# Patient Record
Sex: Female | Born: 1960
Health system: Southern US, Community
[De-identification: ages and names within clinical notes are randomized; demographics above are authoritative.]

## PROBLEM LIST (undated history)

## (undated) DIAGNOSIS — T7840XA Allergy, unspecified, initial encounter: Secondary | ICD-10-CM

## (undated) DIAGNOSIS — Z8739 Personal history of other diseases of the musculoskeletal system and connective tissue: Secondary | ICD-10-CM

## (undated) DIAGNOSIS — Z91018 Allergy to other foods: Secondary | ICD-10-CM

## (undated) DIAGNOSIS — D259 Leiomyoma of uterus, unspecified: Secondary | ICD-10-CM

## (undated) DIAGNOSIS — D649 Anemia, unspecified: Secondary | ICD-10-CM

## (undated) DIAGNOSIS — R1900 Intra-abdominal and pelvic swelling, mass and lump, unspecified site: Secondary | ICD-10-CM

## (undated) DIAGNOSIS — C541 Malignant neoplasm of endometrium: Secondary | ICD-10-CM

## (undated) DIAGNOSIS — R0602 Shortness of breath: Secondary | ICD-10-CM

## (undated) DIAGNOSIS — N838 Other noninflammatory disorders of ovary, fallopian tube and broad ligament: Secondary | ICD-10-CM

## (undated) HISTORY — DX: Leiomyoma of uterus, unspecified: D25.9

## (undated) HISTORY — DX: Allergy, unspecified, initial encounter: T78.40XA

## (undated) HISTORY — DX: Anemia, unspecified: D64.9

## (undated) HISTORY — DX: Allergy to other foods: Z91.018

## (undated) HISTORY — DX: Other noninflammatory disorders of ovary, fallopian tube and broad ligament: N83.8

## (undated) HISTORY — DX: Personal history of other diseases of the musculoskeletal system and connective tissue: Z87.39

## (undated) HISTORY — DX: Shortness of breath: R06.02

## (undated) HISTORY — DX: Intra-abdominal and pelvic swelling, mass and lump, unspecified site: R19.00

---

## 1998-12-26 HISTORY — PX: EYE SURGERY: SHX253

## 2006-06-15 ENCOUNTER — Ambulatory Visit: Payer: Self-pay | Admitting: Urology

## 2007-08-20 ENCOUNTER — Inpatient Hospital Stay: Payer: Self-pay | Admitting: Internal Medicine

## 2009-12-02 DIAGNOSIS — Z8041 Family history of malignant neoplasm of ovary: Secondary | ICD-10-CM | POA: Insufficient documentation

## 2009-12-08 ENCOUNTER — Ambulatory Visit: Payer: Self-pay | Admitting: Family Medicine

## 2009-12-09 DIAGNOSIS — N83209 Unspecified ovarian cyst, unspecified side: Secondary | ICD-10-CM | POA: Insufficient documentation

## 2009-12-09 DIAGNOSIS — D219 Benign neoplasm of connective and other soft tissue, unspecified: Secondary | ICD-10-CM | POA: Insufficient documentation

## 2009-12-16 ENCOUNTER — Ambulatory Visit: Payer: Self-pay | Admitting: Family Medicine

## 2009-12-30 DIAGNOSIS — I369 Nonrheumatic tricuspid valve disorder, unspecified: Secondary | ICD-10-CM | POA: Insufficient documentation

## 2010-01-06 DIAGNOSIS — I8310 Varicose veins of unspecified lower extremity with inflammation: Secondary | ICD-10-CM | POA: Insufficient documentation

## 2010-01-06 DIAGNOSIS — I872 Venous insufficiency (chronic) (peripheral): Secondary | ICD-10-CM | POA: Insufficient documentation

## 2010-03-02 DIAGNOSIS — J309 Allergic rhinitis, unspecified: Secondary | ICD-10-CM | POA: Insufficient documentation

## 2010-05-11 ENCOUNTER — Ambulatory Visit: Payer: Self-pay | Admitting: Family Medicine

## 2015-08-06 ENCOUNTER — Telehealth: Payer: Self-pay | Admitting: Family Medicine

## 2015-08-06 NOTE — Telephone Encounter (Signed)
Fine to schedule. Thanks

## 2015-08-06 NOTE — Telephone Encounter (Signed)
Pt called to get reestablished with our practice. Pt was with Dr. Reginia Forts and was last seen in office 03/02/10. Pt stated she hasn't been seen anywhere else then and has [facility] insurance. Thanks TNP

## 2015-11-24 ENCOUNTER — Encounter: Payer: Self-pay | Admitting: Family Medicine

## 2015-11-24 ENCOUNTER — Ambulatory Visit (INDEPENDENT_AMBULATORY_CARE_PROVIDER_SITE_OTHER): Payer: 59 | Admitting: Family Medicine

## 2015-11-24 VITALS — BP 120/80 | HR 84 | Temp 98.1°F | Resp 16 | Ht 69.0 in | Wt 263.0 lb

## 2015-11-24 DIAGNOSIS — J3089 Other allergic rhinitis: Secondary | ICD-10-CM | POA: Diagnosis not present

## 2015-11-24 DIAGNOSIS — Z889 Allergy status to unspecified drugs, medicaments and biological substances status: Secondary | ICD-10-CM

## 2015-11-24 DIAGNOSIS — IMO0002 Reserved for concepts with insufficient information to code with codable children: Secondary | ICD-10-CM | POA: Insufficient documentation

## 2015-11-24 DIAGNOSIS — I272 Other secondary pulmonary hypertension: Secondary | ICD-10-CM | POA: Diagnosis not present

## 2015-11-24 DIAGNOSIS — M1990 Unspecified osteoarthritis, unspecified site: Secondary | ICD-10-CM

## 2015-11-24 DIAGNOSIS — N920 Excessive and frequent menstruation with regular cycle: Secondary | ICD-10-CM | POA: Insufficient documentation

## 2015-11-24 DIAGNOSIS — I369 Nonrheumatic tricuspid valve disorder, unspecified: Secondary | ICD-10-CM | POA: Diagnosis not present

## 2015-11-24 DIAGNOSIS — Z9109 Other allergy status, other than to drugs and biological substances: Secondary | ICD-10-CM

## 2015-11-24 DIAGNOSIS — T783XXD Angioneurotic edema, subsequent encounter: Secondary | ICD-10-CM

## 2015-11-24 NOTE — Progress Notes (Signed)
Patient ID: Karen Mann, female   DOB: 1961/05/19, 54 y.o.   MRN: [id number]        [name]    DOB: [birth year]   54 y.o.   MRN: [id number] Visit Date: 11/24/2015  Today's Provider: Margarita Rana, MD   Chief Complaint  Patient presents with  . New Patient (Initial Visit)    restablish  . Shortness of Breath   Subjective:    HPI Dyspnea: Patient complains of shortness of breath after one flight stairs.  Symptoms include difficulty breathing and dyspnea on exertion. Symptoms began 2 years ago, unchanged since that time.  Patient denies chest pain. Associated symptoms include dyspnea. Patient has not had recent travel.   Symptoms are exacerbated by walking. Symptoms are alleviated by rest.   Patient reports she has been diagnosed with angioedema by derm Dr. Evorn Gong. Patient does han an appt on 11/26/15 at [facility] for allergy testing. Was positive for Alpha-gal. Can not eat red meat.    Does keep a tight chest.  Does also have allergy testing.   Has not been seen recently.      Allergies  Allergen Reactions  . Penicillins    Previous Medications   CETIRIZINE (ZYRTEC) 10 MG TABLET    Take 10 mg by mouth daily.   EPIPEN 2-PAK 0.3 MG/0.3ML SOAJ INJECTION    INJECT INTO THIGH MUSCLE AS NEEDED FOR ANAPHYLAXIS.    Review of Systems  Constitutional: Positive for fatigue.  HENT: Positive for congestion, facial swelling, hearing loss, sinus pressure and sore throat.   Respiratory: Positive for chest tightness and shortness of breath.   Cardiovascular: Positive for leg swelling.  Genitourinary: Positive for enuresis.  Musculoskeletal: Positive for back pain, joint swelling and arthralgias.  Skin: Positive for rash.  Allergic/Immunologic: Positive for food allergies.    Social History  Substance Use Topics  . Smoking status: Never Smoker   . Smokeless tobacco: Never Used  . Alcohol Use: No   Objective:   BP 120/80 mmHg  Pulse 84  Temp(Src) 98.1 F (36.7  C) (Oral)  Resp 16  Ht 5\' 9"  (1.753 m)  Wt 263 lb (119.296 kg)  BMI 38.82 kg/m2  SpO2 98%  LMP 11/23/2015  Physical Exam  Constitutional: She is oriented to person, place, and time. She appears well-developed and well-nourished.  HENT:  Head: Normocephalic and atraumatic.  Right Ear: External ear normal.  Left Ear: External ear normal.  Mouth/Throat: Oropharynx is clear and moist.  Eyes: Conjunctivae and EOM are normal. Pupils are equal, round, and reactive to light.  Neck: Normal range of motion. Neck supple.  Cardiovascular: Normal rate and regular rhythm.   Pulmonary/Chest: Effort normal and breath sounds normal.  Neurological: She is alert and oriented to person, place, and time.  Psychiatric: She has a normal mood and affect. Her behavior is normal. Judgment and thought content normal.        Assessment & Plan:     1. Secondary pulmonary hypertension ([place]) Has not been checked recently. Will refer to cardiology secondary to SOB, etc.  Will also check labs.   - Comprehensive metabolic panel - Lipid Panel With LDL/HDL Ratio - TSH - Ambulatory referral to Cardiology  2. Other allergic rhinitis Is following up with allergist to have testing and see if can help SOB. Off medication currently.  - CBC with Differential/Platelet  3. Nonrheumatic tricuspid valve disorder Refer as noted.  - Ambulatory referral to Cardiology  4. Angioedema, subsequent encounter  Is continuing work up.   5. Multiple allergies Follow up with allergist as scheduled.   6. Osteoarthritis, unspecified osteoarthritis type, unspecified site Will address after above.         Karen Rana, MD  [facility]

## 2015-11-28 LAB — CBC WITH DIFFERENTIAL/PLATELET
BASOS ABS: 0.1 10*3/uL (ref 0.0–0.2)
Basos: 1 %
EOS (ABSOLUTE): 0.5 10*3/uL — ABNORMAL HIGH (ref 0.0–0.4)
EOS: 6 %
HEMATOCRIT: 34.1 % (ref 34.0–46.6)
HEMOGLOBIN: 11.6 g/dL (ref 11.1–15.9)
IMMATURE GRANS (ABS): 0 10*3/uL (ref 0.0–0.1)
IMMATURE GRANULOCYTES: 0 %
LYMPHS ABS: 2.3 10*3/uL (ref 0.7–3.1)
LYMPHS: 31 %
MCH: 26.6 pg (ref 26.6–33.0)
MCHC: 34 g/dL (ref 31.5–35.7)
MCV: 78 fL — ABNORMAL LOW (ref 79–97)
MONOCYTES: 8 %
Monocytes Absolute: 0.6 10*3/uL (ref 0.1–0.9)
Neutrophils Absolute: 4 10*3/uL (ref 1.4–7.0)
Neutrophils: 54 %
Platelets: 379 10*3/uL (ref 150–379)
RBC: 4.36 x10E6/uL (ref 3.77–5.28)
RDW: 13.5 % (ref 12.3–15.4)
WBC: 7.4 10*3/uL (ref 3.4–10.8)

## 2015-11-28 LAB — LIPID PANEL WITH LDL/HDL RATIO
CHOLESTEROL TOTAL: 207 mg/dL — AB (ref 100–199)
HDL: 66 mg/dL (ref >39)
LDL CALC: 123 mg/dL — AB (ref 0–99)
LDL/HDL RATIO: 1.9 ratio (ref 0.0–3.2)
Triglycerides: 92 mg/dL (ref 0–149)
VLDL CHOLESTEROL CAL: 18 mg/dL (ref 5–40)

## 2015-11-28 LAB — COMPREHENSIVE METABOLIC PANEL
ALBUMIN: 4.2 g/dL (ref 3.5–5.5)
ALK PHOS: 64 IU/L (ref 39–117)
ALT: 20 IU/L (ref 0–32)
AST: 19 IU/L (ref 0–40)
Albumin/Globulin Ratio: 1.6 (ref 1.1–2.5)
BUN / CREAT RATIO: 13 (ref 9–23)
BUN: 12 mg/dL (ref 6–24)
Bilirubin Total: 0.4 mg/dL (ref 0.0–1.2)
CALCIUM: 9.2 mg/dL (ref 8.7–10.2)
CO2: 24 mmol/L (ref 18–29)
CREATININE: 0.91 mg/dL (ref 0.57–1.00)
Chloride: 101 mmol/L (ref 97–106)
GFR calc Af Amer: 83 mL/min/{1.73_m2} (ref >59)
GFR, EST NON AFRICAN AMERICAN: 72 mL/min/{1.73_m2} (ref >59)
GLOBULIN, TOTAL: 2.6 g/dL (ref 1.5–4.5)
GLUCOSE: 81 mg/dL (ref 65–99)
Potassium: 4.6 mmol/L (ref 3.5–5.2)
SODIUM: 141 mmol/L (ref 136–144)
TOTAL PROTEIN: 6.8 g/dL (ref 6.0–8.5)

## 2015-11-28 LAB — TSH: TSH: 1.53 u[IU]/mL (ref 0.450–4.500)

## 2015-11-30 ENCOUNTER — Telehealth: Payer: Self-pay

## 2015-11-30 DIAGNOSIS — M1990 Unspecified osteoarthritis, unspecified site: Secondary | ICD-10-CM

## 2015-11-30 NOTE — Telephone Encounter (Signed)
Pt advised as directed below.  She reported that she "Googled" her symptoms and is worried about Lupus or Lyme's disease.  She is wanted to know if you would agree to ordering labs to check for that.  Her contact Number is [phone].  Thanks,   -Mickel Baas

## 2015-11-30 NOTE — Telephone Encounter (Signed)
LMTCB Emily Drozdowski, CMA  

## 2015-11-30 NOTE — Telephone Encounter (Signed)
-----   Message from Margarita Rana, MD sent at 11/28/2015  7:58 AM EST ----- Labs stable.  Cholesterol is mildly elevated but has high good cholesterol. Blood sugar ok. Follow up with cardiology as discussed. Thanks.

## 2015-11-30 NOTE — Telephone Encounter (Signed)
Ok to order ANA, lyme titers and sed rate. Thanks.

## 2015-11-30 NOTE — Telephone Encounter (Signed)
Pt returned call and request a call back at work. Work # is in Psychologist, counselling. Thanks TNP

## 2015-12-01 DIAGNOSIS — M1990 Unspecified osteoarthritis, unspecified site: Secondary | ICD-10-CM | POA: Insufficient documentation

## 2015-12-01 NOTE — Telephone Encounter (Signed)
Left message to call back  

## 2015-12-01 NOTE — Telephone Encounter (Signed)
Dr. Jerilynn Mages for the Lyme titer, should it say Western Blot? Please advise. Thanks!

## 2015-12-01 NOTE — Telephone Encounter (Signed)
Printed.  Please notify patient. Thanks.  

## 2015-12-03 DIAGNOSIS — R0681 Apnea, not elsewhere classified: Secondary | ICD-10-CM | POA: Insufficient documentation

## 2015-12-03 DIAGNOSIS — R079 Chest pain, unspecified: Secondary | ICD-10-CM | POA: Insufficient documentation

## 2015-12-14 ENCOUNTER — Telehealth: Payer: Self-pay | Admitting: Family Medicine

## 2015-12-14 NOTE — Telephone Encounter (Signed)
Pt is asking if the [facility] insurance form has been faxed back to [facility] showing lab results, weight and blood pressure.  CB#[phone]/MW

## 2015-12-15 NOTE — Telephone Encounter (Signed)
Form was faxed 12/15/2015  Thanks,   -Mickel Baas

## 2015-12-31 ENCOUNTER — Encounter: Payer: Self-pay | Admitting: Family Medicine

## 2015-12-31 ENCOUNTER — Ambulatory Visit (INDEPENDENT_AMBULATORY_CARE_PROVIDER_SITE_OTHER): Payer: 59 | Admitting: Family Medicine

## 2015-12-31 VITALS — BP 120/90 | HR 72 | Temp 98.3°F | Resp 16 | Ht 69.0 in | Wt 262.0 lb

## 2015-12-31 DIAGNOSIS — Z1211 Encounter for screening for malignant neoplasm of colon: Secondary | ICD-10-CM

## 2015-12-31 DIAGNOSIS — R0609 Other forms of dyspnea: Secondary | ICD-10-CM | POA: Diagnosis not present

## 2015-12-31 DIAGNOSIS — Z124 Encounter for screening for malignant neoplasm of cervix: Secondary | ICD-10-CM | POA: Diagnosis not present

## 2015-12-31 DIAGNOSIS — Z0000 Encounter for general adult medical examination without abnormal findings: Secondary | ICD-10-CM

## 2015-12-31 DIAGNOSIS — J3089 Other allergic rhinitis: Secondary | ICD-10-CM | POA: Diagnosis not present

## 2015-12-31 DIAGNOSIS — R0681 Apnea, not elsewhere classified: Secondary | ICD-10-CM

## 2015-12-31 DIAGNOSIS — Z1239 Encounter for other screening for malignant neoplasm of breast: Secondary | ICD-10-CM

## 2015-12-31 MED ORDER — MONTELUKAST SODIUM 10 MG PO TABS
10.0000 mg | ORAL_TABLET | Freq: Every day | ORAL | Status: DC
Start: 2015-12-31 — End: 2018-07-17

## 2015-12-31 NOTE — Patient Instructions (Signed)
Please call the [facility] at [facility] to schedule this at [phone]   

## 2015-12-31 NOTE — Progress Notes (Signed)
Patient ID: ULLA DYSINGER, female   DOB: 08/21/1961, 55 y.o.   MRN: [id number]        Patient: VALI ALDERFER, Female    DOB: 1961/12/01, 55 y.o.   MRN: [id number] Visit Date: 12/31/2015  Today's Provider: Margarita Rana, MD   Chief Complaint  Patient presents with  . Annual Exam   Subjective:    Annual physical exam Takia Montel Sande is a 55 y.o. female who presents today for health maintenance and complete physical. She feels well. She reports exercising 3 days a week. She reports she is sleeping well.  11/30/09 Pap-neg; HPV-neg 12/16/09 Mammogram-BI-RADS 2  Lab Results  Component Value Date   WBC 7.4 11/27/2015   HCT 34.1 11/27/2015   GLUCOSE 81 11/27/2015   CHOL 207* 11/27/2015   TRIG 92 11/27/2015   HDL 66 11/27/2015   LDLCALC 123* 11/27/2015   ALT 20 11/27/2015   AST 19 11/27/2015   NA 141 11/27/2015   K 4.6 11/27/2015   CL 101 11/27/2015   CREATININE 0.91 11/27/2015   BUN 12 11/27/2015   CO2 24 11/27/2015   TSH 1.530 11/27/2015   Still with SOB but improved some.  Did go to see cardiology, and pulmonary hypertension is stable.   Is not the cause of SOB. Does take Zyrtec twice a day.   Has had some recurrence of angioedema.  Etiology unclear still. Has been avoiding red meat. Has increased her cardio at the gym and thinks this has helped.   Does still get tightness in her chest at times.  Still with some runny nose also.   -----------------------------------------------------------------   Review of Systems  Constitutional: Negative.   HENT: Positive for facial swelling and rhinorrhea.   Eyes: Negative.   Respiratory: Positive for chest tightness and shortness of breath.   Cardiovascular: Negative.   Gastrointestinal: Negative.   Endocrine: Negative.   Genitourinary: Positive for urgency and enuresis.  Musculoskeletal: Positive for back pain, joint swelling, arthralgias and gait problem.  Skin: Positive for rash.  Allergic/Immunologic: Positive for food  allergies.  Neurological: Negative.   Hematological: Negative.   Psychiatric/Behavioral: Negative.     Social History      She  reports that she has never smoked. She has never used smokeless tobacco. She reports that she does not drink alcohol or use illicit drugs.       Social History   Social History  . Marital Status: Married    Spouse Name: N/A  . Number of Children: N/A  . Years of Education: N/A   Social History Main Topics  . Smoking status: Never Smoker   . Smokeless tobacco: Never Used  . Alcohol Use: No  . Drug Use: No  . Sexual Activity: Not Asked   Other Topics Concern  . None   Social History Narrative    Past Medical History  Diagnosis Date  . Allergy      Patient Active Problem List   Diagnosis Date Noted  . Chest pain 12/03/2015  . Breathlessness on exertion 12/03/2015  . Arthritis 12/01/2015  . Excessive or frequent menstruation 11/24/2015  . Secondary pulmonary hypertension ([place]) 11/24/2015  . Allergic rhinitis 03/02/2010  . Varicose veins of lower extremities with inflammation 01/06/2010  . Chronic venous insufficiency 01/06/2010  . Nonrheumatic tricuspid valve disorder 12/30/2009  . Cyst of ovary 12/09/2009  . Fibroid 12/09/2009  . Family history of malignant neoplasm of ovary 12/02/2009    Past Surgical History  Procedure Laterality Date  .  Eye surgery  2000    Lasik    Family History        Family Status  Relation Status Death Age  . Father Alive   . Mother Deceased 30    ovarian cancer  . Brother Alive         Her family history includes Healthy in her brother; Hypertension in her father.    Allergies  Allergen Reactions  . Penicillins     Previous Medications   CETIRIZINE (ZYRTEC) 10 MG TABLET    Take 10 mg by mouth 2 (two) times daily.    EPIPEN 2-PAK 0.3 MG/0.3ML SOAJ INJECTION    INJECT INTO THIGH MUSCLE AS NEEDED FOR ANAPHYLAXIS.    Patient Care Team: Margarita Rana, MD as PCP - General (Family Medicine)      Objective:   Vitals: BP 120/90 mmHg  Pulse 72  Temp(Src) 98.3 F (36.8 C) (Oral)  Resp 16  Ht 5\' 9"  (1.753 m)  Wt 262 lb (118.842 kg)  BMI 38.67 kg/m2  LMP 12/15/2015 (Approximate)   Physical Exam  Constitutional: She is oriented to person, place, and time. She appears well-developed and well-nourished.  HENT:  Head: Normocephalic and atraumatic.  Right Ear: Tympanic membrane, external ear and ear canal normal.  Left Ear: Tympanic membrane, external ear and ear canal normal.  Nose: Mucosal edema and rhinorrhea present.  Mouth/Throat: Uvula is midline, oropharynx is clear and moist and mucous membranes are normal.  Eyes: Conjunctivae, EOM and lids are normal. Pupils are equal, round, and reactive to light.  Neck: Trachea normal and normal range of motion. Neck supple. Carotid bruit is not present. No thyroid mass and no thyromegaly present.  Cardiovascular: Normal rate, regular rhythm and normal heart sounds.   Pulmonary/Chest: Effort normal and breath sounds normal.  Abdominal: Soft. Normal appearance and bowel sounds are normal. There is no hepatosplenomegaly. There is no tenderness.  Genitourinary: Uterus normal. No breast swelling, tenderness or discharge. Vaginal discharge found.  Musculoskeletal: Normal range of motion.  Lymphadenopathy:    She has no cervical adenopathy.    She has no axillary adenopathy.  Neurological: She is alert and oriented to person, place, and time. She has normal strength. No cranial nerve deficit.  Skin: Skin is warm, dry and intact.  Psychiatric: She has a normal mood and affect. Her speech is normal and behavior is normal. Judgment and thought content normal. Cognition and memory are normal.     Depression Screen PHQ 2/9 Scores 12/31/2015  PHQ - 2 Score 0      Assessment & Plan:     Routine Health Maintenance and Physical Exam  Exercise Activities and Dietary recommendations Goals    . Exercise 150 minutes per week (moderate  activity)        There is no immunization history on file for this patient.   1. Annual physical exam As above. Encouraged eating healthy and continue to increase exercise.    2. Breast cancer screening Patient will call.  - MM DIGITAL SCREENING BILATERAL; Future  3. Colon cancer screening Will schedule.  - Ambulatory referral to Gastroenterology  4. Cervical cancer screening Done today. Some slight bleeding after PAP. - Pap IG and HPV (high risk) DNA detection  5. Other allergic rhinitis Improved, but not quite at goal. Will try addition of Montelukast.   Further plan pending these changes. Patient will call with progress.   - montelukast (SINGULAIR) 10 MG tablet; Take 1 tablet (10 mg total) by mouth  at bedtime.  Dispense: 30 tablet; Refill: 3  6. Breathlessness on exertion Unclear if asthma, allergies,  Deconditioning or other process.   Will Treat as above and further plan as needed.   Patient was seen and examined by Jerrell Belfast, MD, and note scribed by Lynford Humphrey, [place].   I have reviewed the document for accuracy and completeness and I agree with above. Jerrell Belfast, MD   Margarita Rana, MD   --------------------------------------------------------------------

## 2016-01-06 ENCOUNTER — Telehealth: Payer: Self-pay

## 2016-01-06 LAB — PAP IG AND HPV HIGH-RISK
HPV, high-risk: NEGATIVE
PAP Smear Comment: 0

## 2016-01-06 NOTE — Telephone Encounter (Signed)
LMTCB Emily Drozdowski, CMA  

## 2016-01-06 NOTE — Telephone Encounter (Signed)
-----   Message from Margarita Rana, MD sent at 01/06/2016  1:44 PM EST ----- Pap is normal. Please notify patient.

## 2016-01-07 NOTE — Telephone Encounter (Signed)
Patient advised as below. sd 

## 2017-12-26 DIAGNOSIS — Z9221 Personal history of antineoplastic chemotherapy: Secondary | ICD-10-CM

## 2017-12-26 HISTORY — DX: Personal history of antineoplastic chemotherapy: Z92.21

## 2018-07-17 ENCOUNTER — Ambulatory Visit (INDEPENDENT_AMBULATORY_CARE_PROVIDER_SITE_OTHER): Payer: 59 | Admitting: Family Medicine

## 2018-07-17 ENCOUNTER — Encounter: Payer: Self-pay | Admitting: Family Medicine

## 2018-07-17 VITALS — BP 120/86 | HR 94 | Temp 97.9°F | Resp 16 | Wt 234.0 lb

## 2018-07-17 DIAGNOSIS — R1084 Generalized abdominal pain: Secondary | ICD-10-CM

## 2018-07-17 DIAGNOSIS — D219 Benign neoplasm of connective and other soft tissue, unspecified: Secondary | ICD-10-CM

## 2018-07-17 DIAGNOSIS — Z1211 Encounter for screening for malignant neoplasm of colon: Secondary | ICD-10-CM

## 2018-07-17 DIAGNOSIS — N939 Abnormal uterine and vaginal bleeding, unspecified: Secondary | ICD-10-CM

## 2018-07-17 DIAGNOSIS — D649 Anemia, unspecified: Secondary | ICD-10-CM | POA: Diagnosis not present

## 2018-07-17 MED ORDER — NAPROXEN 500 MG PO TABS: 500.0000 mg | ORAL_TABLET | Freq: Two times a day (BID) | ORAL | 2 refills | Status: DC

## 2018-07-17 NOTE — Patient Instructions (Signed)
Abnormal Uterine Bleeding Abnormal uterine bleeding can affect women at various stages in life, including teenagers, women in their reproductive years, pregnant women, and women who have reached menopause. Several kinds of uterine bleeding are considered abnormal, including:  Bleeding or spotting between periods.  Bleeding after sexual intercourse.  Bleeding that is heavier or more than normal.  Periods that last longer than usual.  Bleeding after menopause. Many cases of abnormal uterine bleeding are minor and simple to treat, while others are more serious. Any type of abnormal bleeding should be evaluated by your health care provider. Treatment will depend on the cause of the bleeding. Follow these instructions at home: Monitor your condition for any changes. The following actions may help to alleviate any discomfort you are experiencing:  Avoid the use of tampons and douches as directed by your health care provider.  Change your pads frequently. You should get regular pelvic exams and Pap tests. Keep all follow-up appointments for diagnostic tests as directed by your health care provider. Contact a health care provider if:  Your bleeding lasts more than 1 week.  You feel dizzy at times. Get help right away if:  You pass out.  You are changing pads every 15 to 30 minutes.  You have abdominal pain.  You have a fever.  You become sweaty or weak.  You are passing large blood clots from the vagina.  You start to feel nauseous and vomit. This information is not intended to replace advice given to you by your health care provider. Make sure you discuss any questions you have with your health care provider. Document Released: 12/12/2005 Document Revised: 05/25/2016 Document Reviewed: 07/11/2013 Elsevier Interactive Patient Education  2017 Elsevier Inc.  

## 2018-07-17 NOTE — Progress Notes (Signed)
[name]    DOB: [birth year]   57 y.o.   MRN: [id number] Visit Date: 07/17/2018  Today's Provider: Lavon Paganini, MD   I, Martha Clan, CMA, am acting as scribe for Lavon Paganini, MD.  Chief Complaint  Patient presents with  . Abdominal Pain   Subjective:    Abdominal Pain  This is a new problem. Episode onset: x 1 week. The pain is located in the generalized abdominal region. The quality of the pain is aching. The abdominal pain radiates to the back. Associated symptoms include anorexia. Pertinent negatives include no arthralgias, belching, constipation, diarrhea, dysuria, fever, flatus, frequency, headaches, hematochezia, hematuria, melena, myalgias, nausea or vomiting. Associated symptoms comments: Vaginal bleeding has been present daily for several months, ranging from light to passing clots.. The pain is aggravated by movement. The pain is relieved by nothing. Treatments tried: Milk of Magnesia. The treatment provided no relief.   Bothered by tightness of abdomen and tenderness diffusely. Really crampy yesterday. Had to leave training session due to cramping.  It is less today.    When she tried milk of magnesia earlier in the week, she did have several bowel movements which gave her little relief, but the pain came right back.  She has had vaginal bleeding for about 6 months.  She rarely has a day where she has no bleeding.  She did not see anyone for this problem as she believes it was her going through menopause.  She does not believe that she has gone a full year without periods prior to this.  She states that the bleeding varies and some days she only used 3 tampons, but other days she will need to change it every hour and have blood clots present.  Previously, she had regular periods every 28 to 30 days a lasted 4 to 5 days at a time.  Earlier this year, she did go few months without any vaginal bleeding.     Allergies  Allergen Reactions  .  Penicillins     No current outpatient medications on file.  Review of Systems  Constitutional: Positive for fatigue. Negative for fever.  HENT: Negative.   Respiratory: Negative.   Cardiovascular: Negative.   Gastrointestinal: Positive for abdominal pain and anorexia. Negative for constipation, diarrhea, flatus, hematochezia, melena, nausea and vomiting.  Genitourinary: Positive for vaginal bleeding. Negative for dysuria, frequency and hematuria.  Musculoskeletal: Negative for arthralgias and myalgias.  Skin: Negative.   Neurological: Negative.  Negative for headaches.  Hematological: Negative.   Psychiatric/Behavioral: Negative.     Social History   Tobacco Use  . Smoking status: Never Smoker  . Smokeless tobacco: Never Used  Substance Use Topics  . Alcohol use: No   Objective:   BP 120/86 (BP Location: Left Arm, Patient Position: Sitting, Cuff Size: Large)   Pulse 94   Temp 97.9 F (36.6 C) (Oral)   Resp 16   Wt 234 lb (106.1 kg)   SpO2 99%   BMI 34.56 kg/m  Vitals:   07/17/18 1041  BP: 120/86  Pulse: 94  Resp: 16  Temp: 97.9 F (36.6 C)  TempSrc: Oral  SpO2: 99%  Weight: 234 lb (106.1 kg)     Physical Exam  Constitutional: She is oriented to person, place, and time. She appears well-developed and well-nourished. She does not appear ill. No distress.  HENT:  Head: Normocephalic and atraumatic.  Mouth/Throat: Oropharynx is clear and moist.  Eyes: Pupils are equal, round,  and reactive to light. EOM are normal. No scleral icterus.  Cardiovascular: Normal rate, regular rhythm, normal heart sounds and intact distal pulses.  No murmur heard. Pulmonary/Chest: Effort normal and breath sounds normal. No respiratory distress. She has no wheezes. She has no rales.  Abdominal: Soft. Normal appearance and bowel sounds are normal. She exhibits no ascites. There is tenderness in the right lower quadrant, suprapubic area and left lower quadrant. There is no rigidity, no  guarding and no CVA tenderness.  Uterus palpable with fundus approx at umbilicus, firm  Genitourinary: Uterus is enlarged.  Genitourinary Comments: Patient declines GYN exam today but understands that she will have to have exam at GYN  Neurological: She is alert and oriented to person, place, and time.  Skin: Skin is warm and dry. Capillary refill takes less than 2 seconds. No rash noted.  Psychiatric: She has a normal mood and affect. Her behavior is normal.  Vitals reviewed.      Assessment & Plan:   Problem List Items Addressed This Visit      Genitourinary   Abnormal uterine bleeding - Primary    Patient with significant vaginal bleeding x6 months She is of an age that she should likely be postmenopausal We will check TSH, CBC, FSH, LH Suspect this is most likely related to fibroids given that her uterus is enlarged and firm at about 20 weeks size as well as known history of fibroids Check ultrasound to characterize further Referral to gynecology for further management      Relevant Orders   CBC (Completed)   Comprehensive metabolic panel (Completed)   FSH/LH (Completed)   TSH (Completed)   US Transvaginal Non-OB   US Pelvis Complete   Ambulatory referral to Gynecology     Other   Fibroid    Review of records reveals fibroids noted in 2010 Ultrasound in 2011 showed 2 uterine fibroids with the largest measuring about 4 cm Given the size of her uterus on exam today, suspect these have enlarged and are causing her abnormal uterine bleeding as well as abdominal pain and cramping Check ultrasound and referral to gynecology as above      Generalized abdominal pain    Suspect this is related to her enlarging uterus Could have been somewhat related to constipation as it did relieve somewhat with milk of magnesia As above, we will evaluate further with ultrasound and refer to gynecology for further management of her fibroids Discussed return precautions      Relevant  Orders   CBC (Completed)   Comprehensive metabolic panel (Completed)   FSH/LH (Completed)   TSH (Completed)   US Transvaginal Non-OB   US Pelvis Complete   Ambulatory referral to Gynecology    Other Visit Diagnoses    Colon cancer screening       Relevant Orders   Cologuard       Return if symptoms worsen or fail to improve.   The entirety of the information documented in the History of Present Illness, Review of Systems and Physical Exam were personally obtained by me. Portions of this information were initially documented by Raquel Sarna Ratchford, CMA and reviewed by me for thoroughness and accuracy.    Virginia Crews, MD, MPH [facility] 07/18/2018 10:33 AM

## 2018-07-18 ENCOUNTER — Telehealth: Payer: Self-pay | Admitting: Obstetrics and Gynecology

## 2018-07-18 DIAGNOSIS — N939 Abnormal uterine and vaginal bleeding, unspecified: Secondary | ICD-10-CM | POA: Insufficient documentation

## 2018-07-18 DIAGNOSIS — R1084 Generalized abdominal pain: Secondary | ICD-10-CM | POA: Insufficient documentation

## 2018-07-18 LAB — COMPREHENSIVE METABOLIC PANEL
A/G RATIO: 1.4 (ref 1.2–2.2)
ALK PHOS: 59 IU/L (ref 39–117)
ALT: 7 IU/L (ref 0–32)
AST: 21 IU/L (ref 0–40)
Albumin: 3.8 g/dL (ref 3.5–5.5)
BILIRUBIN TOTAL: 0.4 mg/dL (ref 0.0–1.2)
BUN/Creatinine Ratio: 9 (ref 9–23)
BUN: 7 mg/dL (ref 6–24)
CALCIUM: 8.8 mg/dL (ref 8.7–10.2)
CHLORIDE: 99 mmol/L (ref 96–106)
CO2: 20 mmol/L (ref 20–29)
Creatinine, Ser: 0.78 mg/dL (ref 0.57–1.00)
GFR calc Af Amer: 98 mL/min/{1.73_m2} (ref >59)
GFR calc non Af Amer: 85 mL/min/{1.73_m2} (ref >59)
Globulin, Total: 2.7 g/dL (ref 1.5–4.5)
Glucose: 75 mg/dL (ref 65–99)
Potassium: 4.4 mmol/L (ref 3.5–5.2)
SODIUM: 137 mmol/L (ref 134–144)
Total Protein: 6.5 g/dL (ref 6.0–8.5)

## 2018-07-18 LAB — FSH/LH
FSH: 6.4 m[IU]/mL
LH: 5.8 m[IU]/mL

## 2018-07-18 LAB — CBC
Hematocrit: 29.2 % — ABNORMAL LOW (ref 34.0–46.6)
Hemoglobin: 8.7 g/dL — ABNORMAL LOW (ref 11.1–15.9)
MCH: 22.1 pg — ABNORMAL LOW (ref 26.6–33.0)
MCHC: 29.8 g/dL — AB (ref 31.5–35.7)
MCV: 74 fL — AB (ref 79–97)
PLATELETS: 575 10*3/uL — AB (ref 150–450)
RBC: 3.93 x10E6/uL (ref 3.77–5.28)
RDW: 16.2 % — AB (ref 12.3–15.4)
WBC: 12 10*3/uL — ABNORMAL HIGH (ref 3.4–10.8)

## 2018-07-18 LAB — TSH: TSH: 1.89 u[IU]/mL (ref 0.450–4.500)

## 2018-07-18 NOTE — Telephone Encounter (Signed)
Patient is schedule 08/02/18 with Dr. Schuman °

## 2018-07-18 NOTE — Telephone Encounter (Signed)
[facility] referring for Abnormal uterine bleeding ,generalized abdominal pain. Prefers female MD. Hulen Skains and left voicemail for patient to call back to be schedule.

## 2018-07-18 NOTE — Assessment & Plan Note (Signed)
Review of records reveals fibroids noted in 2010 Ultrasound in 2011 showed 2 uterine fibroids with the largest measuring about 4 cm Given the size of her uterus on exam today, suspect these have enlarged and are causing her abnormal uterine bleeding as well as abdominal pain and cramping Check ultrasound and referral to gynecology as above

## 2018-07-18 NOTE — Assessment & Plan Note (Signed)
Suspect this is related to her enlarging uterus Could have been somewhat related to constipation as it did relieve somewhat with milk of magnesia As above, we will evaluate further with ultrasound and refer to gynecology for further management of her fibroids Discussed return precautions

## 2018-07-18 NOTE — Assessment & Plan Note (Signed)
Patient with significant vaginal bleeding x6 months She is of an age that she should likely be postmenopausal We will check TSH, CBC, FSH, LH Suspect this is most likely related to fibroids given that her uterus is enlarged and firm at about 20 weeks size as well as known history of fibroids Check ultrasound to characterize further Referral to gynecology for further management

## 2018-07-19 ENCOUNTER — Telehealth: Payer: Self-pay

## 2018-07-19 NOTE — Telephone Encounter (Signed)
-----   Message from Virginia Crews, MD sent at 07/18/2018 11:45 AM EDT ----- Patient's hemoglobin has decreased significantly since last checked.  This is consistent with likely iron deficiency anemia related to her chronic blood loss.  (Please add on an iron panel).  Normal blood sugar, kidney function, liver function, electrolytes, thyroid function.  Hormone levels are not consistent with being postmenopausal.  Brita Romp Dionne Bucy, MD, MPH [facility] 07/18/2018 11:45 AM

## 2018-07-19 NOTE — Telephone Encounter (Signed)
Left message advising pt. OK per DPR. [place] to add on iron panel.

## 2018-07-20 ENCOUNTER — Telehealth: Payer: Self-pay | Admitting: Family Medicine

## 2018-07-20 ENCOUNTER — Telehealth: Payer: Self-pay

## 2018-07-20 DIAGNOSIS — D649 Anemia, unspecified: Secondary | ICD-10-CM

## 2018-07-20 HISTORY — DX: Anemia, unspecified: D64.9

## 2018-07-20 NOTE — Telephone Encounter (Signed)
Left message advising pt. OK per DPR. 

## 2018-07-20 NOTE — Telephone Encounter (Signed)
-----   Message from Virginia Crews, MD sent at 07/20/2018 11:29 AM EDT ----- Iron panel confirms that anemia was due to iron deficiency.  This is related to chronic blood loss.  Patient should start taking ferrous sulfate 325 mg daily.  Iron supplement can cause constipation.  This is best taken with a stool softener such as Colace.  Iron supplement can also cause dark discoloration of the stool.  Do not be alarmed by this.  Virginia Crews, MD, MPH [facility] 07/20/2018 11:29 AM

## 2018-07-20 NOTE — Telephone Encounter (Signed)
Pt stated she was returning Emily's call about her results and request call back. Please advise. Thanks TNP

## 2018-07-21 LAB — IRON AND TIBC
Iron Saturation: 4 % — CL (ref 15–55)
Iron: 11 ug/dL — ABNORMAL LOW (ref 27–159)
Total Iron Binding Capacity: 312 ug/dL (ref 250–450)
UIBC: 301 ug/dL (ref 131–425)

## 2018-07-21 LAB — SPECIMEN STATUS REPORT

## 2018-07-21 LAB — FERRITIN: Ferritin: 60 ng/mL (ref 15–150)

## 2018-07-23 ENCOUNTER — Ambulatory Visit
Admission: RE | Admit: 2018-07-23 | Discharge: 2018-07-23 | Disposition: A | Discharge status: Home / Self Care | Payer: 59 | Source: Ambulatory Visit | Attending: Family Medicine | Admitting: Family Medicine

## 2018-07-23 DIAGNOSIS — R1900 Intra-abdominal and pelvic swelling, mass and lump, unspecified site: Secondary | ICD-10-CM | POA: Insufficient documentation

## 2018-07-23 DIAGNOSIS — D251 Intramural leiomyoma of uterus: Secondary | ICD-10-CM | POA: Diagnosis not present

## 2018-07-23 DIAGNOSIS — N939 Abnormal uterine and vaginal bleeding, unspecified: Secondary | ICD-10-CM | POA: Diagnosis not present

## 2018-07-23 DIAGNOSIS — R1084 Generalized abdominal pain: Secondary | ICD-10-CM | POA: Diagnosis not present

## 2018-07-24 ENCOUNTER — Other Ambulatory Visit: Payer: Self-pay | Admitting: Family Medicine

## 2018-07-24 DIAGNOSIS — R1900 Intra-abdominal and pelvic swelling, mass and lump, unspecified site: Secondary | ICD-10-CM

## 2018-07-24 DIAGNOSIS — N838 Other noninflammatory disorders of ovary, fallopian tube and broad ligament: Secondary | ICD-10-CM

## 2018-07-24 DIAGNOSIS — D259 Leiomyoma of uterus, unspecified: Secondary | ICD-10-CM

## 2018-07-24 DIAGNOSIS — N949 Unspecified condition associated with female genital organs and menstrual cycle: Secondary | ICD-10-CM

## 2018-07-24 DIAGNOSIS — N939 Abnormal uterine and vaginal bleeding, unspecified: Secondary | ICD-10-CM

## 2018-07-24 HISTORY — DX: Leiomyoma of uterus, unspecified: D25.9

## 2018-07-24 HISTORY — DX: Other noninflammatory disorders of ovary, fallopian tube and broad ligament: N83.8

## 2018-07-24 NOTE — Telephone Encounter (Signed)
Dr. B spoke with patient.

## 2018-07-26 ENCOUNTER — Telehealth: Payer: Self-pay | Admitting: Family Medicine

## 2018-07-26 DIAGNOSIS — C541 Malignant neoplasm of endometrium: Secondary | ICD-10-CM

## 2018-07-26 HISTORY — DX: Malignant neoplasm of endometrium: C54.1

## 2018-07-26 MED ORDER — ROPINIROLE HCL 0.25 MG PO TABS: 0.2500 mg | ORAL_TABLET | Freq: Every day | ORAL | 3 refills | Status: DC

## 2018-07-26 NOTE — Telephone Encounter (Signed)
Pt would like Dr. B to call her back/.  She has thought of some questions since she talked to you about her results earlier.  Pt's call back is [phone]  Thanks teri

## 2018-07-26 NOTE — Telephone Encounter (Signed)
Please review

## 2018-07-26 NOTE — Telephone Encounter (Signed)
Patient calls with a few questions about her recent finding on imaging.  She states she was caught off-guard and just thought of some questions  1. She has appts with GYN and GYN onc in the next few weeks 2. She has not heard about scheduling of the MRI - I will check with referral coordinator about this 3. She wonders how long it will take for her to feel better while taking iron - suspect 4-6 weeks 4. She is getting very constipated while taking iron supplement despite taking colace 4 times daily - start Miralax daily and titrate to 1 soft BM daily 5. She is struggling with RLS syndrome for 1-2 hours each night while in bed - start requip 0.25mg  qhs 1-3 hours before bedtime.  Suspect iron deficiency is causing these symptoms and may be able to stop requip once iron is repleted  Virginia Crews, MD, MPH [facility] 07/26/2018 4:07 PM

## 2018-08-02 ENCOUNTER — Encounter: Payer: Self-pay | Admitting: Obstetrics and Gynecology

## 2018-08-02 ENCOUNTER — Ambulatory Visit (INDEPENDENT_AMBULATORY_CARE_PROVIDER_SITE_OTHER): Payer: 59 | Admitting: Obstetrics and Gynecology

## 2018-08-02 ENCOUNTER — Other Ambulatory Visit (HOSPITAL_COMMUNITY)
Admission: RE | Admit: 2018-08-02 | Discharge: 2018-08-02 | Disposition: A | Discharge status: Home / Self Care | Payer: 59 | Source: Ambulatory Visit | Attending: Obstetrics and Gynecology | Admitting: Obstetrics and Gynecology

## 2018-08-02 VITALS — BP 130/76 | HR 90 | Ht 69.0 in | Wt 238.5 lb

## 2018-08-02 DIAGNOSIS — N839 Noninflammatory disorder of ovary, fallopian tube and broad ligament, unspecified: Secondary | ICD-10-CM

## 2018-08-02 DIAGNOSIS — C541 Malignant neoplasm of endometrium: Secondary | ICD-10-CM | POA: Diagnosis not present

## 2018-08-02 DIAGNOSIS — N838 Other noninflammatory disorders of ovary, fallopian tube and broad ligament: Secondary | ICD-10-CM

## 2018-08-02 DIAGNOSIS — R1900 Intra-abdominal and pelvic swelling, mass and lump, unspecified site: Secondary | ICD-10-CM | POA: Diagnosis not present

## 2018-08-02 NOTE — Progress Notes (Signed)
Patient ID: Karen Mann, female   DOB: March 15, 1961, 57 y.o.   MRN: [id number]  Reason for Consult: Refferal (x6 months bleeding, stopped for about a week then returned today )   Referred by Virginia Crews, MD  Subjective:     HPI:  Karen Mann is a 57 y.o. female.  [name] is following up today to establish care.  She was referred by her primary care physician because of a large abdominal mass that was seen on pelvic ultrasound.  The mass is suspicious for ovarian cancer.  [name] reports that her symptoms of this mass began in July.  She reports that on July 15 she had some bloating.  On July 20 she attended a painting class after which she was extremely fatigued.  She noticed some constipation and took milk of magnesia which did not help.  She saw her primary care physician on the 23rd and had her ultrasound performed on the 29th.  On the 30th her primary care physician called her about the ultrasound results.  The ultrasound showed a enlarged pelvic mass measuring 20 x 13 x 22 cm with cystic and solid components.  The ultrasound also showed a large 4 to 5 cm fibroid and thickened endometrium.  Past Medical History:  Diagnosis Date  . Allergy   . Anemia 07/20/2018  . Fibroid uterus 07/24/2018   5cm/2 inch   . Ovarian mass 07/24/2018   22cm/10 inch   Family History  Problem Relation Age of Onset  . Hypertension Father   . Healthy Brother    Past Surgical History:  Procedure Laterality Date  . EYE SURGERY  2000   Lasik    Short Social History:  Social History   Tobacco Use  . Smoking status: Never Smoker  . Smokeless tobacco: Never Used  Substance Use Topics  . Alcohol use: No    Allergies  Allergen Reactions  . Penicillins     Current Outpatient Medications  Medication Sig Dispense Refill  . ferrous sulfate 325 (65 FE) MG tablet Take 325 mg by mouth daily with breakfast.    . naproxen (NAPROSYN) 500 MG tablet Take 1 tablet (500 mg total) by mouth 2 (two)  times daily with a meal. 60 tablet 2  . Polyethylene Glycol 3350 (MIRALAX PO) Take by mouth.    Marland Kitchen rOPINIRole (REQUIP) 0.25 MG tablet Take 1 tablet (0.25 mg total) by mouth at bedtime. Take 1-3 hours before bedtime 30 tablet 3   No current facility-administered medications for this visit.     Review of Systems  Constitutional: Positive for fatigue. Negative for chills, fever and unexpected weight change.  HENT: Negative for trouble swallowing.  Eyes: Negative for loss of vision.  Respiratory: Negative for cough, shortness of breath and wheezing.  Cardiovascular: Negative for chest pain, leg swelling, palpitations and syncope.  GI: Positive for abdominal pain. Negative for blood in stool, diarrhea, nausea and vomiting.       Constipation GU: Negative for difficulty urinating, dysuria, frequency and hematuria.  Musculoskeletal: Negative for back pain, leg pain and joint pain.  Skin: Negative for rash.  Neurological: Negative for dizziness, headaches, light-headedness, numbness and seizures.  Psychiatric: Negative for behavioral problem, confusion, depressed mood and sleep disturbance.        Objective:  Objective   Vitals:   08/02/18 1535  BP: 130/76  Pulse: 90  Weight: 238 lb 8 oz (108.2 kg)  Height: 5\' 9"  (1.753 m)   Body mass index is  35.22 kg/m.  Physical Exam  Constitutional: She is oriented to person, place, and time. She appears well-developed and well-nourished.  Pale  HENT:  Head: Normocephalic and atraumatic.  Eyes: Pupils are equal, round, and reactive to light. EOM are normal.  Cardiovascular: Normal rate and regular rhythm.  Pulmonary/Chest: Effort normal. No respiratory distress.  Abdominal: She exhibits distension and mass. There is no tenderness. There is no rebound and no guarding. No hernia.  Significant distension and palpation of a mass at the level of the sternum, 30+cm from the pubic bone.   Genitourinary:  Genitourinary Comments: Cervix unable to be  seen because of the anterior angle. Large pelvic/abdominal mass.  Musculoskeletal: She exhibits edema. She exhibits no tenderness or deformity.  Bilateral pitting edema  Neurological: She is alert and oriented to person, place, and time.  Skin: Skin is warm and dry. There is pallor.  Psychiatric: She has a normal mood and affect. Her behavior is normal. Judgment and thought content normal.  Nursing note and vitals reviewed.   Endometrial Biopsy After discussion with the patient regarding her abnormal uterine bleeding I recommended that she proceed with an endometrial biopsy for further diagnosis. The risks, benefits, alternatives, and indications for an endometrial biopsy were discussed with the patient in detail. She understood the risks including infection, bleeding, cervical laceration and uterine perforation.  Verbal consent was obtained.   PROCEDURE NOTE: The cervix was significantly angled anterior.  This is likely related to the large pelvic mass.  For this reason the endometrial biopsy was performed by palpation of the cervix with introduction of the endometrial Pipelle through the cervix and into the uterus.  Adequate specimen was obtained with minimal blood loss.  The patient tolerated the procedure well.  Disposition will be pending pathology.     Assessment/Plan:     57 yo with an enlarged pelvic mass concerning for endometrial cancer with signs of bilateral lower extremity swelling with mass extending close to her sternum. Endometrial biopsy performed today.  She was provided with information on ovarian cancer from [facility] and [facility]. She will return tomorrow for a lab draw of  CA125. She already has follow up with GYN ONC scheduled for next week.   More than 45 minutes were spent face to face with the patient in the room with more than 50% of the time spent providing counseling and discussing the plan of management. We discussed plan of treatment.    Adrian Prows MD [facility] 08/06/18 6:35 PM

## 2018-08-03 ENCOUNTER — Telehealth: Payer: Self-pay | Admitting: *Deleted

## 2018-08-03 ENCOUNTER — Other Ambulatory Visit: Payer: 59

## 2018-08-03 NOTE — Telephone Encounter (Signed)
Patient called- returning Colette's phone call regarding her apt. She confirmed that she would be willing to change her apt to 11:15 next Wednesday.

## 2018-08-05 ENCOUNTER — Ambulatory Visit
Admission: RE | Admit: 2018-08-05 | Discharge: 2018-08-05 | Disposition: A | Discharge status: Home / Self Care | Payer: 59 | Source: Ambulatory Visit | Attending: Family Medicine | Admitting: Family Medicine

## 2018-08-05 DIAGNOSIS — N1330 Unspecified hydronephrosis: Secondary | ICD-10-CM | POA: Diagnosis not present

## 2018-08-05 DIAGNOSIS — N1339 Other hydronephrosis: Secondary | ICD-10-CM | POA: Diagnosis not present

## 2018-08-05 DIAGNOSIS — N949 Unspecified condition associated with female genital organs and menstrual cycle: Secondary | ICD-10-CM

## 2018-08-05 DIAGNOSIS — R188 Other ascites: Secondary | ICD-10-CM | POA: Insufficient documentation

## 2018-08-05 DIAGNOSIS — N939 Abnormal uterine and vaginal bleeding, unspecified: Secondary | ICD-10-CM | POA: Diagnosis not present

## 2018-08-05 MED ORDER — GADOBENATE DIMEGLUMINE 529 MG/ML IV SOLN
20.0000 mL | Freq: Once | INTRAVENOUS | Status: AC | PRN
  Administered 2018-08-05: 20 mL via INTRAVENOUS

## 2018-08-06 ENCOUNTER — Other Ambulatory Visit: Payer: 59

## 2018-08-06 DIAGNOSIS — N838 Other noninflammatory disorders of ovary, fallopian tube and broad ligament: Secondary | ICD-10-CM

## 2018-08-06 NOTE — Progress Notes (Signed)
Called and discussed result with the patient. She is seeing gynecologic oncology on 08/08/18

## 2018-08-07 LAB — CA 125: Cancer Antigen (CA) 125: 622 U/mL — ABNORMAL HIGH (ref 0.0–38.1)

## 2018-08-08 ENCOUNTER — Other Ambulatory Visit: Payer: Self-pay

## 2018-08-08 ENCOUNTER — Inpatient Hospital Stay: Payer: 59 | Attending: Obstetrics and Gynecology | Admitting: Obstetrics and Gynecology

## 2018-08-08 ENCOUNTER — Telehealth: Payer: Self-pay | Admitting: *Deleted

## 2018-08-08 ENCOUNTER — Inpatient Hospital Stay: Payer: 59

## 2018-08-08 ENCOUNTER — Encounter: Payer: Self-pay | Admitting: *Deleted

## 2018-08-08 VITALS — BP 116/79 | HR 97 | Temp 97.6°F | Resp 18 | Ht 69.0 in | Wt 236.0 lb

## 2018-08-08 DIAGNOSIS — Z79899 Other long term (current) drug therapy: Secondary | ICD-10-CM | POA: Insufficient documentation

## 2018-08-08 DIAGNOSIS — Z8041 Family history of malignant neoplasm of ovary: Secondary | ICD-10-CM | POA: Insufficient documentation

## 2018-08-08 DIAGNOSIS — R188 Other ascites: Secondary | ICD-10-CM

## 2018-08-08 DIAGNOSIS — C541 Malignant neoplasm of endometrium: Secondary | ICD-10-CM | POA: Diagnosis not present

## 2018-08-08 DIAGNOSIS — G4700 Insomnia, unspecified: Secondary | ICD-10-CM | POA: Diagnosis not present

## 2018-08-08 DIAGNOSIS — M7989 Other specified soft tissue disorders: Secondary | ICD-10-CM | POA: Insufficient documentation

## 2018-08-08 DIAGNOSIS — D500 Iron deficiency anemia secondary to blood loss (chronic): Secondary | ICD-10-CM | POA: Diagnosis not present

## 2018-08-08 DIAGNOSIS — J90 Pleural effusion, not elsewhere classified: Secondary | ICD-10-CM | POA: Insufficient documentation

## 2018-08-08 DIAGNOSIS — F419 Anxiety disorder, unspecified: Secondary | ICD-10-CM | POA: Diagnosis not present

## 2018-08-08 DIAGNOSIS — Z5111 Encounter for antineoplastic chemotherapy: Secondary | ICD-10-CM | POA: Insufficient documentation

## 2018-08-08 DIAGNOSIS — R5383 Other fatigue: Secondary | ICD-10-CM | POA: Diagnosis not present

## 2018-08-08 DIAGNOSIS — N1330 Unspecified hydronephrosis: Secondary | ICD-10-CM | POA: Diagnosis not present

## 2018-08-08 DIAGNOSIS — D259 Leiomyoma of uterus, unspecified: Secondary | ICD-10-CM | POA: Diagnosis not present

## 2018-08-08 DIAGNOSIS — Z006 Encounter for examination for normal comparison and control in clinical research program: Secondary | ICD-10-CM | POA: Insufficient documentation

## 2018-08-08 DIAGNOSIS — R1900 Intra-abdominal and pelvic swelling, mass and lump, unspecified site: Secondary | ICD-10-CM

## 2018-08-08 DIAGNOSIS — N939 Abnormal uterine and vaginal bleeding, unspecified: Secondary | ICD-10-CM

## 2018-08-08 LAB — COMPREHENSIVE METABOLIC PANEL
ALBUMIN: 3.3 g/dL — AB (ref 3.5–5.0)
ALT: 7 U/L (ref 0–44)
AST: 16 U/L (ref 15–41)
Alkaline Phosphatase: 60 U/L (ref 38–126)
Anion gap: 15 (ref 5–15)
BUN: 11 mg/dL (ref 6–20)
CO2: 20 mmol/L — AB (ref 22–32)
Calcium: 8.6 mg/dL — ABNORMAL LOW (ref 8.9–10.3)
Chloride: 102 mmol/L (ref 98–111)
Creatinine, Ser: 0.82 mg/dL (ref 0.44–1.00)
GFR calc Af Amer: >60 mL/min (ref >60)
GFR calc non Af Amer: >60 mL/min (ref >60)
GLUCOSE: 88 mg/dL (ref 70–99)
POTASSIUM: 4 mmol/L (ref 3.5–5.1)
SODIUM: 137 mmol/L (ref 135–145)
Total Bilirubin: 0.9 mg/dL (ref 0.3–1.2)
Total Protein: 7.1 g/dL (ref 6.5–8.1)

## 2018-08-08 LAB — CBC WITH DIFFERENTIAL/PLATELET
Basophils Absolute: 0.2 10*3/uL — ABNORMAL HIGH (ref 0–0.1)
Basophils Relative: 1 %
EOS ABS: 0.7 10*3/uL (ref 0–0.7)
Eosinophils Relative: 6 %
HCT: 27.7 % — ABNORMAL LOW (ref 35.0–47.0)
Hemoglobin: 8.5 g/dL — ABNORMAL LOW (ref 12.0–16.0)
Lymphocytes Relative: 16 %
Lymphs Abs: 1.8 10*3/uL (ref 1.0–3.6)
MCH: 22 pg — ABNORMAL LOW (ref 26.0–34.0)
MCHC: 30.8 g/dL — ABNORMAL LOW (ref 32.0–36.0)
MCV: 71.4 fL — ABNORMAL LOW (ref 80.0–100.0)
MONO ABS: 0.6 10*3/uL (ref 0.2–0.9)
MONOS PCT: 6 %
Neutro Abs: 8 10*3/uL — ABNORMAL HIGH (ref 1.4–6.5)
Neutrophils Relative %: 71 %
Platelets: 706 10*3/uL — ABNORMAL HIGH (ref 150–440)
RBC: 3.88 MIL/uL (ref 3.80–5.20)
RDW: 17.2 % — AB (ref 11.5–14.5)
WBC: 11.3 10*3/uL — ABNORMAL HIGH (ref 3.6–11.0)

## 2018-08-08 NOTE — Progress Notes (Signed)
Pt bleeding every day-some days are light and maybe 2 tampons day and other days she is every hour using tampon.  She had one week last week with no bleeding and the bleeding has been going on for 5-6 months. She gets sob on exertion and sometimes has pain in abdomen, right groin and back but today she states it is pressure feeling. She is tired all the time

## 2018-08-08 NOTE — Telephone Encounter (Signed)
Called pt just to let her know in case she uses my chart that her ct scan is still scheduled for tom. The authorization was just approving the chest only and later today it got approved for abdomen. The appt was changed to next week but once both authorization was achieved then it was moved back. I was not sure if pt had been contacted and wanted the  Communication to be clear that it is still tomorrow at 10:30 and she should not eat or drink for 4 hours prior to scan. She picked up the prep kit today and she knows where to go

## 2018-08-08 NOTE — Progress Notes (Addendum)
Gynecologic Oncology Consult Visit   Referring Provider:  Dr. Gilman Schmidt  Chief Complaint: Endometrial cancer & Pelvic Mass  Subjective:  Karen Mann is a 57 y.o. female who is seen in consultation from Dr. Gilman Schmidt for abnormal uterine bleeding with biopsy showing cancer and pelvic masses.   Patient initially referred to Dr. Gilman Schmidt by PCP, Dr. Brita Romp, d/t large abdominal mass seen on pelvic ultrasound concerning for ovarian cancer. Patient first noticed bloating, fatigue, and constipation in July 2019. She presented to PCP. FSH 6.4, LH 5.8.   07/23/18- Ultrasound was performed which showed: pelvic mass measuring 20 x 13 x 22 cm with cystic and solid components. Ultrasound also showed: 4-5cm fibroid and thickened endometrium of 25m  08/05/18- MR PELVIS W WO CONTRAST - 20.6 cm (17.1 x 20.6 x 20.0 cm) complex cystic/solid mass in the pelvis w/ numerous thickened/enhancing septations, compatible with malignant ovarian neoplasm. Neither ovary is discretely visualized. - Small volume abdominopelvic ascites. Gross peritoneal disease is not visualized but cannot be excluded. - Left hydroureteronephrosis secondary to extrinsic compression. - 5.3 intramural anterior uterine body fibroid  On exam, Dr. SGilman Schmidtnoted bilateral lower extremity swelling with mass extending close to sternum, ~ 30 cm. Endometrial biopsy was performed. Pathology: Endometrioid Adenocarcinoma consistent with FIGO grade 3  CA 125  08/06/2018 622.0  Today, she reports continued vaginal bleeding for past 5-6 months fluctuating from light to heavy (saturating tampon every hour). Found to be significantly anemic and started on iron pills. Also is having significant dyspnea.    She reports her mother died of ovarian cancer at age 19886(diagnosed at age 19841.   Problem List: Patient Active Problem List   Diagnosis Date Noted  . Pelvic mass 08/08/2018  . Abnormal uterine bleeding (AUB) 07/18/2018  . Generalized abdominal pain  07/18/2018  . Chest pain 12/03/2015  . Breathlessness on exertion 12/03/2015  . Arthritis 12/01/2015  . Secondary pulmonary hypertension 11/24/2015  . Allergic rhinitis 03/02/2010  . Varicose veins of lower extremities with inflammation 01/06/2010  . Chronic venous insufficiency 01/06/2010  . Nonrheumatic tricuspid valve disorder 12/30/2009  . Cyst of ovary 12/09/2009  . Fibroid 12/09/2009  . Family history of malignant neoplasm of ovary 12/02/2009    Past Medical History: Past Medical History:  Diagnosis Date  . Allergy   . Anemia 07/20/2018  . Fibroid uterus 07/24/2018   5cm/2 inch   . Ovarian mass 07/24/2018   22cm/10 inch  . Pelvic mass     Past Surgical History: Past Surgical History:  Procedure Laterality Date  . EYE SURGERY  2000   Lasik    Past Gynecologic History:  G1P1 Age of Menarche: 89Regular menstrual periods Denies abnormal pap smears Denies history of STDs  OB History:  OB History  Gravida Para Term Preterm AB Living  1 1 1     1   SAB TAB Ectopic Multiple Live Births          1    # Outcome Date GA Lbr Len/2nd Weight Sex Delivery Anes PTL Lv  1 Term 09/03/85 [id number] lb 2.5 oz (4.153 kg) M Vag-Spont   LIV    Family History: Family History  Problem Relation Age of Onset  . Hypertension Father   . Healthy Brother     Social History: Social History   Socioeconomic History  . Marital status: Married    Spouse name: Not on file  . Number of children: Not on file  . Years of education: Not  on file  . Highest education level: Not on file  Occupational History  . Not on file  Social Needs  . Financial resource strain: Not on file  . Food insecurity:    Worry: Not on file    Inability: Not on file  . Transportation needs:    Medical: Not on file    Non-medical: Not on file  Tobacco Use  . Smoking status: Never Smoker  . Smokeless tobacco: Never Used  Substance and Sexual Activity  . Alcohol use: No  . Drug use: No  . Sexual  activity: Not Currently    Birth control/protection: Post-menopausal  Lifestyle  . Physical activity:    Days per week: Not on file    Minutes per session: Not on file  . Stress: Not on file  Relationships  . Social connections:    Talks on phone: Not on file    Gets together: Not on file    Attends religious service: Not on file    Active member of club or organization: Not on file    Attends meetings of clubs or organizations: Not on file    Relationship status: Not on file  . Intimate partner violence:    Fear of current or ex partner: Not on file    Emotionally abused: Not on file    Physically abused: Not on file    Forced sexual activity: Not on file  Other Topics Concern  . Not on file  Social History Narrative  . Not on file    Allergies: Allergies  Allergen Reactions  . Penicillins     Current Medications: Current Outpatient Medications  Medication Sig Dispense Refill  . ferrous sulfate 325 (65 FE) MG tablet Take 325 mg by mouth daily with breakfast.    . naproxen (NAPROSYN) 500 MG tablet Take 1 tablet (500 mg total) by mouth 2 (two) times daily with a meal. 60 tablet 2  . Polyethylene Glycol 3350 (MIRALAX PO) Take by mouth.    Marland Kitchen rOPINIRole (REQUIP) 0.25 MG tablet Take 1 tablet (0.25 mg total) by mouth at bedtime. Take 1-3 hours before bedtime 30 tablet 3   No current facility-administered medications for this visit.     Review of Systems General: positive for night sweats and weakness. Positive for fatigue Skin: negative for changes in color, texture, moles or lesions Eyes: negative for changes in vision, pain, diplopia HEENT: negative for change in hearing, pain, discharge, tinnitus, vertigo, voice changes, sore throat, neck masses Breasts: negative for breast lumps Pulmonary: positive for shortness of breath Cardiac: negative for palpitations, syncope, pain, discomfort, pressure Gastrointestinal: negative for dysphagia, nausea, vomiting, jaundice, pain,  diarrhea, hematemesis, hematochezia. Positive for constipation.  Genitourinary/Sexual: negative for dysuria, discharge, hesitancy, nocturia, retention, stones, infections, STD's, incontinence Ob/Gyn: Positive for vaginal bleeding Musculoskeletal: negative for pain, stiffness, swelling, range of motion limitation Hematology: negative for easy bruising, bleeding (abnormal vaginal bleeding) Neurologic/Psych: negative for headaches, seizures, paralysis, tremor, change in gait, change in sensation, mood swings, depression, anxiety, change in memory.   Objective:  Physical Examination:  BP 116/79   Pulse 97   Temp 97.6 F (36.4 C) (Tympanic)   Resp 18   Ht 5' 9"  (1.753 m)   Wt 236 lb (107 kg)   BMI 34.85 kg/m     ECOG Performance Status: 1 - Symptomatic but completely ambulatory  GENERAL: Patient is a well appearing female in no acute distress HEENT:  Sclerae anicteric.  Oropharynx clear and moist. No ulcerations or  evidence of oropharyngeal candidiasis. Neck is supple.  NODES:  No cervical, supraclavicular, or axillary lymphadenopathy palpated.  LUNGS:  Clear to auscultation on left.  Decreased breath sounds at right base.  No wheezes or rhonchi. HEART:  Regular rate and rhythm. No murmur appreciated. ABDOMEN:  Large abdominal mass palpable.  Positive, normoactive bowel sounds. No organomegaly palpated. MSK:  No focal spinal tenderness to palpation. Full range of motion bilaterally in the upper extremities. EXTREMITIES:  1-2+ bilateral peripheral edema.   SKIN:  Clear with no obvious rashes or skin changes. No nail dyscrasia. NEURO:  Nonfocal. Well oriented.  Appropriate affect. BREAST: Breasts appear normal, no suspicious masses, no skin or nipple changes or axillary nodes  Pelvic: Exam Chaperoned by RN EGBUS: no lesions Cervix: no lesions, nontender, mobile Vagina: no lesions, no discharge. Small amount of blood in vault. Bimanual: large mass filling pelvis and lower  abdomen Rectovaginal: confirmatory    Lab Review CBC    Component Value Date/Time   WBC 11.3 (H) 08/08/2018 1240   RBC 3.88 08/08/2018 1240   HGB 8.5 (L) 08/08/2018 1240   HGB 8.7 (L) 07/17/2018 1124   HCT 27.7 (L) 08/08/2018 1240   HCT 29.2 (L) 07/17/2018 1124   PLT 706 (H) 08/08/2018 1240   PLT 575 (H) 07/17/2018 1124   MCV 71.4 (L) 08/08/2018 1240   MCV 74 (L) 07/17/2018 1124   MCH 22.0 (L) 08/08/2018 1240   MCHC 30.8 (L) 08/08/2018 1240   RDW 17.2 (H) 08/08/2018 1240   RDW 16.2 (H) 07/17/2018 1124   LYMPHSABS 1.8 08/08/2018 1240   LYMPHSABS 2.3 11/27/2015 1008   MONOABS 0.6 08/08/2018 1240   EOSABS 0.7 08/08/2018 1240   EOSABS 0.5 (H) 11/27/2015 1008   BASOSABS 0.2 (H) 08/08/2018 1240   BASOSABS 0.1 11/27/2015 1008   CMP Latest Ref Rng & Units 08/08/2018 07/17/2018 11/27/2015  Glucose 70 - 99 mg/dL 88 75 81  BUN 6 - 20 mg/dL 11 7 12   Creatinine 0.44 - 1.00 mg/dL 0.82 0.78 0.91  Sodium 135 - 145 mmol/L 137 137 141  Potassium 3.5 - 5.1 mmol/L 4.0 4.4 4.6  Chloride 98 - 111 mmol/L 102 99 101  CO2 22 - 32 mmol/L 20(L) 20 24  Calcium 8.9 - 10.3 mg/dL 8.6(L) 8.8 9.2  Total Protein 6.5 - 8.1 g/dL 7.1 6.5 6.8  Total Bilirubin 0.3 - 1.2 mg/dL 0.9 0.4 0.4  Alkaline Phos 38 - 126 U/L 60 59 64  AST 15 - 41 U/L 16 21 19   ALT 0 - 44 U/L 7 7 20        Assessment:  ANNSLEY AKKERMAN is a 58 y.o. female diagnosed with grade 3 endometrioid endometrial adenocarcinoma and large pelvic masses that likely represent ovarian metastases.  Pelvic MRI did not show any metastatic disease, but I suspect that she has a large right pleural effusion.  She is also anemic and is taking iron supplementation.   Mother died of ovarian cancer at age 40.  Plan:   Problem List Items Addressed This Visit      Genitourinary   Abnormal uterine bleeding (AUB)     Other   Pelvic mass    Other Visit Diagnoses    Endometrial adenocarcinoma ([place])    -  Primary   Relevant Orders   CT Abdomen Pelvis W  Contrast   CT Chest W Contrast   CBC with Differential/Platelet   Comprehensive metabolic panel   CA 754     We discussed  options for management including CT scan chest, abdomen and pelvis to better assess for metastatic disease.  If she has a large pleural effusion this will be drained for symptomatic relief and cytology sent.  If she has stage IV disease it may be best to start treatment with systemic therapy.  This usually involves carboplatin and taxol.  We also have a trial for advanced endometrial cancer with Pembrolizumab and Levantinib that is not dependent on MSI status.  If CT does not show disease beyond the uterus and ovaries can consider surgery for primary resection and will want to check MMR status on this tissue (or the endometrial biopsy if surgery not done) to see if she would qualify for immunotherapy.   The patient's diagnosis, an outline of the further diagnostic and laboratory studies which will be required, the recommendation for surgery, and alternatives were discussed with her and her accompanying family members.  All questions were answered to their satisfaction.  Will also need to get genetic testing at some point soon in view of family history.   A total of 60 minutes were spent with the patient/family today; 40% was spent in education, counseling and coordination of care for uterine cancer.    Karen Au, NP  I personally interviewed and examined the patient. Agreed with the above/below plan of care. Patient/family questions were answered.  Mellody Drown, MD   CC:  Brita Romp Dionne Bucy, [address] [place] [place], [place] [address] [phone]

## 2018-08-09 ENCOUNTER — Telehealth: Payer: Self-pay | Admitting: Nurse Practitioner

## 2018-08-09 ENCOUNTER — Ambulatory Visit
Admission: RE | Admit: 2018-08-09 | Discharge: 2018-08-09 | Disposition: A | Discharge status: Home / Self Care | Payer: 59 | Source: Ambulatory Visit | Attending: Nurse Practitioner | Admitting: Nurse Practitioner

## 2018-08-09 ENCOUNTER — Other Ambulatory Visit: Payer: Self-pay | Admitting: Nurse Practitioner

## 2018-08-09 ENCOUNTER — Ambulatory Visit: Admission: RE | Admit: 2018-08-09 | Payer: 59 | Source: Ambulatory Visit

## 2018-08-09 ENCOUNTER — Other Ambulatory Visit: Payer: Self-pay | Admitting: *Deleted

## 2018-08-09 DIAGNOSIS — J90 Pleural effusion, not elsewhere classified: Secondary | ICD-10-CM

## 2018-08-09 DIAGNOSIS — K8020 Calculus of gallbladder without cholecystitis without obstruction: Secondary | ICD-10-CM | POA: Insufficient documentation

## 2018-08-09 DIAGNOSIS — C541 Malignant neoplasm of endometrium: Secondary | ICD-10-CM

## 2018-08-09 DIAGNOSIS — M12852 Other specific arthropathies, not elsewhere classified, left hip: Secondary | ICD-10-CM | POA: Diagnosis not present

## 2018-08-09 DIAGNOSIS — M12851 Other specific arthropathies, not elsewhere classified, right hip: Secondary | ICD-10-CM | POA: Insufficient documentation

## 2018-08-09 DIAGNOSIS — M47816 Spondylosis without myelopathy or radiculopathy, lumbar region: Secondary | ICD-10-CM | POA: Insufficient documentation

## 2018-08-09 DIAGNOSIS — R188 Other ascites: Secondary | ICD-10-CM | POA: Insufficient documentation

## 2018-08-09 DIAGNOSIS — N1330 Unspecified hydronephrosis: Secondary | ICD-10-CM | POA: Insufficient documentation

## 2018-08-09 HISTORY — DX: Malignant neoplasm of endometrium: C54.1

## 2018-08-09 LAB — CA 125: CANCER ANTIGEN (CA) 125: 692 U/mL — AB (ref 0.0–38.1)

## 2018-08-09 MED ORDER — IOPAMIDOL (ISOVUE-300) INJECTION 61%
100.0000 mL | Freq: Once | INTRAVENOUS | Status: AC | PRN
  Administered 2018-08-09: 100 mL via INTRAVENOUS

## 2018-08-09 NOTE — Telephone Encounter (Signed)
Called patient with results of CT scans. I've discussed and forwarded these results and images to Dr. Fransisca Connors as well. Will proceed with thoracentesis tomorrow with cytology to assess if malignant effusion. If malignant, will refer to medical-oncology for treatment planning.    IMPRESSION: 1. A large cystic and solid mass likely arising from the ovaries measures 3690 cubic cm, suspicious for ovarian malignancy. Moderate ascites noted, raising concern for malignant peritoneal spread, but without well-defined omental caking or solid tumor deposits along the ascites. 2. Large right pleural effusion, over half of right hemithoracic volume. Likewise this may raise concern for malignant pleural effusion, but there is no enhancement along the pleural surface or nodularity observed. The right pleural effusion is nonspecific for transudative or exudative etiology. 3. Indistinct hypodensity in the liver just above the gallbladder fossa measuring 2.8 by 2.8 cm. This could reflect some mild focal fatty infiltration but is technically nonspecific. If clinically warranted, hepatic protocol MRI with and without contrast could be utilized for further characterization. Alternatively, PET-CT could provide complementary information. 4. There is moderate left and mild right hydronephrosis attributed to ureteral compression due to the mass. 5. Other imaging findings of potential clinical significance: Thoracolumbar spondylosis with impingement at L4-5. Cholelithiasis. Scattered scarring in the left kidney with mild scarring in the right kidney. Severe right and moderate left degenerative hip arthropathy.

## 2018-08-10 ENCOUNTER — Other Ambulatory Visit: Payer: Self-pay

## 2018-08-10 ENCOUNTER — Other Ambulatory Visit: Payer: Self-pay | Admitting: Nurse Practitioner

## 2018-08-10 ENCOUNTER — Encounter (INDEPENDENT_AMBULATORY_CARE_PROVIDER_SITE_OTHER): Payer: Self-pay

## 2018-08-10 ENCOUNTER — Ambulatory Visit
Admission: RE | Admit: 2018-08-10 | Discharge: 2018-08-10 | Disposition: A | Discharge status: Home / Self Care | Payer: 59 | Source: Ambulatory Visit | Attending: Oncology | Admitting: Oncology

## 2018-08-10 ENCOUNTER — Ambulatory Visit
Admission: RE | Admit: 2018-08-10 | Discharge: 2018-08-10 | Disposition: A | Discharge status: Home / Self Care | Payer: 59 | Source: Ambulatory Visit | Attending: Interventional Radiology | Admitting: Interventional Radiology

## 2018-08-10 ENCOUNTER — Encounter: Payer: Self-pay | Admitting: Oncology

## 2018-08-10 ENCOUNTER — Inpatient Hospital Stay (HOSPITAL_BASED_OUTPATIENT_CLINIC_OR_DEPARTMENT_OTHER): Payer: 59 | Admitting: Oncology

## 2018-08-10 VITALS — BP 130/76 | HR 94 | Temp 96.6°F | Resp 18 | Ht 69.0 in | Wt 235.7 lb

## 2018-08-10 DIAGNOSIS — D75839 Thrombocytosis, unspecified: Secondary | ICD-10-CM

## 2018-08-10 DIAGNOSIS — C541 Malignant neoplasm of endometrium: Secondary | ICD-10-CM | POA: Insufficient documentation

## 2018-08-10 DIAGNOSIS — D500 Iron deficiency anemia secondary to blood loss (chronic): Secondary | ICD-10-CM

## 2018-08-10 DIAGNOSIS — Z9889 Other specified postprocedural states: Secondary | ICD-10-CM

## 2018-08-10 DIAGNOSIS — J90 Pleural effusion, not elsewhere classified: Secondary | ICD-10-CM | POA: Diagnosis not present

## 2018-08-10 DIAGNOSIS — N1330 Unspecified hydronephrosis: Secondary | ICD-10-CM

## 2018-08-10 DIAGNOSIS — Z7189 Other specified counseling: Secondary | ICD-10-CM

## 2018-08-10 DIAGNOSIS — F409 Phobic anxiety disorder, unspecified: Secondary | ICD-10-CM

## 2018-08-10 DIAGNOSIS — D473 Essential (hemorrhagic) thrombocythemia: Secondary | ICD-10-CM

## 2018-08-10 DIAGNOSIS — F5105 Insomnia due to other mental disorder: Secondary | ICD-10-CM

## 2018-08-10 DIAGNOSIS — R188 Other ascites: Secondary | ICD-10-CM | POA: Diagnosis not present

## 2018-08-10 MED ORDER — PROCHLORPERAZINE MALEATE 10 MG PO TABS: 10.0000 mg | ORAL_TABLET | Freq: Four times a day (QID) | ORAL | 1 refills | Status: DC | PRN

## 2018-08-10 MED ORDER — DEXAMETHASONE 4 MG PO TABS: 8.0000 mg | ORAL_TABLET | Freq: Every day | ORAL | 1 refills | Status: DC

## 2018-08-10 MED ORDER — ALPRAZOLAM 0.25 MG PO TABS: 0.2500 mg | ORAL_TABLET | Freq: Every evening | ORAL | 0 refills | Status: DC | PRN

## 2018-08-10 MED ORDER — ONDANSETRON HCL 8 MG PO TABS: 8.0000 mg | ORAL_TABLET | Freq: Two times a day (BID) | ORAL | 1 refills | Status: DC | PRN

## 2018-08-10 MED ORDER — LIDOCAINE-PRILOCAINE 2.5-2.5 % EX CREA: TOPICAL_CREAM | CUTANEOUS | 3 refills | Status: DC

## 2018-08-10 NOTE — Progress Notes (Signed)
START ON PATHWAY REGIMEN - Uterine     A cycle is every 21 days:     Paclitaxel      Carboplatin   **Always confirm dose/schedule in your pharmacy ordering system**  Patient Characteristics: Endometrioid Histology, Newly Diagnosed, Medically Inoperable, Clinical Stage IV Histology: Endometrioid Histology Therapeutic Status: Newly Diagnosed AJCC T Category: TX AJCC N Category: NX AJCC M Category: M1 AJCC 8 Stage Grouping: IVB Surgical Status: Medically Inoperable Intent of Therapy: Non-Curative / Palliative Intent, Discussed with Patient 

## 2018-08-10 NOTE — Progress Notes (Signed)
Patient here for initial visit. °

## 2018-08-10 NOTE — Procedures (Signed)
  Procedure: R thora [place] 1L EBL:   minimal Complications:  none immediate  See full dictation in [facility].  Dillard Cannon MD Main # [phone] Pager  [phone]

## 2018-08-10 NOTE — Progress Notes (Signed)
Hematology/Oncology Consult note [facility] [phone] [phone]   Patient Care Team: Virginia Crews, MD as PCP - General (Family Medicine) Clent Jacks, RN as Registered Nurse  REFERRING PROVIDER: Dr.Berchuck CHIEF COMPLAINTS/REASON FOR VISIT:  Evaluation of endometrial cancer.   HISTORY OF PRESENTING ILLNESS:  Karen Mann is a  57 y.o.  female with PMH listed below who was referred to me for evaluation of newly diagnosed endometrial cancer. Patient was recently referred to GYN Dr. Gilman Schmidt for abnormal uterine bleeding. 07/23/2018 ultrasound was performed showed large pelvic mass 20 x 13 x 22 cm with cystic and solid components.  Ultrasound also showed 4.9 cm fibroid and thickened endometrium 15 mm thickness 08/05/2018 MRI pelvis with and without contrast 20.6 cm complex cystic solid mass in the pelvis with numerous thickened enhancing septations, compatible with malignant ovarian neoplasm.  Neither ovary was discretely visualized.  Small volume of ascites.  Cross paratonia disease is not visualized but not excluded.  Left hydro-ureteronephrosis secondary to extrinsic compression. 5.3 intramural anterior uterine body fibroid.  #Endometrial biopsy was performed.  Pathology showed endometrioid adenocarcinoma consistent with FIGO grade 3.  8/12/19CA125 was elevated at 622 Patient was referred to see Dr. Fransisca Connors.  She was evaluated by Dr. Meredith Mody on 08/08/2018.  Staging CT was obtained. #Images independently reviewed by me.   Staging CT showed large cystic and a solid mass likely arising from the ovaries, measures 369 0 cc, suspicious for ovarian malignancy.  Moderate ascites noted.  Raising concern for malignant peritoneal spread.  No well-defined omental caking or solid tumor deposits along a situs., -Large right pleural effusion over half of right hemithorax volume. -Liver hypodensity 2.8 x 2.8 cm -Moderate left and mild right  hydronephrosis attributed to the ureteral compression due to mass. -Other chronic image findings.  MRI pelvis with and without contrast again showed 20.6 cm complex cystic/solid mass in the pelvis.  Compatible with malignant ovarian neoplasm.  Small volume of a situs.  Cross peritoneal disease is not visualized but cannot be excluded.  Left hydroureteronephrosis secondary to compression.  #Patient was scheduled to have a diagnostic and therapeutic thoracentesis this morning.  Cytology pending. She feels breathing is much better after the thoracentesis. Mild shortness of breath, worse with exertion.  Heart palpitation as well denies any chest pain, abdominal pain. Ongoing vaginal bleeding, she passed a big clot today. Feel extremely fatigued. She feels overwhelming and very nervous about the cancer diagnosis.  She has insomnia and she has tried melatonin which did not help with the sleep.   Review of Systems  Constitutional: Positive for malaise/fatigue. Negative for chills, fever and weight loss.  HENT: Negative for nosebleeds and sore throat.   Eyes: Negative for double vision, photophobia and redness.  Respiratory: Positive for shortness of breath. Negative for cough and wheezing.   Cardiovascular: Positive for palpitations. Negative for chest pain and orthopnea.  Gastrointestinal: Negative for abdominal pain, blood in stool, nausea and vomiting.       Abdominal distention  Genitourinary: Negative for dysuria.  Musculoskeletal: Positive for back pain. Negative for myalgias and neck pain.       Chronic back pain  Skin: Negative for itching and rash.  Neurological: Negative for dizziness, tingling and tremors.  Endo/Heme/Allergies: Negative for environmental allergies. Does not bruise/bleed easily.  Psychiatric/Behavioral: Negative for depression. The patient is nervous/anxious.     MEDICAL HISTORY:  Past Medical History:  Diagnosis Date  . Allergy   . Anemia 07/20/2018  .  Endometrial adenocarcinoma ([place]) 07/2018  . Fibroid uterus 07/24/2018   5cm/2 inch   . Ovarian mass 07/24/2018   22cm/10 inch  . Pelvic mass     SURGICAL HISTORY: Past Surgical History:  Procedure Laterality Date  . EYE SURGERY  2000   Lasik    SOCIAL HISTORY: Social History   Socioeconomic History  . Marital status: Married    Spouse name: Not on file  . Number of children: Not on file  . Years of education: Not on file  . Highest education level: Not on file  Occupational History  . Not on file  Social Needs  . Financial resource strain: Not on file  . Food insecurity:    Worry: Not on file    Inability: Not on file  . Transportation needs:    Medical: Not on file    Non-medical: Not on file  Tobacco Use  . Smoking status: Never Smoker  . Smokeless tobacco: Never Used  Substance and Sexual Activity  . Alcohol use: No  . Drug use: No  . Sexual activity: Not Currently    Birth control/protection: Post-menopausal  Lifestyle  . Physical activity:    Days per week: Not on file    Minutes per session: Not on file  . Stress: Not on file  Relationships  . Social connections:    Talks on phone: Not on file    Gets together: Not on file    Attends religious service: Not on file    Active member of club or organization: Not on file    Attends meetings of clubs or organizations: Not on file    Relationship status: Not on file  . Intimate partner violence:    Fear of current or ex partner: Not on file    Emotionally abused: Not on file    Physically abused: Not on file    Forced sexual activity: Not on file  Other Topics Concern  . Not on file  Social History Narrative  . Not on file    FAMILY HISTORY: Family History  Problem Relation Age of Onset  . Hypertension Father   . Healthy Brother     ALLERGIES:  is allergic to penicillins.  MEDICATIONS:  Current Outpatient Medications  Medication Sig Dispense Refill  . ferrous sulfate 325 (65 FE) MG tablet  Take 325 mg by mouth daily with breakfast.    . naproxen (NAPROSYN) 500 MG tablet Take 1 tablet (500 mg total) by mouth 2 (two) times daily with a meal. 60 tablet 2  . Polyethylene Glycol 3350 (MIRALAX PO) Take by mouth daily.     Marland Kitchen rOPINIRole (REQUIP) 0.25 MG tablet Take 1 tablet (0.25 mg total) by mouth at bedtime. Take 1-3 hours before bedtime 30 tablet 3  . ALPRAZolam (XANAX) 0.25 MG tablet Take 1 tablet (0.25 mg total) by mouth at bedtime as needed for anxiety or sleep. 20 tablet 0   No current facility-administered medications for this visit.      PHYSICAL EXAMINATION: ECOG PERFORMANCE STATUS: 1 - Symptomatic but completely ambulatory Vitals:   08/10/18 1411  BP: 130/76  Pulse: 94  Resp: 18  Temp: (!) 96.6 F (35.9 C)   Filed Weights   08/10/18 1411  Weight: 235 lb 11.2 oz (106.9 kg)    Physical Exam  Constitutional: She is oriented to person, place, and time. She appears well-developed and well-nourished. No distress.  HENT:  Head: Normocephalic and atraumatic.  Mouth/Throat: Oropharynx is clear and moist.  Eyes: Pupils are equal, round, and reactive to light. EOM are normal. No scleral icterus.  Pale conjunctivae  Neck: Normal range of motion. Neck supple.  Cardiovascular: Normal rate and normal heart sounds.  Pulmonary/Chest: Effort normal. No respiratory distress. She has no wheezes.  Decreased breath sounds bilateral.  Abdominal: Soft. Bowel sounds are normal. She exhibits no distension. There is no tenderness.  Palpable lower abdominal/pelvis mass  Musculoskeletal: Normal range of motion. She exhibits edema. She exhibits no deformity.  Bilateral 2+ pitting edema  Neurological: She is alert and oriented to person, place, and time. No cranial nerve deficit. Coordination normal.  Skin: Skin is warm and dry. No rash noted. No erythema.  Psychiatric: She has a normal mood and affect.     LABORATORY DATA:  I have reviewed the data as listed Lab Results    Component Value Date   WBC 11.3 (H) 08/08/2018   HGB 8.5 (L) 08/08/2018   HCT 27.7 (L) 08/08/2018   MCV 71.4 (L) 08/08/2018   PLT 706 (H) 08/08/2018   Recent Labs    07/17/18 1124 08/08/18 1240  NA 137 137  K 4.4 4.0  CL 99 102  CO2 20 20*  GLUCOSE 75 88  BUN 7 11  CREATININE 0.78 0.82  CALCIUM 8.8 8.6*  GFRNONAA 85 >60  GFRAA 98 >60  PROT 6.5 7.1  ALBUMIN 3.8 3.3*  AST 21 16  ALT 7 7  ALKPHOS 59 60  BILITOT 0.4 0.9   Iron/TIBC/Ferritin/ %Sat    Component Value Date/Time   IRON 11 (L) 07/17/2018 1124   TIBC 312 07/17/2018 1124   FERRITIN 60 07/17/2018 1124   IRONPCTSAT 4 (LL) 07/17/2018 1124        ASSESSMENT & PLAN:  1. Endometrial adenocarcinoma ([place])   2. Iron deficiency anemia due to chronic blood loss   3. Thrombocytosis ([place])   4. Pleural effusion   5. Other ascites   6. Hydronephrosis, unspecified hydronephrosis type   7. Insomnia due to anxiety and fear    #Imaging scans were independently reviewed with patient and her husband. Pathology report was also discussed. Patient has FIGO grade 3 endometrial adenocarcinoma, clinically with extrauterine disease including possible ovarian metastasis, Ascites, pleural effusion, and/or concomitant ovarian malignancies. I discussed with Dr. Fransisca Connors and agree with upfront systemic therapy with carboplatin and paclitaxel 3 cycles, reassessment for surgical evaluation.   I explained to the patient the risks and benefits of chemotherapy Carboplatin and Taxol, including all but not limited to hair loss, mouth sore, nausea, vomiting, low blood counts, bleeding, and risk of life threatening infection and even death, secondary malignancy etc.  Risk of neuropathy is associated with Taxol. . Patient voices understanding and willing to proceed chemotherapy.   # Chemotherapy education; port placement. Hopefully the planned start chemotherapy next week. Antiemetics-Zofran and Compazine; EMLA cream sent to pharmacy. Growth  factor-Udenyca would be given as prophylaxis for chemotherapy-induced neutropenia to prevent febrile neutropenias. Discussed potential side effect- myalgias/arthralgias- recommend Claritin for 4 days.   #Severe iron deficiency, I advised patient to continue using oral ferrous sulfate supplements today in the weekend I will arrange patient to get IV Venofer twice a week for 4 doses. Plan IV iron with Venofer 200mg  weekly x 4 doses. Allergy reactions/infusion reaction including anaphylactic reaction discussed with patient. Other side effects include but not limited to high blood pressure, skin rash, weight gain, leg swelling, etc. Patient voices understanding and willing to proceed.  # Thrombocytosis: due to severe iron  deficiency.  # Hydronephrosis, Cr normal. Continue close monitor.  # Anxiety, insomnia, melatonin not helpful.  Prescribed low-dose Xanax 0.25 mg nightly as needed to help with her anxiety and sleeping.  #Refer to genetic counselor for genetic testing.  Family history of ovarian cancer and personal history of endometrial cancer.  #She agreed to participate exact science clinical trial.  She had a discussion with Jenny Reichmann and was consented to the study.  # goal of care discussed. Palliative intent. Clinically stage IV disease, awaiting thoracentesis fluid cytology.   Orders Placed This Encounter  Procedures  . Ambulatory referral to Vascular Surgery    Referral Priority:   Routine    Referral Type:   Surgical    Referral Reason:   Specialty Services Required    Referred to Provider:   Algernon Huxley, MD    Requested Specialty:   Vascular Surgery    Number of Visits Requested:   1    All questions were answered. The patient knows to call the clinic with any problems questions or concerns.  Return of visit: day 1 of first cycle of chemotherapy.  Thank you for this kind referral and the opportunity to participate in the care of this patient. A copy of today's note is routed to  referring provider    Total face to face encounter time for this patient visit was 80 min. >50% of the time was  spent in counseling and coordination of care.  Earlie Server, MD, PhD Hematology Oncology [facility] at [facility] Pager- [phone] 08/10/2018

## 2018-08-10 NOTE — Progress Notes (Signed)
Dr. Fransisca Connors reviewed imaging and felt patient needs chemo upfront as opposed to surgery. He recommends urgent referral to medical oncology and is hoping she can be seen at [facility] after thoracentesis on 8/16. Will discuss with patient and referral coordinator to see if possible.

## 2018-08-10 NOTE — Progress Notes (Signed)
Met with Karen Mann and her spouse after consult with Dr. Yu. Introduced nurse navigator services and provided contact information for any future needs. She has received port placement instructions and  chemo class has been arranged. She will start carbo/taxol 8/21. We will see her back in Gyn Onc after 3 cycles and re-imaging to evaluate for debulking surgery. Oncology Nurse Navigator Documentation  Navigator Location: [facility]Med Onc (08/10/18 1500)   )Navigator Encounter Type: Initial MedOnc (08/10/18 1500)                 Multidisiplinary Clinic Type: GYN (08/10/18 1500)   Patient Visit Type: MedOnc;Initial (08/10/18 1500)                              Time Spent with Patient: 45 (08/10/18 1500)    

## 2018-08-13 ENCOUNTER — Encounter
Admission: RE | Disposition: A | Discharge status: Home / Self Care | Payer: Self-pay | Source: Ambulatory Visit | Attending: Vascular Surgery

## 2018-08-13 ENCOUNTER — Inpatient Hospital Stay: Payer: 59

## 2018-08-13 ENCOUNTER — Other Ambulatory Visit (INDEPENDENT_AMBULATORY_CARE_PROVIDER_SITE_OTHER): Payer: Self-pay | Admitting: Nurse Practitioner

## 2018-08-13 ENCOUNTER — Other Ambulatory Visit: Payer: Self-pay | Admitting: Oncology

## 2018-08-13 ENCOUNTER — Ambulatory Visit
Admission: RE | Admit: 2018-08-13 | Discharge: 2018-08-13 | Disposition: A | Discharge status: Home / Self Care | Payer: 59 | Source: Ambulatory Visit | Attending: Vascular Surgery | Admitting: Vascular Surgery

## 2018-08-13 DIAGNOSIS — R1900 Intra-abdominal and pelvic swelling, mass and lump, unspecified site: Secondary | ICD-10-CM

## 2018-08-13 DIAGNOSIS — C541 Malignant neoplasm of endometrium: Secondary | ICD-10-CM | POA: Diagnosis present

## 2018-08-13 DIAGNOSIS — F409 Phobic anxiety disorder, unspecified: Secondary | ICD-10-CM | POA: Diagnosis not present

## 2018-08-13 DIAGNOSIS — Z8249 Family history of ischemic heart disease and other diseases of the circulatory system: Secondary | ICD-10-CM | POA: Diagnosis not present

## 2018-08-13 DIAGNOSIS — D473 Essential (hemorrhagic) thrombocythemia: Secondary | ICD-10-CM | POA: Diagnosis not present

## 2018-08-13 DIAGNOSIS — D500 Iron deficiency anemia secondary to blood loss (chronic): Secondary | ICD-10-CM | POA: Insufficient documentation

## 2018-08-13 DIAGNOSIS — F5105 Insomnia due to other mental disorder: Secondary | ICD-10-CM | POA: Insufficient documentation

## 2018-08-13 DIAGNOSIS — Z9889 Other specified postprocedural states: Secondary | ICD-10-CM | POA: Diagnosis not present

## 2018-08-13 DIAGNOSIS — R188 Other ascites: Secondary | ICD-10-CM | POA: Diagnosis not present

## 2018-08-13 DIAGNOSIS — J90 Pleural effusion, not elsewhere classified: Secondary | ICD-10-CM | POA: Insufficient documentation

## 2018-08-13 DIAGNOSIS — Z880 Allergy status to penicillin: Secondary | ICD-10-CM | POA: Insufficient documentation

## 2018-08-13 DIAGNOSIS — N1330 Unspecified hydronephrosis: Secondary | ICD-10-CM | POA: Diagnosis not present

## 2018-08-13 HISTORY — PX: PORTA CATH INSERTION: CATH118285

## 2018-08-13 SURGERY — PORTA CATH INSERTION
Anesthesia: Moderate Sedation

## 2018-08-13 MED ORDER — MIDAZOLAM HCL 2 MG/2ML IJ SOLN
INTRAMUSCULAR | Status: DC | PRN
  Administered 2018-08-13: 1 mg via INTRAVENOUS
  Administered 2018-08-13: 2 mg via INTRAVENOUS

## 2018-08-13 MED ORDER — CLINDAMYCIN PHOSPHATE 300 MG/50ML IV SOLN
INTRAVENOUS | Status: AC
  Filled 2018-08-13: qty 50

## 2018-08-13 MED ORDER — HYDROMORPHONE HCL 1 MG/ML IJ SOLN: 1.0000 mg | Freq: Once | INTRAMUSCULAR | Status: DC

## 2018-08-13 MED ORDER — LIDOCAINE HCL (PF) 1 % IJ SOLN
INTRAMUSCULAR | Status: DC | PRN
  Administered 2018-08-13: 20 mL

## 2018-08-13 MED ORDER — SODIUM CHLORIDE 0.9 % IV SOLN
Freq: Once | INTRAVENOUS | Status: DC
  Filled 2018-08-13: qty 2

## 2018-08-13 MED ORDER — CLINDAMYCIN PHOSPHATE 300 MG/50ML IV SOLN
300.0000 mg | Freq: Once | INTRAVENOUS | Status: AC
  Administered 2018-08-13: 300 mg via INTRAVENOUS

## 2018-08-13 MED ORDER — LIDOCAINE-EPINEPHRINE (PF) 1 %-1:200000 IJ SOLN
INTRAMUSCULAR | Status: AC
  Filled 2018-08-13: qty 30

## 2018-08-13 MED ORDER — MIDAZOLAM HCL 5 MG/5ML IJ SOLN
INTRAMUSCULAR | Status: AC
  Filled 2018-08-13: qty 5

## 2018-08-13 MED ORDER — ONDANSETRON HCL 4 MG/2ML IJ SOLN: 4.0000 mg | Freq: Four times a day (QID) | INTRAMUSCULAR | Status: DC | PRN

## 2018-08-13 MED ORDER — HEPARIN (PORCINE) IN NACL 1000-0.9 UT/500ML-% IV SOLN
INTRAVENOUS | Status: AC
  Filled 2018-08-13: qty 500

## 2018-08-13 MED ORDER — SODIUM CHLORIDE 0.9 % IV SOLN
INTRAVENOUS | Status: DC
  Administered 2018-08-13: 1000 mL via INTRAVENOUS

## 2018-08-13 MED ORDER — FENTANYL CITRATE (PF) 100 MCG/2ML IJ SOLN
INTRAMUSCULAR | Status: AC
  Filled 2018-08-13: qty 2

## 2018-08-13 MED ORDER — FENTANYL CITRATE (PF) 100 MCG/2ML IJ SOLN
INTRAMUSCULAR | Status: DC | PRN
  Administered 2018-08-13 (×2): 50 ug via INTRAVENOUS

## 2018-08-13 SURGICAL SUPPLY — 12 items
COVER PROBE U/S 5X48 (MISCELLANEOUS) ×2 IMPLANT
DERMABOND ADVANCED (GAUZE/BANDAGES/DRESSINGS) ×1
DERMABOND ADVANCED .7 DNX12 (GAUZE/BANDAGES/DRESSINGS) ×1 IMPLANT
KIT PORT POWER 8FR ISP CVUE (Port) ×2 IMPLANT
PACK ANGIOGRAPHY (CUSTOM PROCEDURE TRAY) ×2 IMPLANT
PAD GROUND ADULT SPLIT (MISCELLANEOUS) ×2 IMPLANT
PENCIL ELECTRO HAND CTR (MISCELLANEOUS) ×2 IMPLANT
SUT MNCRL AB 4-0 PS2 18 (SUTURE) ×2 IMPLANT
SUT PROLENE 0 CT 1 30 (SUTURE) ×2 IMPLANT
SUT VIC AB 3-0 SH 27 (SUTURE) ×1
SUT VIC AB 3-0 SH 27X BRD (SUTURE) ×1 IMPLANT
TOWEL OR 17X26 4PK STRL BLUE (TOWEL DISPOSABLE) ×2 IMPLANT

## 2018-08-13 NOTE — H&P (Signed)
 [facility] History & Physical Update  The patient was interviewed and re-examined.  The patient's previous History and Physical has been reviewed and is unchanged.  There is no change in the plan of care. We plan to proceed with the scheduled procedure.  Leotis Pain, MD  08/13/2018, 1:08 PM

## 2018-08-13 NOTE — Op Note (Signed)
      [place] [facility]       Operative Note  Date: 08/13/2018  Preoperative diagnosis:  1. Endometrial cancer  Postoperative diagnosis:  Same as above  Procedures: #1. Ultrasound guidance for vascular access to the right internal jugular vein. #2. Fluoroscopic guidance for placement of catheter. #3. Placement of CT compatible Port-A-Cath, right internal jugular vein.  Surgeon: Leotis Pain, MD.   Anesthesia: Local with moderate conscious sedation for approximately 25  minutes using 3 mg of Versed and 100 mcg of Fentanyl  Fluoroscopy time: less than 1 minute  Contrast used: 0  Estimated blood loss: 5 cc  Indication for the procedure:  The patient is a 57 y.o.female with endometrial cancer.  The patient needs a Port-A-Cath for durable venous access, chemotherapy, lab draws, and CT scans. We are asked to place this. Risks and benefits were discussed and informed consent was obtained.  Description of procedure: The patient was brought to the vascular and interventional radiology suite.  Moderate conscious sedation was administered throughout the procedure during a face to face encounter with the patient with my supervision of the RN administering medicines and monitoring the patient's vital signs, pulse oximetry, telemetry and mental status throughout from the start of the procedure until the patient was taken to the recovery room. The right neck chest and shoulder were sterilely prepped and draped, and a sterile surgical field was created. Ultrasound was used to help visualize a patent right internal jugular vein. This was then accessed under direct ultrasound guidance without difficulty with the Seldinger needle and a permanent image was recorded. A J-wire was placed. After skin nick and dilatation, the peel-away sheath was then placed over the wire. I then anesthetized an area under the clavicle approximately 1-2 fingerbreadths. A transverse incision was created and an inferior  pocket was created with electrocautery and blunt dissection. The port was then brought onto the field, placed into the pocket and secured to the chest wall with 2 Prolene sutures. The catheter was connected to the port and tunneled from the subclavicular incision to the access site. Fluoroscopic guidance was then used to cut the catheter to an appropriate length. The catheter was then placed through the peel-away sheath and the peel-away sheath was removed. The catheter tip was parked in excellent location under fluorocoscopic guidance in the cavoatrial junction. The pocket was then irrigated with antibiotic impregnated saline and the wound was closed with a running 3-0 Vicryl and a 4-0 Monocryl. The access incision was closed with a single 4-0 Monocryl. The Huber needle was used to withdraw blood and flush the port with heparinized saline. Dermabond was then placed as a dressing. The patient tolerated the procedure well and was taken to the recovery room in stable condition.   Leotis Pain 08/13/2018 2:27 PM   This note was created with Dragon Medical transcription system. Any errors in dictation are purely unintentional.

## 2018-08-13 NOTE — Patient Instructions (Signed)

## 2018-08-14 LAB — CYTOLOGY - NON PAP

## 2018-08-15 ENCOUNTER — Inpatient Hospital Stay: Payer: 59

## 2018-08-15 ENCOUNTER — Other Ambulatory Visit: Payer: 59

## 2018-08-15 ENCOUNTER — Encounter: Payer: Self-pay | Admitting: Oncology

## 2018-08-15 ENCOUNTER — Other Ambulatory Visit: Payer: Self-pay

## 2018-08-15 ENCOUNTER — Inpatient Hospital Stay (HOSPITAL_BASED_OUTPATIENT_CLINIC_OR_DEPARTMENT_OTHER): Payer: 59 | Admitting: Oncology

## 2018-08-15 ENCOUNTER — Telehealth: Payer: Self-pay | Admitting: Oncology

## 2018-08-15 ENCOUNTER — Ambulatory Visit: Payer: 59

## 2018-08-15 VITALS — BP 117/81 | HR 99 | Temp 97.8°F | Resp 20

## 2018-08-15 VITALS — BP 130/83 | HR 110 | Temp 96.6°F | Resp 20 | Wt 236.5 lb

## 2018-08-15 DIAGNOSIS — F419 Anxiety disorder, unspecified: Secondary | ICD-10-CM

## 2018-08-15 DIAGNOSIS — G4700 Insomnia, unspecified: Secondary | ICD-10-CM

## 2018-08-15 DIAGNOSIS — N1330 Unspecified hydronephrosis: Secondary | ICD-10-CM

## 2018-08-15 DIAGNOSIS — R188 Other ascites: Secondary | ICD-10-CM

## 2018-08-15 DIAGNOSIS — C541 Malignant neoplasm of endometrium: Secondary | ICD-10-CM

## 2018-08-15 DIAGNOSIS — Z79899 Other long term (current) drug therapy: Secondary | ICD-10-CM

## 2018-08-15 DIAGNOSIS — D259 Leiomyoma of uterus, unspecified: Secondary | ICD-10-CM | POA: Diagnosis not present

## 2018-08-15 DIAGNOSIS — D500 Iron deficiency anemia secondary to blood loss (chronic): Secondary | ICD-10-CM

## 2018-08-15 DIAGNOSIS — R5383 Other fatigue: Secondary | ICD-10-CM

## 2018-08-15 DIAGNOSIS — D473 Essential (hemorrhagic) thrombocythemia: Secondary | ICD-10-CM

## 2018-08-15 DIAGNOSIS — J90 Pleural effusion, not elsewhere classified: Secondary | ICD-10-CM | POA: Diagnosis not present

## 2018-08-15 DIAGNOSIS — Z5111 Encounter for antineoplastic chemotherapy: Secondary | ICD-10-CM

## 2018-08-15 DIAGNOSIS — M7989 Other specified soft tissue disorders: Secondary | ICD-10-CM

## 2018-08-15 DIAGNOSIS — Z8041 Family history of malignant neoplasm of ovary: Secondary | ICD-10-CM

## 2018-08-15 DIAGNOSIS — D75839 Thrombocytosis, unspecified: Secondary | ICD-10-CM

## 2018-08-15 LAB — COMPREHENSIVE METABOLIC PANEL
ALT: 8 U/L (ref 0–44)
ANION GAP: 8 (ref 5–15)
AST: 17 U/L (ref 15–41)
Albumin: 3.1 g/dL — ABNORMAL LOW (ref 3.5–5.0)
Alkaline Phosphatase: 63 U/L (ref 38–126)
BILIRUBIN TOTAL: 0.6 mg/dL (ref 0.3–1.2)
BUN: 12 mg/dL (ref 6–20)
CHLORIDE: 105 mmol/L (ref 98–111)
CO2: 21 mmol/L — ABNORMAL LOW (ref 22–32)
Calcium: 8.5 mg/dL — ABNORMAL LOW (ref 8.9–10.3)
Creatinine, Ser: 0.72 mg/dL (ref 0.44–1.00)
Glucose, Bld: 95 mg/dL (ref 70–99)
POTASSIUM: 3.9 mmol/L (ref 3.5–5.1)
Sodium: 134 mmol/L — ABNORMAL LOW (ref 135–145)
TOTAL PROTEIN: 6.9 g/dL (ref 6.5–8.1)

## 2018-08-15 LAB — CBC WITH DIFFERENTIAL/PLATELET
BASOS ABS: 0.1 10*3/uL (ref 0–0.1)
Basophils Relative: 1 %
Eosinophils Absolute: 1.4 10*3/uL — ABNORMAL HIGH (ref 0–0.7)
Eosinophils Relative: 12 %
HCT: 26.7 % — ABNORMAL LOW (ref 35.0–47.0)
HEMOGLOBIN: 8.3 g/dL — AB (ref 12.0–16.0)
LYMPHS ABS: 1 10*3/uL (ref 1.0–3.6)
LYMPHS PCT: 9 %
MCH: 22.4 pg — AB (ref 26.0–34.0)
MCHC: 31 g/dL — ABNORMAL LOW (ref 32.0–36.0)
MCV: 72.3 fL — AB (ref 80.0–100.0)
Monocytes Absolute: 0.7 10*3/uL (ref 0.2–0.9)
Monocytes Relative: 6 %
NEUTROS PCT: 72 %
Neutro Abs: 7.9 10*3/uL — ABNORMAL HIGH (ref 1.4–6.5)
PLATELETS: 625 10*3/uL — AB (ref 150–440)
RBC: 3.7 MIL/uL — AB (ref 3.80–5.20)
RDW: 19.4 % — ABNORMAL HIGH (ref 11.5–14.5)
WBC: 11.1 10*3/uL — AB (ref 3.6–11.0)

## 2018-08-15 MED ORDER — SODIUM CHLORIDE 0.9 % IV SOLN: 200.0000 mg | Freq: Once | INTRAVENOUS | Status: DC

## 2018-08-15 MED ORDER — SODIUM CHLORIDE 0.9 % IV SOLN
Freq: Once | INTRAVENOUS | Status: DC
  Filled 2018-08-15: qty 250

## 2018-08-15 MED ORDER — SODIUM CHLORIDE 0.9 % IV SOLN
900.0000 mg | Freq: Once | INTRAVENOUS | Status: AC
  Administered 2018-08-15: 900 mg via INTRAVENOUS
  Filled 2018-08-15: qty 90

## 2018-08-15 MED ORDER — SODIUM CHLORIDE 0.9% FLUSH
10.0000 mL | Freq: Once | INTRAVENOUS | Status: DC
  Filled 2018-08-15: qty 10

## 2018-08-15 MED ORDER — FAMOTIDINE IN NACL 20-0.9 MG/50ML-% IV SOLN
20.0000 mg | Freq: Once | INTRAVENOUS | Status: AC
  Administered 2018-08-15: 20 mg via INTRAVENOUS
  Filled 2018-08-15: qty 50

## 2018-08-15 MED ORDER — SODIUM CHLORIDE 0.9 % IV SOLN
175.0000 mg/m2 | Freq: Once | INTRAVENOUS | Status: AC
  Administered 2018-08-15: 402 mg via INTRAVENOUS
  Filled 2018-08-15: qty 67

## 2018-08-15 MED ORDER — DIPHENHYDRAMINE HCL 50 MG/ML IJ SOLN
50.0000 mg | Freq: Once | INTRAMUSCULAR | Status: AC
  Administered 2018-08-15: 50 mg via INTRAVENOUS
  Filled 2018-08-15: qty 1

## 2018-08-15 MED ORDER — SODIUM CHLORIDE 0.9 % IV SOLN
Freq: Once | INTRAVENOUS | Status: AC
  Administered 2018-08-15: 11:00:00 via INTRAVENOUS
  Filled 2018-08-15: qty 250

## 2018-08-15 MED ORDER — HEPARIN SOD (PORK) LOCK FLUSH 100 UNIT/ML IV SOLN
500.0000 [IU] | Freq: Once | INTRAVENOUS | Status: AC
  Administered 2018-08-15: 500 [IU] via INTRAVENOUS
  Filled 2018-08-15: qty 5

## 2018-08-15 MED ORDER — PALONOSETRON HCL INJECTION 0.25 MG/5ML
0.2500 mg | Freq: Once | INTRAVENOUS | Status: AC
  Administered 2018-08-15: 0.25 mg via INTRAVENOUS
  Filled 2018-08-15: qty 5

## 2018-08-15 MED ORDER — IRON SUCROSE 20 MG/ML IV SOLN
200.0000 mg | Freq: Once | INTRAVENOUS | Status: AC
  Administered 2018-08-15: 200 mg via INTRAVENOUS
  Filled 2018-08-15: qty 10

## 2018-08-15 MED ORDER — SODIUM CHLORIDE 0.9 % IV SOLN
Freq: Once | INTRAVENOUS | Status: AC
  Administered 2018-08-15: 11:00:00 via INTRAVENOUS
  Filled 2018-08-15: qty 5

## 2018-08-15 NOTE — Telephone Encounter (Signed)
Per Julie/Verbal, hold off on 08/15/18 los until she speaks with Dr. Tasia Catchings. Per Julie/ 08/21 Skype Msg, Schd patient for Labs and Venofer in [place] on Friday. Patient schd and given copy of updated appt schdf for [place] as requested.  Message sent to Almyra Free to update 08/15/18 los to reflect the order change.

## 2018-08-15 NOTE — Progress Notes (Signed)
Patient here today for follow up, chemo treatment and iron.  Patient c/o SOB and has started passing very large blood clots from her vagina since last office visit.

## 2018-08-15 NOTE — Progress Notes (Signed)
Hematology/Oncology follow up  note [facility] [phone] [phone]   Patient Care Team: Virginia Crews, MD as PCP - General (Family Medicine) Clent Jacks, RN as Registered Nurse  REFERRING PROVIDER: Dr.Berchuck CHIEF COMPLAINTS/REASON FOR VISIT:  Evaluation of endometrial cancer.   HISTORY OF PRESENTING ILLNESS:  Karen Mann is a  57 y.o.  female with PMH listed below who was referred to me for evaluation of newly diagnosed endometrial cancer. Patient was recently referred to GYN Dr. Gilman Schmidt for abnormal uterine bleeding. 07/23/2018 ultrasound was performed showed large pelvic mass 20 x 13 x 22 cm with cystic and solid components.  Ultrasound also showed 4.9 cm fibroid and thickened endometrium 15 mm thickness 08/05/2018 MRI pelvis with and without contrast 20.6 cm complex cystic solid mass in the pelvis with numerous thickened enhancing septations, compatible with malignant ovarian neoplasm.  Neither ovary was discretely visualized.  Small volume of ascites.  Cross paratonia disease is not visualized but not excluded.  Left hydro-ureteronephrosis secondary to extrinsic compression. 5.3 intramural anterior uterine body fibroid.  #Endometrial biopsy was performed.  Pathology showed endometrioid adenocarcinoma consistent with FIGO grade 3.  8/12/19CA125 was elevated at 622 Patient was referred to see Dr. Fransisca Connors.  She was evaluated by Dr. Meredith Mody on 08/08/2018.  Staging CT was obtained. #Images independently reviewed by me.   Staging CT showed large cystic and a solid mass likely arising from the ovaries, measures 369 0 cc, suspicious for ovarian malignancy.  Moderate ascites noted.  Raising concern for malignant peritoneal spread.  No well-defined omental caking or solid tumor deposits along a situs., -Large right pleural effusion over half of right hemithorax volume. -Liver hypodensity 2.8 x 2.8 cm -Moderate left and mild right  hydronephrosis attributed to the ureteral compression due to mass. -Other chronic image findings.  MRI pelvis with and without contrast again showed 20.6 cm complex cystic/solid mass in the pelvis.  Compatible with malignant ovarian neoplasm.  Small volume of a situs.  Cross peritoneal disease is not visualized but cannot be excluded.  Left hydroureteronephrosis secondary to compression.  #Patient was scheduled to have a diagnostic and therapeutic thoracentesis this morning.  Cytology pending. She feels breathing is much better after the thoracentesis. Mild shortness of breath, worse with exertion.  Heart palpitation as well denies any chest pain, abdominal pain. Ongoing vaginal bleeding, she passed a big clot today. Feel extremely fatigued. She feels overwhelming and very nervous about the cancer diagnosis.  She has insomnia and she has tried melatonin which did not help with the sleep.  # Dr. Fransisca Connors recommends systemic therapy with carboplatin and paclitaxel 3 cycles, reassessment for surgical evaluation.per Dr.Berchuck, not candidate for Pembrolizumab and Levetinib trial.    INTERVAL HISTORY Karen Mann is a 57 y.o. female who has above history reviewed by me today presents for follow-up visit for management of endometrial cancer, ovarian metastasis, Ascites, pleural effusion, and/or concomitant ovarian malignancies. She is status post thoracentesis 08/10/2018 and had 1.2 L fluid removed.  Cytology was negative for malignancy.  # fatigue: Patient continues to feel extremely fatigued and tired. #Vaginal bleeding, ongoing, passes big clots. #Shortness of breath, reports breathing relieved for about 1 to 2 days after thoracentesis get worse again.  Worsened with exertion.  Denies any chest pain.  Review of Systems  Constitutional: Positive for malaise/fatigue. Negative for chills, fever and weight loss.  HENT: Negative for nosebleeds and sore throat.   Eyes: Negative for double vision,  photophobia and  redness.  Respiratory: Positive for shortness of breath. Negative for cough and wheezing.   Cardiovascular: Positive for palpitations. Negative for chest pain and orthopnea.  Gastrointestinal: Negative for abdominal pain, blood in stool, nausea and vomiting.       Abdominal distention  Genitourinary: Negative for dysuria.  Musculoskeletal: Positive for back pain. Negative for myalgias and neck pain.       Chronic back pain  Skin: Negative for itching and rash.  Neurological: Negative for dizziness, tingling and tremors.  Endo/Heme/Allergies: Negative for environmental allergies. Does not bruise/bleed easily.  Psychiatric/Behavioral: Negative for depression. The patient is nervous/anxious.     MEDICAL HISTORY:  Past Medical History:  Diagnosis Date  . Allergy   . Anemia 07/20/2018  . Endometrial adenocarcinoma ([place]) 07/2018  . Fibroid uterus 07/24/2018   5cm/2 inch   . Ovarian mass 07/24/2018   22cm/10 inch  . Pelvic mass     SURGICAL HISTORY: Past Surgical History:  Procedure Laterality Date  . EYE SURGERY  2000   Lasik  . PORTA CATH INSERTION N/A 08/13/2018   Procedure: PORTA CATH INSERTION;  Surgeon: Algernon Huxley, MD;  Location: [place];  Service: Cardiovascular;  Laterality: N/A;    SOCIAL HISTORY: Social History   Socioeconomic History  . Marital status: Married    Spouse name: Not on file  . Number of children: Not on file  . Years of education: Not on file  . Highest education level: Not on file  Occupational History  . Not on file  Social Needs  . Financial resource strain: Not on file  . Food insecurity:    Worry: Not on file    Inability: Not on file  . Transportation needs:    Medical: Not on file    Non-medical: Not on file  Tobacco Use  . Smoking status: Never Smoker  . Smokeless tobacco: Never Used  Substance and Sexual Activity  . Alcohol use: No  . Drug use: No  . Sexual activity: Not Currently    Birth  control/protection: Post-menopausal  Lifestyle  . Physical activity:    Days per week: Not on file    Minutes per session: Not on file  . Stress: Not on file  Relationships  . Social connections:    Talks on phone: Not on file    Gets together: Not on file    Attends religious service: Not on file    Active member of club or organization: Not on file    Attends meetings of clubs or organizations: Not on file    Relationship status: Not on file  . Intimate partner violence:    Fear of current or ex partner: Not on file    Emotionally abused: Not on file    Physically abused: Not on file    Forced sexual activity: Not on file  Other Topics Concern  . Not on file  Social History Narrative  . Not on file    FAMILY HISTORY: Family History  Problem Relation Age of Onset  . Hypertension Father   . Ovarian cancer Mother   . Healthy Brother   . Ovarian cancer Maternal Aunt     ALLERGIES:  is allergic to penicillins.  MEDICATIONS:  Current Outpatient Medications  Medication Sig Dispense Refill  . ALPRAZolam (XANAX) 0.25 MG tablet Take 1 tablet (0.25 mg total) by mouth at bedtime as needed for anxiety or sleep. 20 tablet 0  . dexamethasone (DECADRON) 4 MG tablet Take 2 tablets (  8 mg total) by mouth daily. Start the day after chemotherapy for 2 days. 30 tablet 1  . ferrous sulfate 325 (65 FE) MG tablet Take 325 mg by mouth daily with breakfast.    . lidocaine-prilocaine (EMLA) cream Apply to affected area once 30 g 3  . naproxen (NAPROSYN) 500 MG tablet Take 1 tablet (500 mg total) by mouth 2 (two) times daily with a meal. 60 tablet 2  . ondansetron (ZOFRAN) 8 MG tablet Take 1 tablet (8 mg total) by mouth 2 (two) times daily as needed for refractory nausea / vomiting. Start on day 3 after chemo. 30 tablet 1  . Polyethylene Glycol 3350 (MIRALAX PO) Take by mouth daily.     . prochlorperazine (COMPAZINE) 10 MG tablet Take 1 tablet (10 mg total) by mouth every 6 (six) hours as needed  (Nausea or vomiting). 30 tablet 1  . rOPINIRole (REQUIP) 0.25 MG tablet Take 1 tablet (0.25 mg total) by mouth at bedtime. Take 1-3 hours before bedtime 30 tablet 3   No current facility-administered medications for this visit.    Facility-Administered Medications Ordered in Other Visits  Medication Dose Route Frequency Provider Last Rate Last Dose  . heparin lock flush 100 unit/mL  500 Units Intravenous Once Earlie Server, MD      . sodium chloride flush (NS) 0.9 % injection 10 mL  10 mL Intravenous Once Earlie Server, MD         PHYSICAL EXAMINATION: ECOG PERFORMANCE STATUS: 1 - Symptomatic but completely ambulatory Vitals:   08/15/18 0946  BP: 130/83  Pulse: (!) 110  Resp: 20  Temp: (!) 96.6 F (35.9 C)   Filed Weights   08/15/18 0946  Weight: 236 lb 8 oz (107.3 kg)    Physical Exam  Constitutional: She is oriented to person, place, and time. She appears well-developed and well-nourished. No distress.  HENT:  Head: Normocephalic and atraumatic.  Right Ear: External ear normal.  Left Ear: External ear normal.  Mouth/Throat: Oropharynx is clear and moist.  Eyes: Pupils are equal, round, and reactive to light. EOM are normal. No scleral icterus.  Pale conjunctivae  Neck: Normal range of motion. Neck supple.  Cardiovascular: Normal rate, regular rhythm and normal heart sounds.  Pulmonary/Chest: Effort normal. No respiratory distress. She has no wheezes.  Decreased breath sounds on right.  Abdominal: Soft. Bowel sounds are normal. She exhibits no distension and no mass. There is no tenderness.  Palpable lower abdominal/pelvis mass  Musculoskeletal: Normal range of motion. She exhibits edema. She exhibits no deformity.  Bilateral 2+ pitting edema  Neurological: She is alert and oriented to person, place, and time. No cranial nerve deficit. Coordination normal.  Skin: Skin is warm and dry. No rash noted. No erythema.  Psychiatric: She has a normal mood and affect. Her behavior is  normal. Thought content normal.     LABORATORY DATA:  I have reviewed the data as listed Lab Results  Component Value Date   WBC 11.1 (H) 08/15/2018   HGB 8.3 (L) 08/15/2018   HCT 26.7 (L) 08/15/2018   MCV 72.3 (L) 08/15/2018   PLT 625 (H) 08/15/2018   Recent Labs    07/17/18 1124 08/08/18 1240  NA 137 137  K 4.4 4.0  CL 99 102  CO2 20 20*  GLUCOSE 75 88  BUN 7 11  CREATININE 0.78 0.82  CALCIUM 8.8 8.6*  GFRNONAA 85 >60  GFRAA 98 >60  PROT 6.5 7.1  ALBUMIN 3.8 3.3*  AST  21 16  ALT 7 7  ALKPHOS 59 60  BILITOT 0.4 0.9   Iron/TIBC/Ferritin/ %Sat    Component Value Date/Time   IRON 11 (L) 07/17/2018 1124   TIBC 312 07/17/2018 1124   FERRITIN 60 07/17/2018 1124   IRONPCTSAT 4 (LL) 07/17/2018 1124        ASSESSMENT & PLAN:  1. Endometrial adenocarcinoma ([place])   2. Iron deficiency anemia due to chronic blood loss   3. Pleural effusion   4. Thrombocytosis ([facility])   5. Leg swelling   6. Encounter for antineoplastic chemotherapy    #  FIGO grade 3 endometrial adenocarcinoma, clinically with extrauterine disease including possible ovarian metastasis, Ascites, pleural effusion, and/or concomitant ovarian malignancies.  #Fatigue, secondary to profound iron deficiency anemia due to ongoing vaginal bleeding Patient is scheduled to have IV Venofer today. We will schedule patient to repeat another dose of Venofer 200 mg this week.    #Patient has been to chemo class education and during interval had Mediport placed. Labs are reviewed and discussed with patient.  Acceptable to proceed with first cycle of adjuvant carboplatin AUC of 6 with Taxol 75 mg/m. Growth factor-Neulasta would be given as prophylaxis for chemotherapy-induced neutropenia to prevent febrile neutropenias. Discussed potential side effect- myalgias/arthralgias- recommend Claritin for 4 days.   #Shortness of breath, secondary to right side pleural effusion. Physical examination indicate reaccumulation of  pleural effusion.  We will schedule patient to have ultrasound-guided thoracentesis tomorrow  # Thrombocytosis: Due to severe iron deficiency. # Hydronephrosis, creatinine normal.  Continue close monitor. # Anxiety, insomnia, melatonin not helpful.  Prescribed low-dose Xanax 0.25 mg nightly as needed to help with her anxiety and sleeping.  #Refer to genetic counselor for genetic testing.  Family history of ovarian cancer and personal history of endometrial cancer. Genetic testing was sent and pending result. #She agreed to participate exact science clinical trial.  Central lab sent.  # goal of care discussed. Palliative intent. Clinically stage IV disease, however cytology negative for malignancy  # Pleural effusion, We discussed with patient that although pleural effusion cytology was negative, cannot completely exclude cancer involvement. Other etiology needs to be rule out.  Will check thoracentesis fluid LDH, protein. obtain 2D echo to evaluate her baseline cardiac function.   Orders Placed This Encounter  Procedures  . US THORACENTESIS ASP PLEURAL SPACE W/IMG GUIDE    Standing Status:   Future    Standing Expiration Date:   10/16/2019    Order Specific Question:   Are labs required for specimen collection?    Answer:   No    Order Specific Question:   Reason for Exam (SYMPTOM  OR DIAGNOSIS REQUIRED)    Answer:   SOB    Order Specific Question:   Preferred imaging location?    Answer:   [facility]  . ECHOCARDIOGRAM COMPLETE    Standing Status:   Future    Standing Expiration Date:   11/16/2019    Order Specific Question:   Where should this test be performed    Answer:   [facility]    Order Specific Question:   Perflutren DEFINITY (image enhancing agent) should be administered unless hypersensitivity or allergy exist    Answer:   Administer Perflutren    Order Specific Question:   Expected Date:    Answer:   1 week    All questions were answered. The patient  knows to call the clinic with any problems questions or concerns.  Return of visit:  1 WEEK for evaluation of toxicity. Total face to face encounter time for this patient visit was 40 min. >50% of the time was  spent in counseling and coordination of care.   Earlie Server, MD, PhD Hematology Oncology [facility] Pager- [phone] 08/15/2018

## 2018-08-16 ENCOUNTER — Ambulatory Visit: Admission: RE | Admit: 2018-08-16 | Payer: 59 | Source: Ambulatory Visit

## 2018-08-16 ENCOUNTER — Ambulatory Visit
Admission: RE | Admit: 2018-08-16 | Discharge: 2018-08-16 | Disposition: A | Discharge status: Home / Self Care | Payer: 59 | Source: Ambulatory Visit | Attending: Diagnostic Radiology | Admitting: Diagnostic Radiology

## 2018-08-16 ENCOUNTER — Inpatient Hospital Stay: Payer: 59

## 2018-08-16 ENCOUNTER — Ambulatory Visit
Admission: RE | Admit: 2018-08-16 | Discharge: 2018-08-16 | Disposition: A | Discharge status: Home / Self Care | Payer: 59 | Source: Ambulatory Visit | Attending: Oncology | Admitting: Oncology

## 2018-08-16 ENCOUNTER — Other Ambulatory Visit: Payer: Self-pay | Admitting: Oncology

## 2018-08-16 DIAGNOSIS — D473 Essential (hemorrhagic) thrombocythemia: Secondary | ICD-10-CM | POA: Diagnosis not present

## 2018-08-16 DIAGNOSIS — D500 Iron deficiency anemia secondary to blood loss (chronic): Secondary | ICD-10-CM | POA: Diagnosis not present

## 2018-08-16 DIAGNOSIS — J90 Pleural effusion, not elsewhere classified: Secondary | ICD-10-CM | POA: Diagnosis not present

## 2018-08-16 DIAGNOSIS — J181 Lobar pneumonia, unspecified organism: Secondary | ICD-10-CM | POA: Diagnosis not present

## 2018-08-16 DIAGNOSIS — C541 Malignant neoplasm of endometrium: Secondary | ICD-10-CM | POA: Insufficient documentation

## 2018-08-16 DIAGNOSIS — D75839 Thrombocytosis, unspecified: Secondary | ICD-10-CM

## 2018-08-16 DIAGNOSIS — Z9889 Other specified postprocedural states: Secondary | ICD-10-CM | POA: Insufficient documentation

## 2018-08-16 LAB — BODY FLUID CELL COUNT WITH DIFFERENTIAL
Lymphs, Fluid: 21 %
Monocyte-Macrophage-Serous Fluid: 45 %
Neutrophil Count, Fluid: 34 %
WBC FLUID: 1391 uL

## 2018-08-16 LAB — ALBUMIN, PLEURAL OR PERITONEAL FLUID: ALBUMIN FL: 2.5 g/dL

## 2018-08-16 LAB — PROTEIN, PLEURAL OR PERITONEAL FLUID: Total protein, fluid: 4.2 g/dL

## 2018-08-16 LAB — LACTATE DEHYDROGENASE, PLEURAL OR PERITONEAL FLUID: LD, Fluid: 246 U/L — ABNORMAL HIGH (ref 3–23)

## 2018-08-16 NOTE — Procedures (Signed)
US thoracentesis without difficulty   Complications:  None  Blood Loss: none  See dictation in canopy pacs  

## 2018-08-17 ENCOUNTER — Other Ambulatory Visit: Payer: Self-pay | Admitting: Oncology

## 2018-08-17 ENCOUNTER — Inpatient Hospital Stay: Payer: 59

## 2018-08-17 ENCOUNTER — Other Ambulatory Visit: Payer: Self-pay | Admitting: *Deleted

## 2018-08-17 VITALS — BP 122/82 | HR 92 | Temp 93.6°F | Resp 20

## 2018-08-17 DIAGNOSIS — J90 Pleural effusion, not elsewhere classified: Secondary | ICD-10-CM

## 2018-08-17 DIAGNOSIS — D500 Iron deficiency anemia secondary to blood loss (chronic): Secondary | ICD-10-CM

## 2018-08-17 DIAGNOSIS — C541 Malignant neoplasm of endometrium: Secondary | ICD-10-CM | POA: Diagnosis not present

## 2018-08-17 DIAGNOSIS — D649 Anemia, unspecified: Secondary | ICD-10-CM

## 2018-08-17 LAB — CBC WITH DIFFERENTIAL/PLATELET
BASOS ABS: 0.2 10*3/uL — AB (ref 0–0.1)
Basophils Relative: 1 %
EOS PCT: 0 %
Eosinophils Absolute: 0 10*3/uL (ref 0–0.7)
HEMATOCRIT: 24.5 % — AB (ref 35.0–47.0)
Hemoglobin: 7.5 g/dL — ABNORMAL LOW (ref 12.0–16.0)
LYMPHS ABS: 0.4 10*3/uL — AB (ref 1.0–3.6)
LYMPHS PCT: 2 %
MCH: 21.9 pg — AB (ref 26.0–34.0)
MCHC: 30.7 g/dL — ABNORMAL LOW (ref 32.0–36.0)
MCV: 71.4 fL — AB (ref 80.0–100.0)
MONO ABS: 0.2 10*3/uL (ref 0.2–0.9)
Monocytes Relative: 2 %
NEUTROS ABS: 14.8 10*3/uL — AB (ref 1.4–6.5)
Neutrophils Relative %: 95 %
Platelets: 506 10*3/uL — ABNORMAL HIGH (ref 150–440)
RBC: 3.43 MIL/uL — AB (ref 3.80–5.20)
RDW: 19.3 % — AB (ref 11.5–14.5)
WBC: 15.6 10*3/uL — ABNORMAL HIGH (ref 3.6–11.0)

## 2018-08-17 LAB — COMPREHENSIVE METABOLIC PANEL
ALT: 13 U/L (ref 0–44)
ANION GAP: 12 (ref 5–15)
AST: 28 U/L (ref 15–41)
Albumin: 2.9 g/dL — ABNORMAL LOW (ref 3.5–5.0)
Alkaline Phosphatase: 58 U/L (ref 38–126)
BUN: 20 mg/dL (ref 6–20)
CHLORIDE: 103 mmol/L (ref 98–111)
CO2: 22 mmol/L (ref 22–32)
Calcium: 8.5 mg/dL — ABNORMAL LOW (ref 8.9–10.3)
Creatinine, Ser: 0.68 mg/dL (ref 0.44–1.00)
GFR calc Af Amer: >60 mL/min (ref >60)
GFR calc non Af Amer: >60 mL/min (ref >60)
GLUCOSE: 108 mg/dL — AB (ref 70–99)
POTASSIUM: 4.4 mmol/L (ref 3.5–5.1)
SODIUM: 137 mmol/L (ref 135–145)
Total Bilirubin: 0.7 mg/dL (ref 0.3–1.2)
Total Protein: 6.4 g/dL — ABNORMAL LOW (ref 6.5–8.1)

## 2018-08-17 LAB — LACTATE DEHYDROGENASE: LDH: 270 U/L — ABNORMAL HIGH (ref 98–192)

## 2018-08-17 LAB — PH, BODY FLUID: PH, BODY FLUID: 7.4

## 2018-08-17 MED ORDER — SODIUM CHLORIDE 0.9 % IV SOLN
Freq: Once | INTRAVENOUS | Status: AC
  Administered 2018-08-17: 09:00:00 via INTRAVENOUS
  Filled 2018-08-17: qty 250

## 2018-08-17 MED ORDER — IRON SUCROSE 20 MG/ML IV SOLN
200.0000 mg | Freq: Once | INTRAVENOUS | Status: AC
  Administered 2018-08-17: 200 mg via INTRAVENOUS
  Filled 2018-08-17: qty 10

## 2018-08-17 MED ORDER — PEGFILGRASTIM INJECTION 6 MG/0.6ML ~~LOC~~
6.0000 mg | PREFILLED_SYRINGE | Freq: Once | SUBCUTANEOUS | Status: AC
  Administered 2018-08-17: 6 mg via SUBCUTANEOUS
  Filled 2018-08-17: qty 0.6

## 2018-08-17 MED ORDER — SODIUM CHLORIDE 0.9 % IV SOLN: 200.0000 mg | Freq: Once | INTRAVENOUS | Status: DC

## 2018-08-17 MED ORDER — HEPARIN SOD (PORK) LOCK FLUSH 100 UNIT/ML IV SOLN
500.0000 [IU] | Freq: Once | INTRAVENOUS | Status: DC | PRN
  Filled 2018-08-17: qty 5

## 2018-08-17 MED ORDER — SODIUM CHLORIDE 0.9% FLUSH
10.0000 mL | Freq: Once | INTRAVENOUS | Status: AC | PRN
  Administered 2018-08-17: 10 mL
  Filled 2018-08-17: qty 10

## 2018-08-17 NOTE — Patient Instructions (Addendum)
Iron Sucrose injection What is this medicine? IRON SUCROSE (AHY ern SOO krohs) is an iron complex. Iron is used to make healthy red blood cells, which carry oxygen and nutrients throughout the body. This medicine is used to treat iron deficiency anemia in people with chronic kidney disease. This medicine may be used for other purposes; ask your health care provider or pharmacist if you have questions. COMMON BRAND NAME(S): Venofer What should I tell my health care provider before I take this medicine? They need to know if you have any of these conditions: -anemia not caused by low iron levels -heart disease -high levels of iron in the blood -kidney disease -liver disease -an unusual or allergic reaction to iron, other medicines, foods, dyes, or preservatives -pregnant or trying to get pregnant -breast-feeding How should I use this medicine? This medicine is for infusion into a vein. It is given by a health care professional in a hospital or clinic setting. Talk to your pediatrician regarding the use of this medicine in children. While this drug may be prescribed for children as young as 2 years for selected conditions, precautions do apply. Overdosage: If you think you have taken too much of this medicine contact a poison control center or emergency room at once. NOTE: This medicine is only for you. Do not share this medicine with others. What if I miss a dose? It is important not to miss your dose. Call your doctor or health care professional if you are unable to keep an appointment. What may interact with this medicine? Do not take this medicine with any of the following medications: -deferoxamine -dimercaprol -other iron products This medicine may also interact with the following medications: -chloramphenicol -deferasirox This list may not describe all possible interactions. Give your health care provider a list of all the medicines, herbs, non-prescription drugs, or dietary  supplements you use. Also tell them if you smoke, drink alcohol, or use illegal drugs. Some items may interact with your medicine. What should I watch for while using this medicine? Visit your doctor or healthcare professional regularly. Tell your doctor or healthcare professional if your symptoms do not start to get better or if they get worse. You may need blood work done while you are taking this medicine. You may need to follow a special diet. Talk to your doctor. Foods that contain iron include: whole grains/cereals, dried fruits, beans, or peas, leafy green vegetables, and organ meats (liver, kidney). What side effects may I notice from receiving this medicine? Side effects that you should report to your doctor or health care professional as soon as possible: -allergic reactions like skin rash, itching or hives, swelling of the face, lips, or tongue -breathing problems -changes in blood pressure -cough -fast, irregular heartbeat -feeling faint or lightheaded, falls -fever or chills -flushing, sweating, or hot feelings -joint or muscle aches/pains -seizures -swelling of the ankles or feet -unusually weak or tired Side effects that usually do not require medical attention (report to your doctor or health care professional if they continue or are bothersome): -diarrhea -feeling achy -headache -irritation at site where injected -nausea, vomiting -stomach upset -tiredness This list may not describe all possible side effects. Call your doctor for medical advice about side effects. You may report side effects to FDA at 1-800-FDA-1088. Where should I keep my medicine? This drug is given in a hospital or clinic and will not be stored at home. NOTE: This sheet is a summary. It may not cover all possible information. If   you have questions about this medicine, talk to your doctor, pharmacist, or health care provider.  2018 Elsevier/Gold Standard (2011-09-22 17:14:35) Pegfilgrastim  injection What is this medicine? PEGFILGRASTIM (PEG fil gra stim) is a long-acting granulocyte colony-stimulating factor that stimulates the growth of neutrophils, a type of white blood cell important in the body's fight against infection. It is used to reduce the incidence of fever and infection in patients with certain types of cancer who are receiving chemotherapy that affects the bone marrow, and to increase survival after being exposed to high doses of radiation. This medicine may be used for other purposes; ask your health care provider or pharmacist if you have questions. COMMON BRAND NAME(S): Neulasta What should I tell my health care provider before I take this medicine? They need to know if you have any of these conditions: -kidney disease -latex allergy -ongoing radiation therapy -sickle cell disease -skin reactions to acrylic adhesives (On-Body Injector only) -an unusual or allergic reaction to pegfilgrastim, filgrastim, other medicines, foods, dyes, or preservatives -pregnant or trying to get pregnant -breast-feeding How should I use this medicine? This medicine is for injection under the skin. If you get this medicine at home, you will be taught how to prepare and give the pre-filled syringe or how to use the On-body Injector. Refer to the patient Instructions for Use for detailed instructions. Use exactly as directed. Tell your healthcare provider immediately if you suspect that the On-body Injector may not have performed as intended or if you suspect the use of the On-body Injector resulted in a missed or partial dose. It is important that you put your used needles and syringes in a special sharps container. Do not put them in a trash can. If you do not have a sharps container, call your pharmacist or healthcare provider to get one. Talk to your pediatrician regarding the use of this medicine in children. While this drug may be prescribed for selected conditions, precautions do  apply. Overdosage: If you think you have taken too much of this medicine contact a poison control center or emergency room at once. NOTE: This medicine is only for you. Do not share this medicine with others. What if I miss a dose? It is important not to miss your dose. Call your doctor or health care professional if you miss your dose. If you miss a dose due to an On-body Injector failure or leakage, a new dose should be administered as soon as possible using a single prefilled syringe for manual use. What may interact with this medicine? Interactions have not been studied. Give your health care provider a list of all the medicines, herbs, non-prescription drugs, or dietary supplements you use. Also tell them if you smoke, drink alcohol, or use illegal drugs. Some items may interact with your medicine. This list may not describe all possible interactions. Give your health care provider a list of all the medicines, herbs, non-prescription drugs, or dietary supplements you use. Also tell them if you smoke, drink alcohol, or use illegal drugs. Some items may interact with your medicine. What should I watch for while using this medicine? You may need blood work done while you are taking this medicine. If you are going to need a MRI, CT scan, or other procedure, tell your doctor that you are using this medicine (On-Body Injector only). What side effects may I notice from receiving this medicine? Side effects that you should report to your doctor or health care professional as soon as possible: -allergic  reactions like skin rash, itching or hives, swelling of the face, lips, or tongue -dizziness -fever -pain, redness, or irritation at site where injected -pinpoint red spots on the skin -red or dark-brown urine -shortness of breath or breathing problems -stomach or side pain, or pain at the shoulder -swelling -tiredness -trouble passing urine or change in the amount of urine Side effects that  usually do not require medical attention (report to your doctor or health care professional if they continue or are bothersome): -bone pain -muscle pain This list may not describe all possible side effects. Call your doctor for medical advice about side effects. You may report side effects to FDA at 1-800-FDA-1088. Where should I keep my medicine? Keep out of the reach of children. Store pre-filled syringes in a refrigerator between 2 and 8 degrees C (36 and 46 degrees F). Do not freeze. Keep in carton to protect from light. Throw away this medicine if it is left out of the refrigerator for more than 48 hours. Throw away any unused medicine after the expiration date. NOTE: This sheet is a summary. It may not cover all possible information. If you have questions about this medicine, talk to your doctor, pharmacist, or health care provider.  2018 Elsevier/Gold Standard (2016-12-08 12:58:03)

## 2018-08-19 LAB — PREPARE RBC (CROSSMATCH)

## 2018-08-20 ENCOUNTER — Inpatient Hospital Stay: Payer: 59

## 2018-08-20 DIAGNOSIS — C541 Malignant neoplasm of endometrium: Secondary | ICD-10-CM | POA: Diagnosis not present

## 2018-08-20 DIAGNOSIS — D649 Anemia, unspecified: Secondary | ICD-10-CM

## 2018-08-20 LAB — ABO/RH: ABO/RH(D): A POS

## 2018-08-20 MED ORDER — SODIUM CHLORIDE 0.9% IV SOLUTION
250.0000 mL | Freq: Once | INTRAVENOUS | Status: AC
  Administered 2018-08-20: 250 mL via INTRAVENOUS
  Filled 2018-08-20: qty 250

## 2018-08-20 MED ORDER — HEPARIN SOD (PORK) LOCK FLUSH 100 UNIT/ML IV SOLN
250.0000 [IU] | INTRAVENOUS | Status: DC | PRN
  Filled 2018-08-20: qty 5

## 2018-08-20 MED ORDER — HEPARIN SOD (PORK) LOCK FLUSH 100 UNIT/ML IV SOLN
500.0000 [IU] | Freq: Every day | INTRAVENOUS | Status: AC | PRN
  Administered 2018-08-20: 500 [IU]

## 2018-08-20 MED ORDER — ACETAMINOPHEN 325 MG PO TABS
650.0000 mg | ORAL_TABLET | Freq: Once | ORAL | Status: DC
  Filled 2018-08-20: qty 2

## 2018-08-20 MED ORDER — DIPHENHYDRAMINE HCL 25 MG PO CAPS
25.0000 mg | ORAL_CAPSULE | Freq: Once | ORAL | Status: AC
  Administered 2018-08-20: 25 mg via ORAL
  Filled 2018-08-20: qty 1

## 2018-08-21 ENCOUNTER — Other Ambulatory Visit: Payer: Self-pay | Admitting: Oncology

## 2018-08-21 ENCOUNTER — Other Ambulatory Visit: Payer: Self-pay

## 2018-08-21 DIAGNOSIS — D649 Anemia, unspecified: Secondary | ICD-10-CM

## 2018-08-21 LAB — TYPE AND SCREEN
ABO/RH(D): A POS
ANTIBODY SCREEN: NEGATIVE
UNIT DIVISION: 0
UNIT DIVISION: 0
Unit division: 0

## 2018-08-21 LAB — BPAM RBC
BLOOD PRODUCT EXPIRATION DATE: 201908292359
BLOOD PRODUCT EXPIRATION DATE: 201909022359
Blood Product Expiration Date: 201909192359
ISSUE DATE / TIME: 201908261024
ISSUE DATE / TIME: 201908261106
UNIT TYPE AND RH: 6200
Unit Type and Rh: 600
Unit Type and Rh: 9500

## 2018-08-22 ENCOUNTER — Inpatient Hospital Stay: Payer: 59

## 2018-08-22 ENCOUNTER — Other Ambulatory Visit: Payer: Self-pay

## 2018-08-22 ENCOUNTER — Ambulatory Visit
Admission: RE | Admit: 2018-08-22 | Discharge: 2018-08-22 | Disposition: A | Discharge status: Home / Self Care | Payer: 59 | Source: Ambulatory Visit | Attending: Interventional Radiology | Admitting: Interventional Radiology

## 2018-08-22 ENCOUNTER — Ambulatory Visit
Admission: RE | Admit: 2018-08-22 | Discharge: 2018-08-22 | Disposition: A | Discharge status: Home / Self Care | Payer: 59 | Source: Ambulatory Visit | Attending: Oncology | Admitting: Oncology

## 2018-08-22 ENCOUNTER — Inpatient Hospital Stay (HOSPITAL_BASED_OUTPATIENT_CLINIC_OR_DEPARTMENT_OTHER): Payer: 59 | Admitting: Oncology

## 2018-08-22 ENCOUNTER — Encounter: Payer: Self-pay | Admitting: Oncology

## 2018-08-22 VITALS — BP 116/82 | HR 110 | Temp 96.8°F | Wt 232.4 lb

## 2018-08-22 DIAGNOSIS — D500 Iron deficiency anemia secondary to blood loss (chronic): Secondary | ICD-10-CM | POA: Diagnosis not present

## 2018-08-22 DIAGNOSIS — F418 Other specified anxiety disorders: Secondary | ICD-10-CM

## 2018-08-22 DIAGNOSIS — I081 Rheumatic disorders of both mitral and tricuspid valves: Secondary | ICD-10-CM | POA: Diagnosis not present

## 2018-08-22 DIAGNOSIS — D649 Anemia, unspecified: Secondary | ICD-10-CM

## 2018-08-22 DIAGNOSIS — R7989 Other specified abnormal findings of blood chemistry: Secondary | ICD-10-CM

## 2018-08-22 DIAGNOSIS — Z9889 Other specified postprocedural states: Secondary | ICD-10-CM

## 2018-08-22 DIAGNOSIS — Z8041 Family history of malignant neoplasm of ovary: Secondary | ICD-10-CM

## 2018-08-22 DIAGNOSIS — J918 Pleural effusion in other conditions classified elsewhere: Secondary | ICD-10-CM | POA: Diagnosis not present

## 2018-08-22 DIAGNOSIS — N1330 Unspecified hydronephrosis: Secondary | ICD-10-CM

## 2018-08-22 DIAGNOSIS — Z48813 Encounter for surgical aftercare following surgery on the respiratory system: Secondary | ICD-10-CM | POA: Insufficient documentation

## 2018-08-22 DIAGNOSIS — G4700 Insomnia, unspecified: Secondary | ICD-10-CM

## 2018-08-22 DIAGNOSIS — J90 Pleural effusion, not elsewhere classified: Secondary | ICD-10-CM

## 2018-08-22 DIAGNOSIS — M7989 Other specified soft tissue disorders: Secondary | ICD-10-CM

## 2018-08-22 DIAGNOSIS — C541 Malignant neoplasm of endometrium: Secondary | ICD-10-CM | POA: Diagnosis not present

## 2018-08-22 LAB — COMPREHENSIVE METABOLIC PANEL
ALT: 53 U/L — ABNORMAL HIGH (ref 0–44)
ANION GAP: 14 (ref 5–15)
AST: 68 U/L — ABNORMAL HIGH (ref 15–41)
Albumin: 2.9 g/dL — ABNORMAL LOW (ref 3.5–5.0)
Alkaline Phosphatase: 103 U/L (ref 38–126)
BUN: 12 mg/dL (ref 6–20)
CHLORIDE: 104 mmol/L (ref 98–111)
CO2: 20 mmol/L — ABNORMAL LOW (ref 22–32)
Calcium: 8.5 mg/dL — ABNORMAL LOW (ref 8.9–10.3)
Creatinine, Ser: 0.84 mg/dL (ref 0.44–1.00)
GFR calc Af Amer: >60 mL/min (ref >60)
Glucose, Bld: 115 mg/dL — ABNORMAL HIGH (ref 70–99)
POTASSIUM: 3.7 mmol/L (ref 3.5–5.1)
Sodium: 138 mmol/L (ref 135–145)
Total Bilirubin: 0.7 mg/dL (ref 0.3–1.2)
Total Protein: 6.3 g/dL — ABNORMAL LOW (ref 6.5–8.1)

## 2018-08-22 LAB — CBC WITH DIFFERENTIAL/PLATELET
BASOS ABS: 0.1 10*3/uL (ref 0–0.1)
BASOS PCT: 1 %
EOS PCT: 8 %
Eosinophils Absolute: 1.2 10*3/uL — ABNORMAL HIGH (ref 0–0.7)
HCT: 28.2 % — ABNORMAL LOW (ref 35.0–47.0)
Hemoglobin: 8.9 g/dL — ABNORMAL LOW (ref 12.0–16.0)
Lymphocytes Relative: 15 %
Lymphs Abs: 2.3 10*3/uL (ref 1.0–3.6)
MCH: 23.3 pg — ABNORMAL LOW (ref 26.0–34.0)
MCHC: 31.5 g/dL — AB (ref 32.0–36.0)
MCV: 74 fL — AB (ref 80.0–100.0)
MONO ABS: 0.8 10*3/uL (ref 0.2–0.9)
MONOS PCT: 5 %
Neutro Abs: 11.6 10*3/uL — ABNORMAL HIGH (ref 1.4–6.5)
Neutrophils Relative %: 71 %
PLATELETS: 248 10*3/uL (ref 150–440)
RBC: 3.81 MIL/uL (ref 3.80–5.20)
RDW: 20.4 % — AB (ref 11.5–14.5)
WBC: 16 10*3/uL — ABNORMAL HIGH (ref 3.6–11.0)

## 2018-08-22 LAB — PATHOLOGIST SMEAR REVIEW

## 2018-08-22 LAB — BODY FLUID CELL COUNT WITH DIFFERENTIAL
Eos, Fluid: 0 %
LYMPHS FL: 22 %
Monocyte-Macrophage-Serous Fluid: 53 %
NEUTROPHIL FLUID: 25 %
Other Cells, Fluid: 0 %
WBC FLUID: 580 uL

## 2018-08-22 LAB — LACTATE DEHYDROGENASE, PLEURAL OR PERITONEAL FLUID: LD, Fluid: 379 U/L — ABNORMAL HIGH (ref 3–23)

## 2018-08-22 LAB — SAMPLE TO BLOOD BANK

## 2018-08-22 LAB — PROTEIN, PLEURAL OR PERITONEAL FLUID: TOTAL PROTEIN, FLUID: 3.6 g/dL

## 2018-08-22 LAB — AMYLASE, PLEURAL OR PERITONEAL FLUID: Amylase, Fluid: 33 U/L

## 2018-08-22 MED ORDER — SODIUM CHLORIDE 0.9% FLUSH
10.0000 mL | INTRAVENOUS | Status: DC | PRN
  Administered 2018-08-22: 10 mL via INTRAVENOUS
  Filled 2018-08-22: qty 10

## 2018-08-22 MED ORDER — POTASSIUM CHLORIDE CRYS ER 20 MEQ PO TBCR: 20.0000 meq | EXTENDED_RELEASE_TABLET | Freq: Every day | ORAL | 0 refills | Status: DC | PRN

## 2018-08-22 MED ORDER — FUROSEMIDE 20 MG PO TABS: 20.0000 mg | ORAL_TABLET | Freq: Every day | ORAL | 0 refills | Status: DC | PRN

## 2018-08-22 MED ORDER — SODIUM CHLORIDE 0.9 % IV SOLN
Freq: Once | INTRAVENOUS | Status: AC
  Administered 2018-08-22: 10:00:00 via INTRAVENOUS
  Filled 2018-08-22: qty 250

## 2018-08-22 MED ORDER — SODIUM CHLORIDE 0.9 % IV SOLN: 200.0000 mg | Freq: Once | INTRAVENOUS | Status: DC

## 2018-08-22 MED ORDER — IRON SUCROSE 20 MG/ML IV SOLN
200.0000 mg | Freq: Once | INTRAVENOUS | Status: AC
  Administered 2018-08-22: 200 mg via INTRAVENOUS
  Filled 2018-08-22: qty 10

## 2018-08-22 MED ORDER — HEPARIN SOD (PORK) LOCK FLUSH 100 UNIT/ML IV SOLN
500.0000 [IU] | Freq: Once | INTRAVENOUS | Status: AC
  Administered 2018-08-22: 500 [IU] via INTRAVENOUS
  Filled 2018-08-22: qty 5

## 2018-08-22 NOTE — Progress Notes (Signed)
Patient here today for follow up.  Patient c/o SOB

## 2018-08-22 NOTE — Procedures (Signed)
Interventional Radiology Procedure Note  Procedure: US guided right thoracentesis  Complications: None  Estimated Blood Loss: None  Findings: 3 L of amber colored fluid removed from right pleural space.  Venetia Night. Kathlene Cote, M.D Pager:  [phone]

## 2018-08-22 NOTE — Progress Notes (Signed)
Hematology/Oncology follow up  note [facility] Telephone:([phone]) [phone] Fax:(336) [phone]   Patient Care Team: Virginia Crews, MD as PCP - General (Family Medicine) Clent Jacks, RN as Registered Nurse  REFERRING PROVIDER: Dr.Berchuck CHIEF COMPLAINTS/REASON FOR VISIT:  Evaluation of endometrial cancer.   HISTORY OF PRESENTING ILLNESS:  Karen Mann is a  57 y.o.  female with PMH listed below who was referred to me for evaluation of newly diagnosed endometrial cancer. Patient was recently referred to GYN Dr. Gilman Schmidt for abnormal uterine bleeding. 07/23/2018 ultrasound was performed showed large pelvic mass 20 x 13 x 22 cm with cystic and solid components.  Ultrasound also showed 4.9 cm fibroid and thickened endometrium 15 mm thickness 08/05/2018 MRI pelvis with and without contrast 20.6 cm complex cystic solid mass in the pelvis with numerous thickened enhancing septations, compatible with malignant ovarian neoplasm.  Neither ovary was discretely visualized.  Small volume of ascites.  Cross paratonia disease is not visualized but not excluded.  Left hydro-ureteronephrosis secondary to extrinsic compression. 5.3 intramural anterior uterine body fibroid.  #Endometrial biopsy was performed.  Pathology showed endometrioid adenocarcinoma consistent with FIGO grade 3.  8/12/19CA125 was elevated at 622 Patient was referred to see Dr. Fransisca Connors.  She was evaluated by Dr. Meredith Mody on 08/08/2018.  Staging CT was obtained. #Images independently reviewed by me.   Staging CT showed large cystic and a solid mass likely arising from the ovaries, measures 369 0 cc, suspicious for ovarian malignancy.  Moderate ascites noted.  Raising concern for malignant peritoneal spread.  No well-defined omental caking or solid tumor deposits along a situs., -Large right pleural effusion over half of right hemithorax volume. -Liver hypodensity 2.8 x 2.8 cm -Moderate left and mild right  hydronephrosis attributed to the ureteral compression due to mass. -Other chronic image findings.  MRI pelvis with and without contrast again showed 20.6 cm complex cystic/solid mass in the pelvis.  Compatible with malignant ovarian neoplasm.  Small volume of a situs.  Cross peritoneal disease is not visualized but cannot be excluded.  Left hydroureteronephrosis secondary to compression.  #Patient was scheduled to have a diagnostic and therapeutic thoracentesis this morning.  Cytology pending. She feels breathing is much better after the thoracentesis. Mild shortness of breath, worse with exertion.  Heart palpitation as well denies any chest pain, abdominal pain. Ongoing vaginal bleeding, she passed a big clot today. Feel extremely fatigued. She feels overwhelming and very nervous about the cancer diagnosis.  She has insomnia and she has tried melatonin which did not help with the sleep.  # Dr. Fransisca Connors recommends systemic therapy with carboplatin and paclitaxel 3 cycles, reassessment for surgical evaluation.per Dr.Berchuck, not candidate for Pembrolizumab and Levetinib trial.    INTERVAL HISTORY Karen Mann is a 57 y.o. female who has above history reviewed by me today for follow-up visit for management of endometrial cancer, ovarian metastasis, ascites, pleural effusion and/or concomitant ovarian malignancies.  Status post thoracentesis on 08/10/2018 with 1.2 L fluid removed.  Cytology negative for malignancy. 08/16/2018 status post thoracentesis again and removed 2.2 L of mildly bloody fluid.  I discussed with Dr. Dicie Beam and she reviewed the slides there was no malignancy cells. Pleural fluid LDH 246, albumin 2.5, protein 4.2 Serum LDH 270.  Protein level 6.4  # fatigue: Continues to feel extremely fatigued and tired.    #Vaginal bleeding, reports that she stopped vaginal bleeding for about 2 days however restarted to have bleeding and passing clots.   #Shortness  of breath, worsened,  cannot lay flat last night. Continue to have lower extremity swelling.  #Abdominal pain, resolved after taking laxatives in the past bowel movements.  Review of Systems  Constitutional: Positive for malaise/fatigue. Negative for chills, fever and weight loss.  HENT: Negative for nosebleeds and sore throat.   Eyes: Negative for double vision, photophobia and redness.  Respiratory: Positive for shortness of breath. Negative for cough and wheezing.   Cardiovascular: Positive for palpitations. Negative for chest pain and orthopnea.  Gastrointestinal: Negative for abdominal pain, blood in stool, nausea and vomiting.       Abdominal distention  Genitourinary: Negative for dysuria.  Musculoskeletal: Positive for back pain. Negative for myalgias and neck pain.       Chronic back pain  Skin: Negative for itching and rash.  Neurological: Negative for dizziness, tingling and tremors.  Endo/Heme/Allergies: Negative for environmental allergies. Does not bruise/bleed easily.  Psychiatric/Behavioral: Negative for depression. The patient is not nervous/anxious.     MEDICAL HISTORY:  Past Medical History:  Diagnosis Date  . Allergy   . Anemia 07/20/2018  . Endometrial adenocarcinoma ([place]) 07/2018  . Fibroid uterus 07/24/2018   5cm/2 inch   . Ovarian mass 07/24/2018   22cm/10 inch  . Pelvic mass     SURGICAL HISTORY: Past Surgical History:  Procedure Laterality Date  . EYE SURGERY  2000   Lasik  . PORTA CATH INSERTION N/A 08/13/2018   Procedure: PORTA CATH INSERTION;  Surgeon: Algernon Huxley, MD;  Location: [facility];  Service: Cardiovascular;  Laterality: N/A;    SOCIAL HISTORY: Social History   Socioeconomic History  . Marital status: Married    Spouse name: Not on file  . Number of children: Not on file  . Years of education: Not on file  . Highest education level: Not on file  Occupational History  . Not on file  Social Needs  . Financial resource strain: Not on file    . Food insecurity:    Worry: Not on file    Inability: Not on file  . Transportation needs:    Medical: Not on file    Non-medical: Not on file  Tobacco Use  . Smoking status: Never Smoker  . Smokeless tobacco: Never Used  Substance and Sexual Activity  . Alcohol use: No  . Drug use: No  . Sexual activity: Not Currently    Birth control/protection: Post-menopausal  Lifestyle  . Physical activity:    Days per week: Not on file    Minutes per session: Not on file  . Stress: Not on file  Relationships  . Social connections:    Talks on phone: Not on file    Gets together: Not on file    Attends religious service: Not on file    Active member of club or organization: Not on file    Attends meetings of clubs or organizations: Not on file    Relationship status: Not on file  . Intimate partner violence:    Fear of current or ex partner: Not on file    Emotionally abused: Not on file    Physically abused: Not on file    Forced sexual activity: Not on file  Other Topics Concern  . Not on file  Social History Narrative  . Not on file    FAMILY HISTORY: Family History  Problem Relation Age of Onset  . Hypertension Father   . Ovarian cancer Mother   . Healthy Brother   .  Ovarian cancer Maternal Aunt     ALLERGIES:  is allergic to penicillins.  MEDICATIONS:  Current Outpatient Medications  Medication Sig Dispense Refill  . ALPRAZolam (XANAX) 0.25 MG tablet Take 1 tablet (0.25 mg total) by mouth at bedtime as needed for anxiety or sleep. 20 tablet 0  . dexamethasone (DECADRON) 4 MG tablet Take 2 tablets (8 mg total) by mouth daily. Start the day after chemotherapy for 2 days. 30 tablet 1  . ferrous sulfate 325 (65 FE) MG tablet Take 325 mg by mouth daily with breakfast.    . lidocaine-prilocaine (EMLA) cream Apply to affected area once 30 g 3  . naproxen (NAPROSYN) 500 MG tablet Take 1 tablet (500 mg total) by mouth 2 (two) times daily with a meal. 60 tablet 2  .  ondansetron (ZOFRAN) 8 MG tablet Take 1 tablet (8 mg total) by mouth 2 (two) times daily as needed for refractory nausea / vomiting. Start on day 3 after chemo. 30 tablet 1  . Polyethylene Glycol 3350 (MIRALAX PO) Take by mouth daily.     . prochlorperazine (COMPAZINE) 10 MG tablet Take 1 tablet (10 mg total) by mouth every 6 (six) hours as needed (Nausea or vomiting). 30 tablet 1  . rOPINIRole (REQUIP) 0.25 MG tablet Take 1 tablet (0.25 mg total) by mouth at bedtime. Take 1-3 hours before bedtime 30 tablet 3   No current facility-administered medications for this visit.    Facility-Administered Medications Ordered in Other Visits  Medication Dose Route Frequency Provider Last Rate Last Dose  . heparin lock flush 100 unit/mL  500 Units Intravenous Once Earlie Server, MD      . iron sucrose (VENOFER) injection 200 mg  200 mg Intravenous Once Earlie Server, MD   200 mg at 08/22/18 0957  . sodium chloride flush (NS) 0.9 % injection 10 mL  10 mL Intravenous PRN Earlie Server, MD   10 mL at 08/22/18 0830     PHYSICAL EXAMINATION: ECOG PERFORMANCE STATUS: 1 - Symptomatic but completely ambulatory Vitals:   08/22/18 0955  BP: 116/82  Pulse: (!) 110  Temp: (!) 96.8 F (36 C)  SpO2: 91%   Filed Weights   08/22/18 0955  Weight: 232 lb 6 oz (105.4 kg)    Physical Exam  Constitutional: She is oriented to person, place, and time. She appears well-developed and well-nourished. No distress.  HENT:  Head: Normocephalic and atraumatic.  Right Ear: External ear normal.  Left Ear: External ear normal.  Mouth/Throat: Oropharynx is clear and moist.  Eyes: Pupils are equal, round, and reactive to light. EOM are normal. No scleral icterus.  Pale conjunctivae  Neck: Normal range of motion. Neck supple.  Cardiovascular: Normal rate, regular rhythm and normal heart sounds.  Pulmonary/Chest: Effort normal. No respiratory distress. She has no wheezes.  Decreased breath sounds on right.  Abdominal: Soft. Bowel sounds  are normal. She exhibits mass. She exhibits no distension. There is no tenderness.  Palpable lower abdominal/pelvis mass  Musculoskeletal: Normal range of motion. She exhibits edema. She exhibits no deformity.  Bilateral 2+ pitting edema  Neurological: She is alert and oriented to person, place, and time. No cranial nerve deficit. Coordination normal.  Skin: Skin is warm and dry. No rash noted. No erythema.  Psychiatric: She has a normal mood and affect. Her behavior is normal. Thought content normal.     LABORATORY DATA:  I have reviewed the data as listed Lab Results  Component Value Date   WBC 16.0 (  H) 08/22/2018   HGB 8.9 (L) 08/22/2018   HCT 28.2 (L) 08/22/2018   MCV 74.0 (L) 08/22/2018   PLT 248 08/22/2018   Recent Labs    08/15/18 0903 08/17/18 0900 08/22/18 0834  NA 134* 137 138  K 3.9 4.4 3.7  CL 105 103 104  CO2 21* 22 20*  GLUCOSE 95 108* 115*  BUN 12 20 12   CREATININE 0.72 0.68 0.84  CALCIUM 8.5* 8.5* 8.5*  GFRNONAA >60 >60 >60  GFRAA >60 >60 >60  PROT 6.9 6.4* 6.3*  ALBUMIN 3.1* 2.9* 2.9*  AST 17 28 68*  ALT 8 13 53*  ALKPHOS 63 58 103  BILITOT 0.6 0.7 0.7   Iron/TIBC/Ferritin/ %Sat    Component Value Date/Time   IRON 11 (L) 07/17/2018 1124   TIBC 312 07/17/2018 1124   FERRITIN 60 07/17/2018 1124   IRONPCTSAT 4 (LL) 07/17/2018 1124        ASSESSMENT & PLAN:  1. Symptomatic anemia   2. Pleural effusion   3. Iron deficiency anemia due to chronic blood loss   4. Endometrial adenocarcinoma ([place])    #  FIGO grade 3 endometrial adenocarcinoma, clinically with extrauterine disease including possible ovarian metastasis, Ascites, pleural effusion, and/or concomitant ovarian malignancies.  Status post 1 dose of carboplatin and Taxol treatment. Overall tolerates chemotherapy well.  #Severe iron deficiency due to chronic blood loss.  Status post 2 treatments of IV Venofer 200 mg.  Proceed with third treatment of Venofer today. #Symptomatic anemia,  status post 1 PRBC transfusion this week.  Hemoglobin improved to 8.9.  #Recurrent pleural effusion, cytology negative.  Will arrange ultrasound-guided thoracentesis of right side again today.  We will repeat cytology and other pleural fluid labs.  # Thrombocytosis: Due to severe iron deficiency. # Hydronephrosis, creatinine normal.  Continue close monitor. # Anxiety, insomnia, melatonin not helpful.  Reports Xanax 0.25 mg not helping with her sleep.  She takes Benadryl at night which is helpful. Marland Kitchen#Refer to genetic counselor for genetic testing.  Family history of ovarian cancer and personal history of endometrial cancer. Genetic testing was sent and pending result.  #Lower extremity swelling, obtain 2D echo to evaluate her baseline cardiac function.  Lasix 20 mg as needed if swelling unbearable.  Also advised patient to take potassium 20 mEq on the days that he takes Lasix.   Orders Placed This Encounter  Procedures  . US THORACENTESIS ASP PLEURAL SPACE W/IMG GUIDE    Standing Status:   Future    Standing Expiration Date:   10/23/2019    Order Specific Question:   Are labs required for specimen collection?    Answer:   Yes    Order Specific Question:   Lab orders requested (DO NOT place separate lab orders, these will be automatically ordered during procedure specimen collection):    Answer:   Cytology - Non Pap    Order Specific Question:   Lab orders requested (DO NOT place separate lab orders, these will be automatically ordered during procedure specimen collection):    Answer:   Comp Panel: Leukemia/Lymphoma    Order Specific Question:   Lab orders requested (DO NOT place separate lab orders, these will be automatically ordered during procedure specimen collection):    Answer:   Amylase, Body Fluid    Order Specific Question:   Lab orders requested (DO NOT place separate lab orders, these will be automatically ordered during procedure specimen collection):    Answer:   Protein,  Pleural Or Peritoneal  Fluid    Order Specific Question:   Lab orders requested (DO NOT place separate lab orders, these will be automatically ordered during procedure specimen collection):    Answer:   Lactate Dehydrogenase, Body Fluid    Order Specific Question:   Lab orders requested (DO NOT place separate lab orders, these will be automatically ordered during procedure specimen collection):    Answer:   Protein, Body Fluid    Order Specific Question:   Lab orders requested (DO NOT place separate lab orders, these will be automatically ordered during procedure specimen collection):    Answer:   Body Fluid Cell Count With Differential    Order Specific Question:   Reason for Exam (SYMPTOM  OR DIAGNOSIS REQUIRED)    Answer:   SOB    Order Specific Question:   Preferred imaging location?    Answer:   [facility]  . Sample to Blood Bank    Standing Status:   Future    Standing Expiration Date:   08/23/2019  . Sample to Blood Bank    Standing Status:   Future    Standing Expiration Date:   08/23/2019    All questions were answered. The patient knows to call the clinic with any problems questions or concerns.  Return of visit: 1 WEEK for follow-up and 2 weeks for assessment prior to start cycle 2 chemotherapy treatment. Total face to face encounter time for this patient visit was 25in. >50% of the time was  spent in counseling and coordination of care.  Earlie Server, MD, PhD Hematology Oncology [facility] Pager- [phone] 08/22/2018

## 2018-08-22 NOTE — Discharge Instructions (Signed)
Thoracentesis, Care After °Refer to this sheet in the next few weeks. These instructions provide you with information about caring for yourself after your procedure. Your health care provider may also give you more specific instructions. Your treatment has been planned according to current medical practices, but problems sometimes occur. Call your health care provider if you have any problems or questions after your procedure. °What can I expect after the procedure? °After your procedure, it is common to have pain at the puncture site. °Follow these instructions at home: °· Take medicines only as directed by your health care provider. °· You may return to your normal diet and normal activities as directed by your health care provider. °· Drink enough fluid to keep your urine clear or pale yellow. °· Do not take baths, swim, or use a hot tub until your health care provider approves. °· Follow your health care provider's instructions about: °? Puncture site care. °? Bandage (dressing) changes and removal. °· Check your puncture site every day for signs of infection. Watch for: °? Redness, swelling, or pain. °? Fluid, blood, or pus. °· Keep all follow-up visits as directed by your health care provider. This is important. °Contact a health care provider if: °· You have redness, swelling, or pain at your puncture site. °· You have fluid, blood, or pus coming from your puncture site. °· You have a fever. °· You have chills. °· You have nausea or vomiting. °· You have trouble breathing. °· You develop a worsening cough. °Get help right away if: °· You have extreme shortness of breath. °· You develop chest pain. °· You faint or feel light-headed. °This information is not intended to replace advice given to you by your health care provider. Make sure you discuss any questions you have with your health care provider. °Document Released: 01/02/2015 Document Revised: 08/13/2016 Document Reviewed: 09/23/2014 °Elsevier  Interactive Patient Education © 2018 [employer] ° °

## 2018-08-23 ENCOUNTER — Ambulatory Visit
Admission: RE | Admit: 2018-08-23 | Discharge: 2018-08-23 | Disposition: A | Discharge status: Home / Self Care | Payer: 59 | Source: Ambulatory Visit | Attending: Oncology | Admitting: Oncology

## 2018-08-23 DIAGNOSIS — I081 Rheumatic disorders of both mitral and tricuspid valves: Secondary | ICD-10-CM | POA: Diagnosis not present

## 2018-08-23 DIAGNOSIS — R079 Chest pain, unspecified: Secondary | ICD-10-CM | POA: Diagnosis not present

## 2018-08-23 DIAGNOSIS — Z48813 Encounter for surgical aftercare following surgery on the respiratory system: Secondary | ICD-10-CM | POA: Diagnosis not present

## 2018-08-23 DIAGNOSIS — J90 Pleural effusion, not elsewhere classified: Secondary | ICD-10-CM | POA: Diagnosis not present

## 2018-08-23 DIAGNOSIS — R0602 Shortness of breath: Secondary | ICD-10-CM | POA: Diagnosis not present

## 2018-08-23 LAB — CYTOLOGY - NON PAP

## 2018-08-23 NOTE — Progress Notes (Signed)
*  PRELIMINARY RESULTS* Echocardiogram 2D Echocardiogram has been performed.  Karen Mann 08/23/2018, 12:00 PM

## 2018-08-28 LAB — COMP PANEL: LEUKEMIA/LYMPHOMA

## 2018-08-29 ENCOUNTER — Inpatient Hospital Stay (HOSPITAL_BASED_OUTPATIENT_CLINIC_OR_DEPARTMENT_OTHER): Payer: 59 | Admitting: Oncology

## 2018-08-29 ENCOUNTER — Encounter: Payer: Self-pay | Admitting: Oncology

## 2018-08-29 ENCOUNTER — Inpatient Hospital Stay: Payer: 59 | Attending: Oncology

## 2018-08-29 ENCOUNTER — Inpatient Hospital Stay: Payer: 59

## 2018-08-29 ENCOUNTER — Other Ambulatory Visit: Payer: Self-pay

## 2018-08-29 VITALS — BP 123/83 | HR 101 | Temp 96.7°F | Resp 18

## 2018-08-29 VITALS — BP 102/79 | HR 100 | Resp 20

## 2018-08-29 DIAGNOSIS — G4700 Insomnia, unspecified: Secondary | ICD-10-CM | POA: Diagnosis not present

## 2018-08-29 DIAGNOSIS — J90 Pleural effusion, not elsewhere classified: Secondary | ICD-10-CM | POA: Diagnosis not present

## 2018-08-29 DIAGNOSIS — D259 Leiomyoma of uterus, unspecified: Secondary | ICD-10-CM | POA: Diagnosis not present

## 2018-08-29 DIAGNOSIS — Z79899 Other long term (current) drug therapy: Secondary | ICD-10-CM

## 2018-08-29 DIAGNOSIS — R188 Other ascites: Secondary | ICD-10-CM | POA: Insufficient documentation

## 2018-08-29 DIAGNOSIS — C541 Malignant neoplasm of endometrium: Secondary | ICD-10-CM | POA: Insufficient documentation

## 2018-08-29 DIAGNOSIS — D649 Anemia, unspecified: Secondary | ICD-10-CM

## 2018-08-29 DIAGNOSIS — F409 Phobic anxiety disorder, unspecified: Secondary | ICD-10-CM

## 2018-08-29 DIAGNOSIS — M7989 Other specified soft tissue disorders: Secondary | ICD-10-CM

## 2018-08-29 DIAGNOSIS — N939 Abnormal uterine and vaginal bleeding, unspecified: Secondary | ICD-10-CM

## 2018-08-29 DIAGNOSIS — N1330 Unspecified hydronephrosis: Secondary | ICD-10-CM

## 2018-08-29 DIAGNOSIS — Z7689 Persons encountering health services in other specified circumstances: Secondary | ICD-10-CM | POA: Insufficient documentation

## 2018-08-29 DIAGNOSIS — F419 Anxiety disorder, unspecified: Secondary | ICD-10-CM | POA: Diagnosis not present

## 2018-08-29 DIAGNOSIS — F5105 Insomnia due to other mental disorder: Secondary | ICD-10-CM

## 2018-08-29 DIAGNOSIS — D500 Iron deficiency anemia secondary to blood loss (chronic): Secondary | ICD-10-CM | POA: Insufficient documentation

## 2018-08-29 DIAGNOSIS — Z8041 Family history of malignant neoplasm of ovary: Secondary | ICD-10-CM | POA: Insufficient documentation

## 2018-08-29 DIAGNOSIS — Z5111 Encounter for antineoplastic chemotherapy: Secondary | ICD-10-CM | POA: Diagnosis not present

## 2018-08-29 DIAGNOSIS — R5383 Other fatigue: Secondary | ICD-10-CM | POA: Insufficient documentation

## 2018-08-29 LAB — CBC WITH DIFFERENTIAL/PLATELET
BASOS PCT: 1 %
Basophils Absolute: 0.2 10*3/uL — ABNORMAL HIGH (ref 0–0.1)
EOS ABS: 0.1 10*3/uL (ref 0–0.7)
Eosinophils Relative: 1 %
HCT: 31.3 % — ABNORMAL LOW (ref 35.0–47.0)
HEMOGLOBIN: 9.7 g/dL — AB (ref 12.0–16.0)
Lymphocytes Relative: 13 %
Lymphs Abs: 1.9 10*3/uL (ref 1.0–3.6)
MCH: 24 pg — ABNORMAL LOW (ref 26.0–34.0)
MCHC: 30.9 g/dL — AB (ref 32.0–36.0)
MCV: 77.7 fL — ABNORMAL LOW (ref 80.0–100.0)
MONOS PCT: 5 %
Monocytes Absolute: 0.6 10*3/uL (ref 0.2–0.9)
Neutro Abs: 11.6 10*3/uL — ABNORMAL HIGH (ref 1.4–6.5)
Neutrophils Relative %: 80 %
PLATELETS: 229 10*3/uL (ref 150–440)
RBC: 4.02 MIL/uL (ref 3.80–5.20)
RDW: 24.6 % — AB (ref 11.5–14.5)
WBC: 14.4 10*3/uL — ABNORMAL HIGH (ref 3.6–11.0)

## 2018-08-29 LAB — COMPREHENSIVE METABOLIC PANEL
ALK PHOS: 97 U/L (ref 38–126)
ALT: 12 U/L (ref 0–44)
AST: 17 U/L (ref 15–41)
Albumin: 3.2 g/dL — ABNORMAL LOW (ref 3.5–5.0)
Anion gap: 10 (ref 5–15)
BUN: 11 mg/dL (ref 6–20)
CALCIUM: 8.7 mg/dL — AB (ref 8.9–10.3)
CO2: 22 mmol/L (ref 22–32)
CREATININE: 0.91 mg/dL (ref 0.44–1.00)
Chloride: 104 mmol/L (ref 98–111)
GFR calc Af Amer: >60 mL/min (ref >60)
Glucose, Bld: 103 mg/dL — ABNORMAL HIGH (ref 70–99)
Potassium: 4 mmol/L (ref 3.5–5.1)
SODIUM: 136 mmol/L (ref 135–145)
Total Bilirubin: 0.8 mg/dL (ref 0.3–1.2)
Total Protein: 7 g/dL (ref 6.5–8.1)

## 2018-08-29 LAB — SAMPLE TO BLOOD BANK

## 2018-08-29 MED ORDER — SODIUM CHLORIDE 0.9 % IV SOLN: 200.0000 mg | Freq: Once | INTRAVENOUS | Status: DC

## 2018-08-29 MED ORDER — IRON SUCROSE 20 MG/ML IV SOLN
200.0000 mg | Freq: Once | INTRAVENOUS | Status: AC
  Administered 2018-08-29: 200 mg via INTRAVENOUS
  Filled 2018-08-29: qty 10

## 2018-08-29 MED ORDER — SODIUM CHLORIDE 0.9 % IV SOLN
Freq: Once | INTRAVENOUS | Status: AC
  Administered 2018-08-29: 15:00:00 via INTRAVENOUS
  Filled 2018-08-29: qty 250

## 2018-08-29 MED ORDER — HEPARIN SOD (PORK) LOCK FLUSH 100 UNIT/ML IV SOLN
500.0000 [IU] | Freq: Once | INTRAVENOUS | Status: AC
  Administered 2018-08-29: 500 [IU] via INTRAVENOUS

## 2018-08-29 MED ORDER — OXYCODONE HCL 5 MG PO TABS: 5.0000 mg | ORAL_TABLET | Freq: Four times a day (QID) | ORAL | 0 refills | Status: DC | PRN

## 2018-08-29 NOTE — Progress Notes (Signed)
Patient here for follow up

## 2018-08-30 ENCOUNTER — Other Ambulatory Visit: Payer: Self-pay | Admitting: Oncology

## 2018-08-30 ENCOUNTER — Ambulatory Visit
Admission: RE | Admit: 2018-08-30 | Discharge: 2018-08-30 | Disposition: A | Discharge status: Home / Self Care | Payer: 59 | Source: Ambulatory Visit | Attending: Oncology | Admitting: Oncology

## 2018-08-30 ENCOUNTER — Ambulatory Visit
Admission: RE | Admit: 2018-08-30 | Discharge: 2018-08-30 | Disposition: A | Discharge status: Home / Self Care | Payer: 59 | Source: Ambulatory Visit | Attending: Interventional Radiology | Admitting: Interventional Radiology

## 2018-08-30 ENCOUNTER — Telehealth: Payer: Self-pay

## 2018-08-30 ENCOUNTER — Other Ambulatory Visit (HOSPITAL_COMMUNITY): Payer: Self-pay | Admitting: Interventional Radiology

## 2018-08-30 DIAGNOSIS — Z9889 Other specified postprocedural states: Secondary | ICD-10-CM

## 2018-08-30 DIAGNOSIS — J90 Pleural effusion, not elsewhere classified: Secondary | ICD-10-CM

## 2018-08-30 NOTE — Progress Notes (Signed)
Hematology/Oncology follow up  note [facility] [phone] [phone]   Patient Care Team: Virginia Crews, MD as PCP - General (Family Medicine) Clent Jacks, RN as Registered Nurse  REFERRING PROVIDER: Dr.Berchuck CHIEF COMPLAINTS/REASON FOR VISIT:  Evaluation of endometrial cancer.   HISTORY OF PRESENTING ILLNESS:  Karen Mann is a  57 y.o.  female with PMH listed below who was referred to me for evaluation of newly diagnosed endometrial cancer. Patient was recently referred to GYN Dr. Gilman Schmidt for abnormal uterine bleeding. 07/23/2018 ultrasound was performed showed large pelvic mass 20 x 13 x 22 cm with cystic and solid components.  Ultrasound also showed 4.9 cm fibroid and thickened endometrium 15 mm thickness 08/05/2018 MRI pelvis with and without contrast 20.6 cm complex cystic solid mass in the pelvis with numerous thickened enhancing septations, compatible with malignant ovarian neoplasm.  Neither ovary was discretely visualized.  Small volume of ascites.  Cross paratonia disease is not visualized but not excluded.  Left hydro-ureteronephrosis secondary to extrinsic compression. 5.3 intramural anterior uterine body fibroid.  #Endometrial biopsy was performed.  Pathology showed endometrioid adenocarcinoma consistent with FIGO grade 3.  8/12/19CA125 was elevated at 622 Patient was referred to see Dr. Fransisca Connors.  She was evaluated by Dr. Meredith Mody on 08/08/2018.  Staging CT was obtained. #Images independently reviewed by me.   Staging CT showed large cystic and a solid mass likely arising from the ovaries, measures 369 0 cc, suspicious for ovarian malignancy.  Moderate ascites noted.  Raising concern for malignant peritoneal spread.  No well-defined omental caking or solid tumor deposits along a situs., -Large right pleural effusion over half of right hemithorax volume. -Liver hypodensity 2.8 x 2.8 cm -Moderate left and mild right  hydronephrosis attributed to the ureteral compression due to mass. -Other chronic image findings.  MRI pelvis with and without contrast again showed 20.6 cm complex cystic/solid mass in the pelvis.  Compatible with malignant ovarian neoplasm.  Small volume of a situs.  Cross peritoneal disease is not visualized but cannot be excluded.  Left hydroureteronephrosis secondary to compression.  #Patient was scheduled to have a diagnostic and therapeutic thoracentesis this morning.  Cytology pending. She feels breathing is much better after the thoracentesis. Mild shortness of breath, worse with exertion.  Heart palpitation as well denies any chest pain, abdominal pain. Ongoing vaginal bleeding, she passed a big clot today. Feel extremely fatigued. She feels overwhelming and very nervous about the cancer diagnosis.  She has insomnia and she has tried melatonin which did not help with the sleep.  # Dr. Fransisca Connors recommends systemic therapy with carboplatin and paclitaxel 3 cycles, reassessment for surgical evaluation.per Dr.Berchuck, not candidate for Pembrolizumab and Levetinib trial.    INTERVAL HISTORY [name] is a 57 y.o. female who has above history reviewed by me today for follow-up visit for assessment after chemotherapy treatment for endometrial cancer, ovarian metastasis, ascites, pleural effusion and/or concomitant ovarian malignancies.   08/10/2018 thoracentesis  with 1.2 L fluid removed.  Cytology negative for malignancy. 08/16/2018 status post thoracentesis again and removed 2.2 L of mildly bloody fluid.  I discussed with Dr. Dicie Beam and she reviewed the slides there was no malignancy cells. Pleural fluid LDH 246, albumin 2.5, protein 4.2 Serum LDH 270.  Protein level 6.4 08/22/2018, another thoracentesis both liters of fluid.  Cytology negative.  #Chemotherapy, reports tolerating chemotherapy well.  Denies any nausea vomiting. She did have bone pain after receiving Neulasta.  She  takes Claritin  however still ha had severe bone pain.  Bone pain lasted 2 days.  Resolved now.  # fatigue: Continue to feel fatigued and tired.  #Vaginal bleeding, has stopped for 4 days.  Clinically improved. #Lower extremity swelling, stable at baseline.  Not improved with taking Lasix 20 mg as needed. #Shortness of breath, she feels breathing usually improves dramatically after thoracentesis however started to feel shortness of breath again.  She feels out of breath with exertion. #Palpitation has improved.  Review of Systems  Constitutional: Positive for malaise/fatigue. Negative for chills, fever and weight loss.  HENT: Negative for nosebleeds and sore throat.   Eyes: Negative for double vision, photophobia and redness.  Respiratory: Positive for shortness of breath. Negative for cough and wheezing.   Cardiovascular: Negative for chest pain, palpitations and orthopnea.  Gastrointestinal: Negative for abdominal pain, blood in stool, nausea and vomiting.       Abdominal distention  Genitourinary: Negative for dysuria.  Musculoskeletal: Negative for back pain, myalgias and neck pain.       Chronic back pain  Skin: Negative for itching and rash.  Neurological: Negative for dizziness, tingling and tremors.  Endo/Heme/Allergies: Negative for environmental allergies. Does not bruise/bleed easily.  Psychiatric/Behavioral: Negative for depression. The patient is not nervous/anxious.     MEDICAL HISTORY:  Past Medical History:  Diagnosis Date  . Allergy   . Anemia 07/20/2018  . Endometrial adenocarcinoma ([place]) 07/2018  . Fibroid uterus 07/24/2018   5cm/2 inch   . Ovarian mass 07/24/2018   22cm/10 inch  . Pelvic mass     SURGICAL HISTORY: Past Surgical History:  Procedure Laterality Date  . EYE SURGERY  2000   Lasik  . PORTA CATH INSERTION N/A 08/13/2018   Procedure: PORTA CATH INSERTION;  Surgeon: Algernon Huxley, MD;  Location: [place] [facility];  Service: Cardiovascular;   Laterality: N/A;    SOCIAL HISTORY: Social History   Socioeconomic History  . Marital status: Married    Spouse name: Not on file  . Number of children: Not on file  . Years of education: Not on file  . Highest education level: Not on file  Occupational History  . Not on file  Social Needs  . Financial resource strain: Not on file  . Food insecurity:    Worry: Not on file    Inability: Not on file  . Transportation needs:    Medical: Not on file    Non-medical: Not on file  Tobacco Use  . Smoking status: Never Smoker  . Smokeless tobacco: Never Used  Substance and Sexual Activity  . Alcohol use: No  . Drug use: No  . Sexual activity: Not Currently    Birth control/protection: Post-menopausal  Lifestyle  . Physical activity:    Days per week: Not on file    Minutes per session: Not on file  . Stress: Not on file  Relationships  . Social connections:    Talks on phone: Not on file    Gets together: Not on file    Attends religious service: Not on file    Active member of club or organization: Not on file    Attends meetings of clubs or organizations: Not on file    Relationship status: Not on file  . Intimate partner violence:    Fear of current or ex partner: Not on file    Emotionally abused: Not on file    Physically abused: Not on file    Forced sexual activity: Not  on file  Other Topics Concern  . Not on file  Social History Narrative  . Not on file    FAMILY HISTORY: Family History  Problem Relation Age of Onset  . Hypertension Father   . Ovarian cancer Mother   . Healthy Brother   . Ovarian cancer Maternal Aunt     ALLERGIES:  is allergic to penicillins.  MEDICATIONS:  Current Outpatient Medications  Medication Sig Dispense Refill  . ALPRAZolam (XANAX) 0.25 MG tablet Take 1 tablet (0.25 mg total) by mouth at bedtime as needed for anxiety or sleep. 20 tablet 0  . dexamethasone (DECADRON) 4 MG tablet Take 2 tablets (8 mg total) by mouth daily.  Start the day after chemotherapy for 2 days. 30 tablet 1  . furosemide (LASIX) 20 MG tablet Take 1 tablet (20 mg total) by mouth daily as needed for fluid. 30 tablet 0  . lidocaine-prilocaine (EMLA) cream Apply to affected area once 30 g 3  . Polyethylene Glycol 3350 (MIRALAX PO) Take by mouth daily.     . potassium chloride SA (K-DUR,KLOR-CON) 20 MEQ tablet Take 1 tablet (20 mEq total) by mouth daily as needed (take on the days you take Lasix.). 60 tablet 0  . prochlorperazine (COMPAZINE) 10 MG tablet Take 1 tablet (10 mg total) by mouth every 6 (six) hours as needed (Nausea or vomiting). 30 tablet 1  . rOPINIRole (REQUIP) 0.25 MG tablet Take 1 tablet (0.25 mg total) by mouth at bedtime. Take 1-3 hours before bedtime 30 tablet 3  . ondansetron (ZOFRAN) 8 MG tablet Take 1 tablet (8 mg total) by mouth 2 (two) times daily as needed for refractory nausea / vomiting. Start on day 3 after chemo. (Patient not taking: Reported on 08/29/2018) 30 tablet 1  . oxyCODONE (ROXICODONE) 5 MG immediate release tablet Take 1 tablet (5 mg total) by mouth every 6 (six) hours as needed for moderate pain, severe pain or breakthrough pain. 10 tablet 0   No current facility-administered medications for this visit.      PHYSICAL EXAMINATION: ECOG PERFORMANCE STATUS: 1 - Symptomatic but completely ambulatory Vitals:   08/29/18 1339  BP: 123/83  Pulse: (!) 101  Resp: 18  Temp: (!) 96.7 F (35.9 C)  SpO2: 94%   There were no vitals filed for this visit.  Physical Exam  Constitutional: She is oriented to person, place, and time. She appears well-developed and well-nourished. No distress.  HENT:  Head: Normocephalic and atraumatic.  Mouth/Throat: Oropharynx is clear and moist.  Eyes: Pupils are equal, round, and reactive to light. EOM are normal. No scleral icterus.  Pale conjunctivae  Neck: Normal range of motion. Neck supple.  Cardiovascular: Normal rate, regular rhythm and normal heart sounds.    Pulmonary/Chest: Effort normal. No respiratory distress. She has no wheezes.  Absent breathing sound on the  lower third portion of right lung  Abdominal: Soft. Bowel sounds are normal. She exhibits mass. She exhibits no distension. There is no tenderness.  Palpable lower abdominal/pelvis mass  Musculoskeletal: Normal range of motion. She exhibits edema. She exhibits no deformity.  Bilateral 2+ pitting edema  Neurological: She is alert and oriented to person, place, and time. No cranial nerve deficit. Coordination normal.  Skin: Skin is warm and dry. No rash noted. No erythema.  Psychiatric: She has a normal mood and affect. Her behavior is normal. Thought content normal.     LABORATORY DATA:  I have reviewed the data as listed Lab Results  Component Value  Date   WBC 14.4 (H) 08/29/2018   HGB 9.7 (L) 08/29/2018   HCT 31.3 (L) 08/29/2018   MCV 77.7 (L) 08/29/2018   PLT 229 08/29/2018   Recent Labs    08/17/18 0900 08/22/18 0834 08/29/18 1329  NA 137 138 136  K 4.4 3.7 4.0  CL 103 104 104  CO2 22 20* 22  GLUCOSE 108* 115* 103*  BUN 20 12 11   CREATININE 0.68 0.84 0.91  CALCIUM 8.5* 8.5* 8.7*  GFRNONAA >60 >60 >60  GFRAA >60 >60 >60  PROT 6.4* 6.3* 7.0  ALBUMIN 2.9* 2.9* 3.2*  AST 28 68* 17  ALT 13 53* 12  ALKPHOS 58 103 97  BILITOT 0.7 0.7 0.8   Iron/TIBC/Ferritin/ %Sat    Component Value Date/Time   IRON 11 (L) 07/17/2018 1124   TIBC 312 07/17/2018 1124   FERRITIN 60 07/17/2018 1124   IRONPCTSAT 4 (LL) 07/17/2018 1124     RADIOGRAPHIC STUDIES: I have personally reviewed the radiological images as listed and agreed with the findings in the report. 08/24/1999 nineteen 2D echo showed LVEF 55 to 60%.  ASSESSMENT & PLAN:  1. Symptomatic anemia   2. Pleural effusion   3. Endometrial adenocarcinoma ([place])   4. Abnormal uterine bleeding (AUB)   5. Iron deficiency anemia due to chronic blood loss   6. Recurrent pleural effusion on right   7. Leg swelling   8.  Hydronephrosis, unspecified hydronephrosis type   9. Insomnia due to anxiety and fear    #  FIGO grade 3 endometrial adenocarcinoma, clinically with extrauterine disease including intra-abdominal metastasis, Ascites, pleural effusion, and/or concomitant ovarian malignancies.  Status post 1 dose of carboplatin and Taxol treatment. Overall tolerates chemotherapy well.  #Severe iron deficiency due to chronic blood loss.  Status post 3 treatment IV iron Venofer 200 mg, status post 1 PRBC transfusion Lab was reviewed and discussed with patient.  Hemoglobin improved.  Proceed with fourth treatment of IV Venofer today.    #Symptomatic anemia, status post 1 PRBC transfusion this week.  Hemoglobin improved to 9.7.  #Recurrent pleural effusion, cytology negative x 3.  Fluid study meets lights criteria for exudate, LDH more than to start upper limit of mild serum LDH.  Flow cytometry negative.  2D echo revealed normal systolic function. Clinically consistent with metastasis from GYN adenocarcinoma. Status post thoracentesis x3, removing large amount of fluid.  On physical examination, right lung diminished breath sounds up to lower one third, indicating reaccumulation of pleural effusion on the right side.  We will proceed with another ultrasound guided thoracentesis.   Hopefully chemotherapy can slow down fluid reaccumulation, however if she continue has quick reaccumulation which needs weekly thoracentesis, will consider pleural catheter placement.   # Thrombocytosis: Resolved. # Hydronephrosis, renal function remains stable.  Continue close monitor. # Anxiety, insomnia, continues Xanax 0.25 mg not helping with her sleep.  She takes Benadryl at night which is helpful.  Stable. #Patient's case was discussed on GYN tumor conference.  Dr. Theora Gianotti recommends NexGen sequence molecular testing.  And also check for PDL 1.  #Chronic lower extremity swelling, on Lasix as needed.  Not helpful with her symptoms.   I suspect her chronic lower extremity swelling likely secondary to anasarca or decreased venous return due to pelvic mass compression on IVC.  Continue to monitor.  #Severe bone pain after Neulasta injection.  Not improved with Claritin.  I prescribe oxycodone 5 mg every 6 hours as needed for intractable pain secondary to Neulasta.  Discussed the potential side effects and treatment/ prevention  of constipation,  respiratory depression,  and mental status changes.  Orders Placed This Encounter  Procedures  . US THORACENTESIS ASP PLEURAL SPACE W/IMG GUIDE    Standing Status:   Standing    Number of Occurrences:   5    Standing Expiration Date:   02/27/2019    Order Specific Question:   Are labs required for specimen collection?    Answer:   No    Order Specific Question:   Reason for Exam (SYMPTOM  OR DIAGNOSIS REQUIRED)    Answer:   SOB recurrent pleural effusion    Order Specific Question:   Preferred imaging location?    Answer:   [facility]  . Iron and TIBC    Standing Status:   Future    Standing Expiration Date:   08/30/2019  . Ferritin    Standing Status:   Future    Standing Expiration Date:   08/30/2019  . CA 125    Standing Status:   Standing    Number of Occurrences:   20    Standing Expiration Date:   08/31/2019   We spent sufficient time to discuss many aspect of care, questions were answered to patient's satisfaction. The patient knows to call the clinic with any problems questions or concerns.  Return of visit: 1 week for assessment prior to cycle 2 chemotherapy treatments.   Earlie Server, MD, PhD Hematology Oncology [facility] Pager- [phone] 08/30/2018

## 2018-08-30 NOTE — Telephone Encounter (Signed)
Test request sent to [facility] for processing. Oncology Nurse Navigator Documentation  Navigator Location: [facility] (08/30/18 1500)   )Navigator Encounter Type: Letter/Fax/Email (08/30/18 1500)                                                    Time Spent with Patient: 15 (08/30/18 1500)

## 2018-08-30 NOTE — Procedures (Signed)
Pre procedural Dx: Symptomatic Pleural effusion Post procedural Dx: Same  Successful US guided right sided thoracentesis yielding 1.5  L of serous pleural fluid.   Samples sent to lab for analysis.  EBL: None  Complications: None immediate.  Jay Valori Hollenkamp, MD Pager #: [phone]   

## 2018-08-31 ENCOUNTER — Ambulatory Visit: Payer: 59 | Admitting: Obstetrics and Gynecology

## 2018-09-03 DIAGNOSIS — C541 Malignant neoplasm of endometrium: Secondary | ICD-10-CM | POA: Diagnosis not present

## 2018-09-05 ENCOUNTER — Telehealth: Payer: Self-pay | Admitting: Genetic Counselor

## 2018-09-05 ENCOUNTER — Encounter: Payer: Self-pay | Admitting: Oncology

## 2018-09-05 ENCOUNTER — Inpatient Hospital Stay: Payer: 59

## 2018-09-05 ENCOUNTER — Inpatient Hospital Stay (HOSPITAL_BASED_OUTPATIENT_CLINIC_OR_DEPARTMENT_OTHER): Payer: 59 | Admitting: Oncology

## 2018-09-05 ENCOUNTER — Other Ambulatory Visit: Payer: Self-pay

## 2018-09-05 VITALS — BP 111/78 | HR 96 | Resp 20

## 2018-09-05 VITALS — BP 103/71 | HR 97 | Temp 95.7°F | Resp 18 | Wt 220.2 lb

## 2018-09-05 DIAGNOSIS — C541 Malignant neoplasm of endometrium: Secondary | ICD-10-CM

## 2018-09-05 DIAGNOSIS — Z5111 Encounter for antineoplastic chemotherapy: Secondary | ICD-10-CM

## 2018-09-05 DIAGNOSIS — M7989 Other specified soft tissue disorders: Secondary | ICD-10-CM

## 2018-09-05 DIAGNOSIS — D500 Iron deficiency anemia secondary to blood loss (chronic): Secondary | ICD-10-CM

## 2018-09-05 DIAGNOSIS — Z79899 Other long term (current) drug therapy: Secondary | ICD-10-CM

## 2018-09-05 DIAGNOSIS — D649 Anemia, unspecified: Secondary | ICD-10-CM

## 2018-09-05 DIAGNOSIS — J90 Pleural effusion, not elsewhere classified: Secondary | ICD-10-CM

## 2018-09-05 DIAGNOSIS — F419 Anxiety disorder, unspecified: Secondary | ICD-10-CM

## 2018-09-05 DIAGNOSIS — D259 Leiomyoma of uterus, unspecified: Secondary | ICD-10-CM

## 2018-09-05 DIAGNOSIS — N1330 Unspecified hydronephrosis: Secondary | ICD-10-CM

## 2018-09-05 DIAGNOSIS — Z8041 Family history of malignant neoplasm of ovary: Secondary | ICD-10-CM

## 2018-09-05 DIAGNOSIS — R5383 Other fatigue: Secondary | ICD-10-CM

## 2018-09-05 DIAGNOSIS — T8090XA Unspecified complication following infusion and therapeutic injection, initial encounter: Secondary | ICD-10-CM

## 2018-09-05 DIAGNOSIS — G4700 Insomnia, unspecified: Secondary | ICD-10-CM

## 2018-09-05 DIAGNOSIS — R188 Other ascites: Secondary | ICD-10-CM

## 2018-09-05 DIAGNOSIS — N939 Abnormal uterine and vaginal bleeding, unspecified: Secondary | ICD-10-CM

## 2018-09-05 LAB — CBC WITH DIFFERENTIAL/PLATELET
BASOS ABS: 0.2 10*3/uL — AB (ref 0–0.1)
Basophils Relative: 2 %
Eosinophils Absolute: 0.1 10*3/uL (ref 0–0.7)
Eosinophils Relative: 1 %
HEMATOCRIT: 30.4 % — AB (ref 35.0–47.0)
Hemoglobin: 9.7 g/dL — ABNORMAL LOW (ref 12.0–16.0)
LYMPHS PCT: 13 %
Lymphs Abs: 1.1 10*3/uL (ref 1.0–3.6)
MCH: 25 pg — ABNORMAL LOW (ref 26.0–34.0)
MCHC: 31.9 g/dL — ABNORMAL LOW (ref 32.0–36.0)
MCV: 78.5 fL — ABNORMAL LOW (ref 80.0–100.0)
MONO ABS: 0.7 10*3/uL (ref 0.2–0.9)
MONOS PCT: 8 %
NEUTROS ABS: 6.4 10*3/uL (ref 1.4–6.5)
Neutrophils Relative %: 76 %
Platelets: 236 10*3/uL (ref 150–440)
RBC: 3.87 MIL/uL (ref 3.80–5.20)
RDW: 26.3 % — ABNORMAL HIGH (ref 11.5–14.5)
WBC: 8.4 10*3/uL (ref 3.6–11.0)

## 2018-09-05 LAB — IRON AND TIBC
IRON: 25 ug/dL — AB (ref 28–170)
Saturation Ratios: 10 % — ABNORMAL LOW (ref 10.4–31.8)
TIBC: 245 ug/dL — AB (ref 250–450)
UIBC: 220 ug/dL

## 2018-09-05 LAB — COMPREHENSIVE METABOLIC PANEL
ALBUMIN: 3.1 g/dL — AB (ref 3.5–5.0)
ALT: 7 U/L (ref 0–44)
ANION GAP: 9 (ref 5–15)
AST: 14 U/L — ABNORMAL LOW (ref 15–41)
Alkaline Phosphatase: 75 U/L (ref 38–126)
BUN: 11 mg/dL (ref 6–20)
CO2: 23 mmol/L (ref 22–32)
Calcium: 8.7 mg/dL — ABNORMAL LOW (ref 8.9–10.3)
Chloride: 105 mmol/L (ref 98–111)
Creatinine, Ser: 0.76 mg/dL (ref 0.44–1.00)
GFR calc Af Amer: >60 mL/min (ref >60)
GFR calc non Af Amer: >60 mL/min (ref >60)
GLUCOSE: 98 mg/dL (ref 70–99)
POTASSIUM: 3.9 mmol/L (ref 3.5–5.1)
SODIUM: 137 mmol/L (ref 135–145)
Total Bilirubin: 0.7 mg/dL (ref 0.3–1.2)
Total Protein: 6.7 g/dL (ref 6.5–8.1)

## 2018-09-05 LAB — SAMPLE TO BLOOD BANK

## 2018-09-05 LAB — FERRITIN: Ferritin: 323 ng/mL — ABNORMAL HIGH (ref 11–307)

## 2018-09-05 MED ORDER — SODIUM CHLORIDE 0.9 % IV SOLN
Freq: Once | INTRAVENOUS | Status: AC
  Administered 2018-09-05: 10:00:00 via INTRAVENOUS
  Filled 2018-09-05: qty 5

## 2018-09-05 MED ORDER — HEPARIN SOD (PORK) LOCK FLUSH 100 UNIT/ML IV SOLN
500.0000 [IU] | Freq: Once | INTRAVENOUS | Status: AC
  Administered 2018-09-05: 500 [IU] via INTRAVENOUS
  Filled 2018-09-05: qty 5

## 2018-09-05 MED ORDER — SODIUM CHLORIDE 0.9 % IV SOLN: 200.0000 mg | INTRAVENOUS | Status: DC

## 2018-09-05 MED ORDER — SODIUM CHLORIDE 0.9 % IV SOLN
Freq: Once | INTRAVENOUS | Status: AC
  Administered 2018-09-05: 10:00:00 via INTRAVENOUS
  Filled 2018-09-05: qty 250

## 2018-09-05 MED ORDER — PALONOSETRON HCL INJECTION 0.25 MG/5ML
0.2500 mg | Freq: Once | INTRAVENOUS | Status: AC
  Administered 2018-09-05: 0.25 mg via INTRAVENOUS
  Filled 2018-09-05: qty 5

## 2018-09-05 MED ORDER — FAMOTIDINE IN NACL 20-0.9 MG/50ML-% IV SOLN
20.0000 mg | Freq: Once | INTRAVENOUS | Status: AC
  Administered 2018-09-05: 20 mg via INTRAVENOUS
  Filled 2018-09-05: qty 50

## 2018-09-05 MED ORDER — DEXAMETHASONE 4 MG PO TABS: 8.0000 mg | ORAL_TABLET | ORAL | 1 refills | Status: DC

## 2018-09-05 MED ORDER — SODIUM CHLORIDE 0.9 % IV SOLN
Freq: Once | INTRAVENOUS | Status: AC | PRN
  Administered 2018-09-05: 11:00:00 via INTRAVENOUS
  Filled 2018-09-05: qty 250

## 2018-09-05 MED ORDER — SODIUM CHLORIDE 0.9 % IV SOLN
175.0000 mg/m2 | Freq: Once | INTRAVENOUS | Status: AC
  Administered 2018-09-05: 402 mg via INTRAVENOUS
  Filled 2018-09-05: qty 67

## 2018-09-05 MED ORDER — METHYLPREDNISOLONE SODIUM SUCC 125 MG IJ SOLR
125.0000 mg | Freq: Once | INTRAMUSCULAR | Status: AC | PRN
  Administered 2018-09-05: 125 mg via INTRAVENOUS

## 2018-09-05 MED ORDER — DIPHENHYDRAMINE HCL 50 MG/ML IJ SOLN
50.0000 mg | Freq: Once | INTRAMUSCULAR | Status: AC
  Administered 2018-09-05: 50 mg via INTRAVENOUS
  Filled 2018-09-05: qty 1

## 2018-09-05 MED ORDER — IRON SUCROSE 20 MG/ML IV SOLN
200.0000 mg | Freq: Once | INTRAVENOUS | Status: AC
  Administered 2018-09-05: 200 mg via INTRAVENOUS
  Filled 2018-09-05: qty 10

## 2018-09-05 MED ORDER — DIPHENHYDRAMINE HCL 50 MG/ML IJ SOLN
25.0000 mg | Freq: Once | INTRAMUSCULAR | Status: AC | PRN
  Administered 2018-09-05: 25 mg via INTRAVENOUS

## 2018-09-05 MED ORDER — SODIUM CHLORIDE 0.9 % IV SOLN
900.0000 mg | Freq: Once | INTRAVENOUS | Status: AC
  Administered 2018-09-05: 900 mg via INTRAVENOUS
  Filled 2018-09-05: qty 90

## 2018-09-05 MED ORDER — SODIUM CHLORIDE 0.9% FLUSH
10.0000 mL | INTRAVENOUS | Status: DC | PRN
  Administered 2018-09-05: 10 mL via INTRAVENOUS
  Filled 2018-09-05: qty 10

## 2018-09-05 NOTE — Progress Notes (Signed)
Taxol started at 1106. 1116 patient started feeling tightness in the chest and throat. Taxol stopped, Solu-medrol and Benadryl given. NS started at 955ml/hr. Beckey Rutter NP at chairside. VS stable pulse ox 89 on room air, 2L O2 via [place] applied sats improved to 96. Dr Tasia Catchings chairside at 1130. 1139- patient states symptoms resolved. NS bolus still infusing.  1154-Per Dr Tasia Catchings restart patient at slower rate and monitor. If symptoms recur stop Taxol infusion. VS stable at time of restart. Will continue to monitor. Patient tolerated remainder of taxol without difficulty.

## 2018-09-05 NOTE — Progress Notes (Signed)
Hematology/Oncology follow up  note [facility] [phone] [phone]) [phone]   Patient Care Team: Virginia Crews, MD as PCP - General (Family Medicine) Clent Jacks, RN as Registered Nurse  REFERRING PROVIDER: Dr.Berchuck CHIEF COMPLAINTS/REASON FOR VISIT:  Evaluation of endometrial cancer.   HISTORY OF PRESENTING ILLNESS:  Karen Mann is a  57 y.o.  female with PMH listed below who was referred to me for evaluation of newly diagnosed endometrial cancer. Patient was recently referred to GYN Dr. Gilman Schmidt for abnormal uterine bleeding. 07/23/2018 ultrasound was performed showed large pelvic mass 20 x 13 x 22 cm with cystic and solid components.  Ultrasound also showed 4.9 cm fibroid and thickened endometrium 15 mm thickness 08/05/2018 MRI pelvis with and without contrast 20.6 cm complex cystic solid mass in the pelvis with numerous thickened enhancing septations, compatible with malignant ovarian neoplasm.  Neither ovary was discretely visualized.  Small volume of ascites.  Cross paratonia disease is not visualized but not excluded.  Left hydro-ureteronephrosis secondary to extrinsic compression. 5.3 intramural anterior uterine body fibroid.  #Endometrial biopsy was performed.  Pathology showed endometrioid adenocarcinoma consistent with FIGO grade 3.  8/12/19CA125 was elevated at 622 Patient was referred to see Dr. Fransisca Connors.  She was evaluated by Dr. Meredith Mody on 08/08/2018.  Staging CT was obtained. #Images independently reviewed by me.   Staging CT showed large cystic and a solid mass likely arising from the ovaries, measures 369 0 cc, suspicious for ovarian malignancy.  Moderate ascites noted.  Raising concern for malignant peritoneal spread.  No well-defined omental caking or solid tumor deposits along a situs., -Large right pleural effusion over half of right hemithorax volume. -Liver hypodensity 2.8 x 2.8 cm -Moderate left and mild right  hydronephrosis attributed to the ureteral compression due to mass. -Other chronic image findings.  MRI pelvis with and without contrast again showed 20.6 cm complex cystic/solid mass in the pelvis.  Compatible with malignant ovarian neoplasm.  Small volume of a situs.  Cross peritoneal disease is not visualized but cannot be excluded.  Left hydroureteronephrosis secondary to compression.  #Patient was scheduled to have a diagnostic and therapeutic thoracentesis this morning.  Cytology pending. She feels breathing is much better after the thoracentesis. Mild shortness of breath, worse with exertion.  Heart palpitation as well denies any chest pain, abdominal pain. Ongoing vaginal bleeding, she passed a big clot today. Feel extremely fatigued. She feels overwhelming and very nervous about the cancer diagnosis.  She has insomnia and she has tried melatonin which did not help with the sleep.  # Dr. Fransisca Connors recommends systemic therapy with carboplatin and paclitaxel 3 cycles, reassessment for surgical evaluation.per Dr.Berchuck, not candidate for Pembrolizumab and Levetinib trial.   # 08/10/2018 thoracentesis  with 1.2 L fluid removed.  Cytology negative for malignancy. 08/16/2018 status post thoracentesis again and removed 2.2 L of mildly bloody fluid.  I discussed with Dr. Dicie Beam and she reviewed the slides there was no malignancy cells. Pleural fluid LDH 246, albumin 2.5, protein 4.2 Serum LDH 270.  Protein level 6.4 08/22/2018, another thoracentesis both liters of fluid.  Cytology negative.  Patient's case was discussed on GYN tumor conference.  Dr. Theora Gianotti recommends NexGen sequence molecular testing.  And also check for PDL 1.    INTERVAL HISTORY Karen Mann is a 57 y.o. female who has above history reviewed by me today follow-up for assessment prior to neoadjuvant chemotherapy for endometrial cancer, peritoneal metastasis, ascites, pleural effusion and/or concomitant ovarian  malignancies.  #Chemotherapy, reports tolerating cycle 1 carboplatin and Taxol chemotherapy well.  Denies any nausea vomiting.   She had bone pain after receiving Neulasta.  Takes Claritin however still had moderate to severe bone pain for 2 days.  Resolved now.  She has prescription of narcotics to be used if she experience bone pain this cycle.  #Shortness of breath, she reports breathing has improved.  Last week, she is status post another thoracentesis on 08/30/2018 And had 1.5 L removed.  Today she feels that breathing is at baseline.  Does not feel shortness of breath.  # fatigue: Feeling better.  Energy level has improved. #Vaginal bleeding, has stopped for the past week.  Clinically improved.  #Lower extremity swelling, stable at baseline.  She is not taking Lasix as instructed as Lasix has not been helpful.  Review of Systems  Constitutional: Positive for malaise/fatigue. Negative for chills, fever and weight loss.  HENT: Negative for nosebleeds and sore throat.   Eyes: Negative for double vision, photophobia and redness.  Respiratory: Positive for shortness of breath. Negative for cough and wheezing.   Cardiovascular: Negative for chest pain, palpitations and orthopnea.  Gastrointestinal: Negative for abdominal pain, blood in stool, nausea and vomiting.       Abdominal distention  Genitourinary: Negative for dysuria.  Musculoskeletal: Negative for back pain, myalgias and neck pain.       Chronic back pain  Skin: Negative for itching and rash.  Neurological: Negative for dizziness, tingling and tremors.  Endo/Heme/Allergies: Negative for environmental allergies. Does not bruise/bleed easily.  Psychiatric/Behavioral: Negative for depression. The patient is not nervous/anxious.     MEDICAL HISTORY:  Past Medical History:  Diagnosis Date  . Allergy   . Anemia 07/20/2018  . Endometrial adenocarcinoma ([place]) 07/2018  . Fibroid uterus 07/24/2018   5cm/2 inch   . Ovarian mass  07/24/2018   22cm/10 inch  . Pelvic mass     SURGICAL HISTORY: Past Surgical History:  Procedure Laterality Date  . EYE SURGERY  2000   Lasik  . PORTA CATH INSERTION N/A 08/13/2018   Procedure: PORTA CATH INSERTION;  Surgeon: Algernon Huxley, MD;  Location: [facility];  Service: Cardiovascular;  Laterality: N/A;    SOCIAL HISTORY: Social History   Socioeconomic History  . Marital status: Married    Spouse name: Not on file  . Number of children: Not on file  . Years of education: Not on file  . Highest education level: Not on file  Occupational History  . Not on file  Social Needs  . Financial resource strain: Not on file  . Food insecurity:    Worry: Not on file    Inability: Not on file  . Transportation needs:    Medical: Not on file    Non-medical: Not on file  Tobacco Use  . Smoking status: Never Smoker  . Smokeless tobacco: Never Used  Substance and Sexual Activity  . Alcohol use: No  . Drug use: No  . Sexual activity: Not Currently    Birth control/protection: Post-menopausal  Lifestyle  . Physical activity:    Days per week: Not on file    Minutes per session: Not on file  . Stress: Not on file  Relationships  . Social connections:    Talks on phone: Not on file    Gets together: Not on file    Attends religious service: Not on file    Active member of club or organization: Not on file  Attends meetings of clubs or organizations: Not on file    Relationship status: Not on file  . Intimate partner violence:    Fear of current or ex partner: Not on file    Emotionally abused: Not on file    Physically abused: Not on file    Forced sexual activity: Not on file  Other Topics Concern  . Not on file  Social History Narrative  . Not on file    FAMILY HISTORY: Family History  Problem Relation Age of Onset  . Hypertension Father   . Ovarian cancer Mother   . Healthy Brother   . Ovarian cancer Maternal Aunt     ALLERGIES:  is allergic to  penicillins.  MEDICATIONS:  Current Outpatient Medications  Medication Sig Dispense Refill  . ALPRAZolam (XANAX) 0.25 MG tablet Take 1 tablet (0.25 mg total) by mouth at bedtime as needed for anxiety or sleep. 20 tablet 0  . dexamethasone (DECADRON) 4 MG tablet Take 2 tablets (8 mg total) by mouth daily. Start the day after chemotherapy for 2 days. 30 tablet 1  . lidocaine-prilocaine (EMLA) cream Apply to affected area once 30 g 3  . ondansetron (ZOFRAN) 8 MG tablet Take 1 tablet (8 mg total) by mouth 2 (two) times daily as needed for refractory nausea / vomiting. Start on day 3 after chemo. 30 tablet 1  . oxyCODONE (ROXICODONE) 5 MG immediate release tablet Take 1 tablet (5 mg total) by mouth every 6 (six) hours as needed for moderate pain, severe pain or breakthrough pain. 10 tablet 0  . Polyethylene Glycol 3350 (MIRALAX PO) Take by mouth daily.     . prochlorperazine (COMPAZINE) 10 MG tablet Take 1 tablet (10 mg total) by mouth every 6 (six) hours as needed (Nausea or vomiting). 30 tablet 1  . rOPINIRole (REQUIP) 0.25 MG tablet Take 1 tablet (0.25 mg total) by mouth at bedtime. Take 1-3 hours before bedtime 30 tablet 3  . furosemide (LASIX) 20 MG tablet Take 1 tablet (20 mg total) by mouth daily as needed for fluid. (Patient not taking: Reported on 09/05/2018) 30 tablet 0  . potassium chloride SA (K-DUR,KLOR-CON) 20 MEQ tablet Take 1 tablet (20 mEq total) by mouth daily as needed (take on the days you take Lasix.). (Patient not taking: Reported on 09/05/2018) 60 tablet 0   No current facility-administered medications for this visit.    Facility-Administered Medications Ordered in Other Visits  Medication Dose Route Frequency Provider Last Rate Last Dose  . CARBOplatin (PARAPLATIN) 900 mg in sodium chloride 0.9 % 500 mL chemo infusion  900 mg Intravenous Once Earlie Server, MD      . heparin lock flush 100 unit/mL  500 Units Intravenous Once Earlie Server, MD      . sodium chloride flush (NS) 0.9 %  injection 10 mL  10 mL Intravenous PRN Earlie Server, MD   10 mL at 09/05/18 7564     PHYSICAL EXAMINATION: ECOG PERFORMANCE STATUS: 1 - Symptomatic but completely ambulatory Vitals:   09/05/18 0852  BP: 103/71  Pulse: 97  Resp: 18  Temp: (!) 95.7 F (35.4 C)   Filed Weights   09/05/18 0852  Weight: 220 lb 3 oz (99.9 kg)    Physical Exam  Constitutional: She is oriented to person, place, and time. She appears well-developed and well-nourished. No distress.  HENT:  Head: Normocephalic and atraumatic.  Right Ear: External ear normal.  Left Ear: External ear normal.  Mouth/Throat: Oropharynx is clear and  moist.  Eyes: Pupils are equal, round, and reactive to light. EOM are normal. No scleral icterus.  Pale conjunctivae  Neck: Normal range of motion. Neck supple.  Cardiovascular: Normal rate, regular rhythm and normal heart sounds.  Pulmonary/Chest: Effort normal. No respiratory distress. She has no wheezes.  Absent breathing sound on the  lower third portion of right lung  Abdominal: Soft. Bowel sounds are normal. She exhibits mass. She exhibits no distension. There is no tenderness.  Palpable lower abdominal/pelvis mass  Musculoskeletal: Normal range of motion. She exhibits edema. She exhibits no deformity.  Bilateral 2+ pitting edema  Neurological: She is alert and oriented to person, place, and time. No cranial nerve deficit. Coordination normal.  Skin: Skin is warm and dry. No rash noted. No erythema.  Psychiatric: She has a normal mood and affect. Her behavior is normal. Thought content normal.     LABORATORY DATA:  I have reviewed the data as listed Lab Results  Component Value Date   WBC 8.4 09/05/2018   HGB 9.7 (L) 09/05/2018   HCT 30.4 (L) 09/05/2018   MCV 78.5 (L) 09/05/2018   PLT 236 09/05/2018   Recent Labs    08/22/18 0834 08/29/18 1329 09/05/18 0819  NA 138 136 137  K 3.7 4.0 3.9  CL 104 104 105  CO2 20* 22 23  GLUCOSE 115* 103* 98  BUN 12 11 11    CREATININE 0.84 0.91 0.76  CALCIUM 8.5* 8.7* 8.7*  GFRNONAA >60 >60 >60  GFRAA >60 >60 >60  PROT 6.3* 7.0 6.7  ALBUMIN 2.9* 3.2* 3.1*  AST 68* 17 14*  ALT 53* 12 7  ALKPHOS 103 97 75  BILITOT 0.7 0.8 0.7   Iron/TIBC/Ferritin/ %Sat    Component Value Date/Time   IRON 25 (L) 09/05/2018 0819   IRON 11 (L) 07/17/2018 1124   TIBC 245 (L) 09/05/2018 0819   TIBC 312 07/17/2018 1124   FERRITIN 323 (H) 09/05/2018 0819   FERRITIN 60 07/17/2018 1124   IRONPCTSAT 10 (L) 09/05/2018 0819   IRONPCTSAT 4 (LL) 07/17/2018 1124     RADIOGRAPHIC STUDIES: I have personally reviewed the radiological images as listed and agreed with the findings in the report. 08/24/1999 nineteen 2D echo showed LVEF 55 to 60%. 08/30/2018 CXR Interval reduction/resolution of right-sided pleural effusion post thoracentesis. No pneumothorax. ASSESSMENT & PLAN:  1. Iron deficiency anemia due to chronic blood loss   2. Endometrial adenocarcinoma ([place])   3. Abnormal uterine bleeding (AUB)   4. Hydronephrosis, unspecified hydronephrosis type   5. Recurrent pleural effusion on right   6. Encounter for antineoplastic chemotherapy   7. Other ascites   8. Infusion reaction, initial encounter    #  FIGO grade 3 endometrial adenocarcinoma, clinically with extrauterine disease including intra-abdominal metastasis, Ascites, pleural effusion, and/or concomitant ovarian malignancies.   Status post 1 cycle of carboplatin and Taxol treatment.  Overall tolerates chemotherapy well. CA125 pending.  Labs reviewed and discussed with patient.  Counts acceptable to proceed with today's carboplatin and Taxol treatment.  # Infusion reaction: I was called to see Ms.Alderete at infusion center.  10 minutes into receiving Taxol, patient reports feeling chest tightness, shortness of breath and oxygen saturation dropped to 89%.  Patient was given Solu-Medrol 125 mg x 1, Benadryl 50 mg. Symptoms resolved. She probably had a Taxol infusion  reaction.  After treatments, symptoms are improved.  I have talked to RN to restart Taxol at a lower dose for re-challenge.  Advise patient to take  dexamethasone 8mg  x 2 days prior to coming for her next cycle of treatments.  #Severe iron deficiency due to chronic blood loss, iron panel reviewed, improved however still low saturation ratio at 10%. We will proceed 1 more dose of Venofer 200 mg x 1 today.    #Anemia due to iron deficiency and chemotherapy.  Hemoglobin stable at 9.7.  Hold additional PRBC transfusion today.  #Recurrent pleural effusion, cytology negative x 3.  Fluid study meets lights criteria for exudate, LDH more than to start upper limit of mild serum LDH.  Flow cytometry negative.  2D echo revealed normal systolic function. Clinically consistent with metastasis from GYN adenocarcinoma. Status post thoracentesis x4.  Physical examination showed some diminished breast sounds at the right base. She is asymptomatic.  Advised patient to call if she started to feel more shortness of breath.  Hold thoracentesis for now.  # Hydronephrosis, kidney function remained stable.  Continue monitor. # Anxiety, insomnia, continues Xanax 0.25 mg not helping with her sleep.  She takes Benadryl at night which is helpful.  Stable. ##Chronic lower extremity swelling, on Lasix as needed.  Not helpful with her symptoms.  I suspect her chronic lower extremity swelling likely secondary to anasarca or decreased venous return due to pelvic mass compression on IVC.  Continue to monitor.  #Severe bone pain after Neulasta injection.  Not improved with Claritin.  She may try oxycodone 5 mg every 6 hours as needed for intractable pain secondary to Neulasta.  Discussed the potential side effects and treatment/ prevention  of constipation,  respiratory depression,  and mental status changes.  # Family history ovarian cancer and personal history of endometrial CA.  Refer to Dietitian.  We spent  sufficient time to discuss many aspect of care, questions were answered to patient's satisfaction. The patient knows to call the clinic with any problems questions or concerns.  Return of visit: 3 weeks for assessment prior to Cycle 3 treatment.  Total face to face encounter time for this patient visit was 40 min. >50% of the time was  spent in counseling and coordination of care.  Earlie Server, MD, PhD Hematology Oncology [facility] at [facility] Pager- [phone] 09/05/2018

## 2018-09-05 NOTE — Progress Notes (Signed)
Patient here today for a follow up and chemotherapy.

## 2018-09-05 NOTE — Telephone Encounter (Signed)
Dr. Tasia Catchings is referring Ms. Stockman for genetic counseling due to a personal and family history of cancer. I left her a message to call and schedule this telegenetics visit to be done by phone at her convenience.   Steele Berg, [place], [place] Genetic Counselor Phone: [phone]

## 2018-09-06 ENCOUNTER — Telehealth: Payer: Self-pay | Admitting: *Deleted

## 2018-09-06 LAB — CA 125: Cancer Antigen (CA) 125: 875 U/mL — ABNORMAL HIGH (ref 0.0–38.1)

## 2018-09-06 NOTE — Telephone Encounter (Signed)
Called patient and discussed dexamethasone directions

## 2018-09-06 NOTE — Telephone Encounter (Signed)
Patient would like you to call her regarding how she should be taking her Decadron. Please call her.

## 2018-09-07 ENCOUNTER — Encounter: Payer: Self-pay | Admitting: Nurse Practitioner

## 2018-09-07 ENCOUNTER — Telehealth: Payer: Self-pay | Admitting: Oncology

## 2018-09-07 ENCOUNTER — Other Ambulatory Visit: Payer: Self-pay | Admitting: Oncology

## 2018-09-07 ENCOUNTER — Other Ambulatory Visit: Payer: Self-pay

## 2018-09-07 ENCOUNTER — Inpatient Hospital Stay: Payer: 59

## 2018-09-07 ENCOUNTER — Inpatient Hospital Stay (HOSPITAL_BASED_OUTPATIENT_CLINIC_OR_DEPARTMENT_OTHER): Payer: 59 | Admitting: Nurse Practitioner

## 2018-09-07 VITALS — BP 125/88 | HR 80 | Temp 97.6°F | Resp 20 | Ht 69.0 in | Wt 224.0 lb

## 2018-09-07 DIAGNOSIS — D259 Leiomyoma of uterus, unspecified: Secondary | ICD-10-CM

## 2018-09-07 DIAGNOSIS — M7989 Other specified soft tissue disorders: Secondary | ICD-10-CM

## 2018-09-07 DIAGNOSIS — Z79899 Other long term (current) drug therapy: Secondary | ICD-10-CM

## 2018-09-07 DIAGNOSIS — D500 Iron deficiency anemia secondary to blood loss (chronic): Secondary | ICD-10-CM

## 2018-09-07 DIAGNOSIS — R188 Other ascites: Secondary | ICD-10-CM

## 2018-09-07 DIAGNOSIS — C541 Malignant neoplasm of endometrium: Secondary | ICD-10-CM

## 2018-09-07 DIAGNOSIS — G620 Drug-induced polyneuropathy: Secondary | ICD-10-CM | POA: Insufficient documentation

## 2018-09-07 DIAGNOSIS — J90 Pleural effusion, not elsewhere classified: Secondary | ICD-10-CM

## 2018-09-07 DIAGNOSIS — N1330 Unspecified hydronephrosis: Secondary | ICD-10-CM

## 2018-09-07 DIAGNOSIS — T451X5A Adverse effect of antineoplastic and immunosuppressive drugs, initial encounter: Secondary | ICD-10-CM

## 2018-09-07 DIAGNOSIS — F418 Other specified anxiety disorders: Secondary | ICD-10-CM

## 2018-09-07 DIAGNOSIS — R600 Localized edema: Secondary | ICD-10-CM

## 2018-09-07 DIAGNOSIS — Z8041 Family history of malignant neoplasm of ovary: Secondary | ICD-10-CM

## 2018-09-07 DIAGNOSIS — F419 Anxiety disorder, unspecified: Secondary | ICD-10-CM

## 2018-09-07 DIAGNOSIS — G4700 Insomnia, unspecified: Secondary | ICD-10-CM

## 2018-09-07 DIAGNOSIS — C801 Malignant (primary) neoplasm, unspecified: Secondary | ICD-10-CM

## 2018-09-07 DIAGNOSIS — R5383 Other fatigue: Secondary | ICD-10-CM

## 2018-09-07 MED ORDER — GABAPENTIN 300 MG PO CAPS: 300.0000 mg | ORAL_CAPSULE | Freq: Every day | ORAL | 0 refills | Status: DC

## 2018-09-07 MED ORDER — PEGFILGRASTIM INJECTION 6 MG/0.6ML ~~LOC~~
6.0000 mg | PREFILLED_SYRINGE | Freq: Once | SUBCUTANEOUS | Status: AC
  Administered 2018-09-07: 6 mg via SUBCUTANEOUS

## 2018-09-07 NOTE — Progress Notes (Signed)
[facility]pt[facility]  [phone] [phone]  Patient Care Team: Virginia Crews, MD as PCP - General (Family Medicine) Clent Jacks, RN as Registered Nurse   Name of the patient: Karen Mann  [id number]  [birth year]   Date of visit: 09/07/18  Diagnosis-endometrial cancer  Chief complaint/ Reason for visit- Shortness of Breath  Heme/Onc history:  Karen Mann, 57 year old female initially seen in consultation from Dr. Gilman Schmidt for abnormal uterine bleeding.  PCP performed ultrasound which showed pelvic mass.  Patient had noticed bloating, fatigue, and constipation starting in July 2019.  FSH 6.4, LH 5.8 at that time.   07/23/2018 ultrasound showed pelvic mass measuring 20 x 13 x 22 cm with cystic and solid components.  Ultrasound also showed 4-5 centimeter fibroid and thickened endometrium at 15 mm.  MRI on 08/05/2018 revealed 20.6 cm complex cystic/solid mass in the pelvis with numerous thickened/enhancing septations compatible with malignant ovarian neoplasm.  Neither ovary was discretely visualized.  Left hydroureteronephrosis secondary to extrinsic compression.  Gross peritoneal disease not visualized.  Small volume ascites.  5.3 intramural anterior uterine body fibroid.   Endometrial biopsy was performed by Dr. Gilman Schmidt. Pathology: Endometrioid adenocarcinoma consistent with FIGO grade 3. CA 125- 622.0 (08/06/18).  She was referred to gynecologic oncology and saw Dr. Fransisca Connors.  Staging CT showed large cystic and solid mass arising from the ovary suspicious for ovarian malignancy.  Moderate ascites noted raising concern for malignant peritoneal spread.  No well-defined omental caking or solid tumor deposits noted.  Large right-sided pleural effusion over half of right hemithorax volume noted.  Liver hypodensity lesion 2.8 x 2.8 cm.  Moderate left and mild right hydronephrosis attributed to ureteral compression due to mass.    Neoadjuvant systemic therapy was recommended with carboplatin and paclitaxel for 3 cycles followed by imaging and re-evaluation with Dr. Fransisca Connors for possible debulking.  She initiated neoadjuvant carbo-Taxol with Neulasta support on 08/15/2018.  Patient was discussed at tumor conference.  Dr. Theora Gianotti recommended NexGen sequencing and to check for PDL 1.  She was found to be anemic due to heavy vaginal bleeding and complained of dyspnea. Given IV iron. Bleeding resolved & counts improved.   Family history significant for mother deceased of ovarian cancer at age 61.    Endometrial adenocarcinoma ([place])   08/10/2018 Initial Diagnosis    Endometrial adenocarcinoma ([place])    08/10/2018 -  Chemotherapy    The patient had palonosetron (ALOXI) injection 0.25 mg, 0.25 mg, Intravenous,  Once, 2 of 3 cycles Administration: 0.25 mg (08/15/2018), 0.25 mg (09/05/2018) pegfilgrastim (NEULASTA) injection 6 mg, 6 mg, Subcutaneous, Once, 2 of 3 cycles Administration: 6 mg (08/17/2018), 6 mg (09/07/2018) CARBOplatin (PARAPLATIN) 900 mg in sodium chloride 0.9 % 500 mL chemo infusion, 900 mg (100 % of original dose 900 mg), Intravenous,  Once, 2 of 3 cycles Dose modification:   (original dose 900 mg, Cycle 1) Administration: 900 mg (08/15/2018), 900 mg (09/05/2018) PACLitaxel (TAXOL) 402 mg in sodium chloride 0.9 % 500 mL chemo infusion (> 80mg /m2), 175 mg/m2 = 402 mg, Intravenous,  Once, 2 of 3 cycles Administration: 402 mg (08/15/2018), 402 mg (09/05/2018) fosaprepitant (EMEND) 150 mg, dexamethasone (DECADRON) 12 mg in sodium chloride 0.9 % 145 mL IVPB, , Intravenous,  Once, 2 of 3 cycles Administration:  (08/15/2018),  (09/05/2018)  for chemotherapy treatment.      Interval history-  Karen Mann, 57 year old female diagnosed with FIGO grade 3 endometrial adenocarcinoma, clinically with extrauterine disease including  intra-abdominal metastasis, ascites, pleural effusion who presents to symptom management clinic  with shortness of breath with exertion (approximately 1 flight of stairs). Symptoms include dyspnea on exertion and edema lower extremity edema (chronic; no worse). Symptoms are chronic but gradually worsening over past few days and she feels increasingly short of breath. She says that she feels like she needs 'fluid taken off'.  She monitors her weight at home daily and has noticed slight weight increase.  She has undergone therapeutic ultrasound-guided thoracentesis for recurrent right-sided pleural effusion since diagnosis with below history:  08/10/2018- 1.2L; cytology negative for malignancy 08/16/2018- 2.2L; cytology negative for malignancy.  Pleural fluid LDH 246, albumin 2.5, protein 4.2, serum LDH 270 protein level 6.4 08/22/2018- 3L; cytology negative for malignancy 08/30/2018-1.5 L  Previous work-up for pleural effusion included: 2D echo on 08/23/2018 showed LVEF 55 to 60%.  08/30/2018 chest x-ray showed interval reduction/resolution of right-sided pleural effusion status post thoracentesis without evidence of pneumothorax.  Prior evaluation by Dr. Tasia Catchings on 09/05/2018 revealed decreased/absent breath sounds in lower third of right lung, however, patient was asymptomatic and elected to hold off on thoracentesis at that time.  Additionally, she complains of lip numbness and tingling of tips of her fingers, which started after most recent cycle of chemotherapy.   She had suffered a mild chest tightness with desaturation to 89% during administration of taxol and was treated with steroids and benadryl. Taxol was re-started and she was able to tolerate Taxol at slower rate.  Dr. Tasia Catchings independently evaluated patient and advised Decadron 8 mg 2 days prior to next cycle.  ECOG FS:1 - Symptomatic but completely ambulatory  Review of systems- Review of Systems  Constitutional: Negative for chills, fever, malaise/fatigue and weight loss.  HENT: Negative for congestion, ear discharge, ear pain, sinus pain, sore  throat and tinnitus.   Eyes: Negative.   Respiratory: Positive for shortness of breath. Negative for cough, sputum production and wheezing.   Cardiovascular: Positive for leg swelling. Negative for chest pain, palpitations, orthopnea and claudication.  Gastrointestinal: Positive for nausea (controlled with nausea). Negative for abdominal pain, blood in stool, constipation, diarrhea, heartburn and vomiting.  Genitourinary: Negative.   Musculoskeletal: Negative.   Skin: Negative.   Neurological: Negative for dizziness, tingling, weakness and headaches.  Endo/Heme/Allergies: Negative.   Psychiatric/Behavioral: Positive for depression. The patient is nervous/anxious and has insomnia.    Current treatment- s/p cycle 2 neoaduvant carbo-taxol (09/05/18) w/ neulasta support (09/07/18)  Allergies  Allergen Reactions  . Penicillins    Past Medical History:  Diagnosis Date  . Allergy   . Anemia 07/20/2018  . Endometrial adenocarcinoma ([place]) 07/2018  . Fibroid uterus 07/24/2018   5cm/2 inch   . Ovarian mass 07/24/2018   22cm/10 inch  . Pelvic mass    Past Surgical History:  Procedure Laterality Date  . EYE SURGERY  2000   Lasik  . PORTA CATH INSERTION N/A 08/13/2018   Procedure: PORTA CATH INSERTION;  Surgeon: Algernon Huxley, MD;  Location: [facility];  Service: Cardiovascular;  Laterality: N/A;   Social History   Socioeconomic History  . Marital status: Married    Spouse name: Not on file  . Number of children: Not on file  . Years of education: Not on file  . Highest education level: Not on file  Occupational History  . Not on file  Social Needs  . Financial resource strain: Not on file  . Food insecurity:    Worry: Not on file  Inability: Not on file  . Transportation needs:    Medical: Not on file    Non-medical: Not on file  Tobacco Use  . Smoking status: Never Smoker  . Smokeless tobacco: Never Used  Substance and Sexual Activity  . Alcohol use: No  . Drug  use: No  . Sexual activity: Not Currently    Birth control/protection: Post-menopausal  Lifestyle  . Physical activity:    Days per week: Not on file    Minutes per session: Not on file  . Stress: Not on file  Relationships  . Social connections:    Talks on phone: Not on file    Gets together: Not on file    Attends religious service: Not on file    Active member of club or organization: Not on file    Attends meetings of clubs or organizations: Not on file    Relationship status: Not on file  . Intimate partner violence:    Fear of current or ex partner: Not on file    Emotionally abused: Not on file    Physically abused: Not on file    Forced sexual activity: Not on file  Other Topics Concern  . Not on file  Social History Narrative  . Not on file   Family History  Problem Relation Age of Onset  . Hypertension Father   . Ovarian cancer Mother   . Healthy Brother   . Ovarian cancer Maternal Aunt     Current Outpatient Medications:  .  ALPRAZolam (XANAX) 0.25 MG tablet, Take 1 tablet (0.25 mg total) by mouth at bedtime as needed for anxiety or sleep., Disp: 20 tablet, Rfl: 0 .  dexamethasone (DECADRON) 4 MG tablet, Take 2 tablets (8 mg total) by mouth See admin instructions. 2 days before and 2 days after chemotherapy, Disp: 30 tablet, Rfl: 1 .  lidocaine-prilocaine (EMLA) cream, Apply to affected area once, Disp: 30 g, Rfl: 3 .  ondansetron (ZOFRAN) 8 MG tablet, Take 1 tablet (8 mg total) by mouth 2 (two) times daily as needed for refractory nausea / vomiting. Start on day 3 after chemo., Disp: 30 tablet, Rfl: 1 .  oxyCODONE (ROXICODONE) 5 MG immediate release tablet, Take 1 tablet (5 mg total) by mouth every 6 (six) hours as needed for moderate pain, severe pain or breakthrough pain., Disp: 10 tablet, Rfl: 0 .  prochlorperazine (COMPAZINE) 10 MG tablet, Take 1 tablet (10 mg total) by mouth every 6 (six) hours as needed (Nausea or vomiting)., Disp: 30 tablet, Rfl: 1 .   rOPINIRole (REQUIP) 0.25 MG tablet, Take 1 tablet (0.25 mg total) by mouth at bedtime. Take 1-3 hours before bedtime, Disp: 30 tablet, Rfl: 3 .  furosemide (LASIX) 20 MG tablet, Take 1 tablet (20 mg total) by mouth daily as needed for fluid. (Patient not taking: Reported on 09/05/2018), Disp: 30 tablet, Rfl: 0 .  gabapentin (NEURONTIN) 300 MG capsule, Take 1 capsule (300 mg total) by mouth at bedtime., Disp: 30 capsule, Rfl: 0 .  Polyethylene Glycol 3350 (MIRALAX PO), Take by mouth daily. , Disp: , Rfl:  .  potassium chloride SA (K-DUR,KLOR-CON) 20 MEQ tablet, Take 1 tablet (20 mEq total) by mouth daily as needed (take on the days you take Lasix.). (Patient not taking: Reported on 09/05/2018), Disp: 60 tablet, Rfl: 0  Physical exam:  Vitals:   09/07/18 1023 09/07/18 1026  BP: 125/88   Pulse: 80   Resp: 20   Temp: 97.6 F (36.4 C)  TempSrc: Tympanic   SpO2: 97%   Weight:  224 lb (101.6 kg)  Height:  5\' 9"  (1.753 m)   Physical Exam  Constitutional: She is oriented to person, place, and time. She appears well-developed and well-nourished.  HENT:  Head: Normocephalic and atraumatic.  Mouth/Throat: Oropharynx is clear and moist. No oropharyngeal exudate.  Neck: Normal range of motion. Neck supple.  Cardiovascular: Normal rate, regular rhythm and normal heart sounds.  Pulmonary/Chest: Effort normal. No accessory muscle usage. No tachypnea. No respiratory distress. She has decreased breath sounds in the right middle field and the right lower field. She has no wheezes. She has no rhonchi. She has no rales.  Abdominal: Soft. Bowel sounds are normal. There is no tenderness. There is no rebound.  Musculoskeletal: Normal range of motion.       Right lower leg: She exhibits edema (1-2+). She exhibits no tenderness.       Left lower leg: She exhibits edema (2+). She exhibits no tenderness.  Neurological: She is alert and oriented to person, place, and time.  Skin: Skin is warm and dry. No pallor.    Psychiatric: Her mood appears anxious. She exhibits a depressed mood (tearful).     CMP Latest Ref Rng & Units 09/05/2018  Glucose 70 - 99 mg/dL 98  BUN 6 - 20 mg/dL 11  Creatinine 0.44 - 1.00 mg/dL 0.76  Sodium 135 - 145 mmol/L 137  Potassium 3.5 - 5.1 mmol/L 3.9  Chloride 98 - 111 mmol/L 105  CO2 22 - 32 mmol/L 23  Calcium 8.9 - 10.3 mg/dL 8.7(L)  Total Protein 6.5 - 8.1 g/dL 6.7  Total Bilirubin 0.3 - 1.2 mg/dL 0.7  Alkaline Phos 38 - 126 U/L 75  AST 15 - 41 U/L 14(L)  ALT 0 - 44 U/L 7   CBC Latest Ref Rng & Units 09/05/2018  WBC 3.6 - 11.0 K/uL 8.4  Hemoglobin 12.0 - 16.0 g/dL 9.7(L)  Hematocrit 35.0 - 47.0 % 30.4(L)  Platelets 150 - 440 K/uL 236    No images are attached to the encounter.  Dg Chest 1 View  Result Date: 08/22/2018 CLINICAL DATA:  Endometrial carcinoma with recurrent large right pleural effusion. Status post right thoracentesis. EXAM: CHEST  1 VIEW COMPARISON:  08/16/2018 FINDINGS: The heart size and mediastinal contours are within normal limits. Stable appearance of indwelling Port-A-Cath. Small amount of residual right pleural fluid after thoracentesis. No pneumothorax. No pulmonary edema or pulmonary nodules identified. The visualized skeletal structures are unremarkable. IMPRESSION: Only small amount of right pleural fluid present after thoracentesis. No pneumothorax. Electronically Signed   By: Aletta Edouard M.D.   On: 08/22/2018 15:20   Dg Chest 1 View  Result Date: 08/16/2018 CLINICAL DATA:  Status post right thoracentesis EXAM: CHEST  1 VIEW COMPARISON:  08/10/2018 FINDINGS: Small amount of underlying residual pleural fluid is noted. Some consolidated lung is again noted in the right base stable from the previous exam. No pneumothorax is noted following thoracentesis. The left lung remains clear. No bony abnormality is noted. IMPRESSION: No pneumothorax following right thoracentesis. Electronically Signed   By: Inez Catalina M.D.   On: 08/16/2018 10:57    Ct Chest W Contrast  Result Date: 08/09/2018 CLINICAL DATA:  Endometrial adenocarcinoma.  Pleural effusion. EXAM: CT CHEST, ABDOMEN, AND PELVIS WITH CONTRAST TECHNIQUE: Multidetector CT imaging of the chest, abdomen and pelvis was performed following the standard protocol during bolus administration of intravenous contrast. CONTRAST:  138mL ISOVUE-300 IOPAMIDOL (ISOVUE-300) INJECTION 61% COMPARISON:  Multiple exams, including abdominal/pelvic MRI of 08/05/2018; CT scan from 06/15/2006. FINDINGS: CT CHEST FINDINGS Cardiovascular: Unremarkable Mediastinum/Nodes: Unremarkable Lungs/Pleura: Large right pleural effusion occupying over half of the right hemithoracic volume. No well-defined pleural enhancement or pleural mass. Associated passive atelectasis. There is a band of atelectasis adjacent to fluid in the minor fissure. No suspicious nodule within the aerated long period Musculoskeletal: Thoracic spondylosis. CT ABDOMEN PELVIS FINDINGS Hepatobiliary: Several small gallstones in the gallbladder measure up to 7 mm in diameter, image 67/2. Subtle hypodensity just above the gallbladder fossa measuring up to 2.8 by 2.8 cm on image 62/2, somewhat triangular in morphology, potentially from focal fatty infiltration but technically nonspecific. Pancreas: Unremarkable Spleen: Unremarkable Adrenals/Urinary Tract: Moderate left and mild right hydronephrosis with moderate left hydroureter extending down to the iliac vessel cross over where the ureters. Compressed by the very large pelvic mass. Scattered scarring in the left kidney. There is also scarring in the right mid kidney. Stomach/Bowel: Mild thickening of the gastric cardia is probably incidental but technically nonspecific. Displacement of bowel loops by the large pelvic mass. Vascular/Lymphatic: Small retroperitoneal lymph nodes are not pathologically enlarged by size criteria. Reproductive: Cystic and solid mass dorsal to the uterus measures 28.4 by 11.6 by 21.4  cm (volume = 3690 cm^3). This is inseparable from both ovaries. Other: Moderate abdominal and pelvic ascites. Malignant ascites is not excluded although well-defined soft tissue nodules along the ascites are not well seen. There is stranding in the omentum and mesentery but without overt omental caking or solid separate tumor deposits. There is subcutaneous edema along the lower anterior abdominal wall extending to the pubis. Musculoskeletal: Severe right and moderate left degenerative hip arthropathy. Lumbar spondylosis and degenerative disc disease with suspected impingement at L4-5. Grade 1 degenerative anterolisthesis at L4-5. IMPRESSION: 1. A large cystic and solid mass likely arising from the ovaries measures 3690 cubic cm, suspicious for ovarian malignancy. Moderate ascites noted, raising concern for malignant peritoneal spread, but without well-defined omental caking or solid tumor deposits along the ascites. 2. Large right pleural effusion, over half of right hemithoracic volume. Likewise this may raise concern for malignant pleural effusion, but there is no enhancement along the pleural surface or nodularity observed. The right pleural effusion is nonspecific for transudative or exudative etiology. 3. Indistinct hypodensity in the liver just above the gallbladder fossa measuring 2.8 by 2.8 cm. This could reflect some mild focal fatty infiltration but is technically nonspecific. If clinically warranted, hepatic protocol MRI with and without contrast could be utilized for further characterization. Alternatively, PET-CT could provide complementary information. 4. There is moderate left and mild right hydronephrosis attributed to ureteral compression due to the mass. 5. Other imaging findings of potential clinical significance: Thoracolumbar spondylosis with impingement at L4-5. Cholelithiasis. Scattered scarring in the left kidney with mild scarring in the right kidney. Severe right and moderate left  degenerative hip arthropathy. Electronically Signed   By: Van Clines M.D.   On: 08/09/2018 12:26   Ct Abdomen Pelvis W Contrast  Result Date: 08/09/2018 CLINICAL DATA:  Endometrial adenocarcinoma.  Pleural effusion. EXAM: CT CHEST, ABDOMEN, AND PELVIS WITH CONTRAST TECHNIQUE: Multidetector CT imaging of the chest, abdomen and pelvis was performed following the standard protocol during bolus administration of intravenous contrast. CONTRAST:  147mL ISOVUE-300 IOPAMIDOL (ISOVUE-300) INJECTION 61% COMPARISON:  Multiple exams, including abdominal/pelvic MRI of 08/05/2018; CT scan from 06/15/2006. FINDINGS: CT CHEST FINDINGS Cardiovascular: Unremarkable Mediastinum/Nodes: Unremarkable Lungs/Pleura: Large right pleural effusion occupying over half of the right hemithoracic volume.  No well-defined pleural enhancement or pleural mass. Associated passive atelectasis. There is a band of atelectasis adjacent to fluid in the minor fissure. No suspicious nodule within the aerated long period Musculoskeletal: Thoracic spondylosis. CT ABDOMEN PELVIS FINDINGS Hepatobiliary: Several small gallstones in the gallbladder measure up to 7 mm in diameter, image 67/2. Subtle hypodensity just above the gallbladder fossa measuring up to 2.8 by 2.8 cm on image 62/2, somewhat triangular in morphology, potentially from focal fatty infiltration but technically nonspecific. Pancreas: Unremarkable Spleen: Unremarkable Adrenals/Urinary Tract: Moderate left and mild right hydronephrosis with moderate left hydroureter extending down to the iliac vessel cross over where the ureters. Compressed by the very large pelvic mass. Scattered scarring in the left kidney. There is also scarring in the right mid kidney. Stomach/Bowel: Mild thickening of the gastric cardia is probably incidental but technically nonspecific. Displacement of bowel loops by the large pelvic mass. Vascular/Lymphatic: Small retroperitoneal lymph nodes are not pathologically  enlarged by size criteria. Reproductive: Cystic and solid mass dorsal to the uterus measures 28.4 by 11.6 by 21.4 cm (volume = 3690 cm^3). This is inseparable from both ovaries. Other: Moderate abdominal and pelvic ascites. Malignant ascites is not excluded although well-defined soft tissue nodules along the ascites are not well seen. There is stranding in the omentum and mesentery but without overt omental caking or solid separate tumor deposits. There is subcutaneous edema along the lower anterior abdominal wall extending to the pubis. Musculoskeletal: Severe right and moderate left degenerative hip arthropathy. Lumbar spondylosis and degenerative disc disease with suspected impingement at L4-5. Grade 1 degenerative anterolisthesis at L4-5. IMPRESSION: 1. A large cystic and solid mass likely arising from the ovaries measures 3690 cubic cm, suspicious for ovarian malignancy. Moderate ascites noted, raising concern for malignant peritoneal spread, but without well-defined omental caking or solid tumor deposits along the ascites. 2. Large right pleural effusion, over half of right hemithoracic volume. Likewise this may raise concern for malignant pleural effusion, but there is no enhancement along the pleural surface or nodularity observed. The right pleural effusion is nonspecific for transudative or exudative etiology. 3. Indistinct hypodensity in the liver just above the gallbladder fossa measuring 2.8 by 2.8 cm. This could reflect some mild focal fatty infiltration but is technically nonspecific. If clinically warranted, hepatic protocol MRI with and without contrast could be utilized for further characterization. Alternatively, PET-CT could provide complementary information. 4. There is moderate left and mild right hydronephrosis attributed to ureteral compression due to the mass. 5. Other imaging findings of potential clinical significance: Thoracolumbar spondylosis with impingement at L4-5. Cholelithiasis.  Scattered scarring in the left kidney with mild scarring in the right kidney. Severe right and moderate left degenerative hip arthropathy. Electronically Signed   By: Van Clines M.D.   On: 08/09/2018 12:26   Dg Chest Port 1 View  Result Date: 08/30/2018 CLINICAL DATA:  Recurrent symptomatic right-sided pleural effusion, post right-sided thoracentesis EXAM: PORTABLE CHEST 1 VIEW COMPARISON:  Chest radiograph-08/22/2028; 08/16/2028 FINDINGS: Grossly unchanged cardiac silhouette and mediastinal contours. Stable position of support apparatus. Interval reduction/resolution of right-sided effusion post thoracentesis. No pneumothorax. Improved aeration of the right lung base with persistent minimal linear heterogeneous opacities about the right minor fissure favored to represent atelectasis. No new focal airspace opacities. No left-sided pleural effusion. No evidence of edema. No acute osseus abnormalities. IMPRESSION: Interval reduction/resolution of right-sided pleural effusion post thoracentesis. No pneumothorax. Electronically Signed   By: Sandi Mariscal M.D.   On: 08/30/2018 14:21   Dg Chest [facility]  1 View  Result Date: 08/10/2018 CLINICAL DATA:  Status post-thoracentesis right side . Ovarian mass. EXAM: PORTABLE CHEST - 1 VIEW COMPARISON:  CT from the previous day FINDINGS: No pneumothorax. Residual moderate right pleural effusion at the base. Some linear atelectasis/consolidation in the mid and lower right lung. Left lung clear. Heart size and mediastinal contours are within normal limits. Visualized bones unremarkable. IMPRESSION: 1. No pneumothorax post thoracentesis. 2. Moderate residual right pleural effusion with adjacent atelectasis/consolidation at the right lung base Electronically Signed   By: Lucrezia Europe M.D.   On: 08/10/2018 12:00   US Thoracentesis Asp Pleural Space W/img Guide  Result Date: 08/30/2018 INDICATION: History of endometrial adenocarcinoma with recurrent symptomatic right-sided  pleural effusion. Patient presents today for ultrasound-guided right-sided thoracentesis for therapeutic purposes. EXAM: US THORACENTESIS ASP PLEURAL SPACE W/IMG GUIDE COMPARISON:  Ultrasound-guided thoracentesis-08/22/2018; 08/16/2018; 08/10/2018; chest radiograph-08/22/2018 MEDICATIONS: None. COMPLICATIONS: None immediate. TECHNIQUE: Informed written consent was obtained from the patient after a discussion of the risks, benefits and alternatives to treatment. A timeout was performed prior to the initiation of the procedure. Initial ultrasound scanning demonstrates a recurrent large right-sided anechoic pleural effusion. The lower chest was prepped and draped in the usual sterile fashion. 1% lidocaine was used for local anesthesia. Under direct ultrasound guidance, a 19 gauge, 7-cm, Yueh catheter was introduced. An ultrasound image was saved for documentation purposes. The thoracentesis was performed. The catheter was removed and a dressing was applied. The patient tolerated the procedure well without immediate post procedural complication. The patient was escorted to have an upright chest radiograph. FINDINGS: A total of approximately 1.5 liters of serous fluid was removed. IMPRESSION: Successful ultrasound-guided right sided thoracentesis yielding 1.5 liters of pleural fluid. PLAN: The patient is to start a new line of chemotherapy next week. She is hopeful this new treatment will result in a reduction of her recurrent symptomatic pleural fluid. If this is not case and patient continues to require weekly large volume thoracentesis, evaluation for placement of a palliative PleurX catheter could be considered. Electronically Signed   By: Sandi Mariscal M.D.   On: 08/30/2018 14:25   US Thoracentesis Asp Pleural Space W/img Guide  Result Date: 08/22/2018 CLINICAL DATA:  Endometrial carcinoma with recurrent right pleural effusion. EXAM: ULTRASOUND GUIDED RIGHT THORACENTESIS COMPARISON:  08/16/2018 PROCEDURE: An  ultrasound guided thoracentesis was thoroughly discussed with the patient and questions answered. The benefits, risks, alternatives and complications were also discussed. The patient understands and wishes to proceed with the procedure. Written consent was obtained. A time-out was performed prior to initiating the procedure. Ultrasound was performed to localize and mark an adequate pocket of fluid in the right chest. The area was then prepped and draped in the normal sterile fashion. 1% Lidocaine was used for local anesthesia. Under ultrasound guidance a 6 French Safe-T-Centesis catheter was introduced. Thoracentesis was performed. The catheter was removed and a dressing applied. COMPLICATIONS: None FINDINGS: A total of approximately 3 L of amber color fluid was removed. A fluid sample was sent for laboratory analysis. IMPRESSION: Successful ultrasound guided right thoracentesis yielding 3 L of pleural fluid. Electronically Signed   By: Aletta Edouard M.D.   On: 08/22/2018 14:50   US Thoracentesis Asp Pleural Space W/img Guide  Result Date: 08/16/2018 INDICATION: Recurrent right pleural effusion, history of endometrial carcinoma EXAM: ULTRASOUND GUIDED RIGHT THORACENTESIS MEDICATIONS: None. COMPLICATIONS: None immediate. PROCEDURE: An ultrasound guided thoracentesis was thoroughly discussed with the patient and questions answered. The benefits, risks, alternatives and complications were also  discussed. The patient understands and wishes to proceed with the procedure. Written consent was obtained. Ultrasound was performed to localize and mark an adequate pocket of fluid in the right chest. The area was then prepped and draped in the normal sterile fashion. 1% Lidocaine was used for local anesthesia. Under ultrasound guidance a 6 Fr Safe-T-Centesis catheter was introduced. Thoracentesis was performed. The catheter was removed and a dressing applied. FINDINGS: A total of approximately 2.2 L of mildly bloody fluid  was removed. Samples were sent to the laboratory as requested by the clinical team. IMPRESSION: Successful ultrasound guided right thoracentesis yielding 2.2 L of pleural fluid. Electronically Signed   By: Inez Catalina M.D.   On: 08/16/2018 10:56   US Thoracentesis Asp Pleural Space W/img Guide  Result Date: 08/10/2018 INDICATION: Ovarian mass.  Large right pleural effusion. EXAM: ULTRASOUND GUIDED RIGHT THORACENTESIS MEDICATIONS: None. COMPLICATIONS: None immediate. PROCEDURE: An ultrasound guided thoracentesis was thoroughly discussed with the patient and questions answered. The benefits, risks, alternatives and complications were also discussed. The patient understands and wishes to proceed with the procedure. Written consent was obtained. Ultrasound was performed to localize and mark an adequate pocket of fluid in the right chest. The area was then prepped and draped in the normal sterile fashion. 1% Lidocaine was used for local anesthesia. Under ultrasound guidance a Safe-T-Centesis catheter was introduced. Thoracentesis was performed. The catheter was removed and a dressing applied. FINDINGS: A total of approximately 1.2 L of blood tinged fluid was removed. Samples were sent to the laboratory as requested by the clinical team. The patient tolerated the procedure well. IMPRESSION: Successful ultrasound guided right thoracentesis yielding 1.2 L of pleural fluid. No pneumothorax on follow-up radiograph. Electronically Signed   By: Lucrezia Europe M.D.   On: 08/10/2018 12:02    Assessment and plan- Patient is a 57 y.o. female is diagnosed with adenocarcinoma who presents to symptom management clinic for shortness of breath.  1. Endometrial Adenocarcinoma-FIGO grade 3 endometrial adenocarcinoma likely with extrauterine disease involving intra-abdominal metastases, ascites, pleural effusion (see below) and ovarian involvement/concomittant malignancy.  Status post cycle 2 of neoadjuvant carboplatin and Taxol.    CA 125 - 875 on 09/05/2018. Tolerating chemotherapy well except for chest tightness with recent Taxol administration.  Premedication with Decadron 8 mg p.o for 2 days prior to chemotherapy added by Dr. Tasia Catchings.  Dr. Tasia Catchings to order imaging following cycle 3 and patient will return to GYN oncology clinic for reevaluation and consideration of debulking surgery at that time.   2. Recurrent pleural effusion- hx of recurrent pleural effusions, cytology negative for malignancy; positive for macrophages, neutrophils, lymphocytes, and reactive mesothelial cells. LD elevated consistent with exudative.  Again discussed that it is hopeful that with additional cycles of chemotherapy there will be a reduction in her recurrent, symptomatic pleural fluid.  However, if she continues to require regular large-volume thoracentesis, we could also consider palliative Pleurx catheter placement.  Given that she is symptomatic today with shortness of breath, we will proceed with thoracentesis.  3. Lower Extremity Edema- L > R. Previously failed to have improvement w/ lasix. Likely d/t abdominal ascites vs compression from masses. Discussed importance of ambulation, compression, and elevation. Continue to monitor.   4.  Chemotherapy-induced peripheral neuropathy and lip numbness-suspect related to Taxol.  Discussed with Dr. Tasia Catchings who recommends initiation of gabapentin 300 mg nightly.   5.  Anxiety associated with cancer diagnosis-increasingly tearful and depressed.  Reports poor sleep due to anxiety, poorly controlled with Xanax.  Discussed self referral to counseling services with Janeann Merl and/or Lexapro or Cymbalta (given CIPN).  Patient wishes to defer medication changes at this time and will consider counseling.  Continue to monitor.  Therapeutic thoracentesis for 09/10/2018 (first available).  Follow-up with Dr. Tasia Catchings for reevaluation on 09/11/2018.   Visit Diagnosis 1. Endometrial adenocarcinoma ([place])   2. Recurrent pleural effusion  on right   3. Lower extremity edema   4. Chemotherapy-induced peripheral neuropathy ([place])   5. Anxiety associated with cancer diagnosis     Patient expressed understanding and was in agreement with this plan. She also understands that She can call clinic at any time with any questions, concerns, or complaints.   Thank you for allowing me to participate in the care of this very pleasant patient.   Beckey Rutter, DNP, AGNP-C [place] at [facility] [phone] (work cell) [phone] (office)

## 2018-09-07 NOTE — Telephone Encounter (Signed)
2nd message left today, 09/07/18, for Karen Mann to see whether she wants to schedule a genetic counseling appt.

## 2018-09-07 NOTE — Telephone Encounter (Signed)
Called pt to notify her that thoracentesis appointment is on Monday (09/10/18) @ 1030am.

## 2018-09-10 ENCOUNTER — Ambulatory Visit
Admission: RE | Admit: 2018-09-10 | Discharge: 2018-09-10 | Disposition: A | Discharge status: Home / Self Care | Payer: 59 | Source: Ambulatory Visit | Attending: Interventional Radiology | Admitting: Interventional Radiology

## 2018-09-10 ENCOUNTER — Ambulatory Visit
Admission: RE | Admit: 2018-09-10 | Discharge: 2018-09-10 | Disposition: A | Discharge status: Home / Self Care | Payer: 59 | Source: Ambulatory Visit | Attending: Nurse Practitioner | Admitting: Nurse Practitioner

## 2018-09-10 ENCOUNTER — Other Ambulatory Visit: Payer: Self-pay | Admitting: Genetic Counselor

## 2018-09-10 ENCOUNTER — Encounter: Payer: Self-pay | Admitting: Genetic Counselor

## 2018-09-10 ENCOUNTER — Telehealth: Payer: Self-pay | Admitting: Genetic Counselor

## 2018-09-10 DIAGNOSIS — J910 Malignant pleural effusion: Secondary | ICD-10-CM | POA: Diagnosis not present

## 2018-09-10 DIAGNOSIS — J90 Pleural effusion, not elsewhere classified: Secondary | ICD-10-CM

## 2018-09-10 DIAGNOSIS — C541 Malignant neoplasm of endometrium: Secondary | ICD-10-CM | POA: Insufficient documentation

## 2018-09-10 DIAGNOSIS — R0602 Shortness of breath: Secondary | ICD-10-CM | POA: Diagnosis not present

## 2018-09-10 NOTE — Telephone Encounter (Signed)
Cancer Genetics            Telegenetics Initial Visit    Patient Name: Karen Mann Patient DOB: Aug 10, 1961 Patient Age: 57 y.o. Phone Call Date: 09/10/2018  Referring Provider: Earlie Server, MD  Reason for Visit: Evaluate for hereditary susceptibility to cancer    Assessment and Plan:  . Karen Mann's history of uterine cancer at age 43, in combination with her mother's ovarian cancer, warrants a genetics evaluation. Her mother had only one sister. Additionally, her father was an only child. This paucity of women makes risk assessment challenging.   . Testing is recommended to determine whether she has a pathogenic mutation that will impact her screening and risk-reduction for cancer. A negative result will be reassuring.  . Karen Mann wished to pursue genetic testing, and will schedule a blood draw. Analysis will include the 84 genes on [facility]'s Multi-Cancer panel (AIP, ALK, APC, ATM, AXIN2, BAP1, BARD1, BLM, BMPR1A, BRCA1, BRCA2, BRIP1, CASR, CDC73, CDH1, CDK4, CDKN1B, CDKN1C, CDKN2A, CEBPA, CHEK2, CTNNA1, DICER1, DIS3L2, EGFR, EPCAM, FH, FLCN, GATA2, GPC3, GREM1, HOXB13, HRAS, KIT, MAX, MEN1, MET, MITF, MLH1, MSH2, MSH3, MSH6, MUTYH, NBN, NF1, NF2, NTHL1, PALB2, PDGFRA, PHOX2B, PMS2, POLD1, POLE, POT1, PRKAR1A, PTCH1, PTEN, RAD50, RAD51C, RAD51D, RB1, RECQL4, RET, RUNX1, SDHA, SDHAF2, SDHB, SDHC, SDHD, SMAD4, SMARCA4, SMARCB1, SMARCE1, STK11, SUFU, TERC, TERT, TMEM127, TP53, TSC1, TSC2, VHL, WRN, WT1).   . Once the lab receives her specimen, results should be available in approximately 2-3 weeks, at which point we will contact her and address implications for her as well as address genetic testing for at-risk family members, if needed.     Dr. Grayland Ormond was available for questions concerning this case. Total time spent by counseling by phone was approximately 20 minutes.   _____________________________________________________________________   History of Present  Illness: Karen Mann, a 57 y.o. female, was referred for genetic counseling to discuss the possibility of a hereditary predisposition to cancer and discuss whether genetic testing is warranted. This was a telegenetics visit via phone.  Karen Mann was diagnosed with uterine cancer at the age of 53. She is currently receiving neoadjuvant chemotherapy.   Karen Mann reported that her last mammogram was over 3 years ago. She has not had a colonoscopy, but is supposed to send in her Cologaurd kit.     Endometrial adenocarcinoma ([place])   08/10/2018 Initial Diagnosis    Endometrial adenocarcinoma ([place])    08/10/2018 -  Chemotherapy    The patient had palonosetron (ALOXI) injection 0.25 mg, 0.25 mg, Intravenous,  Once, 2 of 3 cycles Administration: 0.25 mg (08/15/2018), 0.25 mg (09/05/2018) pegfilgrastim (NEULASTA) injection 6 mg, 6 mg, Subcutaneous, Once, 2 of 3 cycles Administration: 6 mg (08/17/2018), 6 mg (09/07/2018) CARBOplatin (PARAPLATIN) 900 mg in sodium chloride 0.9 % 500 mL chemo infusion, 900 mg (100 % of original dose 900 mg), Intravenous,  Once, 2 of 3 cycles Dose modification:   (original dose 900 mg, Cycle 1) Administration: 900 mg (08/15/2018), 900 mg (09/05/2018) PACLitaxel (TAXOL) 402 mg in sodium chloride 0.9 % 500 mL chemo infusion (> 62m/m2), 175 mg/m2 = 402 mg, Intravenous,  Once, 2 of 3 cycles Administration: 402 mg (08/15/2018), 402 mg (09/05/2018) fosaprepitant (EMEND) 150 mg, dexamethasone (DECADRON) 12 mg in sodium chloride 0.9 % 145 mL IVPB, , Intravenous,  Once, 2 of 3 cycles Administration:  (08/15/2018),  (09/05/2018)  for chemotherapy treatment.      Past  Medical History:  Diagnosis Date  . Allergy   . Anemia 07/20/2018  . Endometrial adenocarcinoma ([place]) 07/2018  . Fibroid uterus 07/24/2018   5cm/2 inch   . Ovarian mass 07/24/2018   22cm/10 inch  . Pelvic mass     Past Surgical History:  Procedure Laterality Date  . EYE SURGERY  2000   Lasik  .  PORTA CATH INSERTION N/A 08/13/2018   Procedure: PORTA CATH INSERTION;  Surgeon: Algernon Huxley, MD;  Location: [facility];  Service: Cardiovascular;  Laterality: N/A;    Family History: Significant diagnoses include the following:  Family History  Problem Relation Age of Onset  . Hypertension Father   . Ovarian cancer Mother 31       deceased 46  . Healthy Brother   . Skin cancer Paternal Grandmother        deceased 56s  . Cancer Paternal Grandfather        unk. primary; deceased 43s  . Ovarian cancer Other        mat grandmother's sister    Additionally, Karen Mann has a son (age 32) who has 2 daughters. She has one brother (age 26) who has no children. Her mother (noted above) had only one sister (age 27). Her maternal grandmother died at 50, cancer-free, and is the one who had a sister with ovarian cancer. Her father (age 34) is an only child.  Karen Mann's ancestry is Caucasian - NOS. There is no known Jewish ancestry and no consanguinity.  Discussion: We reviewed the characteristics, features and inheritance patterns of hereditary cancer syndromes. We discussed her risk of harboring a mutation in the context of her personal and family history. We discussed that her small paternal family and paucity of women makes risk assessment challenging. We discussed the process of genetic testing, insurance coverage and implications of results: positive, negative and variant of uncertain significance (VUS).   Karen Mann questions were answered to her satisfaction today and she is welcome to call with any additional questions or concerns. Thank you for the referral and allowing [place] to share in the care of your patient.    Steele Berg, MS, [place] Certified Genetic Counselor phone: [phone]

## 2018-09-10 NOTE — Procedures (Signed)
  Procedure: US R thoracentesis   EBL:   minimal Complications:  none immediate  See full dictation in [facility] PACS.  D. Tanikka Bresnan MD Main # [phone] Pager  [phone]      

## 2018-09-11 ENCOUNTER — Inpatient Hospital Stay (HOSPITAL_BASED_OUTPATIENT_CLINIC_OR_DEPARTMENT_OTHER): Payer: 59 | Admitting: Oncology

## 2018-09-11 ENCOUNTER — Encounter: Payer: Self-pay | Admitting: Oncology

## 2018-09-11 ENCOUNTER — Inpatient Hospital Stay: Payer: 59

## 2018-09-11 ENCOUNTER — Other Ambulatory Visit: Payer: Self-pay

## 2018-09-11 VITALS — BP 117/79 | HR 94 | Temp 96.3°F | Resp 18 | Wt 221.9 lb

## 2018-09-11 DIAGNOSIS — D500 Iron deficiency anemia secondary to blood loss (chronic): Secondary | ICD-10-CM | POA: Diagnosis not present

## 2018-09-11 DIAGNOSIS — Z8041 Family history of malignant neoplasm of ovary: Secondary | ICD-10-CM

## 2018-09-11 DIAGNOSIS — N1330 Unspecified hydronephrosis: Secondary | ICD-10-CM

## 2018-09-11 DIAGNOSIS — J90 Pleural effusion, not elsewhere classified: Secondary | ICD-10-CM | POA: Diagnosis not present

## 2018-09-11 DIAGNOSIS — F419 Anxiety disorder, unspecified: Secondary | ICD-10-CM

## 2018-09-11 DIAGNOSIS — D259 Leiomyoma of uterus, unspecified: Secondary | ICD-10-CM

## 2018-09-11 DIAGNOSIS — T451X5A Adverse effect of antineoplastic and immunosuppressive drugs, initial encounter: Secondary | ICD-10-CM

## 2018-09-11 DIAGNOSIS — C541 Malignant neoplasm of endometrium: Secondary | ICD-10-CM

## 2018-09-11 DIAGNOSIS — R600 Localized edema: Secondary | ICD-10-CM

## 2018-09-11 DIAGNOSIS — R188 Other ascites: Secondary | ICD-10-CM

## 2018-09-11 DIAGNOSIS — G620 Drug-induced polyneuropathy: Secondary | ICD-10-CM

## 2018-09-11 DIAGNOSIS — M7989 Other specified soft tissue disorders: Secondary | ICD-10-CM

## 2018-09-11 DIAGNOSIS — G4700 Insomnia, unspecified: Secondary | ICD-10-CM

## 2018-09-11 DIAGNOSIS — R5383 Other fatigue: Secondary | ICD-10-CM

## 2018-09-11 DIAGNOSIS — Z79899 Other long term (current) drug therapy: Secondary | ICD-10-CM

## 2018-09-11 LAB — CBC WITH DIFFERENTIAL/PLATELET
Basophils Absolute: 0.1 10*3/uL (ref 0–0.1)
Basophils Relative: 1 %
EOS ABS: 0.6 10*3/uL (ref 0–0.7)
EOS PCT: 3 %
HCT: 31.7 % — ABNORMAL LOW (ref 35.0–47.0)
Hemoglobin: 10 g/dL — ABNORMAL LOW (ref 12.0–16.0)
LYMPHS ABS: 1.6 10*3/uL (ref 1.0–3.6)
LYMPHS PCT: 8 %
MCH: 24.8 pg — AB (ref 26.0–34.0)
MCHC: 31.4 g/dL — AB (ref 32.0–36.0)
MCV: 78.8 fL — AB (ref 80.0–100.0)
MONOS PCT: 2 %
Monocytes Absolute: 0.4 10*3/uL (ref 0.2–0.9)
Neutro Abs: 16.8 10*3/uL — ABNORMAL HIGH (ref 1.4–6.5)
Neutrophils Relative %: 86 %
PLATELETS: 153 10*3/uL (ref 150–440)
RBC: 4.02 MIL/uL (ref 3.80–5.20)
RDW: 24.9 % — ABNORMAL HIGH (ref 11.5–14.5)
WBC: 19.5 10*3/uL — ABNORMAL HIGH (ref 3.6–11.0)

## 2018-09-11 LAB — COMPREHENSIVE METABOLIC PANEL
ALT: 11 U/L (ref 0–44)
AST: 18 U/L (ref 15–41)
Albumin: 3.4 g/dL — ABNORMAL LOW (ref 3.5–5.0)
Alkaline Phosphatase: 103 U/L (ref 38–126)
Anion gap: 9 (ref 5–15)
BUN: 13 mg/dL (ref 6–20)
CHLORIDE: 104 mmol/L (ref 98–111)
CO2: 24 mmol/L (ref 22–32)
CREATININE: 0.74 mg/dL (ref 0.44–1.00)
Calcium: 8.8 mg/dL — ABNORMAL LOW (ref 8.9–10.3)
GFR calc Af Amer: >60 mL/min (ref >60)
Glucose, Bld: 101 mg/dL — ABNORMAL HIGH (ref 70–99)
POTASSIUM: 4 mmol/L (ref 3.5–5.1)
Sodium: 137 mmol/L (ref 135–145)
Total Bilirubin: 0.7 mg/dL (ref 0.3–1.2)
Total Protein: 6.4 g/dL — ABNORMAL LOW (ref 6.5–8.1)

## 2018-09-11 NOTE — Progress Notes (Signed)
Patient here for follow up. No concerns voiced. Pt states feeling better after thoracentesis. Denies shortness of breath

## 2018-09-11 NOTE — Progress Notes (Signed)
Hematology/Oncology follow up  note [facility] Telephone:([phone] Fax:([phone]) [phone]   Patient Care Team: Virginia Crews, MD as PCP - General (Family Medicine) Clent Jacks, RN as Registered Nurse  REFERRING PROVIDER: Dr.Berchuck CHIEF COMPLAINTS/REASON FOR VISIT:  Evaluation of endometrial cancer.   HISTORY OF PRESENTING ILLNESS:  Karen Mann is a  57 y.o.  female with PMH listed below who was referred to me for evaluation of newly diagnosed endometrial cancer. Patient was recently referred to GYN Dr. Gilman Schmidt for abnormal uterine bleeding. 07/23/2018 ultrasound was performed showed large pelvic mass 20 x 13 x 22 cm with cystic and solid components.  Ultrasound also showed 4.9 cm fibroid and thickened endometrium 15 mm thickness 08/05/2018 MRI pelvis with and without contrast 20.6 cm complex cystic solid mass in the pelvis with numerous thickened enhancing septations, compatible with malignant ovarian neoplasm.  Neither ovary was discretely visualized.  Small volume of ascites.  Cross paratonia disease is not visualized but not excluded.  Left hydro-ureteronephrosis secondary to extrinsic compression. 5.3 intramural anterior uterine body fibroid.  #Endometrial biopsy was performed.  Pathology showed endometrioid adenocarcinoma consistent with FIGO grade 3.  8/12/19CA125 was elevated at 622 Patient was referred to see Dr. Fransisca Connors.  She was evaluated by Dr. Meredith Mody on 08/08/2018.  Staging CT was obtained. #Images independently reviewed by me.   Staging CT showed large cystic and a solid mass likely arising from the ovaries, measures 369 0 cc, suspicious for ovarian malignancy.  Moderate ascites noted.  Raising concern for malignant peritoneal spread.  No well-defined omental caking or solid tumor deposits along a situs., -Large right pleural effusion over half of right hemithorax volume. -Liver hypodensity 2.8 x 2.8 cm -Moderate left and mild right  hydronephrosis attributed to the ureteral compression due to mass. -Other chronic image findings.  MRI pelvis with and without contrast again showed 20.6 cm complex cystic/solid mass in the pelvis.  Compatible with malignant ovarian neoplasm.  Small volume of a situs.  Cross peritoneal disease is not visualized but cannot be excluded.  Left hydroureteronephrosis secondary to compression.  #Patient was scheduled to have a diagnostic and therapeutic thoracentesis this morning.  Cytology pending. She feels breathing is much better after the thoracentesis. Mild shortness of breath, worse with exertion.  Heart palpitation as well denies any chest pain, abdominal pain. Ongoing vaginal bleeding, she passed a big clot today. Feel extremely fatigued. She feels overwhelming and very nervous about the cancer diagnosis.  She has insomnia and she has tried melatonin which did not help with the sleep.  # Dr. Fransisca Connors recommends systemic therapy with carboplatin and paclitaxel 3 cycles, reassessment for surgical evaluation.per Dr.Berchuck, not candidate for Pembrolizumab and Levetinib trial.   # 08/10/2018 thoracentesis  with 1.2 L fluid removed.  Cytology negative for malignancy. 08/16/2018 status post thoracentesis again and removed 2.2 L of mildly bloody fluid.  I discussed with Dr. Dicie Beam and she reviewed the slides there was no malignancy cells. Pleural fluid LDH 246, albumin 2.5, protein 4.2 Serum LDH 270.  Protein level 6.4 08/22/2018, another thoracentesis both liters of fluid.  Cytology negative.  Patient's case was discussed on GYN tumor conference.  Dr. Theora Gianotti recommends NexGen sequence molecular testing.  And also check for PDL 1.    INTERVAL HISTORY [name] is a 57 y.o. female who has above history reviewed by me for assessing chemotherapy toxicities and recurrent pleural effusion.   # Chemotherapy, reports feeling tired after taking chemotherapy. S/p 2  cycles of Carboplatin and Taxol.    # Recurrent pleural effusion, she reports feeling more SOB a few days ago and was seen by NP. She had a repeat thoracentesis done and removed 1.7L fluid.  SOB has improved since then.  # Vaginal bleeding has resolved.    Review of Systems  Constitutional: Positive for malaise/fatigue. Negative for chills, fever and weight loss.  HENT: Negative for nosebleeds and sore throat.   Eyes: Negative for double vision, photophobia and redness.  Respiratory: Positive for shortness of breath. Negative for cough and wheezing.   Cardiovascular: Negative for chest pain, palpitations and orthopnea.  Gastrointestinal: Negative for abdominal pain, blood in stool, nausea and vomiting.       Abdominal distention  Genitourinary: Negative for dysuria.  Musculoskeletal: Negative for back pain, myalgias and neck pain.       Chronic back pain  Skin: Negative for itching and rash.  Neurological: Negative for dizziness, tingling and tremors.  Endo/Heme/Allergies: Negative for environmental allergies. Does not bruise/bleed easily.  Psychiatric/Behavioral: Negative for depression. The patient is not nervous/anxious.     MEDICAL HISTORY:  Past Medical History:  Diagnosis Date  . Allergy   . Anemia 07/20/2018  . Endometrial adenocarcinoma ([place]) 07/2018  . Fibroid uterus 07/24/2018   5cm/2 inch   . Ovarian mass 07/24/2018   22cm/10 inch  . Pelvic mass     SURGICAL HISTORY: Past Surgical History:  Procedure Laterality Date  . EYE SURGERY  2000   Lasik  . PORTA CATH INSERTION N/A 08/13/2018   Procedure: PORTA CATH INSERTION;  Surgeon: Algernon Huxley, MD;  Location: [place] [facility];  Service: Cardiovascular;  Laterality: N/A;    SOCIAL HISTORY: Social History   Socioeconomic History  . Marital status: Married    Spouse name: Not on file  . Number of children: Not on file  . Years of education: Not on file  . Highest education level: Not on file  Occupational History  . Not on file  Social  Needs  . Financial resource strain: Not on file  . Food insecurity:    Worry: Not on file    Inability: Not on file  . Transportation needs:    Medical: Not on file    Non-medical: Not on file  Tobacco Use  . Smoking status: Never Smoker  . Smokeless tobacco: Never Used  Substance and Sexual Activity  . Alcohol use: No  . Drug use: No  . Sexual activity: Not Currently    Birth control/protection: Post-menopausal  Lifestyle  . Physical activity:    Days per week: Not on file    Minutes per session: Not on file  . Stress: Not on file  Relationships  . Social connections:    Talks on phone: Not on file    Gets together: Not on file    Attends religious service: Not on file    Active member of club or organization: Not on file    Attends meetings of clubs or organizations: Not on file    Relationship status: Not on file  . Intimate partner violence:    Fear of current or ex partner: Not on file    Emotionally abused: Not on file    Physically abused: Not on file    Forced sexual activity: Not on file  Other Topics Concern  . Not on file  Social History Narrative  . Not on file    FAMILY HISTORY: Family History  Problem Relation Age of  Onset  . Hypertension Father   . Ovarian cancer Mother 49       deceased 59  . Healthy Brother   . Skin cancer Paternal Grandmother        deceased 78s  . Cancer Paternal Grandfather        unk. primary; deceased 81s  . Ovarian cancer Other        mat grandmother's sister    ALLERGIES:  is allergic to penicillins.  MEDICATIONS:  Current Outpatient Medications  Medication Sig Dispense Refill  . ALPRAZolam (XANAX) 0.25 MG tablet Take 1 tablet (0.25 mg total) by mouth at bedtime as needed for anxiety or sleep. 20 tablet 0  . dexamethasone (DECADRON) 4 MG tablet Take 2 tablets (8 mg total) by mouth See admin instructions. 2 days before and 2 days after chemotherapy 30 tablet 1  . gabapentin (NEURONTIN) 300 MG capsule Take 1 capsule  (300 mg total) by mouth at bedtime. 30 capsule 0  . lidocaine-prilocaine (EMLA) cream Apply to affected area once 30 g 3  . ondansetron (ZOFRAN) 8 MG tablet Take 1 tablet (8 mg total) by mouth 2 (two) times daily as needed for refractory nausea / vomiting. Start on day 3 after chemo. 30 tablet 1  . oxyCODONE (ROXICODONE) 5 MG immediate release tablet Take 1 tablet (5 mg total) by mouth every 6 (six) hours as needed for moderate pain, severe pain or breakthrough pain. 10 tablet 0  . Polyethylene Glycol 3350 (MIRALAX PO) Take by mouth daily.     . prochlorperazine (COMPAZINE) 10 MG tablet Take 1 tablet (10 mg total) by mouth every 6 (six) hours as needed (Nausea or vomiting). 30 tablet 1  . furosemide (LASIX) 20 MG tablet Take 1 tablet (20 mg total) by mouth daily as needed for fluid. (Patient not taking: Reported on 09/05/2018) 30 tablet 0  . potassium chloride SA (K-DUR,KLOR-CON) 20 MEQ tablet Take 1 tablet (20 mEq total) by mouth daily as needed (take on the days you take Lasix.). (Patient not taking: Reported on 09/05/2018) 60 tablet 0  . rOPINIRole (REQUIP) 0.25 MG tablet Take 1 tablet (0.25 mg total) by mouth at bedtime. Take 1-3 hours before bedtime (Patient not taking: Reported on 09/11/2018) 30 tablet 3   No current facility-administered medications for this visit.      PHYSICAL EXAMINATION: ECOG PERFORMANCE STATUS: 1 - Symptomatic but completely ambulatory Vitals:   09/11/18 1058  BP: 117/79  Pulse: 94  Resp: 18  Temp: (!) 96.3 F (35.7 C)   Filed Weights   09/11/18 1058  Weight: 221 lb 14.4 oz (100.7 kg)    Physical Exam  Constitutional: She is oriented to person, place, and time. No distress.  HENT:  Head: Normocephalic and atraumatic.  Mouth/Throat: Oropharynx is clear and moist.  Eyes: Pupils are equal, round, and reactive to light. EOM are normal. No scleral icterus.  Pale conjunctivae  Neck: Normal range of motion. Neck supple.  Cardiovascular: Normal rate, regular  rhythm and normal heart sounds.  Pulmonary/Chest: Effort normal. No respiratory distress. She has no wheezes.  Decreased breath sound at right base. Improved.   Abdominal: Soft. Bowel sounds are normal. She exhibits mass. She exhibits no distension. There is no tenderness.  Palpable lower abdominal/pelvis mass  Musculoskeletal: Normal range of motion. She exhibits edema. She exhibits no deformity.  Bilateral 2+ pitting edema  Neurological: She is alert and oriented to person, place, and time. No cranial nerve deficit. Coordination normal.  Skin: Skin is  warm and dry. No rash noted. No erythema.  Psychiatric: She has a normal mood and affect. Her behavior is normal. Thought content normal.     LABORATORY DATA:  I have reviewed the data as listed Lab Results  Component Value Date   WBC 19.5 (H) 09/11/2018   HGB 10.0 (L) 09/11/2018   HCT 31.7 (L) 09/11/2018   MCV 78.8 (L) 09/11/2018   PLT 153 09/11/2018   Recent Labs    08/29/18 1329 09/05/18 0819 09/11/18 1050  NA 136 137 137  K 4.0 3.9 4.0  CL 104 105 104  CO2 22 23 24   GLUCOSE 103* 98 101*  BUN 11 11 13   CREATININE 0.91 0.76 0.74  CALCIUM 8.7* 8.7* 8.8*  GFRNONAA >60 >60 >60  GFRAA >60 >60 >60  PROT 7.0 6.7 6.4*  ALBUMIN 3.2* 3.1* 3.4*  AST 17 14* 18  ALT 12 7 11   ALKPHOS 97 75 103  BILITOT 0.8 0.7 0.7   Iron/TIBC/Ferritin/ %Sat    Component Value Date/Time   IRON 25 (L) 09/05/2018 0819   IRON 11 (L) 07/17/2018 1124   TIBC 245 (L) 09/05/2018 0819   TIBC 312 07/17/2018 1124   FERRITIN 323 (H) 09/05/2018 0819   FERRITIN 60 07/17/2018 1124   IRONPCTSAT 10 (L) 09/05/2018 0819   IRONPCTSAT 4 (LL) 07/17/2018 1124     RADIOGRAPHIC STUDIES: I have personally reviewed the radiological images as listed and agreed with the findings in the report. 08/24/1999 nineteen 2D echo showed LVEF 55 to 60%. 08/30/2018 CXR Interval reduction/resolution of right-sided pleural effusion post thoracentesis. No  pneumothorax. ASSESSMENT & PLAN:  1. Endometrial adenocarcinoma ([place])   2. Recurrent pleural effusion on right   3. Lower extremity edema   4. Chemotherapy-induced peripheral neuropathy ([place])   5. Iron deficiency anemia due to chronic blood loss    #  FIGO grade 3 endometrial adenocarcinoma, clinically with extrauterine disease including intra-abdominal metastasis, Ascites, pleural effusion, and/or concomitant ovarian malignancies.   S/p 2 cycles of carboplatin and Taxol treatment.  Overall tolerates chemotherapy well. However CA125 after cycle 1 chemotherapy further increased to 875 Will discuss patient's case at [place] tumor board tomorrow, whether to proceed with cycle 3 treatment or surgery.   #Severe iron deficiency due to chronic blood loss, improved. Hemoglobin increased to 10.   #Recurrent pleural effusion, cytology negative x 3.  Fluid study meets lights criteria for exudate, LDH more than to start upper limit of mild serum LDH.  Flow cytometry negative.  2D echo revealed normal systolic function. S/p multiple thoracentesis.  She has one recent thoracentesis after cycle 2 treatment.   # Hydronephrosis, kidney function remained stable  # Anxiety, insomnia, continues Xanax 0.25 mg not helping with her sleep.  She takes Benadryl at night which is helpful.  Stable. ##Chronic lower extremity swelling, on Lasix as needed.  Not helpful with her symptoms.  I suspect her chronic lower extremity swelling likely secondary to anasarca or decreased venous return due to pelvic mass compression on IVC.  Continue to monitor.  # Family history ovarian cancer and personal history of endometrial CA.  Have referred her to genetic counselor.  We spent sufficient time to discuss many aspect of care, questions were answered to patient's satisfaction. The patient knows to call the clinic with any problems questions or concerns.  Return of visit: 2 weeks.   Total face to face encounter time for this  patient visit was 25 min. >50% of the time was  spent in counseling and coordination of care.  Earlie Server, MD, PhD Hematology Oncology [facility] at [facility] Pager- [phone] 09/11/2018

## 2018-09-12 ENCOUNTER — Telehealth: Payer: Self-pay

## 2018-09-12 NOTE — Telephone Encounter (Signed)
Contacted Karen Mann. Dr. Fransisca Connors would like to arrange to see her at [facility] to discuss proceeding now with surgery versus waiting until after cycle 3 of carbo/taxol. She is agreeable. We will arrange to have her seen 9/27 in his clinic at [facility] since her surgery will be arranged at [facility]. They will contact her with appointment. Oncology Nurse Navigator Documentation  Navigator Location: [facility] (09/12/18 1000)   )Navigator Encounter Type: Telephone (09/12/18 1000) Telephone: [place] Call (09/12/18 1000)                       Barriers/Navigation Needs: Coordination of Care (09/12/18 1000)   Interventions: Coordination of Care (09/12/18 1000)                      Time Spent with Patient: 15 (09/12/18 1000)

## 2018-09-13 ENCOUNTER — Encounter: Payer: 59 | Admitting: Family Medicine

## 2018-09-13 DIAGNOSIS — C541 Malignant neoplasm of endometrium: Secondary | ICD-10-CM | POA: Diagnosis not present

## 2018-09-15 ENCOUNTER — Other Ambulatory Visit: Payer: Self-pay | Admitting: Oncology

## 2018-09-19 ENCOUNTER — Other Ambulatory Visit: Payer: Self-pay

## 2018-09-19 ENCOUNTER — Encounter: Payer: Self-pay | Admitting: Oncology

## 2018-09-19 ENCOUNTER — Ambulatory Visit
Admission: RE | Admit: 2018-09-19 | Discharge: 2018-09-19 | Disposition: A | Discharge status: Home / Self Care | Payer: 59 | Source: Ambulatory Visit | Attending: Oncology | Admitting: Oncology

## 2018-09-19 ENCOUNTER — Inpatient Hospital Stay (HOSPITAL_BASED_OUTPATIENT_CLINIC_OR_DEPARTMENT_OTHER): Payer: 59 | Admitting: Oncology

## 2018-09-19 ENCOUNTER — Ambulatory Visit
Admission: RE | Admit: 2018-09-19 | Discharge: 2018-09-19 | Disposition: A | Discharge status: Home / Self Care | Payer: 59 | Source: Ambulatory Visit | Attending: Interventional Radiology | Admitting: Interventional Radiology

## 2018-09-19 VITALS — BP 124/83 | HR 85 | Temp 96.0°F | Resp 18 | Wt 230.0 lb

## 2018-09-19 DIAGNOSIS — D259 Leiomyoma of uterus, unspecified: Secondary | ICD-10-CM

## 2018-09-19 DIAGNOSIS — Z79899 Other long term (current) drug therapy: Secondary | ICD-10-CM

## 2018-09-19 DIAGNOSIS — J90 Pleural effusion, not elsewhere classified: Secondary | ICD-10-CM

## 2018-09-19 DIAGNOSIS — D500 Iron deficiency anemia secondary to blood loss (chronic): Secondary | ICD-10-CM

## 2018-09-19 DIAGNOSIS — R0602 Shortness of breath: Secondary | ICD-10-CM

## 2018-09-19 DIAGNOSIS — C541 Malignant neoplasm of endometrium: Secondary | ICD-10-CM | POA: Diagnosis not present

## 2018-09-19 DIAGNOSIS — R5383 Other fatigue: Secondary | ICD-10-CM

## 2018-09-19 DIAGNOSIS — Z9889 Other specified postprocedural states: Secondary | ICD-10-CM | POA: Insufficient documentation

## 2018-09-19 DIAGNOSIS — G4700 Insomnia, unspecified: Secondary | ICD-10-CM

## 2018-09-19 DIAGNOSIS — R188 Other ascites: Secondary | ICD-10-CM

## 2018-09-19 DIAGNOSIS — N1330 Unspecified hydronephrosis: Secondary | ICD-10-CM

## 2018-09-19 DIAGNOSIS — M7989 Other specified soft tissue disorders: Secondary | ICD-10-CM

## 2018-09-19 DIAGNOSIS — F419 Anxiety disorder, unspecified: Secondary | ICD-10-CM

## 2018-09-19 DIAGNOSIS — Z8041 Family history of malignant neoplasm of ovary: Secondary | ICD-10-CM

## 2018-09-19 NOTE — Procedures (Signed)
Pre procedural Dx: Symptomatic Pleural effusion Post procedural Dx: Same  Successful US guided right sided thoracentesis yielding 1.5  L of serous pleural fluid.   Samples sent to lab for analysis.  EBL: None  Complications: None immediate.  Jay Marcella Dunnaway, MD Pager #: [phone]   

## 2018-09-19 NOTE — Progress Notes (Signed)
Hematology/Oncology follow up  note [facility] Telephone:([phone]) [phone] [phone]   Patient Care Team: Virginia Crews, MD as PCP - General (Family Medicine) Clent Jacks, RN as Registered Nurse  REFERRING PROVIDER: Dr.Berchuck CHIEF COMPLAINTS/REASON FOR VISIT:  Evaluation of endometrial cancer.   HISTORY OF PRESENTING ILLNESS:  Karen Mann is a  57 y.o.  female with PMH listed below who was referred to me for evaluation of newly diagnosed endometrial cancer. Patient was recently referred to GYN Dr. Gilman Schmidt for abnormal uterine bleeding. 07/23/2018 ultrasound was performed showed large pelvic mass 20 x 13 x 22 cm with cystic and solid components.  Ultrasound also showed 4.9 cm fibroid and thickened endometrium 15 mm thickness 08/05/2018 MRI pelvis with and without contrast 20.6 cm complex cystic solid mass in the pelvis with numerous thickened enhancing septations, compatible with malignant ovarian neoplasm.  Neither ovary was discretely visualized.  Small volume of ascites.  Cross paratonia disease is not visualized but not excluded.  Left hydro-ureteronephrosis secondary to extrinsic compression. 5.3 intramural anterior uterine body fibroid.  #Endometrial biopsy was performed.  Pathology showed endometrioid adenocarcinoma consistent with FIGO grade 3.  8/12/19CA125 was elevated at 622 Patient was referred to see Dr. Fransisca Connors.  She was evaluated by Dr. Meredith Mody on 08/08/2018.  Staging CT was obtained. #Images independently reviewed by me.   Staging CT showed large cystic and a solid mass likely arising from the ovaries, measures 369 0 cc, suspicious for ovarian malignancy.  Moderate ascites noted.  Raising concern for malignant peritoneal spread.  No well-defined omental caking or solid tumor deposits along a situs., -Large right pleural effusion over half of right hemithorax volume. -Liver hypodensity 2.8 x 2.8 cm -Moderate left and mild right  hydronephrosis attributed to the ureteral compression due to mass. -Other chronic image findings.  MRI pelvis with and without contrast again showed 20.6 cm complex cystic/solid mass in the pelvis.  Compatible with malignant ovarian neoplasm.  Small volume of a situs.  Cross peritoneal disease is not visualized but cannot be excluded.  Left hydroureteronephrosis secondary to compression.  #Patient was scheduled to have a diagnostic and therapeutic thoracentesis this morning.  Cytology pending. She feels breathing is much better after the thoracentesis. Mild shortness of breath, worse with exertion.  Heart palpitation as well denies any chest pain, abdominal pain. Ongoing vaginal bleeding, she passed a big clot today. Feel extremely fatigued. She feels overwhelming and very nervous about the cancer diagnosis.  She has insomnia and she has tried melatonin which did not help with the sleep.  # Dr. Fransisca Connors recommends systemic therapy with carboplatin and paclitaxel 3 cycles, reassessment for surgical evaluation.per Dr.Berchuck, not candidate for Pembrolizumab and Levetinib trial.   # 08/10/2018 thoracentesis  with 1.2 L fluid removed.  Cytology negative for malignancy. 08/16/2018 status post thoracentesis again and removed 2.2 L of mildly bloody fluid.  I discussed with Dr. Dicie Beam and she reviewed the slides there was no malignancy cells. Pleural fluid LDH 246, albumin 2.5, protein 4.2 Serum LDH 270.  Protein level 6.4 08/22/2018, another thoracentesis both liters of fluid.  Cytology negative.  Patient's case was discussed on GYN tumor conference.  Dr. Theora Gianotti recommends NexGen sequence molecular testing.  And also check for PDL 1.    INTERVAL HISTORY Karen Mann is a 57 y.o. female called and reports she is feeling more shortness of breath and wants to be evaluated.   # SOB was quite at baseline until 2 days ago,  she starts to feel more SOB, especially with exertion.  # Vaginal bleeding has  improved. She has some spotting.    Review of Systems  Constitutional: Positive for malaise/fatigue. Negative for chills, fever and weight loss.  HENT: Negative for nosebleeds and sore throat.   Eyes: Negative for double vision, photophobia and redness.  Respiratory: Positive for shortness of breath. Negative for cough and wheezing.   Cardiovascular: Negative for chest pain, palpitations and orthopnea.  Gastrointestinal: Negative for abdominal pain, blood in stool, nausea and vomiting.  Genitourinary: Negative for dysuria.  Musculoskeletal: Negative for back pain, myalgias and neck pain.       Chronic back pain  Skin: Negative for itching and rash.  Neurological: Negative for dizziness, tingling and tremors.  Endo/Heme/Allergies: Negative for environmental allergies. Does not bruise/bleed easily.  Psychiatric/Behavioral: Negative for depression. The patient is not nervous/anxious.     MEDICAL HISTORY:  Past Medical History:  Diagnosis Date  . Allergy   . Anemia 07/20/2018  . Endometrial adenocarcinoma ([place]) 07/2018  . Fibroid uterus 07/24/2018   5cm/2 inch   . Ovarian mass 07/24/2018   22cm/10 inch  . Pelvic mass     SURGICAL HISTORY: Past Surgical History:  Procedure Laterality Date  . EYE SURGERY  2000   Lasik  . PORTA CATH INSERTION N/A 08/13/2018   Procedure: PORTA CATH INSERTION;  Surgeon: Algernon Huxley, MD;  Location: [facility];  Service: Cardiovascular;  Laterality: N/A;    SOCIAL HISTORY: Social History   Socioeconomic History  . Marital status: Married    Spouse name: Not on file  . Number of children: Not on file  . Years of education: Not on file  . Highest education level: Not on file  Occupational History  . Not on file  Social Needs  . Financial resource strain: Not on file  . Food insecurity:    Worry: Not on file    Inability: Not on file  . Transportation needs:    Medical: Not on file    Non-medical: Not on file  Tobacco Use  .  Smoking status: Never Smoker  . Smokeless tobacco: Never Used  Substance and Sexual Activity  . Alcohol use: No  . Drug use: No  . Sexual activity: Not Currently    Birth control/protection: Post-menopausal  Lifestyle  . Physical activity:    Days per week: Not on file    Minutes per session: Not on file  . Stress: Not on file  Relationships  . Social connections:    Talks on phone: Not on file    Gets together: Not on file    Attends religious service: Not on file    Active member of club or organization: Not on file    Attends meetings of clubs or organizations: Not on file    Relationship status: Not on file  . Intimate partner violence:    Fear of current or ex partner: Not on file    Emotionally abused: Not on file    Physically abused: Not on file    Forced sexual activity: Not on file  Other Topics Concern  . Not on file  Social History Narrative  . Not on file    FAMILY HISTORY: Family History  Problem Relation Age of Onset  . Hypertension Father   . Ovarian cancer Mother 62       deceased 58  . Healthy Brother   . Skin cancer Paternal Grandmother  deceased 70s  . Cancer Paternal Grandfather        unk. primary; deceased 53s  . Ovarian cancer Other        mat grandmother's sister    ALLERGIES:  is allergic to penicillins.  MEDICATIONS:  Current Outpatient Medications  Medication Sig Dispense Refill  . ALPRAZolam (XANAX) 0.25 MG tablet Take 1 tablet (0.25 mg total) by mouth at bedtime as needed for anxiety or sleep. 20 tablet 0  . dexamethasone (DECADRON) 4 MG tablet Take 2 tablets (8 mg total) by mouth See admin instructions. 2 days before and 2 days after chemotherapy 30 tablet 1  . gabapentin (NEURONTIN) 300 MG capsule Take 1 capsule (300 mg total) by mouth at bedtime. 30 capsule 0  . lidocaine-prilocaine (EMLA) cream Apply to affected area once 30 g 3  . ondansetron (ZOFRAN) 8 MG tablet Take 1 tablet (8 mg total) by mouth 2 (two) times daily as  needed for refractory nausea / vomiting. Start on day 3 after chemo. 30 tablet 1  . oxyCODONE (ROXICODONE) 5 MG immediate release tablet Take 1 tablet (5 mg total) by mouth every 6 (six) hours as needed for moderate pain, severe pain or breakthrough pain. 10 tablet 0  . Polyethylene Glycol 3350 (MIRALAX PO) Take by mouth daily.     . prochlorperazine (COMPAZINE) 10 MG tablet Take 1 tablet (10 mg total) by mouth every 6 (six) hours as needed (Nausea or vomiting). 30 tablet 1  . furosemide (LASIX) 20 MG tablet Take 1 tablet (20 mg total) by mouth daily as needed for fluid. (Patient not taking: Reported on 09/05/2018) 30 tablet 0  . potassium chloride SA (K-DUR,KLOR-CON) 20 MEQ tablet Take 1 tablet (20 mEq total) by mouth daily as needed (take on the days you take Lasix.). (Patient not taking: Reported on 09/05/2018) 60 tablet 0  . rOPINIRole (REQUIP) 0.25 MG tablet Take 1 tablet (0.25 mg total) by mouth at bedtime. Take 1-3 hours before bedtime (Patient not taking: Reported on 09/19/2018) 30 tablet 3   No current facility-administered medications for this visit.      PHYSICAL EXAMINATION: ECOG PERFORMANCE STATUS: 1 - Symptomatic but completely ambulatory Vitals:   09/19/18 1141  BP: 124/83  Pulse: 85  Resp: 18  Temp: (!) 96 F (35.6 C)  SpO2: 96%   Filed Weights   09/19/18 1141  Weight: 230 lb (104.3 kg)    Physical Exam  Constitutional: She is oriented to person, place, and time. No distress.  HENT:  Head: Normocephalic and atraumatic.  Mouth/Throat: Oropharynx is clear and moist.  Eyes: Pupils are equal, round, and reactive to light. EOM are normal. No scleral icterus.  Pale conjunctivae  Neck: Normal range of motion. Neck supple.  Cardiovascular: Normal rate, regular rhythm and normal heart sounds.  Pulmonary/Chest: Effort normal. No respiratory distress. She has no wheezes.  absent breath sound at right base, 1/4 of the right lung.   Abdominal: Soft. Bowel sounds are normal. She  exhibits mass. She exhibits no distension. There is no tenderness.  Palpable lower abdominal/pelvis mass  Musculoskeletal: Normal range of motion. She exhibits edema. She exhibits no deformity.  Bilateral 2+ pitting edema  Neurological: She is alert and oriented to person, place, and time. No cranial nerve deficit. Coordination normal.  Skin: Skin is warm and dry. No rash noted. No erythema.  Psychiatric: She has a normal mood and affect. Her behavior is normal. Thought content normal.     LABORATORY DATA:  I have  reviewed the data as listed Lab Results  Component Value Date   WBC 19.5 (H) 09/11/2018   HGB 10.0 (L) 09/11/2018   HCT 31.7 (L) 09/11/2018   MCV 78.8 (L) 09/11/2018   PLT 153 09/11/2018   Recent Labs    08/29/18 1329 09/05/18 0819 09/11/18 1050  NA 136 137 137  K 4.0 3.9 4.0  CL 104 105 104  CO2 22 23 24   GLUCOSE 103* 98 101*  BUN 11 11 13   CREATININE 0.91 0.76 0.74  CALCIUM 8.7* 8.7* 8.8*  GFRNONAA >60 >60 >60  GFRAA >60 >60 >60  PROT 7.0 6.7 6.4*  ALBUMIN 3.2* 3.1* 3.4*  AST 17 14* 18  ALT 12 7 11   ALKPHOS 97 75 103  BILITOT 0.8 0.7 0.7   Iron/TIBC/Ferritin/ %Sat    Component Value Date/Time   IRON 25 (L) 09/05/2018 0819   IRON 11 (L) 07/17/2018 1124   TIBC 245 (L) 09/05/2018 0819   TIBC 312 07/17/2018 1124   FERRITIN 323 (H) 09/05/2018 0819   FERRITIN 60 07/17/2018 1124   IRONPCTSAT 10 (L) 09/05/2018 0819   IRONPCTSAT 4 (LL) 07/17/2018 1124     RADIOGRAPHIC STUDIES: I have personally reviewed the radiological images as listed and agreed with the findings in the report. 08/24/1999 nineteen 2D echo showed LVEF 55 to 60%. 08/30/2018 CXR Interval reduction/resolution of right-sided pleural effusion post thoracentesis. No pneumothorax. ASSESSMENT & PLAN:  1. Pleural effusion   2. Endometrial adenocarcinoma ([place])   3. SOB (shortness of breath)    # SOB. Suspect patient has recurrent right pleural effusion. Will repeat US guided thoracentesis.    Called by Ultrasound. Patient had repeat thoracentesis and had 1.5L removed.  Radiology is concerned that fluid may re-accumulate next week prior to surgery.  Plan another attempt of thoracentesis on the day prior to her surgery, hoping to improve her respiratory condition before surgery.   #  FIGO grade 3 endometrial adenocarcinoma, clinically with extrauterine disease including intra-abdominal metastasis, Ascites, pleural effusion, and/or concomitant ovarian malignancies.   S/p 2 cycles of carboplatin and Taxol treatment.  Overall tolerates chemotherapy well. Rising CA125 after one cycle of chemotherapy.  I have discussed with Dr.Berchuk. Patient will have surgery done for clarification of pathology and debulking.   # Family history ovarian cancer and personal history of endometrial CA.  Have referred her to genetic counselor.  We spent sufficient time to discuss many aspect of care, questions were answered to patient's satisfaction. The patient knows to call the clinic with any problems questions or concerns.  Return of visit: to be determined.   Earlie Server, MD, PhD Hematology Oncology [facility] Pager- [phone] 09/19/2018

## 2018-09-19 NOTE — Progress Notes (Signed)
Patient here with complaints of shortness of breath, dry cough. She thinks it might be fluid in her lungs.

## 2018-09-21 ENCOUNTER — Encounter (HOSPITAL_COMMUNITY): Payer: Self-pay | Admitting: Oncology

## 2018-09-21 DIAGNOSIS — J90 Pleural effusion, not elsewhere classified: Secondary | ICD-10-CM | POA: Diagnosis not present

## 2018-09-21 DIAGNOSIS — J811 Chronic pulmonary edema: Secondary | ICD-10-CM | POA: Diagnosis not present

## 2018-09-21 DIAGNOSIS — R0602 Shortness of breath: Secondary | ICD-10-CM | POA: Diagnosis not present

## 2018-09-21 DIAGNOSIS — R071 Chest pain on breathing: Secondary | ICD-10-CM | POA: Diagnosis not present

## 2018-09-21 DIAGNOSIS — J9811 Atelectasis: Secondary | ICD-10-CM | POA: Diagnosis not present

## 2018-09-21 DIAGNOSIS — I2699 Other pulmonary embolism without acute cor pulmonale: Secondary | ICD-10-CM | POA: Diagnosis not present

## 2018-09-21 DIAGNOSIS — Z01818 Encounter for other preprocedural examination: Secondary | ICD-10-CM | POA: Diagnosis not present

## 2018-09-21 DIAGNOSIS — N1330 Unspecified hydronephrosis: Secondary | ICD-10-CM | POA: Diagnosis not present

## 2018-09-21 LAB — CYTOLOGY - NON PAP

## 2018-09-22 DIAGNOSIS — J9811 Atelectasis: Secondary | ICD-10-CM | POA: Diagnosis not present

## 2018-09-22 DIAGNOSIS — R0602 Shortness of breath: Secondary | ICD-10-CM | POA: Diagnosis not present

## 2018-09-22 DIAGNOSIS — J811 Chronic pulmonary edema: Secondary | ICD-10-CM | POA: Diagnosis not present

## 2018-09-22 DIAGNOSIS — R071 Chest pain on breathing: Secondary | ICD-10-CM | POA: Diagnosis not present

## 2018-09-22 DIAGNOSIS — J90 Pleural effusion, not elsewhere classified: Secondary | ICD-10-CM | POA: Diagnosis not present

## 2018-09-22 DIAGNOSIS — I2699 Other pulmonary embolism without acute cor pulmonale: Secondary | ICD-10-CM | POA: Insufficient documentation

## 2018-09-22 DIAGNOSIS — I82412 Acute embolism and thrombosis of left femoral vein: Secondary | ICD-10-CM | POA: Diagnosis not present

## 2018-09-22 DIAGNOSIS — I50811 Acute right heart failure: Secondary | ICD-10-CM | POA: Diagnosis not present

## 2018-09-22 DIAGNOSIS — C541 Malignant neoplasm of endometrium: Secondary | ICD-10-CM | POA: Diagnosis not present

## 2018-09-22 DIAGNOSIS — I82409 Acute embolism and thrombosis of unspecified deep veins of unspecified lower extremity: Secondary | ICD-10-CM | POA: Insufficient documentation

## 2018-09-22 DIAGNOSIS — I82431 Acute embolism and thrombosis of right popliteal vein: Secondary | ICD-10-CM | POA: Diagnosis not present

## 2018-09-22 DIAGNOSIS — N1330 Unspecified hydronephrosis: Secondary | ICD-10-CM | POA: Diagnosis not present

## 2018-09-24 ENCOUNTER — Ambulatory Visit: Admission: RE | Admit: 2018-09-24 | Payer: 59 | Source: Ambulatory Visit

## 2018-09-24 ENCOUNTER — Telehealth: Payer: Self-pay

## 2018-09-24 DIAGNOSIS — C541 Malignant neoplasm of endometrium: Secondary | ICD-10-CM | POA: Diagnosis not present

## 2018-09-24 MED ORDER — HEPARIN (PORCINE) IN NACL 100-0.45 UNIT/ML-% IJ SOLN
2400.00 | INTRAMUSCULAR | Status: DC
Start: ? — End: 2018-09-24

## 2018-09-24 MED ORDER — ALPRAZOLAM 0.25 MG PO TABS
0.25 | ORAL_TABLET | ORAL | Status: DC
Start: ? — End: 2018-09-24

## 2018-09-24 MED ORDER — ONDANSETRON HCL 4 MG/2ML IJ SOLN
4.00 | INTRAMUSCULAR | Status: DC
Start: ? — End: 2018-09-24

## 2018-09-24 MED ORDER — ACETAMINOPHEN 325 MG PO TABS
650.00 | ORAL_TABLET | ORAL | Status: DC
Start: ? — End: 2018-09-24

## 2018-09-24 MED ORDER — LIDOCAINE HCL (PF) 1 % IJ SOLN
0.50 | INTRAMUSCULAR | Status: DC
Start: ? — End: 2018-09-24

## 2018-09-24 NOTE — Telephone Encounter (Signed)
Received call from Mrs. Oh as well as email from Dr. Shon Baton at [facility]. Updated on plan of care. She is likely to be discharged from [facility] today or tomorrow. Per Mrs. Rames she will not be able to have surgery for a month due to PE's and Dr. Fransisca Connors would like her to proceed with chemotherapy 10/2. Email from Dr. Shon Baton confirms. Dr. Tasia Catchings notified and request made to have appointments for 10/2 rescheduled. Oncology Nurse Navigator Documentation  Navigator Location: [facility] (09/24/18 1600)   )Navigator Encounter Type: Telephone (09/24/18 1600) Telephone: Incoming Call (09/24/18 1600)                       Barriers/Navigation Needs: Coordination of Care (09/24/18 1600)   Interventions: Coordination of Care (09/24/18 1600)                      Time Spent with Patient: 15 (09/24/18 1600)

## 2018-09-26 ENCOUNTER — Inpatient Hospital Stay: Payer: 59 | Attending: Oncology

## 2018-09-26 ENCOUNTER — Other Ambulatory Visit: Payer: Self-pay

## 2018-09-26 ENCOUNTER — Encounter: Payer: Self-pay | Admitting: Oncology

## 2018-09-26 ENCOUNTER — Inpatient Hospital Stay: Payer: 59 | Admitting: Oncology

## 2018-09-26 ENCOUNTER — Inpatient Hospital Stay: Payer: 59

## 2018-09-26 ENCOUNTER — Other Ambulatory Visit: Payer: Self-pay | Admitting: Oncology

## 2018-09-26 ENCOUNTER — Inpatient Hospital Stay (HOSPITAL_BASED_OUTPATIENT_CLINIC_OR_DEPARTMENT_OTHER): Payer: 59 | Admitting: Oncology

## 2018-09-26 VITALS — BP 116/85 | HR 87 | Temp 96.5°F | Resp 18 | Wt 224.9 lb

## 2018-09-26 VITALS — BP 123/83 | HR 102 | Temp 96.6°F | Resp 18

## 2018-09-26 DIAGNOSIS — C772 Secondary and unspecified malignant neoplasm of intra-abdominal lymph nodes: Secondary | ICD-10-CM | POA: Diagnosis not present

## 2018-09-26 DIAGNOSIS — Z7901 Long term (current) use of anticoagulants: Secondary | ICD-10-CM | POA: Insufficient documentation

## 2018-09-26 DIAGNOSIS — R188 Other ascites: Secondary | ICD-10-CM | POA: Insufficient documentation

## 2018-09-26 DIAGNOSIS — I2699 Other pulmonary embolism without acute cor pulmonale: Secondary | ICD-10-CM | POA: Insufficient documentation

## 2018-09-26 DIAGNOSIS — J90 Pleural effusion, not elsewhere classified: Secondary | ICD-10-CM

## 2018-09-26 DIAGNOSIS — Z7689 Persons encountering health services in other specified circumstances: Secondary | ICD-10-CM | POA: Diagnosis not present

## 2018-09-26 DIAGNOSIS — R0602 Shortness of breath: Secondary | ICD-10-CM | POA: Diagnosis not present

## 2018-09-26 DIAGNOSIS — D500 Iron deficiency anemia secondary to blood loss (chronic): Secondary | ICD-10-CM | POA: Insufficient documentation

## 2018-09-26 DIAGNOSIS — Z79899 Other long term (current) drug therapy: Secondary | ICD-10-CM | POA: Diagnosis not present

## 2018-09-26 DIAGNOSIS — C541 Malignant neoplasm of endometrium: Secondary | ICD-10-CM | POA: Diagnosis present

## 2018-09-26 DIAGNOSIS — Z5111 Encounter for antineoplastic chemotherapy: Secondary | ICD-10-CM | POA: Diagnosis not present

## 2018-09-26 DIAGNOSIS — I824Y3 Acute embolism and thrombosis of unspecified deep veins of proximal lower extremity, bilateral: Secondary | ICD-10-CM | POA: Diagnosis not present

## 2018-09-26 DIAGNOSIS — Z95828 Presence of other vascular implants and grafts: Secondary | ICD-10-CM | POA: Insufficient documentation

## 2018-09-26 LAB — COMPREHENSIVE METABOLIC PANEL
ALBUMIN: 3.4 g/dL — AB (ref 3.5–5.0)
ALT: 8 U/L (ref 0–44)
AST: 16 U/L (ref 15–41)
Alkaline Phosphatase: 80 U/L (ref 38–126)
Anion gap: 9 (ref 5–15)
BUN: 10 mg/dL (ref 6–20)
CHLORIDE: 108 mmol/L (ref 98–111)
CO2: 24 mmol/L (ref 22–32)
Calcium: 9.3 mg/dL (ref 8.9–10.3)
Creatinine, Ser: 0.82 mg/dL (ref 0.44–1.00)
GFR calc Af Amer: >60 mL/min (ref >60)
GFR calc non Af Amer: >60 mL/min (ref >60)
GLUCOSE: 98 mg/dL (ref 70–99)
Potassium: 3.7 mmol/L (ref 3.5–5.1)
SODIUM: 141 mmol/L (ref 135–145)
Total Bilirubin: 0.4 mg/dL (ref 0.3–1.2)
Total Protein: 6.9 g/dL (ref 6.5–8.1)

## 2018-09-26 LAB — CBC WITH DIFFERENTIAL/PLATELET
Basophils Absolute: 0.1 10*3/uL (ref 0–0.1)
Basophils Relative: 1 %
EOS ABS: 0 10*3/uL (ref 0–0.7)
EOS PCT: 0 %
HCT: 32.8 % — ABNORMAL LOW (ref 35.0–47.0)
HEMOGLOBIN: 10.5 g/dL — AB (ref 12.0–16.0)
LYMPHS ABS: 2.3 10*3/uL (ref 1.0–3.6)
Lymphocytes Relative: 20 %
MCH: 27 pg (ref 26.0–34.0)
MCHC: 32 g/dL (ref 32.0–36.0)
MCV: 84.3 fL (ref 80.0–100.0)
MONOS PCT: 6 %
Monocytes Absolute: 0.7 10*3/uL (ref 0.2–0.9)
NEUTROS PCT: 73 %
Neutro Abs: 8.2 10*3/uL — ABNORMAL HIGH (ref 1.4–6.5)
PLATELETS: 255 10*3/uL (ref 150–440)
RBC: 3.89 MIL/uL (ref 3.80–5.20)
RDW: 28.6 % — AB (ref 11.5–14.5)
WBC: 11.2 10*3/uL — ABNORMAL HIGH (ref 3.6–11.0)

## 2018-09-26 MED ORDER — ENOXAPARIN SODIUM 100 MG/ML ~~LOC~~ SOLN
100.00 | SUBCUTANEOUS | Status: DC
Start: 2018-09-25 — End: 2018-09-26

## 2018-09-26 MED ORDER — HEPARIN SOD (PORK) LOCK FLUSH 100 UNIT/ML IV SOLN: 500.0000 [IU] | Freq: Once | INTRAVENOUS | Status: DC | PRN

## 2018-09-26 MED ORDER — SODIUM CHLORIDE 0.9 % IV SOLN
Freq: Once | INTRAVENOUS | Status: DC
  Filled 2018-09-26: qty 5

## 2018-09-26 MED ORDER — DEXAMETHASONE 4 MG PO TABS
8.00 | ORAL_TABLET | ORAL | Status: DC
Start: 2018-09-26 — End: 2018-09-26

## 2018-09-26 MED ORDER — SODIUM CHLORIDE 0.9 % IV SOLN
20.0000 mg | Freq: Once | INTRAVENOUS | Status: AC
  Administered 2018-09-26: 20 mg via INTRAVENOUS
  Filled 2018-09-26: qty 2

## 2018-09-26 MED ORDER — SODIUM CHLORIDE 0.9% FLUSH
10.0000 mL | INTRAVENOUS | Status: DC | PRN
  Administered 2018-09-26: 10 mL via INTRAVENOUS
  Filled 2018-09-26: qty 10

## 2018-09-26 MED ORDER — SODIUM CHLORIDE 0.9 % IV SOLN
20.0000 mg | Freq: Once | INTRAVENOUS | Status: DC
  Filled 2018-09-26: qty 2

## 2018-09-26 MED ORDER — SODIUM CHLORIDE 0.9 % IV SOLN
Freq: Once | INTRAVENOUS | Status: AC
  Administered 2018-09-26: 10:00:00 via INTRAVENOUS
  Filled 2018-09-26: qty 250

## 2018-09-26 MED ORDER — SODIUM CHLORIDE 0.9 % IV SOLN
Freq: Once | INTRAVENOUS | Status: AC
  Administered 2018-09-26: 11:00:00 via INTRAVENOUS
  Filled 2018-09-26: qty 150

## 2018-09-26 MED ORDER — DIPHENHYDRAMINE HCL 50 MG/ML IJ SOLN
50.0000 mg | Freq: Once | INTRAMUSCULAR | Status: AC
  Administered 2018-09-26: 50 mg via INTRAVENOUS
  Filled 2018-09-26: qty 1

## 2018-09-26 MED ORDER — SODIUM CHLORIDE 0.9 % IV SOLN
175.0000 mg/m2 | Freq: Once | INTRAVENOUS | Status: AC
  Administered 2018-09-26: 402 mg via INTRAVENOUS
  Filled 2018-09-26: qty 67

## 2018-09-26 MED ORDER — PALONOSETRON HCL INJECTION 0.25 MG/5ML
0.2500 mg | Freq: Once | INTRAVENOUS | Status: AC
  Administered 2018-09-26: 0.25 mg via INTRAVENOUS
  Filled 2018-09-26: qty 5

## 2018-09-26 MED ORDER — HEPARIN SOD (PORK) LOCK FLUSH 100 UNIT/ML IV SOLN
500.0000 [IU] | Freq: Once | INTRAVENOUS | Status: AC
  Administered 2018-09-26: 500 [IU] via INTRAVENOUS
  Filled 2018-09-26: qty 5

## 2018-09-26 MED ORDER — MAGNESIUM OXIDE 400 MG PO TABS
400.00 | ORAL_TABLET | ORAL | Status: DC
Start: 2018-09-25 — End: 2018-09-26

## 2018-09-26 MED ORDER — FAMOTIDINE IN NACL 20-0.9 MG/50ML-% IV SOLN
20.0000 mg | Freq: Once | INTRAVENOUS | Status: AC
  Administered 2018-09-26: 20 mg via INTRAVENOUS
  Filled 2018-09-26: qty 50

## 2018-09-26 MED ORDER — SODIUM CHLORIDE 0.9 % IV SOLN
900.0000 mg | Freq: Once | INTRAVENOUS | Status: AC
  Administered 2018-09-26: 900 mg via INTRAVENOUS
  Filled 2018-09-26: qty 90

## 2018-09-26 NOTE — Progress Notes (Signed)
Hematology/Oncology follow up  note [facility] [phone] [phone]) [phone]   Patient Care Team: Virginia Crews, MD as PCP - General (Family Medicine) Clent Jacks, RN as Registered Nurse  REFERRING PROVIDER: Dr.Berchuck CHIEF COMPLAINTS/REASON FOR VISIT:  Evaluation of endometrial cancer.   HISTORY OF PRESENTING ILLNESS:  Karen Mann is a  57 y.o.  female with PMH listed below who was referred to me for evaluation of newly diagnosed endometrial cancer. Patient was recently referred to GYN Dr. Gilman Schmidt for abnormal uterine bleeding. 07/23/2018 ultrasound was performed showed large pelvic mass 20 x 13 x 22 cm with cystic and solid components.  Ultrasound also showed 4.9 cm fibroid and thickened endometrium 15 mm thickness 08/05/2018 MRI pelvis with and without contrast 20.6 cm complex cystic solid mass in the pelvis with numerous thickened enhancing septations, compatible with malignant ovarian neoplasm.  Neither ovary was discretely visualized.  Small volume of ascites.  Cross paratonia disease is not visualized but not excluded.  Left hydro-ureteronephrosis secondary to extrinsic compression. 5.3 intramural anterior uterine body fibroid.  #Endometrial biopsy was performed.  Pathology showed endometrioid adenocarcinoma consistent with FIGO grade 3.  8/12/19CA125 was elevated at 622 Patient was referred to see Dr. Fransisca Connors.  She was evaluated by Dr. Meredith Mody on 08/08/2018.  Staging CT was obtained. #Images independently reviewed by me.   Staging CT showed large cystic and a solid mass likely arising from the ovaries, measures 369 0 cc, suspicious for ovarian malignancy.  Moderate ascites noted.  Raising concern for malignant peritoneal spread.  No well-defined omental caking or solid tumor deposits along a situs., -Large right pleural effusion over half of right hemithorax volume. -Liver hypodensity 2.8 x 2.8 cm -Moderate left and mild right  hydronephrosis attributed to the ureteral compression due to mass. -Other chronic image findings.  MRI pelvis with and without contrast again showed 20.6 cm complex cystic/solid mass in the pelvis.  Compatible with malignant ovarian neoplasm.  Small volume of a situs.  Cross peritoneal disease is not visualized but cannot be excluded.  Left hydroureteronephrosis secondary to compression.  #Patient was scheduled to have a diagnostic and therapeutic thoracentesis this morning.  Cytology pending. She feels breathing is much better after the thoracentesis. Mild shortness of breath, worse with exertion.  Heart palpitation as well denies any chest pain, abdominal pain. Ongoing vaginal bleeding, she passed a big clot today. Feel extremely fatigued. She feels overwhelming and very nervous about the cancer diagnosis.  She has insomnia and she has tried melatonin which did not help with the sleep.  # Dr. Fransisca Connors recommends systemic therapy with carboplatin and paclitaxel 3 cycles, reassessment for surgical evaluation.per Dr.Berchuck, not candidate for Pembrolizumab and Levetinib trial.   # 08/10/2018 thoracentesis  with 1.2 L fluid removed.  Cytology negative for malignancy. 08/16/2018 status post thoracentesis again and removed 2.2 L of mildly bloody fluid.  I discussed with Dr. Dicie Beam and she reviewed the slides there was no malignancy cells. Pleural fluid LDH 246, albumin 2.5, protein 4.2 Serum LDH 270.  Protein level 6.4 08/22/2018, another thoracentesis both liters of fluid.  Cytology negative.  Patient's case was discussed on GYN tumor conference.  Dr. Theora Gianotti recommends NexGen sequence molecular testing.  And also check for PDL 1.    INTERVAL HISTORY Karen Mann is a 57 y.o. female with above history reviewed by me today presents for assessment prior to chemotherapy. She was originally planned to proceed with surgery, however was found to be hypoxic  with oxygen saturation in the low 80s at Dr.  Blake Divine office. CT PE study showed bilateral pulmonary embolism.  Lower extremity venous Doppler showed bilateral DVT with right heart strain. Patient was admitted at [facility] and started on heparin drip.  Discharged on Lovenox 100 mg subcutaneous twice daily. Surgery was delayed due to PE and DVT. Patient presents for assessment for cycle 3 neoadjuvant chemotherapy with carboplatin and Taxol.  #Shortness of breath, reports that her shortness of breath has not improved since starting anticoagulation.  Denies any chest pain.  Shortness of breath with worsened with exertion. #Bilateral lower extremity edema improved   Review of Systems  Constitutional: Positive for malaise/fatigue. Negative for chills, fever and weight loss.  HENT: Negative for nosebleeds and sore throat.   Eyes: Negative for double vision, photophobia and redness.  Respiratory: Positive for shortness of breath. Negative for cough and wheezing.   Cardiovascular: Positive for leg swelling. Negative for chest pain, palpitations and orthopnea.  Gastrointestinal: Negative for abdominal pain, blood in stool, nausea and vomiting.  Genitourinary: Negative for dysuria.  Musculoskeletal: Negative for back pain, myalgias and neck pain.       Chronic back pain  Skin: Negative for itching and rash.  Neurological: Negative for dizziness, tingling and tremors.  Endo/Heme/Allergies: Negative for environmental allergies. Does not bruise/bleed easily.  Psychiatric/Behavioral: Negative for depression. The patient is not nervous/anxious.     MEDICAL HISTORY:  Past Medical History:  Diagnosis Date  . Allergy   . Anemia 07/20/2018  . Endometrial adenocarcinoma ([place]) 07/2018  . Fibroid uterus 07/24/2018   5cm/2 inch   . Ovarian mass 07/24/2018   22cm/10 inch  . Pelvic mass     SURGICAL HISTORY: Past Surgical History:  Procedure Laterality Date  . EYE SURGERY  2000   Lasik  . PORTA CATH INSERTION N/A 08/13/2018   Procedure: PORTA  CATH INSERTION;  Surgeon: Algernon Huxley, MD;  Location: [facility];  Service: Cardiovascular;  Laterality: N/A;    SOCIAL HISTORY: Social History   Socioeconomic History  . Marital status: Married    Spouse name: Not on file  . Number of children: Not on file  . Years of education: Not on file  . Highest education level: Not on file  Occupational History  . Not on file  Social Needs  . Financial resource strain: Not on file  . Food insecurity:    Worry: Not on file    Inability: Not on file  . Transportation needs:    Medical: Not on file    Non-medical: Not on file  Tobacco Use  . Smoking status: Never Smoker  . Smokeless tobacco: Never Used  Substance and Sexual Activity  . Alcohol use: No  . Drug use: No  . Sexual activity: Not Currently    Birth control/protection: Post-menopausal  Lifestyle  . Physical activity:    Days per week: Not on file    Minutes per session: Not on file  . Stress: Not on file  Relationships  . Social connections:    Talks on phone: Not on file    Gets together: Not on file    Attends religious service: Not on file    Active member of club or organization: Not on file    Attends meetings of clubs or organizations: Not on file    Relationship status: Not on file  . Intimate partner violence:    Fear of current or ex partner: Not on file  Emotionally abused: Not on file    Physically abused: Not on file    Forced sexual activity: Not on file  Other Topics Concern  . Not on file  Social History Narrative  . Not on file    FAMILY HISTORY: Family History  Problem Relation Age of Onset  . Hypertension Father   . Ovarian cancer Mother 31       deceased 68  . Healthy Brother   . Skin cancer Paternal Grandmother        deceased 71s  . Cancer Paternal Grandfather        unk. primary; deceased 33s  . Ovarian cancer Other        mat grandmother's sister    ALLERGIES:  is allergic to penicillins.  MEDICATIONS:  Current  Outpatient Medications  Medication Sig Dispense Refill  . acetaminophen (TYLENOL) 325 MG tablet Take by mouth. Take 650 mg by mouth every 4 (four) hours as needed for Pain    . ALPRAZolam (XANAX) 0.25 MG tablet Take 1 tablet (0.25 mg total) by mouth at bedtime as needed for anxiety or sleep. 20 tablet 0  . dexamethasone (DECADRON) 4 MG tablet Take 2 tablets (8 mg total) by mouth See admin instructions. 2 days before and 2 days after chemotherapy 30 tablet 1  . docusate sodium (COLACE) 50 MG capsule Take by mouth. Take 100 mg by mouth 2 (two) times daily    . enoxaparin (LOVENOX) 100 MG/ML injection Inject into the skin. Inject 1 mL (100 mg total) subcutaneously every 12 (twelve) hours    . gabapentin (NEURONTIN) 300 MG capsule Take 1 capsule (300 mg total) by mouth at bedtime. 30 capsule 0  . lidocaine-prilocaine (EMLA) cream Apply to affected area once 30 g 3  . ondansetron (ZOFRAN) 8 MG tablet Take 1 tablet (8 mg total) by mouth 2 (two) times daily as needed for refractory nausea / vomiting. Start on day 3 after chemo. 30 tablet 1  . oxyCODONE (ROXICODONE) 5 MG immediate release tablet Take 1 tablet (5 mg total) by mouth every 6 (six) hours as needed for moderate pain, severe pain or breakthrough pain. 10 tablet 0  . Polyethylene Glycol 3350 (MIRALAX PO) Take by mouth daily.     . prochlorperazine (COMPAZINE) 10 MG tablet Take 1 tablet (10 mg total) by mouth every 6 (six) hours as needed (Nausea or vomiting). 30 tablet 1  . furosemide (LASIX) 20 MG tablet Take 1 tablet (20 mg total) by mouth daily as needed for fluid. (Patient not taking: Reported on 09/05/2018) 30 tablet 0  . potassium chloride SA (K-DUR,KLOR-CON) 20 MEQ tablet Take 1 tablet (20 mEq total) by mouth daily as needed (take on the days you take Lasix.). (Patient not taking: Reported on 09/05/2018) 60 tablet 0  . rOPINIRole (REQUIP) 0.25 MG tablet Take 1 tablet (0.25 mg total) by mouth at bedtime. Take 1-3 hours before bedtime (Patient  not taking: Reported on 09/26/2018) 30 tablet 3   No current facility-administered medications for this visit.      PHYSICAL EXAMINATION: ECOG PERFORMANCE STATUS: 1 - Symptomatic but completely ambulatory Vitals:   09/26/18 0849 09/26/18 0858  BP: 116/85   Pulse: 87   Resp: 18   Temp: (!) 96.5 F (35.8 C)   SpO2:  95%   Filed Weights   09/26/18 0849  Weight: 224 lb 14.4 oz (102 kg)    Physical Exam  Constitutional: She is oriented to person, place, and time. No distress.  HENT:  Head: Normocephalic and atraumatic.  Mouth/Throat: Oropharynx is clear and moist.  Eyes: Pupils are equal, round, and reactive to light. EOM are normal. No scleral icterus.  Pale conjunctivae  Neck: Normal range of motion. Neck supple.  Cardiovascular: Normal rate, regular rhythm and normal heart sounds.  Pulmonary/Chest: Effort normal. No respiratory distress. She has no wheezes.  absent breath sound at right base, 1/4 of the right lung.   Abdominal: Soft. Bowel sounds are normal. She exhibits mass. She exhibits no distension. There is no tenderness.  Palpable lower abdominal/pelvis mass  Musculoskeletal: Normal range of motion. She exhibits edema. She exhibits no deformity.  Bilateral 1+ pitting edema  Neurological: She is alert and oriented to person, place, and time. No cranial nerve deficit. Coordination normal.  Skin: Skin is warm and dry. No rash noted. No erythema.  Psychiatric: She has a normal mood and affect. Her behavior is normal. Thought content normal.     LABORATORY DATA:  I have reviewed the data as listed Lab Results  Component Value Date   WBC 11.2 (H) 09/26/2018   HGB 10.5 (L) 09/26/2018   HCT 32.8 (L) 09/26/2018   MCV 84.3 09/26/2018   PLT 255 09/26/2018   Recent Labs    09/05/18 0819 09/11/18 1050 09/26/18 0822  NA 137 137 141  K 3.9 4.0 3.7  CL 105 104 108  CO2 23 24 24   GLUCOSE 98 101* 98  BUN 11 13 10   CREATININE 0.76 0.74 0.82  CALCIUM 8.7* 8.8* 9.3    GFRNONAA >60 >60 >60  GFRAA >60 >60 >60  PROT 6.7 6.4* 6.9  ALBUMIN 3.1* 3.4* 3.4*  AST 14* 18 16  ALT 7 11 8   ALKPHOS 75 103 80  BILITOT 0.7 0.7 0.4   Iron/TIBC/Ferritin/ %Sat    Component Value Date/Time   IRON 25 (L) 09/05/2018 0819   IRON 11 (L) 07/17/2018 1124   TIBC 245 (L) 09/05/2018 0819   TIBC 312 07/17/2018 1124   FERRITIN 323 (H) 09/05/2018 0819   FERRITIN 60 07/17/2018 1124   IRONPCTSAT 10 (L) 09/05/2018 0819   IRONPCTSAT 4 (LL) 07/17/2018 1124    RADIOGRAPHIC STUDIES: I have personally reviewed the radiological images as listed and agreed with the findings in the report. 08/24/1999 nineteen 2D echo showed LVEF 55 to 60%. 08/30/2018 CXR Interval reduction/resolution of right-sided pleural effusion post thoracentesis. No pneumothorax.  09/23/2018, [place] lower extremity venous bilaterally positive for bilateral DVT CT chest PE protocol showed bilateral pulmonary emboli, most centrally in the bilateral main pulmonary arteries.  There are associated with signs suggestive of right heart strain.  No evidence of pulmonary infarct.  Moderate right pleural effusion.  Partially visualized left hydronephrosis also present on prior CT.   ASSESSMENT & PLAN:  1. Endometrial adenocarcinoma ([place])   2. Recurrent pleural effusion on right   3. SOB (shortness of breath)   4. Iron deficiency anemia due to chronic blood loss   5. Encounter for antineoplastic chemotherapy   6. Bilateral pulmonary embolism ([place])   7. Acute deep vein thrombosis (DVT) of proximal vein of both lower extremities (H[facility])    #  FIGO grade 3 endometrial adenocarcinoma, clinically with extrauterine disease including intra-abdominal metastasis, Ascites, pleural effusion, and/or concomitant ovarian malignancies.   Status post 2 neoadjuvant carboplatin and Taxol treatment.  CA 125 has been trending up. She was supposed to have debulking surgery however plan was delayed due to acute PE and DVT bilaterally. Discussed  with Dr. Fransisca Connors,  patient will need to be reevaluated after being on anticoagulation for at least 4 months. For now continue and proceed with cycle 3 carboplatin and Taxol. Labs reviewed and discussed with patient.  Counts are acceptable to proceed with cycle 3 treatment.  #Bilateral PE and bilateral lower extremity DVT, right heart strain. Continue Lovenox 1 mg/kg twice daily.   # Family history ovarian cancer and personal history of endometrial CA.  We have referred her to genetic counselor for genetic testing.  #Recurrent right pleural effusion, negative for malignancy involvement.  Questionable reactive to bilateral PE Continue monitor.  Hold another thoracentesis given that patient had recent thrombosis and prefer not to interrupt anticoagulation unless absolutely needed. We spent sufficient time to discuss many aspect of care, questions were answered to patient's satisfaction. The patient knows to call the clinic with any problems questions or concerns.  Return of visit: 1 week   Earlie Server, MD, PhD Hematology Oncology [facility] Pager- [phone] 09/26/2018

## 2018-09-26 NOTE — Progress Notes (Signed)
Patient here for follow up. Patient was at [facility] from Friday 9/27- tues 10/1. She is now on lovenox.q 12 hrs. Increased shortness of breath with walking.

## 2018-09-26 NOTE — Progress Notes (Signed)
Per Almyra Free CMA per Dr. Tasia Catchings. Pt to receive 20 mg of IV Decadron in place of 12 mg of Decadron.

## 2018-09-27 LAB — CA 125: CANCER ANTIGEN (CA) 125: 521 U/mL — AB (ref 0.0–38.1)

## 2018-09-28 ENCOUNTER — Inpatient Hospital Stay: Payer: 59

## 2018-09-28 DIAGNOSIS — C541 Malignant neoplasm of endometrium: Secondary | ICD-10-CM

## 2018-09-28 MED ORDER — PEGFILGRASTIM INJECTION 6 MG/0.6ML ~~LOC~~
6.0000 mg | PREFILLED_SYRINGE | Freq: Once | SUBCUTANEOUS | Status: AC
  Administered 2018-09-28: 6 mg via SUBCUTANEOUS

## 2018-10-01 ENCOUNTER — Telehealth: Payer: Self-pay

## 2018-10-01 NOTE — Telephone Encounter (Signed)
Spoke to Dr. Tasia Catchings. She would like for Karen Mann to have thoracentesis. Thoracentesis scheduled for 10/8 with arrival time of 1030 for 1100 biopsy. Per Dr. Tasia Catchings she was instructed to hold her am dose of Lovenox on 10/8. Instructed to take her pm dose of Lovenox on 10/8 around 1800. She will keep her appointment for this Thursday as scheduled with Dr. Tasia Catchings. Read back performed.

## 2018-10-01 NOTE — Discharge Instructions (Signed)
Thoracentesis, Care After °Refer to this sheet in the next few weeks. These instructions provide you with information about caring for yourself after your procedure. Your health care provider may also give you more specific instructions. Your treatment has been planned according to current medical practices, but problems sometimes occur. Call your health care provider if you have any problems or questions after your procedure. °What can I expect after the procedure? °After your procedure, it is common to have pain at the puncture site. °Follow these instructions at home: °· Take medicines only as directed by your health care provider. °· You may return to your normal diet and normal activities as directed by your health care provider. °· Drink enough fluid to keep your urine clear or pale yellow. °· Do not take baths, swim, or use a hot tub until your health care provider approves. °· Follow your health care provider's instructions about: °? Puncture site care. °? Bandage (dressing) changes and removal. °· Check your puncture site every day for signs of infection. Watch for: °? Redness, swelling, or pain. °? Fluid, blood, or pus. °· Keep all follow-up visits as directed by your health care provider. This is important. °Contact a health care provider if: °· You have redness, swelling, or pain at your puncture site. °· You have fluid, blood, or pus coming from your puncture site. °· You have a fever. °· You have chills. °· You have nausea or vomiting. °· You have trouble breathing. °· You develop a worsening cough. °Get help right away if: °· You have extreme shortness of breath. °· You develop chest pain. °· You faint or feel light-headed. °This information is not intended to replace advice given to you by your health care provider. Make sure you discuss any questions you have with your health care provider. °Document Released: 01/02/2015 Document Revised: 08/13/2016 Document Reviewed: 09/23/2014 °Elsevier  Interactive Patient Education © 2018 [employer] ° °

## 2018-10-01 NOTE — Telephone Encounter (Signed)
Received call from Ms. Watt. She reports taking her pulse ox throughout the weekend and her sat stayed at 85-90% on room air at rest. She is currently at 94% on room air at rest. She continues to report shortness of breath with minimal exertion. Shortness of breath worse throughout the night time hours. Last thoracentesis 9/25. Continues on Lovenox for bilateral PE's. Please advise. Oncology Nurse Navigator Documentation  Navigator Location: [facility] (10/01/18 1200)   )Navigator Encounter Type: Telephone (10/01/18 1200) Telephone: Incoming Call;Symptom Mgt (10/01/18 1200)                                                  Time Spent with Patient: 15 (10/01/18 1200)

## 2018-10-02 ENCOUNTER — Ambulatory Visit
Admission: RE | Admit: 2018-10-02 | Discharge: 2018-10-02 | Disposition: A | Discharge status: Home / Self Care | Payer: 59 | Source: Ambulatory Visit | Attending: Interventional Radiology | Admitting: Interventional Radiology

## 2018-10-02 ENCOUNTER — Ambulatory Visit
Admission: RE | Admit: 2018-10-02 | Discharge: 2018-10-02 | Disposition: A | Discharge status: Home / Self Care | Payer: 59 | Source: Ambulatory Visit | Attending: Oncology | Admitting: Oncology

## 2018-10-02 DIAGNOSIS — Z9889 Other specified postprocedural states: Secondary | ICD-10-CM | POA: Insufficient documentation

## 2018-10-02 DIAGNOSIS — Z7689 Persons encountering health services in other specified circumstances: Secondary | ICD-10-CM | POA: Diagnosis not present

## 2018-10-02 DIAGNOSIS — J90 Pleural effusion, not elsewhere classified: Secondary | ICD-10-CM | POA: Diagnosis not present

## 2018-10-02 DIAGNOSIS — J910 Malignant pleural effusion: Secondary | ICD-10-CM | POA: Diagnosis not present

## 2018-10-03 ENCOUNTER — Ambulatory Visit: Payer: 59

## 2018-10-04 ENCOUNTER — Other Ambulatory Visit: Payer: Self-pay

## 2018-10-04 ENCOUNTER — Inpatient Hospital Stay (HOSPITAL_BASED_OUTPATIENT_CLINIC_OR_DEPARTMENT_OTHER): Payer: 59 | Admitting: Oncology

## 2018-10-04 ENCOUNTER — Inpatient Hospital Stay: Payer: 59

## 2018-10-04 ENCOUNTER — Encounter: Payer: Self-pay | Admitting: Oncology

## 2018-10-04 VITALS — BP 132/90 | HR 92 | Temp 96.6°F | Resp 18 | Wt 223.0 lb

## 2018-10-04 DIAGNOSIS — C772 Secondary and unspecified malignant neoplasm of intra-abdominal lymph nodes: Secondary | ICD-10-CM

## 2018-10-04 DIAGNOSIS — Z79899 Other long term (current) drug therapy: Secondary | ICD-10-CM

## 2018-10-04 DIAGNOSIS — I824Y3 Acute embolism and thrombosis of unspecified deep veins of proximal lower extremity, bilateral: Secondary | ICD-10-CM

## 2018-10-04 DIAGNOSIS — I2699 Other pulmonary embolism without acute cor pulmonale: Secondary | ICD-10-CM

## 2018-10-04 DIAGNOSIS — R0602 Shortness of breath: Secondary | ICD-10-CM

## 2018-10-04 DIAGNOSIS — R188 Other ascites: Secondary | ICD-10-CM

## 2018-10-04 DIAGNOSIS — J90 Pleural effusion, not elsewhere classified: Secondary | ICD-10-CM | POA: Diagnosis not present

## 2018-10-04 DIAGNOSIS — C541 Malignant neoplasm of endometrium: Secondary | ICD-10-CM

## 2018-10-04 DIAGNOSIS — D500 Iron deficiency anemia secondary to blood loss (chronic): Secondary | ICD-10-CM

## 2018-10-04 DIAGNOSIS — Z7901 Long term (current) use of anticoagulants: Secondary | ICD-10-CM

## 2018-10-04 LAB — CBC WITH DIFFERENTIAL/PLATELET
Abs Immature Granulocytes: 0.14 10*3/uL — ABNORMAL HIGH (ref 0.00–0.07)
BASOS PCT: 1 %
Basophils Absolute: 0.1 10*3/uL (ref 0.0–0.1)
EOS ABS: 0.3 10*3/uL (ref 0.0–0.5)
Eosinophils Relative: 3 %
HEMATOCRIT: 33.7 % — AB (ref 36.0–46.0)
Hemoglobin: 10.2 g/dL — ABNORMAL LOW (ref 12.0–15.0)
IMMATURE GRANULOCYTES: 1 %
Lymphocytes Relative: 23 %
Lymphs Abs: 2.3 10*3/uL (ref 0.7–4.0)
MCH: 26.7 pg (ref 26.0–34.0)
MCHC: 30.3 g/dL (ref 30.0–36.0)
MCV: 88.2 fL (ref 80.0–100.0)
MONOS PCT: 8 %
Monocytes Absolute: 0.8 10*3/uL (ref 0.1–1.0)
NEUTROS ABS: 6.5 10*3/uL (ref 1.7–7.7)
Neutrophils Relative %: 64 %
Platelets: 173 10*3/uL (ref 150–400)
RBC: 3.82 MIL/uL — ABNORMAL LOW (ref 3.87–5.11)
RDW: 24.4 % — AB (ref 11.5–15.5)
WBC Morphology: 4
WBC: 10 10*3/uL (ref 4.0–10.5)
nRBC: 0 % (ref 0.0–0.2)

## 2018-10-04 LAB — COMPREHENSIVE METABOLIC PANEL
ALT: 21 U/L (ref 0–44)
AST: 20 U/L (ref 15–41)
Albumin: 3.6 g/dL (ref 3.5–5.0)
Alkaline Phosphatase: 70 U/L (ref 38–126)
Anion gap: 9 (ref 5–15)
BILIRUBIN TOTAL: 0.4 mg/dL (ref 0.3–1.2)
BUN: 15 mg/dL (ref 6–20)
CALCIUM: 9 mg/dL (ref 8.9–10.3)
CO2: 23 mmol/L (ref 22–32)
Chloride: 105 mmol/L (ref 98–111)
Creatinine, Ser: 0.65 mg/dL (ref 0.44–1.00)
GFR calc Af Amer: >60 mL/min (ref >60)
Glucose, Bld: 120 mg/dL — ABNORMAL HIGH (ref 70–99)
POTASSIUM: 3.9 mmol/L (ref 3.5–5.1)
Sodium: 137 mmol/L (ref 135–145)
TOTAL PROTEIN: 6.6 g/dL (ref 6.5–8.1)

## 2018-10-04 NOTE — Progress Notes (Signed)
Patient here for follow up. Felling "much better" after thoracentesis.

## 2018-10-04 NOTE — Progress Notes (Signed)
Hematology/Oncology follow up  note [facility] Telephone:([phone] Fax:([phone]   Patient Care Team: Virginia Crews, MD as PCP - General (Family Medicine) Clent Jacks, RN as Registered Nurse  REFERRING PROVIDER: [facility] VISIT Follow up for treatment of  endometrial cancer.   HISTORY OF PRESENTING ILLNESS:  Karen Mann is a  57 y.o.  female with PMH listed below who was referred to me for evaluation of newly diagnosed endometrial cancer. Patient was recently referred to GYN Dr. Gilman Schmidt for abnormal uterine bleeding. 07/23/2018 ultrasound was performed showed large pelvic mass 20 x 13 x 22 cm with cystic and solid components.  Ultrasound also showed 4.9 cm fibroid and thickened endometrium 15 mm thickness 08/05/2018 MRI pelvis with and without contrast 20.6 cm complex cystic solid mass in the pelvis with numerous thickened enhancing septations, compatible with malignant ovarian neoplasm.  Neither ovary was discretely visualized.  Small volume of ascites.  Cross paratonia disease is not visualized but not excluded.  Left hydro-ureteronephrosis secondary to extrinsic compression. 5.3 intramural anterior uterine body fibroid.  #Endometrial biopsy was performed.  Pathology showed endometrioid adenocarcinoma consistent with FIGO grade 3.  8/12/19CA125 was elevated at 622 Patient was referred to see Dr. Fransisca Connors.  She was evaluated by Dr. Meredith Mody on 08/08/2018.  Staging CT was obtained. #Images independently reviewed by me.   Staging CT showed large cystic and a solid mass likely arising from the ovaries, measures 369 0 cc, suspicious for ovarian malignancy.  Moderate ascites noted.  Raising concern for malignant peritoneal spread.  No well-defined omental caking or solid tumor deposits along a situs., -Large right pleural effusion over half of right hemithorax volume. -Liver hypodensity 2.8 x 2.8 cm -Moderate left and mild right  hydronephrosis attributed to the ureteral compression due to mass. -Other chronic image findings.  MRI pelvis with and without contrast again showed 20.6 cm complex cystic/solid mass in the pelvis.  Compatible with malignant ovarian neoplasm.  Small volume of a situs.  Cross peritoneal disease is not visualized but cannot be excluded.  Left hydroureteronephrosis secondary to compression.  #Patient was scheduled to have a diagnostic and therapeutic thoracentesis this morning.  Cytology pending. She feels breathing is much better after the thoracentesis. Mild shortness of breath, worse with exertion.  Heart palpitation as well denies any chest pain, abdominal pain. Ongoing vaginal bleeding, she passed a big clot today. Feel extremely fatigued. She feels overwhelming and very nervous about the cancer diagnosis.  She has insomnia and she has tried melatonin which did not help with the sleep.  # Dr. Fransisca Connors recommends systemic therapy with carboplatin and paclitaxel 3 cycles, reassessment for surgical evaluation.per Dr.Berchuck, not candidate for Pembrolizumab and Levetinib trial.   # 08/10/2018 thoracentesis  with 1.2 L fluid removed.  Cytology negative for malignancy. 08/16/2018 status post thoracentesis again and removed 2.2 L of mildly bloody fluid.  I discussed with Dr. Dicie Beam and she reviewed the slides there was no malignancy cells. Pleural fluid LDH 246, albumin 2.5, protein 4.2 Serum LDH 270.  Protein level 6.4 08/22/2018, another thoracentesis both liters of fluid.  Cytology negative.  Patient's case was discussed on GYN tumor conference.  Dr. Theora Gianotti recommends NexGen sequence molecular testing.  And also check for PDL 1.  # CT PE study showed bilateral pulmonary embolism with right heart strain..  Lower extremity venous Doppler showed bilateral DVT  1. Deep vein thrombus in the right popliteal vein; thrombus extends into the right tibioperoneal trunk  and proximal and mid portions of the  right peroneal veins. 2. Deep vein thrombus in the left common femoral vein and in the left popliteal vein. 3. Occlusive thrombus is seen in the left greater saphenous vein, a superficial vein.  Patient was admitted at [facility] and started on heparin drip.  Discharged on Lovenox 100 mg subcutaneous twice daily. Debulking surgery was delayed due to PE and DVT  INTERVAL HISTORY Karen Mann is a 57 y.o. female with above history reviewed by me today presents for follow-up for  endometrial cancer. Status post 3 cycles of neoadjuvant chemotherapy with carboplatin and Taxol. She tolerates chemotherapy well.  Denies any nausea vomiting. Vaginal bleeding has stopped. During the interval she called in reporting that her shortness of breath has got worse.  She was sent for another ultrasound thoracentesis.  And drained 2.6 L of fluid.  Today she reports feeling shortness of breath has significantly improved.  #Bilateral lower extremity DVT, s/p IVC filter. LE swelling slightly better.    Review of Systems  Constitutional: Positive for malaise/fatigue. Negative for chills, fever and weight loss.  HENT: Negative for nosebleeds and sore throat.   Eyes: Negative for double vision, photophobia and redness.  Respiratory: Negative for cough, shortness of breath and wheezing.   Cardiovascular: Positive for leg swelling. Negative for chest pain, palpitations and orthopnea.  Gastrointestinal: Negative for abdominal pain, blood in stool, nausea and vomiting.  Genitourinary: Negative for dysuria.  Musculoskeletal: Negative for back pain, myalgias and neck pain.       Chronic back pain  Skin: Negative for itching and rash.  Neurological: Negative for dizziness, tingling and tremors.  Endo/Heme/Allergies: Negative for environmental allergies. Does not bruise/bleed easily.  Psychiatric/Behavioral: Negative for depression. The patient is not nervous/anxious.     MEDICAL HISTORY:  Past Medical History:    Diagnosis Date  . Allergy   . Anemia 07/20/2018  . Endometrial adenocarcinoma ([place]) 07/2018  . Fibroid uterus 07/24/2018   5cm/2 inch   . Ovarian mass 07/24/2018   22cm/10 inch  . Pelvic mass     SURGICAL HISTORY: Past Surgical History:  Procedure Laterality Date  . EYE SURGERY  2000   Lasik  . PORTA CATH INSERTION N/A 08/13/2018   Procedure: PORTA CATH INSERTION;  Surgeon: Algernon Huxley, MD;  Location: [place] [facility];  Service: Cardiovascular;  Laterality: N/A;    SOCIAL HISTORY: Social History   Socioeconomic History  . Marital status: Married    Spouse name: Not on file  . Number of children: Not on file  . Years of education: Not on file  . Highest education level: Not on file  Occupational History  . Not on file  Social Needs  . Financial resource strain: Not on file  . Food insecurity:    Worry: Not on file    Inability: Not on file  . Transportation needs:    Medical: Not on file    Non-medical: Not on file  Tobacco Use  . Smoking status: Never Smoker  . Smokeless tobacco: Never Used  Substance and Sexual Activity  . Alcohol use: No  . Drug use: No  . Sexual activity: Not Currently    Birth control/protection: Post-menopausal  Lifestyle  . Physical activity:    Days per week: Not on file    Minutes per session: Not on file  . Stress: Not on file  Relationships  . Social connections:    Talks on phone: Not on file  Gets together: Not on file    Attends religious service: Not on file    Active member of club or organization: Not on file    Attends meetings of clubs or organizations: Not on file    Relationship status: Not on file  . Intimate partner violence:    Fear of current or ex partner: Not on file    Emotionally abused: Not on file    Physically abused: Not on file    Forced sexual activity: Not on file  Other Topics Concern  . Not on file  Social History Narrative  . Not on file    FAMILY HISTORY: Family History  Problem  Relation Age of Onset  . Hypertension Father   . Ovarian cancer Mother 77       deceased 65  . Healthy Brother   . Skin cancer Paternal Grandmother        deceased 77s  . Cancer Paternal Grandfather        unk. primary; deceased 26s  . Ovarian cancer Other        mat grandmother's sister    ALLERGIES:  is allergic to penicillins.  MEDICATIONS:  Current Outpatient Medications  Medication Sig Dispense Refill  . acetaminophen (TYLENOL) 325 MG tablet Take by mouth. Take 650 mg by mouth every 4 (four) hours as needed for Pain    . ALPRAZolam (XANAX) 0.25 MG tablet Take 1 tablet (0.25 mg total) by mouth at bedtime as needed for anxiety or sleep. 20 tablet 0  . dexamethasone (DECADRON) 4 MG tablet Take 2 tablets (8 mg total) by mouth See admin instructions. 2 days before and 2 days after chemotherapy 30 tablet 1  . docusate sodium (COLACE) 50 MG capsule Take by mouth. Take 100 mg by mouth 2 (two) times daily    . enoxaparin (LOVENOX) 100 MG/ML injection Inject into the skin. Inject 1 mL (100 mg total) subcutaneously every 12 (twelve) hours    . gabapentin (NEURONTIN) 300 MG capsule Take 1 capsule (300 mg total) by mouth at bedtime. 30 capsule 0  . lidocaine-prilocaine (EMLA) cream Apply to affected area once 30 g 3  . ondansetron (ZOFRAN) 8 MG tablet Take 1 tablet (8 mg total) by mouth 2 (two) times daily as needed for refractory nausea / vomiting. Start on day 3 after chemo. 30 tablet 1  . oxyCODONE (ROXICODONE) 5 MG immediate release tablet Take 1 tablet (5 mg total) by mouth every 6 (six) hours as needed for moderate pain, severe pain or breakthrough pain. 10 tablet 0  . Polyethylene Glycol 3350 (MIRALAX PO) Take by mouth daily.     . prochlorperazine (COMPAZINE) 10 MG tablet Take 1 tablet (10 mg total) by mouth every 6 (six) hours as needed (Nausea or vomiting). 30 tablet 1  . furosemide (LASIX) 20 MG tablet Take 1 tablet (20 mg total) by mouth daily as needed for fluid. (Patient not  taking: Reported on 09/05/2018) 30 tablet 0  . potassium chloride SA (K-DUR,KLOR-CON) 20 MEQ tablet Take 1 tablet (20 mEq total) by mouth daily as needed (take on the days you take Lasix.). (Patient not taking: Reported on 10/04/2018) 60 tablet 0  . rOPINIRole (REQUIP) 0.25 MG tablet Take 1 tablet (0.25 mg total) by mouth at bedtime. Take 1-3 hours before bedtime (Patient not taking: Reported on 09/26/2018) 30 tablet 3   No current facility-administered medications for this visit.      PHYSICAL EXAMINATION: ECOG PERFORMANCE STATUS: 1 - Symptomatic but completely  ambulatory Vitals:   10/04/18 1350  BP: 132/90  Pulse: 92  Resp: 18  Temp: (!) 96.6 F (35.9 C)   Filed Weights   10/04/18 1350  Weight: 223 lb (101.2 kg)    Physical Exam  Constitutional: She is oriented to person, place, and time. No distress.  HENT:  Head: Normocephalic and atraumatic.  Mouth/Throat: Oropharynx is clear and moist.  Eyes: Pupils are equal, round, and reactive to light. EOM are normal. No scleral icterus.  Neck: Normal range of motion. Neck supple.  Cardiovascular: Normal rate, regular rhythm and normal heart sounds.  Pulmonary/Chest: Effort normal. No respiratory distress. She has no wheezes.  decreased breath sound at right base,  Abdominal: Soft. Bowel sounds are normal. She exhibits mass. She exhibits no distension. There is no tenderness.  Palpable lower abdominal/pelvis mass  Musculoskeletal: Normal range of motion. She exhibits edema. She exhibits no deformity.  Bilateral 1+ pitting edema  Neurological: She is alert and oriented to person, place, and time. No cranial nerve deficit. Coordination normal.  Skin: Skin is warm and dry. No rash noted. No erythema.  Psychiatric: She has a normal mood and affect. Her behavior is normal. Thought content normal.     LABORATORY DATA:  I have reviewed the data as listed Lab Results  Component Value Date   WBC 10.0 10/04/2018   HGB 10.2 (L) 10/04/2018    HCT 33.7 (L) 10/04/2018   MCV 88.2 10/04/2018   PLT 173 10/04/2018   Recent Labs    09/11/18 1050 09/26/18 0822 10/04/18 1336  NA 137 141 137  K 4.0 3.7 3.9  CL 104 108 105  CO2 24 24 23   GLUCOSE 101* 98 120*  BUN 13 10 15   CREATININE 0.74 0.82 0.65  CALCIUM 8.8* 9.3 9.0  GFRNONAA >60 >60 >60  GFRAA >60 >60 >60  PROT 6.4* 6.9 6.6  ALBUMIN 3.4* 3.4* 3.6  AST 18 16 20   ALT 11 8 21   ALKPHOS 103 80 70  BILITOT 0.7 0.4 0.4   Iron/TIBC/Ferritin/ %Sat    Component Value Date/Time   IRON 25 (L) 09/05/2018 0819   IRON 11 (L) 07/17/2018 1124   TIBC 245 (L) 09/05/2018 0819   TIBC 312 07/17/2018 1124   FERRITIN 323 (H) 09/05/2018 0819   FERRITIN 60 07/17/2018 1124   IRONPCTSAT 10 (L) 09/05/2018 0819   IRONPCTSAT 4 (LL) 07/17/2018 1124    RADIOGRAPHIC STUDIES: I have personally reviewed the radiological images as listed and agreed with the findings in the report. 08/24/1999 nineteen 2D echo showed LVEF 55 to 60%. 08/30/2018 CXR Interval reduction/resolution of right-sided pleural effusion post thoracentesis. No pneumothorax.  09/23/2018, [place] lower extremity venous bilaterally positive for bilateral DVT CT chest PE protocol showed bilateral pulmonary emboli, most centrally in the bilateral main pulmonary arteries.  There are associated with signs suggestive of right heart strain.  No evidence of pulmonary infarct.  Moderate right pleural effusion.  Partially visualized left hydronephrosis also present on prior CT.   ASSESSMENT & PLAN:  1. Endometrial adenocarcinoma ([place])   2. Recurrent pleural effusion on right   3. Iron deficiency anemia due to chronic blood loss   4. Bilateral pulmonary embolism ([place])   5. Acute deep vein thrombosis (DVT) of proximal vein of both lower extremities (HCC)    #  FIGO grade 3 endometrial adenocarcinoma, clinically with extrauterine disease including intra-abdominal metastasis, Ascites, pleural effusion, and/or concomitant ovarian malignancies.     Post 3 cycles of neoadjuvant carboplatin and  Taxol treatment.  Ca1 25 trended down to 500s Discussed with Dr. Fransisca Connors.  Patient will be seen by heme at the end of this month for reevaluation for debulking surgery.  #Bilateral PE and bilateral lower extremity DVT, right heart strain.  Continue Lovenox 1 mg/kg twice daily.  # Family history ovarian cancer and personal history of endometrial CA.  Genetic testing sent.  Results pending.  #Recurrent right pleural effusion, negative for malignancy cells on cytology multiple times.  Etiology unknown.  Possible reactive to bilateral PE or due to bulky abdominal mass.  Continue ultrasound thoracentesis as needed if she has symptoms. I have discussed with Dr. Fransisca Connors and we both agree on deferring Pleurx insertion into she receives debulking surgery after which will decide whether she will need Pleurx or not.  We spent sufficient time to discuss many aspect of care, questions were answered to patient's satisfaction. The patient knows to call the clinic with any problems questions or concerns.  Return of visit:  To be determined.  Total face to face encounter time for this patient visit was 25 min. >50% of the time was  spent in counseling and coordination of care.   Earlie Server, MD, PhD Hematology Oncology [facility] at [facility] Pager- [phone] 10/04/2018

## 2018-10-05 LAB — CYTOLOGY - NON PAP

## 2018-10-05 LAB — CA 125: Cancer Antigen (CA) 125: 377 U/mL — ABNORMAL HIGH (ref 0.0–38.1)

## 2018-10-08 ENCOUNTER — Telehealth: Payer: Self-pay | Admitting: Genetic Counselor

## 2018-10-08 NOTE — Telephone Encounter (Signed)
Cancer Genetics             Telegenetics Results Disclosure   Patient Name: Karen Mann Patient DOB: 05/30/1961 Patient Age: 57 y.o. Phone Call Date: 10/08/2018  Referring Provider: Earlie Server, MD    Ms. Fesler was called today to discuss genetic test results. Please see the Genetics telephone note from 09/10/18 for a detailed discussion of her personal and family histories and the recommendations provided.  Genetic Testing: At the time of Ms. Shutter's telegenetics visit, she decided to pursue genetic testing of multiple genes associated with hereditary susceptibility to cancer. Testing included sequencing and deletion/duplication analysis. 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 Per [facility], the allele frequency for the variant was noted at 12-18%. A copy of the genetic test report will be scanned into Epic under the Media tab. ClinVar ID [id number].  Two Variants of Uncertain Significance were detected: POLE c.4513C>G (p.Pro1505Ala) and RECQL4 c.1708C>T (p.Arg570Trp). While at this time, it is unknown if this finding is associated with increased cancer risk, the majority of these variants get reclassified to be inconsequential. Medical management should not be based on this finding. The lab will eventually determine the significance, if any. If it gets reclassified by the lab, she will receive a new report via their secure portal, by secure email, or by mail. Ms. Weatherly should stay in contact with [place] on a yearly basis if she is interested in knowing the status of this result and keep her address and phone number up to date in the system.  Interpretation: NF1 is a fairly common genetic condition, occurring with an incidence at birth of approximately one in 3,000 individuals. NF1 can lead to abnormal growths on nerves throughout the body. It is frequently recognized on the basis of clinical characteristics.  Mosaic NF1 is diagnosed in individuals who have somatic mosaicism for an NF1 pathogenic variant. Clinically, mosaic NF1 may be localized to one segment of the body or may be generalized, involving more than one segment. Even when it is generalized, mosaic NF1 is usually milder than non-mosaic NF1 involving the same pathogenic variant.   A diagnosis may be made clinically in the presence of two or more of the following features:   Six or more caf-au-lait macules over 5 mm in greatest diameter in prepubertal individuals and over 15 mm in greatest diameter in postpubertal individuals  Two or more neurofibromas of any type or one plexiform neurofibroma  Freckling in the axillary or inguinal regions  Optic glioma  Two or more Lisch nodules (iris hamartomas)  A distinctive osseous lesion such as sphenoid dysplasia or tibial pseudarthrosis  A first-degree relative (parent, sibling, or child) with NF1 as defined by the above criteria  Ms. Tsukamoto reported that she does not have these features. She does not meet clinical criteria for NF1. Because this is a telegenetics visit, we discussed the option of a more formal evaluation and physical exam by a geneticist.  Genes Analyzed: The genes analyzed were the 84 genes on [facility]'s Multi-Cancer panel (AIP, ALK, APC, ATM, AXIN2, BAP1, BARD1, BLM, BMPR1A, BRCA1, BRCA2, BRIP1, CASR, CDC73, CDH1, CDK4, CDKN1B, CDKN1C, CDKN2A, CEBPA, CHEK2, CTNNA1, DICER1, DIS3L2, EGFR, EPCAM, FH, FLCN, GATA2, GPC3, GREM1, HOXB13, HRAS, KIT, MAX, MEN1, MET, MITF, MLH1, MSH2, MSH3, MSH6, MUTYH, NBN, NF1, NF2, NTHL1,  PALB2, PDGFRA, PHOX2B, PMS2, POLD1, POLE, POT1, PRKAR1A, PTCH1, PTEN, RAD50, RAD51C, RAD51D, RB1, RECQL4, RET, RUNX1, SDHA, SDHAF2, SDHB, SDHC, SDHD, SMAD4, SMARCA4, SMARCB1, SMARCE1, STK11, SUFU, TERC, TERT, TMEM127, TP53, TSC1, TSC2, VHL, WRN, WT1).  Cancer Screening: Ms. Radice is recommended to follow the cancer screening guidelines provided by her physicians.     Family Members: Family members are at some increased risk of developing cancer, over the general population risk, simply due to the family history. They are recommended to speak with their own providers about appropriate cancer screenings.   If her relatives are concerned about the NF1 finding in Ms. Valli, they can pursue genetic testing to rule out that they have the NF1 variant, but the likelihood that they have this is fairly low.  Family members are not recommended to get tested for the above VUS outside of a research protocol as this finding has no implications for their medical management.  Follow-Up: Ms. Sprecher indicated she will think about whether she wants to be seen for a formal NF1 evaluation or whether she would like to be seen for an in-person genetic counseling visit with one of the genetic counselors at [facility] in [place]. We discussed that her son can be seen there for genetic counseling if he wants to pursue genetic testing.  Cancer genetics is a rapidly advancing field and it is possible that new genetic tests will be appropriate for Ms. Landini in the future. Ms. Matkins is encouraged to remain in contact with Genetics on an annual basis so we can update her personal and family histories, and let her know of advances in cancer genetics that may benefit the family. Ms. Bruns questions were answered to her satisfaction today, and she knows she is welcome to call anytime with additional questions.      Steele Berg, MS, [place] Certified Genetic Counselor phone: [phone]

## 2018-10-09 ENCOUNTER — Telehealth: Payer: Self-pay

## 2018-10-09 NOTE — Telephone Encounter (Signed)
Received call from [name] requesting thoracentesis due to increasing shortness of breath. Not as bad as it usually gets but she is going out of town this weekend and does not want it to progress. Last thoracentesis 10/02/18. She has a standing order for thoracentesis. She continues on Lovenox. Message sent to special scheduling to assist in arranging. Oncology Nurse Navigator Documentation  Navigator Location: [facility] (10/09/18 1400)   )Navigator Encounter Type: Telephone (10/09/18 1400) Telephone: Karen Mann Call;Incoming Call;Patient Update;Symptom Mgt (10/09/18 1400)                       Barriers/Navigation Needs: Coordination of Care (10/09/18 1400)   Interventions: Coordination of Care (10/09/18 1400)                      Time Spent with Patient: 15 (10/09/18 1400)

## 2018-10-10 ENCOUNTER — Other Ambulatory Visit: Payer: Self-pay | Admitting: Oncology

## 2018-10-10 ENCOUNTER — Telehealth: Payer: Self-pay

## 2018-10-10 DIAGNOSIS — J90 Pleural effusion, not elsewhere classified: Secondary | ICD-10-CM

## 2018-10-10 DIAGNOSIS — R0602 Shortness of breath: Secondary | ICD-10-CM

## 2018-10-10 DIAGNOSIS — C541 Malignant neoplasm of endometrium: Secondary | ICD-10-CM

## 2018-10-10 NOTE — Telephone Encounter (Signed)
Called and spoke with Pamala Hurry in special scheduling to arrange thoracentesis. She does not need to hold Lovenox for thoracentesis per checklist. This was confirmed. Oncology Nurse Navigator Documentation  Navigator Location: [facility] (10/10/18 0900)   )Navigator Encounter Type: Telephone (10/10/18 0900) Telephone: [name] Call (10/10/18 0900)                                                  Time Spent with Patient: 15 (10/10/18 0900)

## 2018-10-10 NOTE — Telephone Encounter (Signed)
Called and left voicemail with [name] regarding thoracentesis. Scheduled for 10/17 with arrival time of 11am. Per scheduling she does not need to hold her Lovenox. Verified with Juanita in special procedures.  Oncology Nurse Navigator Documentation  Navigator Location: [facility] (10/10/18 1500)   )Navigator Encounter Type: Telephone (10/10/18 1500) Telephone: [name] Call;Appt Confirmation/Clarification (10/10/18 1500)                                                  Time Spent with Patient: 15 (10/10/18 1500)

## 2018-10-11 ENCOUNTER — Ambulatory Visit
Admission: RE | Admit: 2018-10-11 | Discharge: 2018-10-11 | Disposition: A | Discharge status: Home / Self Care | Payer: 59 | Source: Ambulatory Visit | Attending: Oncology | Admitting: Oncology

## 2018-10-11 ENCOUNTER — Ambulatory Visit
Admission: RE | Admit: 2018-10-11 | Discharge: 2018-10-11 | Disposition: A | Discharge status: Home / Self Care | Payer: 59 | Source: Ambulatory Visit | Attending: Interventional Radiology | Admitting: Interventional Radiology

## 2018-10-11 DIAGNOSIS — C541 Malignant neoplasm of endometrium: Secondary | ICD-10-CM | POA: Diagnosis not present

## 2018-10-11 DIAGNOSIS — J90 Pleural effusion, not elsewhere classified: Secondary | ICD-10-CM

## 2018-10-11 DIAGNOSIS — Z9889 Other specified postprocedural states: Secondary | ICD-10-CM

## 2018-10-11 DIAGNOSIS — R0602 Shortness of breath: Secondary | ICD-10-CM | POA: Diagnosis not present

## 2018-10-11 IMAGING — US US THORACENTESIS ASP PLEURAL SPACE W/IMG GUIDE
1 series · 3 of 3 positions shown · non-contrast
Comparison: none

INDICATION: Endometrial adenocarcinoma with recurrent right pleural effusion

[Series 1: us thoracentesis asp pleural space w/img guide · 0.25mm/px · 3 of 3 slices shown]
[im 1/3]
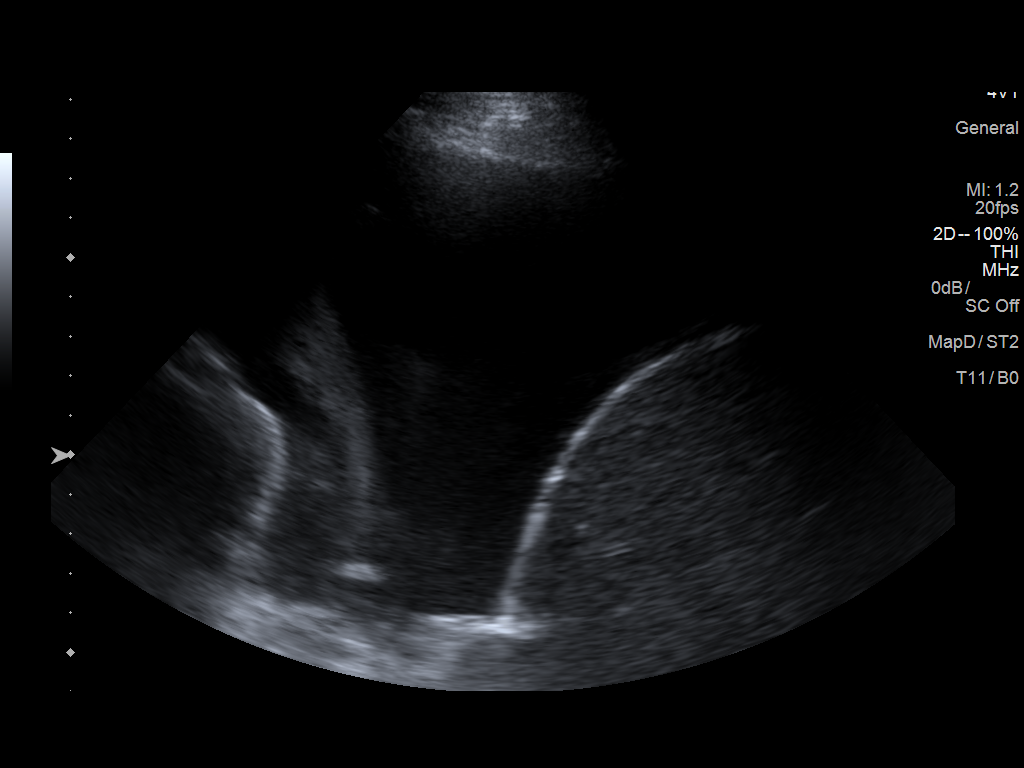
[im 2/3]
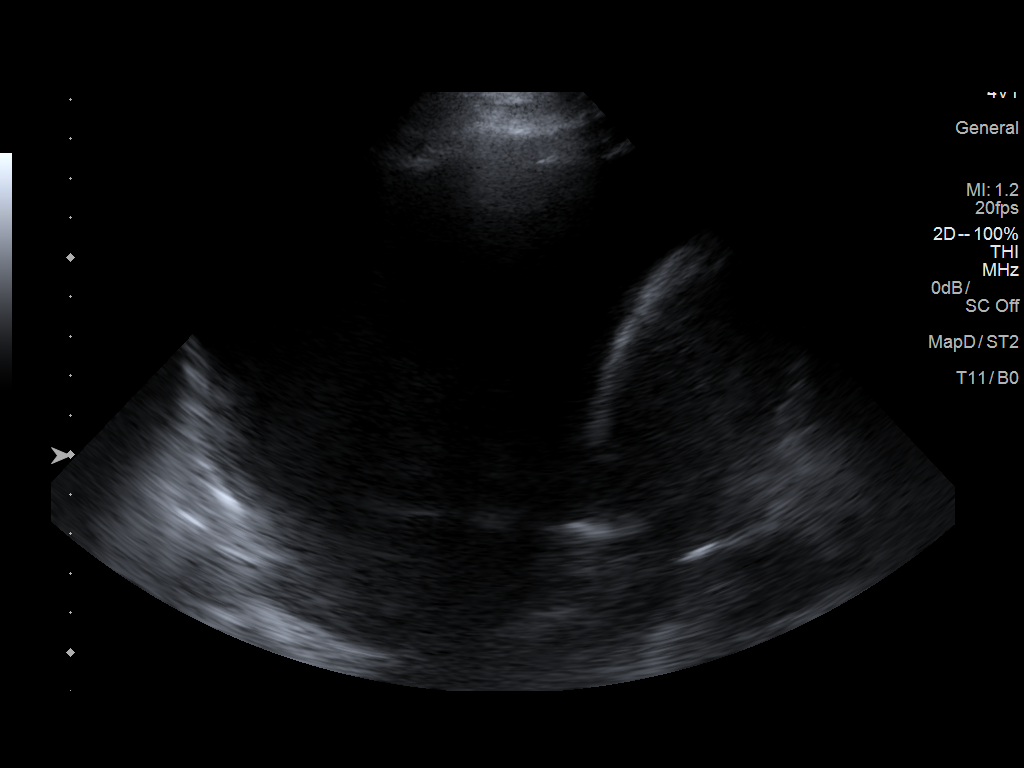
[im 3/3]
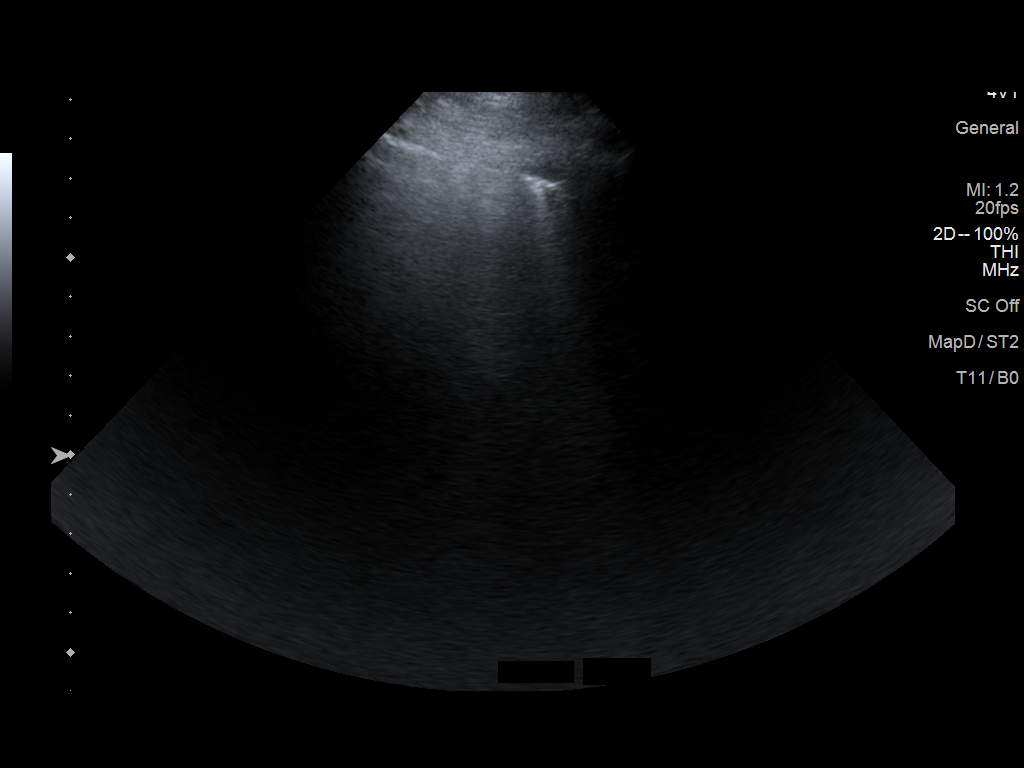

[3 of 3 positions shown; findings below may reference images not displayed]

EXAM:
ULTRASOUND GUIDED RIGHT THORACENTESIS

MEDICATIONS:
None.

COMPLICATIONS:
None immediate.

PROCEDURE:
An ultrasound guided thoracentesis was thoroughly discussed with the
patient and questions answered. The benefits, risks, alternatives
and complications were also discussed. The patient understands and
wishes to proceed with the procedure. Written consent was obtained.

Ultrasound was performed to localize and mark an adequate pocket of
fluid in the right chest. The area was then prepped and draped in
the normal sterile fashion. 1% Lidocaine was used for local
anesthesia. Under ultrasound guidance a Safe-T-Centesis catheter was
introduced. Thoracentesis was performed. The catheter was removed
and a dressing applied.
FINDINGS: A total of approximately 1.7 L of clear yellow fluid was removed.
IMPRESSION: Successful ultrasound guided right thoracentesis yielding 1.7 L of
pleural fluid.

Follow-up chest radiograph shows no pneumothorax.

## 2018-10-19 DIAGNOSIS — C541 Malignant neoplasm of endometrium: Secondary | ICD-10-CM | POA: Diagnosis not present

## 2018-10-19 DIAGNOSIS — I2699 Other pulmonary embolism without acute cor pulmonale: Secondary | ICD-10-CM | POA: Diagnosis not present

## 2018-10-19 DIAGNOSIS — N1330 Unspecified hydronephrosis: Secondary | ICD-10-CM | POA: Diagnosis not present

## 2018-10-19 DIAGNOSIS — J90 Pleural effusion, not elsewhere classified: Secondary | ICD-10-CM | POA: Diagnosis not present

## 2018-10-22 ENCOUNTER — Telehealth: Payer: Self-pay

## 2018-10-22 NOTE — Telephone Encounter (Signed)
Ok with me 

## 2018-10-22 NOTE — Telephone Encounter (Signed)
Received message from Ronnika requesting thoracentesis Friday afternoon.  Can we schedule a thoracentesis for Friday afternoon? I think I can hold out until then. Still doing pretty good.  Request sent to special scheduling. She has standing order. Surgery has been scheduled for 11/4 with Dr. Fransisca Connors at [facility].   Oncology Nurse Navigator Documentation  Navigator Location: [facility] (10/22/18 0900)   )Navigator Encounter Type: Letter/Fax/Email (10/22/18 0900)                                                    Time Spent with Patient: 15 (10/22/18 0900)

## 2018-10-23 ENCOUNTER — Telehealth: Payer: Self-pay

## 2018-10-23 NOTE — Telephone Encounter (Signed)
Called and notified Karen Mann of thoracentesis appointment details for 11/1 with arrival time of 11am. Read back performed. Oncology Nurse Navigator Documentation  Navigator Location: [facility] (10/23/18 1200)   )Navigator Encounter Type: Telephone (10/23/18 1200) Telephone: Karen Mann Call;Appt Confirmation/Clarification (10/23/18 1200)                                                  Time Spent with Patient: 15 (10/23/18 1200)

## 2018-10-23 NOTE — Discharge Instructions (Signed)
Thoracentesis, Care After °Refer to this sheet in the next few weeks. These instructions provide you with information about caring for yourself after your procedure. Your health care provider may also give you more specific instructions. Your treatment has been planned according to current medical practices, but problems sometimes occur. Call your health care provider if you have any problems or questions after your procedure. °What can I expect after the procedure? °After your procedure, it is common to have pain at the puncture site. °Follow these instructions at home: °· Take medicines only as directed by your health care provider. °· You may return to your normal diet and normal activities as directed by your health care provider. °· Drink enough fluid to keep your urine clear or pale yellow. °· Do not take baths, swim, or use a hot tub until your health care provider approves. °· Follow your health care provider's instructions about: °? Puncture site care. °? Bandage (dressing) changes and removal. °· Check your puncture site every day for signs of infection. Watch for: °? Redness, swelling, or pain. °? Fluid, blood, or pus. °· Keep all follow-up visits as directed by your health care provider. This is important. °Contact a health care provider if: °· You have redness, swelling, or pain at your puncture site. °· You have fluid, blood, or pus coming from your puncture site. °· You have a fever. °· You have chills. °· You have nausea or vomiting. °· You have trouble breathing. °· You develop a worsening cough. °Get help right away if: °· You have extreme shortness of breath. °· You develop chest pain. °· You faint or feel light-headed. °This information is not intended to replace advice given to you by your health care provider. Make sure you discuss any questions you have with your health care provider. °Document Released: 01/02/2015 Document Revised: 08/13/2016 Document Reviewed: 09/23/2014 °Elsevier  Interactive Patient Education © 2018 [employer] ° °

## 2018-10-25 DIAGNOSIS — C541 Malignant neoplasm of endometrium: Secondary | ICD-10-CM | POA: Diagnosis not present

## 2018-10-25 DIAGNOSIS — Z01818 Encounter for other preprocedural examination: Secondary | ICD-10-CM | POA: Diagnosis not present

## 2018-10-25 DIAGNOSIS — I2699 Other pulmonary embolism without acute cor pulmonale: Secondary | ICD-10-CM | POA: Diagnosis not present

## 2018-10-26 ENCOUNTER — Other Ambulatory Visit: Payer: Self-pay | Admitting: Oncology

## 2018-10-26 ENCOUNTER — Other Ambulatory Visit (HOSPITAL_COMMUNITY): Payer: Self-pay | Admitting: Interventional Radiology

## 2018-10-26 ENCOUNTER — Ambulatory Visit
Admission: RE | Admit: 2018-10-26 | Discharge: 2018-10-26 | Disposition: A | Discharge status: Home / Self Care | Payer: 59 | Source: Ambulatory Visit | Attending: Interventional Radiology | Admitting: Interventional Radiology

## 2018-10-26 ENCOUNTER — Ambulatory Visit
Admission: RE | Admit: 2018-10-26 | Discharge: 2018-10-26 | Disposition: A | Discharge status: Home / Self Care | Payer: 59 | Source: Ambulatory Visit | Attending: Oncology | Admitting: Oncology

## 2018-10-26 DIAGNOSIS — C541 Malignant neoplasm of endometrium: Secondary | ICD-10-CM

## 2018-10-26 DIAGNOSIS — J90 Pleural effusion, not elsewhere classified: Secondary | ICD-10-CM

## 2018-10-26 DIAGNOSIS — R0602 Shortness of breath: Secondary | ICD-10-CM

## 2018-10-26 DIAGNOSIS — C801 Malignant (primary) neoplasm, unspecified: Secondary | ICD-10-CM | POA: Diagnosis not present

## 2018-10-26 DIAGNOSIS — J910 Malignant pleural effusion: Secondary | ICD-10-CM | POA: Diagnosis not present

## 2018-10-26 HISTORY — PX: ABDOMINAL HYSTERECTOMY: SHX81

## 2018-10-26 NOTE — Procedures (Signed)
Recurrent right effusion  S/p RT THORA  900 CC REMOVED No comp Stable Full report in pacs

## 2018-10-29 DIAGNOSIS — J90 Pleural effusion, not elsewhere classified: Secondary | ICD-10-CM | POA: Diagnosis not present

## 2018-10-29 DIAGNOSIS — R188 Other ascites: Secondary | ICD-10-CM | POA: Diagnosis not present

## 2018-10-29 DIAGNOSIS — C561 Malignant neoplasm of right ovary: Secondary | ICD-10-CM | POA: Diagnosis not present

## 2018-10-29 DIAGNOSIS — C541 Malignant neoplasm of endometrium: Secondary | ICD-10-CM | POA: Diagnosis not present

## 2018-10-29 DIAGNOSIS — N736 Female pelvic peritoneal adhesions (postinfective): Secondary | ICD-10-CM | POA: Diagnosis not present

## 2018-10-29 DIAGNOSIS — G8918 Other acute postprocedural pain: Secondary | ICD-10-CM | POA: Diagnosis not present

## 2018-11-05 ENCOUNTER — Telehealth: Payer: Self-pay

## 2018-11-05 NOTE — Telephone Encounter (Signed)
Called and spoke with Ms. Dutt. She is doing very well after surgery. Arranged for staple removal 11/13 at [facility]. Oncology Nurse Navigator Documentation  Navigator Location: [facility] (11/05/18 1400)   )Navigator Encounter Type: Telephone;Follow-up Appt (11/05/18 1400)                                                    Time Spent with Patient: 15 (11/05/18 1400)

## 2018-11-07 ENCOUNTER — Inpatient Hospital Stay: Payer: 59 | Attending: Obstetrics and Gynecology | Admitting: Obstetrics and Gynecology

## 2018-11-07 VITALS — BP 121/86 | HR 89 | Temp 97.3°F | Resp 18 | Ht 69.0 in | Wt 206.3 lb

## 2018-11-07 DIAGNOSIS — Z90710 Acquired absence of both cervix and uterus: Secondary | ICD-10-CM | POA: Insufficient documentation

## 2018-11-07 DIAGNOSIS — Z90722 Acquired absence of ovaries, bilateral: Secondary | ICD-10-CM | POA: Insufficient documentation

## 2018-11-07 DIAGNOSIS — C541 Malignant neoplasm of endometrium: Secondary | ICD-10-CM

## 2018-11-07 NOTE — Progress Notes (Signed)
Pt here after surgery at [facility]. She has irritation from pants over her sutures. She is here to get sutures removed. She has a little red around the site, no hot to touch, no drainage.

## 2018-11-07 NOTE — Progress Notes (Signed)
Gynecologic Oncology Progress Note   Referring Provider:  Dr. Gilman Schmidt  Chief Complaint: Endometrioid adenocarcinoma  Subjective:  Karen Mann is a 57 y.o. female, initially seen in consultation for Dr. Nechama Guard, diagnosed with stage IIIA grade 3 endometrioid adenocarcinoma, returns to GYN oncology clinic today for follow-up and staple removal.   She returns today for staple removal and is doing well. No complaints.    Oncology History She was seen in GYN oncology clinic and reported continual vaginal bleeding for past 5-6 months fluctuating from light to heavy (saturating tampon every hour).  She was found to be significantly anemic and started on iron pills.  She was also having significant dyspnea.   Patient initially referred to Dr. Gilman Schmidt by PCP, Dr. Brita Romp, d/t large abdominal mass seen on pelvic ultrasound concerning for ovarian cancer. Patient first noticed bloating, fatigue, and constipation in July 2019. She presented to PCP. FSH 6.4, LH 5.8.   07/23/18- Ultrasound was performed which showed: pelvic mass measuring 20 x 13 x 22 cm with cystic and solid components. Ultrasound also showed: 4-5cm fibroid and thickened endometrium of 9m  08/05/18- MR PELVIS W WO CONTRAST - 20.6 cm (17.1 x 20.6 x 20.0 cm) complex cystic/solid mass in the pelvis w/ numerous thickened/enhancing septations, compatible with malignant ovarian neoplasm. Neither ovary is discretely visualized. - Small volume abdominopelvic ascites. Gross peritoneal disease is not visualized but cannot be excluded. - Left hydroureteronephrosis secondary to extrinsic compression. - 5.3 intramural anterior uterine body fibroid  On exam, Dr. SGilman Schmidtnoted bilateral lower extremity swelling with mass extending close to sternum, ~ 30 cm. Endometrial biopsy was performed. Pathology: Endometrioid Adenocarcinoma consistent with FIGO grade 3  CA 125  08/06/2018 622.0  08/09/18- CT C/A/P-imaging showed a large cystic/solid mass  measuring 28 x 11.6 x 21.4 cm suspicious for ovarian malignancy along with moderate ascites, large right pleural effusion, and moderate left and mild right hydronephrosis attributed to ureteral compression due to mass.  08/15/2018-initiated carboplatin AUC 6 and paclitaxel 175 mg/m.  Ca 125- 692  09/05/2018-cycle 2 carboplatin AUC 6 and paclitaxel 175 mg/m.  [id number]  Mild hypersensitivity to paclitaxel with chest tightness.  Taxol. Solu-Medrol and Benadryl given along with fluid bolus.  Challenged and patient was able to tolerate treatment at slower rate.  09/21/2018-planned for hysterectomy, however, patient diagnosed with PE/DVT.  CT chest PE protocol- 1.  Bilateral pulmonary emboli, most centrally in the bilateral main pulmonary arteries.  No evidence of right heart strain or pulmonary infarct. 2.  Moderate right pleural effusion 3.  Partially visualized left hydronephrosis  UKoreabilateral lower extremities revealed 1.  Deep vein thrombus in the right popliteal vein, thrombus extends into the right tibioperoneal trunk and proximal and mid portions of the right peroneal veins. 2.  Deep vein thrombus in the left common femoral vein and in the left popliteal vein. 3.  Occlusive thrombus in seen on in the left greater saphenous vein, superficial vein.  Echo revealed LVEF 55%.   09/26/2018-underwent placement of IVC filter in IR.  She was started on therapeutic Lovenox.   She proceeded with cycle 3 carboplatin AUC 6 and paclitaxel 175 mg/m with Neulasta support.  Ca125- 521  10/19/2018-CT C/A/P 1. Redemonstrated large cystic and solid mass measuring similarly in size. 2. Improved left hydronephrosis. 3.  Improved ascites with redemonstration of a moderate right pleural effusion.  Redemonstrated large cystic solid pelvic mass, dorsal to the uterus and inseparable from the ovaries, measuring approximately 22.8 x 21.9 x 12.3 cm  previously measuring 21.6 x 21.2 x 14.8 cm.  Slightly improved ascites.  She  underwent palliative thoracentesis on 10/26/2018, 10/11/18, 10/02/18, 09/19/18, 09/10/18, 08/30/18, 08/22/18, 08/16/18, 08/10/18. Pleural fluid was suspicious for malignancy on 10/02/18 with rare atypical PAX-8 and MOC-31 positive cells present.   10/29/18- patient underwent exploratory laparotomy, total abdominal hysterectomy, bilateral salpingo-oophorectomy, omentectomy, and right ureterolysis. EBL 500 ml and cancer diagnosis.  She received 2 units of PRBCs during her admission postoperatively. Surgery at [facility]. Dr. Fransisca Connors, attending.   Pathology:  -Abdominal fluid-negative.  No evidence of malignancy. Other surgical pathology is pending.   Genetic Testing- Microsatellite stable, TMB low (3) -Foundation One PD-L1 - TPS 0% PTEN, TP53, PIK3CA mutations  Family History: She reports her mother died of ovarian cancer at age 95 (diagnosed at age 77).   Problem List: Patient Active Problem List   Diagnosis Date Noted  . S/P insertion of IVC (inferior vena caval) filter 09/26/2018  . Pulmonary embolism ([place]) 09/22/2018  . DVT (deep venous thrombosis) ([place]) 09/22/2018  . Anxiety associated with cancer diagnosis 09/07/2018  . Recurrent pleural effusion on right 09/07/2018  . Lower extremity edema 09/07/2018  . Chemotherapy-induced peripheral neuropathy ([place]) 09/07/2018  . Iron deficiency anemia due to chronic blood loss 08/10/2018  . Goals of care, counseling/discussion 08/10/2018  . Endometrial adenocarcinoma ([place]) 08/10/2018  . Pelvic mass 08/08/2018  . Abnormal uterine bleeding (AUB) 07/18/2018  . Generalized abdominal pain 07/18/2018  . Chest pain 12/03/2015  . Breathlessness on exertion 12/03/2015  . Arthritis 12/01/2015  . Secondary pulmonary hypertension 11/24/2015  . Allergic rhinitis 03/02/2010  . Varicose veins of lower extremities with inflammation 01/06/2010  . Chronic venous insufficiency 01/06/2010  . Nonrheumatic tricuspid valve disorder 12/30/2009  . Cyst of ovary 12/09/2009  .  Fibroid 12/09/2009  . Family history of malignant neoplasm of ovary 12/02/2009    Past Medical History: Past Medical History:  Diagnosis Date  . Allergy   . Anemia 07/20/2018  . Endometrial adenocarcinoma ([place]) 07/2018  . Fibroid uterus 07/24/2018   5cm/2 inch   . Ovarian mass 07/24/2018   22cm/10 inch  . Pelvic mass     Past Surgical History: Past Surgical History:  Procedure Laterality Date  . EYE SURGERY  2000   Lasik  . PORTA CATH INSERTION N/A 08/13/2018   Procedure: PORTA CATH INSERTION;  Surgeon: Algernon Huxley, MD;  Location: [facility];  Service: Cardiovascular;  Laterality: N/A;    Past Gynecologic History:  G1P1 Age of Menarche: 37 Regular menstrual periods Denies abnormal pap smears Denies history of STDs  OB History:  OB History  Gravida Para Term Preterm AB Living  1 1 1     1   SAB TAB Ectopic Multiple Live Births          1    # Outcome Date GA Lbr Len/2nd Weight Sex Delivery Anes PTL Lv  1 Term 09/03/85 [redacted]w[redacted]d 9 lb 2.5 oz (4.153 kg) M Vag-Spont   LIV    Family History: Family History  Problem Relation Age of Onset  . Hypertension Father   . Ovarian cancer Mother 530      deceased 568 . Healthy Brother   . Skin cancer Paternal Grandmother        deceased 786s . Cancer Paternal Grandfather        unk. primary; deceased 662s . Ovarian cancer Other        mat grandmother's sister    Social  History: Social History   Socioeconomic History  . Marital status: Married    Spouse name: Not on file  . Number of children: Not on file  . Years of education: Not on file  . Highest education level: Not on file  Occupational History  . Not on file  Social Needs  . Financial resource strain: Not on file  . Food insecurity:    Worry: Not on file    Inability: Not on file  . Transportation needs:    Medical: Not on file    Non-medical: Not on file  Tobacco Use  . Smoking status: Never Smoker  . Smokeless tobacco: Never Used   Substance and Sexual Activity  . Alcohol use: No  . Drug use: No  . Sexual activity: Not Currently    Birth control/protection: Post-menopausal  Lifestyle  . Physical activity:    Days per week: Not on file    Minutes per session: Not on file  . Stress: Not on file  Relationships  . Social connections:    Talks on phone: Not on file    Gets together: Not on file    Attends religious service: Not on file    Active member of club or organization: Not on file    Attends meetings of clubs or organizations: Not on file    Relationship status: Not on file  . Intimate partner violence:    Fear of current or ex partner: Not on file    Emotionally abused: Not on file    Physically abused: Not on file    Forced sexual activity: Not on file  Other Topics Concern  . Not on file  Social History Narrative  . Not on file    Allergies: Allergies  Allergen Reactions  . Paclitaxel Shortness Of Breath    Chest tightening with second round chemo  . Penicillins     Current Medications: Current Outpatient Medications  Medication Sig Dispense Refill  . acetaminophen (TYLENOL) 325 MG tablet Take by mouth. Take 650 mg by mouth every 4 (four) hours as needed for Pain    . ALPRAZolam (XANAX) 0.25 MG tablet Take 1 tablet (0.25 mg total) by mouth at bedtime as needed for anxiety or sleep. 20 tablet 0  . gabapentin (NEURONTIN) 300 MG capsule Take 1 capsule (300 mg total) by mouth at bedtime. 30 capsule 0  . ibuprofen (ADVIL,MOTRIN) 200 MG tablet Take 600 mg by mouth every 6 (six) hours as needed.    . lidocaine-prilocaine (EMLA) cream Apply to affected area once 30 g 3  . ondansetron (ZOFRAN) 8 MG tablet Take 1 tablet (8 mg total) by mouth 2 (two) times daily as needed for refractory nausea / vomiting. Start on day 3 after chemo. 30 tablet 1  . Polyethylene Glycol 3350 (MIRALAX PO) Take 17 g by mouth daily as needed.     . sennosides-docusate sodium (SENOKOT-S) 8.6-50 MG tablet Take 2 tablets by  mouth daily.    Marland Kitchen dexamethasone (DECADRON) 4 MG tablet Take 2 tablets (8 mg total) by mouth See admin instructions. 2 days before and 2 days after chemotherapy (Patient not taking: Reported on 11/07/2018) 30 tablet 1  . furosemide (LASIX) 20 MG tablet Take 1 tablet (20 mg total) by mouth daily as needed for fluid. (Patient not taking: Reported on 09/05/2018) 30 tablet 0  . oxyCODONE (ROXICODONE) 5 MG immediate release tablet Take 1 tablet (5 mg total) by mouth every 6 (six) hours as needed for moderate pain,  severe pain or breakthrough pain. (Patient not taking: Reported on 11/07/2018) 10 tablet 0  . potassium chloride SA (K-DUR,KLOR-CON) 20 MEQ tablet Take 1 tablet (20 mEq total) by mouth daily as needed (take on the days you take Lasix.). (Patient not taking: Reported on 10/04/2018) 60 tablet 0  . prochlorperazine (COMPAZINE) 10 MG tablet Take 1 tablet (10 mg total) by mouth every 6 (six) hours as needed (Nausea or vomiting). (Patient not taking: Reported on 11/07/2018) 30 tablet 1  . rOPINIRole (REQUIP) 0.25 MG tablet Take 1 tablet (0.25 mg total) by mouth at bedtime. Take 1-3 hours before bedtime (Patient not taking: Reported on 09/26/2018) 30 tablet 3   No current facility-administered medications for this visit.    Review of Systems General:  no complaints Skin: no complaints Eyes: no complaints HEENT: no complaints Breasts: no complaints Pulmonary: no complaints Cardiac: no complaints Gastrointestinal: no complaints Genitourinary/Sexual: no complaints Ob/Gyn: no complaints Musculoskeletal: no complaints Hematology: no complaints Neurologic/Psych: no complaints   Objective:  Physical Examination:  BP 121/86   Pulse 89   Temp (!) 97.3 F (36.3 C) (Tympanic)   Resp 18   Ht 5' 9"  (1.753 m)   Wt 206 lb 4.8 oz (93.6 kg)   BMI 30.47 kg/m     ECOG Performance Status: 1 - Symptomatic but completely ambulatory  GENERAL: Patient is a well appearing female in no acute distress.  Accompanied by husband.  HEENT:  PERRL, neck supple with midline trachea. Thyroid without masses.  NODES:  No cervical, supraclavicular, axillary, or inguinal lymphadenopathy palpated.  LUNGS:  Clear to auscultation bilaterally.  No wheezes or rhonchi. HEART:  Regular rate and rhythm. No murmur appreciated. ABDOMEN:  Soft, nontender.  Positive, normoactive bowel sounds. Staples removed and steri strips placed in well healing incision.  MSK:  No focal spinal tenderness to palpation. Full range of motion bilaterally in the upper extremities.  EXTREMITIES:  No peripheral edema.   SKIN:  Clear with no obvious rashes or skin changes. No nail dyscrasia. NEURO:  Nonfocal. Well oriented.  Appropriate affect.  Pelvic:deferred     Assessment:  Karen Mann is a 57 y.o. female diagnosed with Stage IIIA grade 3 endometrioid endometrial adenocarcinoma with pleural effusions that required multiple thoracentesis and were suspicious for cancer.  Now s/p 3 cycles of carbo/taxol chemotherapy and then developed pulmonary emboli and these were treated with Lovonox and IVC filter.  Then underwent TAH, BSO 10/29/18 for removal of uterus and very large right ovarian metastasis that was adherent to the pelvic structures. No evidence of carcinomatosis. Healing well from surgery.   Mother died of ovarian cancer at age 91.  Plan:   Problem List Items Addressed This Visit      Genitourinary   Endometrial adenocarcinoma ([place]) - Primary      She is healing well from surgery.  Will plan 3 additional cycles of chemotherapy starting in 2 weeks. Will await final pathology report and present in tumor board to determine whether to add radiation therapy when chemotherapy completed in view of adherence of large ovarian mass to pelvic structures.     Mellody Drown, MD  CC:  Brita Romp [address], [address] [place], [place] [address] [phone]

## 2018-11-14 ENCOUNTER — Encounter: Payer: Self-pay | Admitting: Oncology

## 2018-11-14 ENCOUNTER — Telehealth: Payer: Self-pay

## 2018-11-14 DIAGNOSIS — C541 Malignant neoplasm of endometrium: Secondary | ICD-10-CM

## 2018-11-14 NOTE — Telephone Encounter (Signed)
Dr. Fransisca Connors has reviewed pathology from 10/29/18 surgery. He would like 3 more cycles of chemo and referral to radiation. I have called and discussed pathology with Ms. Pease. We will provide copy of final pathology for her. All questions answered.   Pathology - General / Other: [id number] Order: [id number] Collected: 10/29/2018 19:46 Status: Final result   Visible to patient: No (Not Released) Dx: Endometrial cancer (CMS-HCC) Component  DIAGNOSIS A. Mesenteric nodule, excision:   Benign inclusion with transitional type epithelium. Negative for malignancy.      B. Right pelvic peritoneum, excision:   Mesothelial-lined fibromuscular and adipose tissue. Negative for malignancy.      C. Appendices epiploica, excision:   Metastatic adenocarcinoma.     D. Omentum, omentectomy:    Omentum. Negative for malignancy.      E. Uterus, bilateral ovaries and fallopian tubes, hysterectomy and bilateral salpingo-oophorectomy:    Endometrioid adenocarcinoma of the endometrium. See note FIGO grade: 3 of 3. Tumor size: 3.5 cm Depth of invasion: 3 mm in a 37 mm thick myometrium Cervical involvement: Not identified  Adnexal involvement: Metastatic carcinoma involves bilateral ovaries. Lymphovascular invasion: Not identified    See synoptic report.   Microsatellite instability testing has been ordered on block E5.   Note: The residual carcinoma in the uterus is present in a background of abundant macrophages and lymphocytes, consistent with a partial response to therapy. No significant response to therapy is identified in the tumor in the ovaries. The tumor grade is based on the combination of architectural and cytologic features.    Electronically signed by Adam Phenix, MD on 11/13/2018 at 35 Synoptic Report ENDOMETRIUMEndometrium - All Specimens SPECIMEN Procedure  Total hysterectomy and bilateral salpingo-oophorectomy Omentectomy Peritoneal biopsies Specimen  Integrity  Received disrupted TUMOR Tumor Site  Fundus Histologic Type  Endometrioid carcinoma, NOS Histologic Grade  FIGO grade 3 Tumor Size  Greatest dimension in Centimeters (cm): 3.5 Centimeters (cm) Tumor Extent   Myometrial Invasion  Present Depth of Invasion in Millimeters (mm)  3 Millimeters (mm) Myometrial Thickness in Millimeters (mm)  37 Millimeters (mm) Percentage of Myometrial Invasion  8 % Adenomyosis  Present, involved by carcinoma Uterine Serosa Involvement  Not identified Lower Uterine Segment Involvement  Not identified Cervical Stromal Involvement  Not identified Other Tissue / Organ Involvement  Right ovary Left ovary Appendices epiploica Accessory Findings   Lymphovascular Invasion  Not identified LYMPH NODES Regional Lymph Nodes  No lymph nodes submitted or found PATHOLOGIC STAGE CLASSIFICATION (pTNM, AJCC 8th Edition) Primary Tumor (pT)  pT3a Regional Lymph Nodes (pN)   Category (pN)  pNX Distant Metastasis (pM)  pM1 Site(s)  Appendices epiploica ADDITIONAL FINDINGS Additional Pathologic Findings  Uterine leiomyomata . Clinical Information  Endometrial cancer    Per [facility]:  Endometrial biopsy from outside hospital exhibits high-grade Mullerian adenocarcinoma, consistent with FIGO grade 3 endometrioid adenocarcinoma on 09/24/18.  CT on 10/19/18 demonstrates a large cystic solid pelvic mass.  Gross Examination  A. "Mesenteric nodule", received fresh and placed in formalin on 10/30/18 at 7:30 is a 0.5 x 0.4 x 0.2 cm aggregate of three pale tan-white soft tissue fragments.  Two fragments are pale-tan firm nodules, measuring 0.2 and 0.3 cm in greatest dimension.  The specimen is submitted entirely in block A1.   B. "Right pelvic perimetrium", received fresh and placed in formalin on 10/30/18 at 7:30 is a 2.8 x 2.6 x 1.2 cm aggregate of two tan-brown fibroadipose tissue fragments, focal cautery is noted.  Sectioning reveals a 0.3 x  0.3 x 0.2 cm  well-circumscribed, tan-yellow and possibly calcified nodule.  Representative sections are submitted as follows:   Block Summary: B1-2 - representative sections B3 - calcified nodule, submitted entirely, following decalcification   C. "Appendices aplocia", received fresh and placed in formalin on 10/30/18 at 7:30 is a 3.1 x 2.4 x 1.7 cm aggregate of three tan-brown soft tissue fragments, with attached lobulated adipose tissue, foci of cautery is noted.  There is a 0.6 x 0.5 x 0.3 cm loosely adherent pale tan soft tissue fragment.  Representative sections are submitted as follows:   Block Summary: C1-3 - representative sections         C1- contains pale tan fragment    D. "Omentum", received fresh and placed in formalin on 10/30/18 at 7:40 is a 25 x 20.7 x 1.5 cm yellow-tan fibroadipose tissue fragment, with foci of hemorrhage.  No masses or lesions are grossly identified.  Representative sections are submitted in blocks D1-5.     E. "Pelvic mass", received fresh and placed in formalin on 10/30/18 at 7:40.   Procedure:  Simple hysterectomy; BSO Specimen integrity:  Disrupted  Weight:  300 grams Height of uterus:  11.5 cm Breadth of uterus at fundus:  8.0 cm Anterior-posterior width:  7.5 cm Serosa:  Smooth, tan, with minimal adhesions. Anterior is inked blue, posterior is inked black.  Length of endometrial cavity:  6.5 cm Cornu-to-cornu width of endometrial cavity:  3.5 cm   Tumor size:  3.5 x 2.5 x 0.6 cm Tumor description:  Tan-yellow, flattened and flaky. Tumor site:  Fundus  Myometrial invasion:  0.6 cm   Myometrial thickness:  Ranges from 2.5 to 5.0 cm Remaining myometrium:  There are two well-circumscribed, firm and nodular pale-tan leiomyomata with a range of 0.6 - 4.0 cm.  Additionally, there are moderate pale tan trabeculations.  Adjacent non-neoplastic endometrium thickness:  0.1 cm Adjacent non-neoplastic endometrium description:  There is a 4.0 x 1.8 x 1.3 cm firm, tan  polyp.  The remainder of the endometrium is tan and grossly unremarkable.    Diameter of cervix:  4.3 x 3.5 cm Diameter of external os:  1.2 cm Shape of os:  Round  Endocervix and cervix:  Tan, with a normal herringbone pattern, grossly unremarkable.    Right adnexa: Dimensions of right ovary:  23 x 20 x 5.5 cm, 1080 grams, received disrupted Gross findings:  The outer surface is tan-brown and variegated with moderate adhesions and is diffusely lobulated.  The wall thickness is less than 0.1 cm to 1.1 cm.  The contents include multiloculated cystic structures, with tan-yellow solid, gelatinous, and serosanguinous contents and hemorrhagic debris. Length of right fallopian tube:  5.0 cm Diameter of right fallopian tube:  0.9 cm Gross findings:  The fallopian tube and fimbria are adherent to the ovary wall.  The tube has multiple paratubal cysts, ranging from 0.3 to 0.8 cm in greatest dimension.  Left adnexa: Dimensions of left ovary:  2.8 x 2.3 x 1.1 cm Gross findings:  Cut section reveals normal ovarian parenchyma with a peripheral 0.8 x 0.7 x 0.5 cm pale tan nodule. Length of left fallopian tube:  5.0 cm Diameter of left fallopian tube:  0.5 cm Gross findings:  Multiple paratubal cysts, ranging from 0.1 to 0.7 cm in greatest dimension with yellow gelatinous contents.    Special studies prospectively ordered:  No Photograph:  No Block diagram:  No Representative tissue from this case was provided to the [facility]?:  Yes Tumor submitted entirely:  No Endometrium submitted entirely:  No   Block Summary: E1 - longitudinal transmural section of anterior cervix/endocervix E2 - longitudinal transmural section of anterior lower uterine segment E3 - longitudinal transmural section of posterior cervix/endocervix E4 - longitudinal transmural section of posterior lower uterine segment E5-6 - tumor - transmural section demonstrating deepest invasion of anterior  endomyometrium, bisected E7-8 - tumor - transmural section demonstrating deepest invasion of posterior endomyometrium, full thickness E9-15 - polyp, submitted entirely     E9-10 - base of polyp    E11 - base of polyp/polyp E16 - representative leiomyomata  E17 - representative left ovary with nodule E18 - left fallopian tube - central section and paratubal cyst with longitudinal fimbria E19 - right fallopian tube - central section and paratubal cyst with longitudinal fimbria E20 - cross section of fallopian tube adherent to ovarian wall  E21-40 - representative right ovarian mass   K. Blair/[place]/Dr. Jeris Penta      Decalcification performed on block(s):   B3     Microscopic Examination  Microscopic examination is performed. Immunohistochemistry  The endometrial tumor (E5) and the ovarian tumor (E24) have similar immunoprofiles. They are positive for AMACR, ER (moderate, 60-70%), and PR (moderate-strong, 70-80%). They have little to no staining for Napsin and HNF1-beta. Staining for p16 is patchy in some areas, strong and diffuse in others. Staining for p53 is variable, with focally stronger staining in the endometrial tumor.   These findings are consistent with a high grade endometrioid tumor.  Disclaimer  All immunohistochemical and in situ hybridization tests performed at [facility] and reported herein were developed, validated and their performance characteristics determined by the, [facility]. During the performance of these tests, appropriate positive and negative control slides are also performed and reviewed. All control slides and internal controls (when applicable) demonstrate the expected immunoreactive patterns and/or nucleic acid hybridization. Some of the tests may not be cleared or approved by the U[place]. Food and Drug Administration (FDA).  The FDA has determined that such clearance or approval is not necessary.  These tests are used for  clinical purposes and should not be regarded as investigational or as research.  This laboratory is certified under the [place] (CLIA) as qualified to perform high complexity clinical testing. Attestation  All of the diagnostic evaluations on the enumerated specimens have been personally conducted by the pathologists involved in the care of this patient as indicated by the electronic signatures above. Oncology Nurse Navigator Documentation  Navigator Location: [facility] (11/14/18 1500)   )Navigator Encounter Type: Telephone (11/14/18 1500) Telephone: Diagnostic Results (11/14/18 1500)                                                  Time Spent with Patient: 15 (11/14/18 1500)

## 2018-11-21 ENCOUNTER — Encounter: Payer: Self-pay | Admitting: Oncology

## 2018-11-21 ENCOUNTER — Inpatient Hospital Stay (HOSPITAL_BASED_OUTPATIENT_CLINIC_OR_DEPARTMENT_OTHER): Payer: 59 | Admitting: Oncology

## 2018-11-21 ENCOUNTER — Inpatient Hospital Stay: Payer: 59

## 2018-11-21 ENCOUNTER — Telehealth: Payer: Self-pay | Admitting: Pharmacist

## 2018-11-21 ENCOUNTER — Other Ambulatory Visit: Payer: Self-pay

## 2018-11-21 VITALS — BP 113/76 | HR 74 | Temp 97.2°F | Resp 18 | Wt 205.0 lb

## 2018-11-21 DIAGNOSIS — C541 Malignant neoplasm of endometrium: Secondary | ICD-10-CM

## 2018-11-21 DIAGNOSIS — I2699 Other pulmonary embolism without acute cor pulmonale: Secondary | ICD-10-CM | POA: Diagnosis not present

## 2018-11-21 DIAGNOSIS — E876 Hypokalemia: Secondary | ICD-10-CM

## 2018-11-21 DIAGNOSIS — Z90710 Acquired absence of both cervix and uterus: Secondary | ICD-10-CM | POA: Diagnosis not present

## 2018-11-21 DIAGNOSIS — I82493 Acute embolism and thrombosis of other specified deep vein of lower extremity, bilateral: Secondary | ICD-10-CM | POA: Diagnosis not present

## 2018-11-21 DIAGNOSIS — Z90722 Acquired absence of ovaries, bilateral: Secondary | ICD-10-CM | POA: Diagnosis not present

## 2018-11-21 DIAGNOSIS — Z79899 Other long term (current) drug therapy: Secondary | ICD-10-CM

## 2018-11-21 DIAGNOSIS — G620 Drug-induced polyneuropathy: Secondary | ICD-10-CM

## 2018-11-21 LAB — CBC WITH DIFFERENTIAL/PLATELET
ABS IMMATURE GRANULOCYTES: 0.01 10*3/uL (ref 0.00–0.07)
BASOS PCT: 1 %
Basophils Absolute: 0.1 10*3/uL (ref 0.0–0.1)
Eosinophils Absolute: 0.1 10*3/uL (ref 0.0–0.5)
Eosinophils Relative: 1 %
HEMATOCRIT: 32.6 % — AB (ref 36.0–46.0)
HEMOGLOBIN: 10.4 g/dL — AB (ref 12.0–15.0)
IMMATURE GRANULOCYTES: 0 %
LYMPHS ABS: 3 10*3/uL (ref 0.7–4.0)
LYMPHS PCT: 43 %
MCH: 28.2 pg (ref 26.0–34.0)
MCHC: 31.9 g/dL (ref 30.0–36.0)
MCV: 88.3 fL (ref 80.0–100.0)
MONOS PCT: 6 %
Monocytes Absolute: 0.4 10*3/uL (ref 0.1–1.0)
NEUTROS ABS: 3.4 10*3/uL (ref 1.7–7.7)
Neutrophils Relative %: 49 %
PLATELETS: 297 10*3/uL (ref 150–400)
RBC: 3.69 MIL/uL — ABNORMAL LOW (ref 3.87–5.11)
RDW: 13.6 % (ref 11.5–15.5)
WBC: 6.9 10*3/uL (ref 4.0–10.5)
nRBC: 0 % (ref 0.0–0.2)

## 2018-11-21 LAB — COMPREHENSIVE METABOLIC PANEL
ALBUMIN: 4.1 g/dL (ref 3.5–5.0)
ALT: 16 U/L (ref 0–44)
ANION GAP: 6 (ref 5–15)
AST: 18 U/L (ref 15–41)
Alkaline Phosphatase: 50 U/L (ref 38–126)
BUN: 20 mg/dL (ref 6–20)
CO2: 26 mmol/L (ref 22–32)
Calcium: 9.5 mg/dL (ref 8.9–10.3)
Chloride: 108 mmol/L (ref 98–111)
Creatinine, Ser: 0.71 mg/dL (ref 0.44–1.00)
GFR calc Af Amer: >60 mL/min (ref >60)
GFR calc non Af Amer: >60 mL/min (ref >60)
GLUCOSE: 93 mg/dL (ref 70–99)
POTASSIUM: 3.4 mmol/L — AB (ref 3.5–5.1)
SODIUM: 140 mmol/L (ref 135–145)
Total Bilirubin: 0.5 mg/dL (ref 0.3–1.2)
Total Protein: 7 g/dL (ref 6.5–8.1)

## 2018-11-21 MED ORDER — ONDANSETRON HCL 8 MG PO TABS: 8.0000 mg | ORAL_TABLET | Freq: Two times a day (BID) | ORAL | 1 refills | Status: DC | PRN

## 2018-11-21 MED ORDER — PEGFILGRASTIM 6 MG/0.6ML ~~LOC~~ PSKT
6.0000 mg | PREFILLED_SYRINGE | Freq: Once | SUBCUTANEOUS | Status: AC
  Administered 2018-11-21: 6 mg via SUBCUTANEOUS
  Filled 2018-11-21: qty 0.6

## 2018-11-21 MED ORDER — SODIUM CHLORIDE 0.9 % IV SOLN
175.0000 mg/m2 | Freq: Once | INTRAVENOUS | Status: AC
  Administered 2018-11-21: 402 mg via INTRAVENOUS
  Filled 2018-11-21: qty 67

## 2018-11-21 MED ORDER — SODIUM CHLORIDE 0.9 % IV SOLN
900.0000 mg | Freq: Once | INTRAVENOUS | Status: AC
  Administered 2018-11-21: 900 mg via INTRAVENOUS
  Filled 2018-11-21: qty 90

## 2018-11-21 MED ORDER — POTASSIUM CHLORIDE CRYS ER 20 MEQ PO TBCR: 20.0000 meq | EXTENDED_RELEASE_TABLET | Freq: Every day | ORAL | 0 refills | Status: DC

## 2018-11-21 MED ORDER — PALONOSETRON HCL INJECTION 0.25 MG/5ML
0.2500 mg | Freq: Once | INTRAVENOUS | Status: AC
  Administered 2018-11-21: 0.25 mg via INTRAVENOUS
  Filled 2018-11-21: qty 5

## 2018-11-21 MED ORDER — APIXABAN 5 MG PO TABS: 5.0000 mg | ORAL_TABLET | Freq: Two times a day (BID) | ORAL | 3 refills | Status: DC

## 2018-11-21 MED ORDER — GABAPENTIN 300 MG PO CAPS: 600.0000 mg | ORAL_CAPSULE | Freq: Every day | ORAL | 3 refills | Status: DC

## 2018-11-21 MED ORDER — DEXAMETHASONE 4 MG PO TABS: 8.0000 mg | ORAL_TABLET | ORAL | 1 refills | Status: DC

## 2018-11-21 MED ORDER — SODIUM CHLORIDE 0.9 % IV SOLN
Freq: Once | INTRAVENOUS | Status: AC
  Administered 2018-11-21: 10:00:00 via INTRAVENOUS
  Filled 2018-11-21: qty 5

## 2018-11-21 MED ORDER — SODIUM CHLORIDE 0.9 % IV SOLN
20.0000 mg | Freq: Once | INTRAVENOUS | Status: AC
  Administered 2018-11-21: 20 mg via INTRAVENOUS
  Filled 2018-11-21: qty 2

## 2018-11-21 MED ORDER — FAMOTIDINE IN NACL 20-0.9 MG/50ML-% IV SOLN
20.0000 mg | Freq: Once | INTRAVENOUS | Status: AC
  Administered 2018-11-21: 20 mg via INTRAVENOUS
  Filled 2018-11-21: qty 50

## 2018-11-21 MED ORDER — DIPHENHYDRAMINE HCL 50 MG/ML IJ SOLN
50.0000 mg | Freq: Once | INTRAMUSCULAR | Status: AC
  Administered 2018-11-21: 50 mg via INTRAVENOUS
  Filled 2018-11-21: qty 1

## 2018-11-21 MED ORDER — SODIUM CHLORIDE 0.9 % IV SOLN
Freq: Once | INTRAVENOUS | Status: AC
  Administered 2018-11-21: 10:00:00 via INTRAVENOUS
  Filled 2018-11-21: qty 250

## 2018-11-21 MED ORDER — HEPARIN SOD (PORK) LOCK FLUSH 100 UNIT/ML IV SOLN
500.0000 [IU] | Freq: Once | INTRAVENOUS | Status: AC
  Administered 2018-11-21: 500 [IU] via INTRAVENOUS
  Filled 2018-11-21: qty 5

## 2018-11-21 MED ORDER — SODIUM CHLORIDE 0.9% FLUSH
10.0000 mL | Freq: Once | INTRAVENOUS | Status: AC
  Administered 2018-11-21: 10 mL via INTRAVENOUS
  Filled 2018-11-21: qty 10

## 2018-11-21 MED ORDER — PROCHLORPERAZINE MALEATE 10 MG PO TABS: 10.0000 mg | ORAL_TABLET | Freq: Four times a day (QID) | ORAL | 1 refills | Status: DC | PRN

## 2018-11-21 NOTE — Progress Notes (Signed)
Hematology/Oncology follow up  note [facility] Telephone:([phone] Fax:(336) [phone]   Patient Care Team: Virginia Crews, MD as PCP - General (Family Medicine) Clent Jacks, RN as Registered Nurse  REFERRING PROVIDER: Lewis VISIT Follow up for management of  endometrial cancer.   HISTORY OF PRESENTING ILLNESS:  Karen Mann is a  57 y.o.  female with PMH listed below who was referred to me for evaluation of newly diagnosed endometrial cancer. Patient was recently referred to GYN Dr. Gilman Schmidt for abnormal uterine bleeding. 07/23/2018 ultrasound was performed showed large pelvic mass 20 x 13 x 22 cm with cystic and solid components.  Ultrasound also showed 4.9 cm fibroid and thickened endometrium 15 mm thickness 08/05/2018 MRI pelvis with and without contrast 20.6 cm complex cystic solid mass in the pelvis with numerous thickened enhancing septations, compatible with malignant ovarian neoplasm.  Neither ovary was discretely visualized.  Small volume of ascites.  Cross paratonia disease is not visualized but not excluded.  Left hydro-ureteronephrosis secondary to extrinsic compression. 5.3 intramural anterior uterine body fibroid.  #Endometrial biopsy was performed.  Pathology showed endometrioid adenocarcinoma consistent with FIGO grade 3.  8/12/19CA125 was elevated at 622 Patient was referred to see Dr. Fransisca Connors.  She was evaluated by Dr. Meredith Mody on 08/08/2018.  Staging CT was obtained. #Images independently reviewed by me.   Staging CT showed large cystic and a solid mass likely arising from the ovaries, measures 369 0 cc, suspicious for ovarian malignancy.  Moderate ascites noted.  Raising concern for malignant peritoneal spread.  No well-defined omental caking or solid tumor deposits along a situs., -Large right pleural effusion over half of right hemithorax volume. -Liver hypodensity 2.8 x 2.8 cm -Moderate left and mild right  hydronephrosis attributed to the ureteral compression due to mass. -Other chronic image findings.  MRI pelvis with and without contrast again showed 20.6 cm complex cystic/solid mass in the pelvis.  Compatible with malignant ovarian neoplasm.  Small volume of a situs.  Cross peritoneal disease is not visualized but cannot be excluded.  Left hydroureteronephrosis secondary to compression.  #Patient was scheduled to have a diagnostic and therapeutic thoracentesis this morning.  Cytology pending. She feels breathing is much better after the thoracentesis. Mild shortness of breath, worse with exertion.  Heart palpitation as well denies any chest pain, abdominal pain. Ongoing vaginal bleeding, she passed a big clot today. Feel extremely fatigued. She feels overwhelming and very nervous about the cancer diagnosis.  She has insomnia and she has tried melatonin which did not help with the sleep.  # Dr. Fransisca Connors recommends systemic therapy with carboplatin and paclitaxel 3 cycles, reassessment for surgical evaluation.per Dr.Berchuck, not candidate for Pembrolizumab and Levetinib trial.   # 08/10/2018 thoracentesis  with 1.2 L fluid removed.  Cytology negative for malignancy. 08/16/2018 status post thoracentesis again and removed 2.2 L of mildly bloody fluid.  I discussed with Dr. Dicie Beam and she reviewed the slides there was no malignancy cells. Pleural fluid LDH 246, albumin 2.5, protein 4.2 Serum LDH 270.  Protein level 6.4 08/22/2018, another thoracentesis both liters of fluid.  Cytology negative.  Patient's case was discussed on GYN tumor conference.  Dr. Theora Gianotti recommends NexGen sequence molecular testing.  And also check for PDL 1.  # CT PE study showed bilateral pulmonary embolism with right heart strain..  Lower extremity venous Doppler showed bilateral DVT  1. Deep vein thrombus in the right popliteal vein; thrombus extends into the right tibioperoneal trunk  and proximal and mid portions of the  right peroneal veins. 2. Deep vein thrombus in the left common femoral vein and in the left popliteal vein. 3. Occlusive thrombus is seen in the left greater saphenous vein, a superficial vein.  Patient was admitted at [facility] and started on heparin drip.  Discharged on Lovenox 100 mg subcutaneous twice daily. Debulking surgery was delayed due to PE and DVT  INTERVAL HISTORY Karen Mann is a 57 y.o. female with oncology history reviewed by me today presents for assessment prior to adjuvant chemotherapy treatment for endometrial cancer.. Status post 3 cycles of neoadjuvant chemotherapy with carboplatin and Taxol.Foundation 1 report showed TPS 0%.  10/29/2018 patient underwent debulking procedure by Dr. Fransisca Connors. Pathology showed A. Mesenteric nodule, excision:Benign inclusion with transitional type epithelium.Negative for malignancy.  B. Right pelvic peritoneum, excision:Mesothelial-lined fibromuscular and adipose tissue.Negative for malignancy.  C. Appendices epiploica, excision:Metastatic adenocarcinoma D. Omentum, omentectomy: Omentum.Negative for malignancy.  E. Uterus, bilateral ovaries and fallopian tubes, hysterectomy and bilateral salpingo-oophorectomy:  Endometrioid adenocarcinoma of the endometrium. See note FIGO grade: 3 of 3. Tumor size: 3.5 cm Depth of invasion: 3 mm in a 37 mm thick myometrium Cervical involvement: Not identified  Adnexal involvement: Metastatic carcinoma involves bilateral ovaries. Lymphovascular invasion: Not identified   Microsatellite instability testing has been ordered on block E5. Note: The residual carcinoma in the uterus is present in a background of abundant macrophages and lymphocytes, consistent with a partial response to therapy. No significant response to therapy is identified in the tumor in the ovaries. The tumor grade is based on the combination of architectural and cytologic features.  SPECIMEN   Procedure:  Total hysterectomy and  bilateral salpingo-oophorectomy    Procedure:  Omentectomy    Procedure:  Peritoneal biopsies    Specimen Integrity:  Received disrupted  TUMOR   Tumor Site:  Fundus    Histologic Type:  Endometrioid carcinoma, NOS    Histologic Grade:  FIGO grade 3    Tumor Size:  Greatest dimension in Centimeters (cm): 3.5 Centimeters (cm)   Tumor Extent:      Myometrial Invasion:  Present      Depth of Invasion in Millimeters (mm):  3 Millimeters (mm)     Myometrial Thickness in Millimeters (mm):  37 Millimeters (mm)     Percentage of Myometrial Invasion:  8 %    Adenomyosis:  Present, involved by carcinoma     Uterine Serosa Involvement:  Not identified     Lower Uterine Segment Involvement:  Not identified     Cervical Stromal Involvement:  Not identified     Other Tissue / Organ Involvement:  Right ovary     Other Tissue / Organ Involvement:  Left ovary     Other Tissue / Organ Involvement:  Appendices epiploica    Accessory Findings:      Lymphovascular Invasion:  Not identified  Margins LYMPH NODES   Regional Lymph Nodes:  No lymph nodes submitted or found  PATHOLOGIC STAGE CLASSIFICATION (pTNM, AJCC 8th Edition)   Primary Tumor (pT):  pT3a    Regional Lymph Nodes (pN):      Category (pN):  pNX    Distant Metastasis (pM):  pM1     Site(s):  Appendices epiploica  ADDITIONAL FINDINGS   Additional Pathologic Findings:  Uterine leiomyomata    Since surgery, patient reports feeling well.  Shortness of breath has improved.  She reports intermittent right lower quadrant pain, usually triggered after she walks a long distance or  after picking up her 26-year-old granddaughter.  She knows that she is not supposed to do heavy lifting right after abdominal surgery. Denies any fever, chills, chest pain,.  #Bilateral lower extremity DVT, s/p IVC filter.  Lower extremity swelling has improved as well.   She is currently on Lovenox 1 mg/kg twice a day.  She has 4 more days supply.  She canceled her appointment with [place] hematology. #Chronic fatigue, improved.  Review of Systems  Constitutional: Positive for malaise/fatigue. Negative for chills, fever and weight loss.  HENT: Negative for nosebleeds and sore throat.   Eyes: Negative for double vision, photophobia and redness.  Respiratory: Negative for cough, shortness of breath and wheezing.   Cardiovascular: Negative for chest pain, palpitations, orthopnea and leg swelling.  Gastrointestinal: Positive for abdominal pain. Negative for blood in stool, nausea and vomiting.  Genitourinary: Negative for dysuria.  Musculoskeletal: Negative for back pain, myalgias and neck pain.       Chronic back pain  Skin: Negative for itching and rash.  Neurological: Negative for dizziness, tingling and tremors.  Endo/Heme/Allergies: Negative for environmental allergies. Does not bruise/bleed easily.  Psychiatric/Behavioral: Negative for depression and hallucinations. The patient is not nervous/anxious.     MEDICAL HISTORY:  Past Medical History:  Diagnosis Date  . Allergy   . Anemia 07/20/2018  . Endometrial adenocarcinoma ([place]) 07/2018  . Fibroid uterus 07/24/2018   5cm/2 inch   . Ovarian mass 07/24/2018   22cm/10 inch  . Pelvic mass     SURGICAL HISTORY: Past Surgical History:  Procedure Laterality Date  . EYE SURGERY  2000   Lasik  . PORTA CATH INSERTION N/A 08/13/2018   Procedure: PORTA CATH INSERTION;  Surgeon: Algernon Huxley, MD;  Location: [place] [facility];  Service: Cardiovascular;  Laterality: N/A;    SOCIAL HISTORY: Social History   Socioeconomic History  . Marital status: Married    Spouse name: Not on file  . Number of children: Not on file  . Years of education: Not on file  . Highest education level: Not on file  Occupational History  . Not on file  Social Needs  . Financial resource strain: Not on file  . Food  insecurity:    Worry: Not on file    Inability: Not on file  . Transportation needs:    Medical: Not on file    Non-medical: Not on file  Tobacco Use  . Smoking status: Never Smoker  . Smokeless tobacco: Never Used  Substance and Sexual Activity  . Alcohol use: No  . Drug use: No  . Sexual activity: Not Currently    Birth control/protection: Post-menopausal  Lifestyle  . Physical activity:    Days per week: Not on file    Minutes per session: Not on file  . Stress: Not on file  Relationships  . Social connections:    Talks on phone: Not on file    Gets together: Not on file    Attends religious service: Not on file    Active member of club or organization: Not on file    Attends meetings of clubs or organizations: Not on file    Relationship status: Not on file  . Intimate partner violence:    Fear of current or ex partner: Not on file    Emotionally abused: Not on file    Physically abused: Not on file    Forced sexual activity: Not on file  Other Topics Concern  . Not on file  Social History Narrative  . Not on file    FAMILY HISTORY: Family History  Problem Relation Age of Onset  . Hypertension Father   . Ovarian cancer Mother 44       deceased 14  . Healthy Brother   . Skin cancer Paternal Grandmother        deceased 35s  . Cancer Paternal Grandfather        unk. primary; deceased 52s  . Ovarian cancer Other        mat grandmother's sister    ALLERGIES:  is allergic to paclitaxel and penicillins.  MEDICATIONS:  Current Outpatient Medications  Medication Sig Dispense Refill  . acetaminophen (TYLENOL) 325 MG tablet Take by mouth. Take 650 mg by mouth every 4 (four) hours as needed for Pain    . ALPRAZolam (XANAX) 0.25 MG tablet Take 1 tablet (0.25 mg total) by mouth at bedtime as needed for anxiety or sleep. 20 tablet 0  . dexamethasone (DECADRON) 4 MG tablet Take 2 tablets (8 mg total) by mouth See admin instructions. 2 days before and 2 days after  chemotherapy 30 tablet 1  . furosemide (LASIX) 20 MG tablet Take 1 tablet (20 mg total) by mouth daily as needed for fluid. 30 tablet 0  . gabapentin (NEURONTIN) 300 MG capsule Take 2 capsules (600 mg total) by mouth at bedtime. 60 capsule 3  . lidocaine-prilocaine (EMLA) cream Apply to affected area once 30 g 3  . ondansetron (ZOFRAN) 8 MG tablet Take 1 tablet (8 mg total) by mouth 2 (two) times daily as needed for refractory nausea / vomiting. Start on day 3 after chemo. 30 tablet 1  . oxyCODONE (ROXICODONE) 5 MG immediate release tablet Take 1 tablet (5 mg total) by mouth every 6 (six) hours as needed for moderate pain, severe pain or breakthrough pain. 10 tablet 0  . Polyethylene Glycol 3350 (MIRALAX PO) Take 17 g by mouth daily as needed.     . potassium chloride SA (K-DUR,KLOR-CON) 20 MEQ tablet Take 1 tablet (20 mEq total) by mouth daily. 30 tablet 0  . prochlorperazine (COMPAZINE) 10 MG tablet Take 1 tablet (10 mg total) by mouth every 6 (six) hours as needed (Nausea or vomiting). 30 tablet 1  . rOPINIRole (REQUIP) 0.25 MG tablet Take 1 tablet (0.25 mg total) by mouth at bedtime. Take 1-3 hours before bedtime 30 tablet 3  . sennosides-docusate sodium (SENOKOT-S) 8.6-50 MG tablet Take 2 tablets by mouth daily.    Marland Kitchen apixaban (ELIQUIS) 5 MG TABS tablet Take 1 tablet (5 mg total) by mouth 2 (two) times daily. 60 tablet 3   No current facility-administered medications for this visit.    Facility-Administered Medications Ordered in Other Visits  Medication Dose Route Frequency Provider Last Rate Last Dose  . CARBOplatin (PARAPLATIN) 900 mg in sodium chloride 0.9 % 500 mL chemo infusion  900 mg Intravenous Once Earlie Server, MD      . dexamethasone (DECADRON) 20 mg in sodium chloride 0.9 % 50 mL IVPB  20 mg Intravenous Once Earlie Server, MD 208 mL/hr at 11/21/18 1010 20 mg at 11/21/18 1010  . fosaprepitant (EMEND) 150 mg, dexamethasone (DECADRON) 12 mg in sodium chloride 0.9 % 145 mL IVPB   Intravenous  Once Earlie Server, MD      . heparin lock flush 100 unit/mL  500 Units Intravenous Once Earlie Server, MD      . PACLitaxel (TAXOL) 402 mg in sodium chloride 0.9 % 500 mL chemo infusion (>  42m/m2)  175 mg/m2 (Treatment Plan Recorded) Intravenous Once YEarlie Server MD      . pegfilgrastim (NEULASTA ONPRO KIT) injection 6 mg  6 mg Subcutaneous Once YEarlie Server MD         PHYSICAL EXAMINATION: ECOG PERFORMANCE STATUS: 1 - Symptomatic but completely ambulatory Vitals:   11/21/18 0846  BP: 113/76  Pulse: 74  Resp: 18  Temp: (!) 97.2 F (36.2 C)  SpO2: 98%   Filed Weights   11/21/18 0846  Weight: 205 lb (93 kg)    Physical Exam  Constitutional: She is oriented to person, place, and time. No distress.  HENT:  Head: Normocephalic and atraumatic.  Mouth/Throat: Oropharynx is clear and moist.  Eyes: Pupils are equal, round, and reactive to light. EOM are normal. No scleral icterus.  Neck: Normal range of motion. Neck supple.  Cardiovascular: Normal rate, regular rhythm and normal heart sounds.  Pulmonary/Chest: Effort normal and breath sounds normal. No respiratory distress. She has no wheezes.  Abdominal: Soft. Bowel sounds are normal. She exhibits no distension and no mass. There is no tenderness.  Well-healed abdominal scar.  No surrounding erythema or discharge.  Musculoskeletal: Normal range of motion. She exhibits no deformity.  Bilateral trace pitting edema.  Neurological: She is alert and oriented to person, place, and time. No cranial nerve deficit. Coordination normal.  Skin: Skin is warm and dry. No rash noted. No erythema.  Psychiatric: She has a normal mood and affect. Her behavior is normal. Thought content normal.     LABORATORY DATA:  I have reviewed the data as listed Lab Results  Component Value Date   WBC 6.9 11/21/2018   HGB 10.4 (L) 11/21/2018   HCT 32.6 (L) 11/21/2018   MCV 88.3 11/21/2018   PLT 297 11/21/2018   Recent Labs    09/26/18 0822 10/04/18 1336  11/21/18 0801  NA 141 137 140  K 3.7 3.9 3.4*  CL 108 105 108  CO2 _0 GLUCOSE 98 120* 93  BUN _1 CREATININE 0.82 0.65 0.71  CALCIUM 9.3 9.0 9.5  GFRNONAA >60 >60 >60  GFRAA >60 >60 >60  PROT 6.9 6.6 7.0  ALBUMIN 3.4* 3.6 4.1  AST _2 ALT _3 ALKPHOS 80 70 50  BILITOT 0.4 0.4 0.5   Iron/TIBC/Ferritin/ %Sat    Component Value Date/Time   IRON 25 (L) 09/05/2018 0819   IRON 11 (L) 07/17/2018 1124   TIBC 245 (L) 09/05/2018 0819   TIBC 312 07/17/2018 1124   FERRITIN 323 (H) 09/05/2018 0819   FERRITIN 60 07/17/2018 1124   IRONPCTSAT 10 (L) 09/05/2018 0819   IRONPCTSAT 4 (LL) 07/17/2018 1124    RADIOGRAPHIC STUDIES: I have personally reviewed the radiological images as listed and agreed with the findings in the report. 08/24/1999 nineteen 2D echo showed LVEF 55 to 60%. 08/30/2018 CXR Interval reduction/resolution of right-sided pleural effusion post thoracentesis. No pneumothorax.  09/23/2018, UK[place]ower extremity venous bilaterally positive for bilateral DVT CT chest PE protocol showed bilateral pulmonary emboli, most centrally in the bilateral main pulmonary arteries.  There are associated with signs suggestive of right heart strain.  No evidence of pulmonary infarct.  Moderate right pleural effusion.  Partially visualized left hydronephrosis also present on prior CT.   ASSESSMENT & PLAN:  1. Endometrial adenocarcinoma ([place]    #  FIGO grade 3 endometrial adenocarcinoma, pT3a Nx pM1 Patient's case was discussed on [facility] tumor [facility].  Consensus was to continue with additional 3 cycles of carboplatin and paclitaxel.  Follow Ca1 25 for response.  Repeat imaging after finishing chemotherapy.  Can consider radiation at that time. Labs reviewed and discussed with patient.  Counts acceptable to proceed with cycle 4 carbo Taxol.  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 Two Variants of Uncertain Significance were detected: POLE c.4513C>G (p.Pro1505Ala) and RECQL4 c.1708C>T (p.Arg570Trp).  She has discussed findings with Dietitian.  She does not have features of NF.  Not meeting criteria for diagnosis of NF1.   #Bilateral PE and bilateral lower extremity DVT with right heart strain, switch to Eliquis 5 mg twice daily.  Advised patient to wait until her next dose of Lovenox to start Eliquis.  She voices understanding.  Prescription was sent to pharmacy and co-pay card was provided to patient. Patient has a lot of questions. We spent sufficient time to discuss many aspect of care, questions were answered to patient's satisfaction.  #Grade 1 therapy-induced neuropathy, has been using gabapentin 600 mg nightly.  Reports symptoms well controlled. Refills sent to pharmacy. #Mild hypokalemia, advised patient to start taking potassium chloride 20 mEq daily. The patient knows to call the clinic with any problems questions or concerns.  Return of visit: 3 weeks Total face to face encounter time for this patient visit was 40 min. >50% of the time was  spent in counseling and coordination of care.   Earlie Server, MD, PhD Hematology Oncology [facility] Pager- [phone] 11/21/2018

## 2018-11-21 NOTE — Telephone Encounter (Signed)
Oral Chemotherapy Pharmacist Encounter  Eliquis Copay Card: ID: [id number] BIN: [id number] Group: [id number] PCN: [id number]  Given to patient in clinic. A copy of the copay card will be scanned into his chart.   Darl Pikes, PharmD, BCPS Hematology/Oncology Clinical Pharmacist [facility] [phone]  11/21/2018 9:17 AM

## 2018-11-21 NOTE — Progress Notes (Signed)
Confirmed with Dr Tasia Catchings that patient is to get Dexamethasone/Emend and and additional 20mg  Decadron IV

## 2018-11-21 NOTE — Progress Notes (Signed)
Patient here today for follow up.  Patient c/o of constipation and some lower abdominal pain that is relieved by putting pressure with her hand on the area.

## 2018-11-22 LAB — CA 125: Cancer Antigen (CA) 125: 58.3 U/mL — ABNORMAL HIGH (ref 0.0–38.1)

## 2018-11-29 ENCOUNTER — Telehealth: Payer: Self-pay | Admitting: *Deleted

## 2018-11-29 NOTE — Telephone Encounter (Signed)
Prior Authorization required for Eliquis.    PA completed via covermymeds.  Eliquis approved, pharmacy notified.

## 2018-12-03 ENCOUNTER — Other Ambulatory Visit: Payer: Self-pay

## 2018-12-03 ENCOUNTER — Telehealth: Payer: Self-pay

## 2018-12-03 NOTE — Telephone Encounter (Signed)
Received message from Mrs. Heikkila:  Happy Monday. Hope you had a good weekend. I'm becoming that patient that calls about everything. Sorry. Saturday night I noticed my left leg was sore and tender. I thought I must have bruised it somehow. Sunday night I noticed there is a knot on the inside just above the knee. I guess I'm concerned because of the DVTs and just starting the Eliquis on Thursday. Finally, the question. Is there reason for the concern? Should I just watch it? Thanks for your help   Responded back to Mrs. Lambing:  Good morning, You typically cannot feel a blood clot by pressing on it. I do not think it is a reason for concern, but with your history of clots, I would call our triage nurse so she can talk to you further about it and make sure there is no need for concern. She will talk to Dr. Tasia Catchings and just make sure she does not want an U/S. Our triage nurse can be reached at [phone] option 3.     Oncology Nurse Navigator Documentation  Navigator Location: [facility] (12/03/18 1500)   )Navigator Encounter Type: Letter/Fax/Email (12/03/18 1500)                                                    Time Spent with Patient: 15 (12/03/18 1500)

## 2018-12-04 ENCOUNTER — Inpatient Hospital Stay: Payer: 59 | Attending: Oncology | Admitting: Oncology

## 2018-12-04 ENCOUNTER — Other Ambulatory Visit: Payer: Self-pay

## 2018-12-04 ENCOUNTER — Encounter: Payer: Self-pay | Admitting: Oncology

## 2018-12-04 ENCOUNTER — Telehealth: Payer: Self-pay | Admitting: *Deleted

## 2018-12-04 ENCOUNTER — Inpatient Hospital Stay: Payer: 59

## 2018-12-04 VITALS — BP 117/83 | HR 79 | Temp 95.3°F | Resp 18 | Wt 206.5 lb

## 2018-12-04 DIAGNOSIS — E876 Hypokalemia: Secondary | ICD-10-CM | POA: Insufficient documentation

## 2018-12-04 DIAGNOSIS — Z7901 Long term (current) use of anticoagulants: Secondary | ICD-10-CM | POA: Insufficient documentation

## 2018-12-04 DIAGNOSIS — Z86718 Personal history of other venous thrombosis and embolism: Secondary | ICD-10-CM

## 2018-12-04 DIAGNOSIS — I8002 Phlebitis and thrombophlebitis of superficial vessels of left lower extremity: Secondary | ICD-10-CM | POA: Diagnosis not present

## 2018-12-04 DIAGNOSIS — C541 Malignant neoplasm of endometrium: Secondary | ICD-10-CM

## 2018-12-04 DIAGNOSIS — Z79899 Other long term (current) drug therapy: Secondary | ICD-10-CM | POA: Diagnosis not present

## 2018-12-04 LAB — CBC WITH DIFFERENTIAL/PLATELET
ABS IMMATURE GRANULOCYTES: 0.14 10*3/uL — AB (ref 0.00–0.07)
Basophils Absolute: 0.1 10*3/uL (ref 0.0–0.1)
Basophils Relative: 1 %
Eosinophils Absolute: 0.2 10*3/uL (ref 0.0–0.5)
Eosinophils Relative: 2 %
HCT: 34.1 % — ABNORMAL LOW (ref 36.0–46.0)
Hemoglobin: 10.6 g/dL — ABNORMAL LOW (ref 12.0–15.0)
Immature Granulocytes: 1 %
Lymphocytes Relative: 19 %
Lymphs Abs: 2 10*3/uL (ref 0.7–4.0)
MCH: 27.8 pg (ref 26.0–34.0)
MCHC: 31.1 g/dL (ref 30.0–36.0)
MCV: 89.5 fL (ref 80.0–100.0)
Monocytes Absolute: 0.7 10*3/uL (ref 0.1–1.0)
Monocytes Relative: 7 %
NEUTROS ABS: 7.2 10*3/uL (ref 1.7–7.7)
NEUTROS PCT: 70 %
Platelets: 182 10*3/uL (ref 150–400)
RBC: 3.81 MIL/uL — ABNORMAL LOW (ref 3.87–5.11)
RDW: 14.5 % (ref 11.5–15.5)
WBC: 10.2 10*3/uL (ref 4.0–10.5)
nRBC: 0 % (ref 0.0–0.2)

## 2018-12-04 NOTE — Telephone Encounter (Signed)
Patient has a new tender"knot" on her left leg just above the knee  to the side that has been there since Saturday and is not going away. She is concerned because her anticoagulation medicine just changed to Eliquis and her H/O DVT. Please advise

## 2018-12-04 NOTE — Progress Notes (Signed)
Hematology/Oncology follow up  note [facility] Telephone:([phone] [phone]   Patient Care Team: Virginia Crews, MD as PCP - General (Family Medicine) Clent Jacks, RN as Registered Nurse  REFERRING PROVIDER: Lewis VISIT Follow up for management of  endometrial cancer.   HISTORY OF PRESENTING ILLNESS:  Karen Mann is a  57 y.o.  female with PMH listed below who was referred to me for evaluation of newly diagnosed endometrial cancer. Patient was recently referred to GYN Dr. Gilman Schmidt for abnormal uterine bleeding. 07/23/2018 ultrasound was performed showed large pelvic mass 20 x 13 x 22 cm with cystic and solid components.  Ultrasound also showed 4.9 cm fibroid and thickened endometrium 15 mm thickness 08/05/2018 MRI pelvis with and without contrast 20.6 cm complex cystic solid mass in the pelvis with numerous thickened enhancing septations, compatible with malignant ovarian neoplasm.  Neither ovary was discretely visualized.  Small volume of ascites.  Cross paratonia disease is not visualized but not excluded.  Left hydro-ureteronephrosis secondary to extrinsic compression. 5.3 intramural anterior uterine body fibroid.  #Endometrial biopsy was performed.  Pathology showed endometrioid adenocarcinoma consistent with FIGO grade 3.  8/12/19CA125 was elevated at 622 Patient was referred to see Dr. Fransisca Connors.  She was evaluated by Dr. Meredith Mody on 08/08/2018.  Staging CT was obtained. #Images independently reviewed by me.   Staging CT showed large cystic and a solid mass likely arising from the ovaries, measures 369 0 cc, suspicious for ovarian malignancy.  Moderate ascites noted.  Raising concern for malignant peritoneal spread.  No well-defined omental caking or solid tumor deposits along a situs., -Large right pleural effusion over half of right hemithorax volume. -Liver hypodensity 2.8 x 2.8 cm -Moderate left and mild right  hydronephrosis attributed to the ureteral compression due to mass. -Other chronic image findings.  MRI pelvis with and without contrast again showed 20.6 cm complex cystic/solid mass in the pelvis.  Compatible with malignant ovarian neoplasm.  Small volume of a situs.  Cross peritoneal disease is not visualized but cannot be excluded.  Left hydroureteronephrosis secondary to compression.  #Patient was scheduled to have a diagnostic and therapeutic thoracentesis this morning.  Cytology pending. She feels breathing is much better after the thoracentesis. Mild shortness of breath, worse with exertion.  Heart palpitation as well denies any chest pain, abdominal pain. Ongoing vaginal bleeding, she passed a big clot today. Feel extremely fatigued. She feels overwhelming and very nervous about the cancer diagnosis.  She has insomnia and she has tried melatonin which did not help with the sleep.  # Dr. Fransisca Connors recommends systemic therapy with carboplatin and paclitaxel 3 cycles, reassessment for surgical evaluation.per Dr.Berchuck, not candidate for Pembrolizumab and Levetinib trial.   # 08/10/2018 thoracentesis  with 1.2 L fluid removed.  Cytology negative for malignancy. 08/16/2018 status post thoracentesis again and removed 2.2 L of mildly bloody fluid.  I discussed with Dr. Dicie Beam and she reviewed the slides there was no malignancy cells. Pleural fluid LDH 246, albumin 2.5, protein 4.2 Serum LDH 270.  Protein level 6.4 08/22/2018, another thoracentesis both liters of fluid.  Cytology negative.  Patient's case was discussed on GYN tumor conference.  Dr. Theora Gianotti recommends NexGen sequence molecular testing.  And also check for PDL 1.  # CT PE study showed bilateral pulmonary embolism with right heart strain..  Lower extremity venous Doppler showed bilateral DVT  1. Deep vein thrombus in the right popliteal vein; thrombus extends into the right tibioperoneal trunk  and proximal and mid portions of the  right peroneal veins. 2. Deep vein thrombus in the left common femoral vein and in the left popliteal vein. 3. Occlusive thrombus is seen in the left greater saphenous vein, a superficial vein.  Patient was admitted at [facility] and started on heparin drip.  Discharged on Lovenox 100 mg subcutaneous twice daily. Debulking surgery was delayed due to PE and DVT  10/29/2018 patient underwent debulking procedure by Dr. Fransisca Connors. Pathology showed A. Mesenteric nodule, excision:Benign inclusion with transitional type epithelium.Negative for malignancy.  B. Right pelvic peritoneum, excision:Mesothelial-lined fibromuscular and adipose tissue.Negative for malignancy.  C. Appendices epiploica, excision:Metastatic adenocarcinoma D. Omentum, omentectomy: Omentum.Negative for malignancy.  E. Uterus, bilateral ovaries and fallopian tubes, hysterectomy and bilateral salpingo-oophorectomy:  Endometrioid adenocarcinoma of the endometrium. See note FIGO grade: 3 of 3. Tumor size: 3.5 cm Depth of invasion: 3 mm in a 37 mm thick myometrium Cervical involvement: Not identified  Adnexal involvement: Metastatic carcinoma involves bilateral ovaries. Lymphovascular invasion: Not identified   Microsatellite instability testing has been ordered on block E5. Note: The residual carcinoma in the uterus is present in a background of abundant macrophages and lymphocytes, consistent with a partial response to therapy. No significant response to therapy is identified in the tumor in the ovaries. The tumor grade is based on the combination of architectural and cytologic features.  SPECIMEN   Procedure:  Total hysterectomy and bilateral salpingo-oophorectomy    Procedure:  Omentectomy    Procedure:  Peritoneal biopsies    Specimen Integrity:  Received disrupted  TUMOR   Tumor Site:  Fundus    Histologic Type:  Endometrioid carcinoma, NOS    Histologic Grade:  FIGO grade 3    Tumor Size:  Greatest  dimension in Centimeters (cm): 3.5 Centimeters (cm)   Tumor Extent:      Myometrial Invasion:  Present      Depth of Invasion in Millimeters (mm):  3 Millimeters (mm)     Myometrial Thickness in Millimeters (mm):  37 Millimeters (mm)     Percentage of Myometrial Invasion:  8 %    Adenomyosis:  Present, involved by carcinoma     Uterine Serosa Involvement:  Not identified     Lower Uterine Segment Involvement:  Not identified     Cervical Stromal Involvement:  Not identified     Other Tissue / Organ Involvement:  Right ovary     Other Tissue / Organ Involvement:  Left ovary     Other Tissue / Organ Involvement:  Appendices epiploica    Accessory Findings:      Lymphovascular Invasion:  Not identified  Margins LYMPH NODES   Regional Lymph Nodes:  No lymph nodes submitted or found  PATHOLOGIC STAGE CLASSIFICATION (pTNM, AJCC 8th Edition)   Primary Tumor (pT):  pT3a    Regional Lymph Nodes (pN):      Category (pN):  pNX    Distant Metastasis (pM):  pM1     Site(s):  Appendices epiploica  ADDITIONAL FINDINGS   Additional Pathologic Findings:  Uterine leiomyomata     INTERVAL HISTORY Karen Mann is a 57 y.o. female with oncology history reviewed by me today presents for evaluation of " painful left lower extremity knot" which developed yesterday. Denies any trauma, weight bearing.  She was switched to Eliquis '5mg'$  BID since 11/29/2018. She is concerned about developing another clot.  Denies any lower extremity swelling, calf pain.   Review of Systems  Constitutional: Negative for chills, fever, malaise/fatigue and  weight loss.  HENT: Negative for sore throat.   Eyes: Negative for redness.  Respiratory: Negative for cough, shortness of breath and wheezing.   Cardiovascular: Negative for chest pain, palpitations and leg swelling.  Gastrointestinal: Negative for abdominal pain, blood in stool, nausea and  vomiting.  Genitourinary: Negative for dysuria.  Musculoskeletal: Negative for myalgias.  Skin: Negative for rash.  Neurological: Negative for dizziness, tingling and tremors.  Endo/Heme/Allergies: Does not bruise/bleed easily.  Psychiatric/Behavioral: Negative for hallucinations.    MEDICAL HISTORY:  Past Medical History:  Diagnosis Date  . Allergy   . Anemia 07/20/2018  . Endometrial adenocarcinoma ([place]) 07/2018  . Fibroid uterus 07/24/2018   5cm/2 inch   . Ovarian mass 07/24/2018   22cm/10 inch  . Pelvic mass     SURGICAL HISTORY: Past Surgical History:  Procedure Laterality Date  . EYE SURGERY  2000   Lasik  . PORTA CATH INSERTION N/A 08/13/2018   Procedure: PORTA CATH INSERTION;  Surgeon: Algernon Huxley, MD;  Location: [facility];  Service: Cardiovascular;  Laterality: N/A;    SOCIAL HISTORY: Social History   Socioeconomic History  . Marital status: Married    Spouse name: Not on file  . Number of children: Not on file  . Years of education: Not on file  . Highest education level: Not on file  Occupational History  . Not on file  Social Needs  . Financial resource strain: Not on file  . Food insecurity:    Worry: Not on file    Inability: Not on file  . Transportation needs:    Medical: Not on file    Non-medical: Not on file  Tobacco Use  . Smoking status: Never Smoker  . Smokeless tobacco: Never Used  Substance and Sexual Activity  . Alcohol use: No  . Drug use: No  . Sexual activity: Not Currently    Birth control/protection: Post-menopausal  Lifestyle  . Physical activity:    Days per week: Not on file    Minutes per session: Not on file  . Stress: Not on file  Relationships  . Social connections:    Talks on phone: Not on file    Gets together: Not on file    Attends religious service: Not on file    Active member of club or organization: Not on file    Attends meetings of clubs or organizations: Not on file    Relationship  status: Not on file  . Intimate partner violence:    Fear of current or ex partner: Not on file    Emotionally abused: Not on file    Physically abused: Not on file    Forced sexual activity: Not on file  Other Topics Concern  . Not on file  Social History Narrative  . Not on file    FAMILY HISTORY: Family History  Problem Relation Age of Onset  . Hypertension Father   . Ovarian cancer Mother 15       deceased 75  . Healthy Brother   . Skin cancer Paternal Grandmother        deceased 57s  . Cancer Paternal Grandfather        unk. primary; deceased 4s  . Ovarian cancer Other        mat grandmother's sister    ALLERGIES:  is allergic to paclitaxel and penicillins.  MEDICATIONS:  Current Outpatient Medications  Medication Sig Dispense Refill  . acetaminophen (TYLENOL) 325 MG tablet Take by mouth. Take  650 mg by mouth every 4 (four) hours as needed for Pain    . ALPRAZolam (XANAX) 0.25 MG tablet Take 1 tablet (0.25 mg total) by mouth at bedtime as needed for anxiety or sleep. 20 tablet 0  . apixaban (ELIQUIS) 5 MG TABS tablet Take 1 tablet (5 mg total) by mouth 2 (two) times daily. 60 tablet 3  . dexamethasone (DECADRON) 4 MG tablet Take 2 tablets (8 mg total) by mouth See admin instructions. 2 days before and 2 days after chemotherapy 30 tablet 1  . gabapentin (NEURONTIN) 300 MG capsule Take 2 capsules (600 mg total) by mouth at bedtime. 60 capsule 3  . lidocaine-prilocaine (EMLA) cream Apply to affected area once 30 g 3  . ondansetron (ZOFRAN) 8 MG tablet Take 1 tablet (8 mg total) by mouth 2 (two) times daily as needed for refractory nausea / vomiting. Start on day 3 after chemo. 30 tablet 1  . oxyCODONE (ROXICODONE) 5 MG immediate release tablet Take 1 tablet (5 mg total) by mouth every 6 (six) hours as needed for moderate pain, severe pain or breakthrough pain. 10 tablet 0  . Polyethylene Glycol 3350 (MIRALAX PO) Take 17 g by mouth daily as needed.     . potassium chloride  SA (K-DUR,KLOR-CON) 20 MEQ tablet Take 1 tablet (20 mEq total) by mouth daily. 30 tablet 0  . prochlorperazine (COMPAZINE) 10 MG tablet Take 1 tablet (10 mg total) by mouth every 6 (six) hours as needed (Nausea or vomiting). 30 tablet 1  . sennosides-docusate sodium (SENOKOT-S) 8.6-50 MG tablet Take 2 tablets by mouth daily.    . furosemide (LASIX) 20 MG tablet Take 1 tablet (20 mg total) by mouth daily as needed for fluid. (Patient not taking: Reported on 12/04/2018) 30 tablet 0  . rOPINIRole (REQUIP) 0.25 MG tablet Take 1 tablet (0.25 mg total) by mouth at bedtime. Take 1-3 hours before bedtime (Patient not taking: Reported on 12/04/2018) 30 tablet 3   No current facility-administered medications for this visit.      PHYSICAL EXAMINATION: ECOG PERFORMANCE STATUS: 1 - Symptomatic but completely ambulatory Vitals:   12/04/18 1140  BP: 117/83  Pulse: 79  Resp: 18  Temp: (!) 95.3 F (35.2 C)   Filed Weights   12/04/18 1140  Weight: 206 lb 8 oz (93.7 kg)    Physical Exam  Constitutional: She is oriented to person, place, and time. No distress.  HENT:  Head: Normocephalic and atraumatic.  Mouth/Throat: Oropharynx is clear and moist.  Eyes: Pupils are equal, round, and reactive to light. EOM are normal. No scleral icterus.  Neck: Normal range of motion. Neck supple.  Cardiovascular: Normal rate, regular rhythm and normal heart sounds.  Pulmonary/Chest: Effort normal and breath sounds normal. No respiratory distress. She has no wheezes.  Abdominal: Soft. Bowel sounds are normal. She exhibits no distension and no mass. There is no tenderness.  Musculoskeletal: Normal range of motion. She exhibits no edema or deformity.  Approximately 2x 2cm soft tissue nodule on the inner aspect of left thigh,, tender No calf tenderness.   Neurological: She is alert and oriented to person, place, and time. No cranial nerve deficit. Coordination normal.  Skin: Skin is warm and dry. No rash noted. No  erythema.  Psychiatric: She has a normal mood and affect. Her behavior is normal. Thought content normal.       LABORATORY DATA:  I have reviewed the data as listed Lab Results  Component Value Date   WBC  10.2 12/04/2018   HGB 10.6 (L) 12/04/2018   HCT 34.1 (L) 12/04/2018   MCV 89.5 12/04/2018   PLT 182 12/04/2018   Recent Labs    09/26/18 0822 10/04/18 1336 11/21/18 0801  NA 141 137 140  K 3.7 3.9 3.4*  CL 108 105 108  CO2 _0 GLUCOSE 98 120* 93  BUN _1 CREATININE 0.82 0.65 0.71  CALCIUM 9.3 9.0 9.5  GFRNONAA >60 >60 >60  GFRAA >60 >60 >60  PROT 6.9 6.6 7.0  ALBUMIN 3.4* 3.6 4.1  AST _2 ALT _3 ALKPHOS 80 70 50  BILITOT 0.4 0.4 0.5   Iron/TIBC/Ferritin/ %Sat    Component Value Date/Time   IRON 25 (L) 09/05/2018 0819   IRON 11 (L) 07/17/2018 1124   TIBC 245 (L) 09/05/2018 0819   TIBC 312 07/17/2018 1124   FERRITIN 323 (H) 09/05/2018 0819   FERRITIN 60 07/17/2018 1124   IRONPCTSAT 10 (L) 09/05/2018 0819   IRONPCTSAT 4 (LL) 07/17/2018 1124    RADIOGRAPHIC STUDIES: I have personally reviewed the radiological images as listed and agreed with the findings in the report. 08/24/1999 nineteen 2D echo showed LVEF 55 to 60%. 08/30/2018 CXR Interval reduction/resolution of right-sided pleural effusion post thoracentesis. No pneumothorax.  09/23/2018, [place] lower extremity venous bilaterally positive for bilateral DVT CT chest PE protocol showed bilateral pulmonary emboli, most centrally in the bilateral main pulmonary arteries.  There are associated with signs suggestive of right heart strain.  No evidence of pulmonary infarct.  Moderate right pleural effusion.  Partially visualized left hydronephrosis also present on prior CT.   ASSESSMENT & PLAN:  1. Thrombophlebitis of superficial veins of left lower extremity   2. Endometrial adenocarcinoma ([place])   3. Chronic anticoagulation   . # Discussed with patient, I think she most likely had  thrombophlebitis.  She is on chronic anticoagulation with Eliquis and no signs of recurrence of lower extremity deep vein thrombosis. Observation is recommended.  Check cbc to rule out thrombocytopenia induced hematoma given her recent chemotherapy.  Return of visit: keep her original appointment. Total face to face encounter time for this patient visit was 73mn. >50% of the time was  spent in counseling and coordination of care.  ZEarlie Server MD, PhD Hematology Oncology [facility] ([place]Pager- [phone]/09/2018

## 2018-12-04 NOTE — Progress Notes (Signed)
Pt here with complaints of tenderness and knot to left side of inner side of knee. This started Saturday. She started on Eliquis on Thursday.

## 2018-12-04 NOTE — Telephone Encounter (Signed)
Discussed with Dr. Tasia Catchings and Almyra Free. Dr. Tasia Catchings would like to add patient to her schedule.

## 2018-12-12 ENCOUNTER — Other Ambulatory Visit: Payer: Self-pay

## 2018-12-12 ENCOUNTER — Encounter: Payer: Self-pay | Admitting: Oncology

## 2018-12-12 ENCOUNTER — Ambulatory Visit: Payer: 59

## 2018-12-12 ENCOUNTER — Inpatient Hospital Stay: Payer: 59

## 2018-12-12 ENCOUNTER — Inpatient Hospital Stay (HOSPITAL_BASED_OUTPATIENT_CLINIC_OR_DEPARTMENT_OTHER): Payer: 59 | Admitting: Oncology

## 2018-12-12 VITALS — BP 109/73 | HR 63 | Temp 95.7°F | Ht 69.0 in | Wt 206.3 lb

## 2018-12-12 DIAGNOSIS — E876 Hypokalemia: Secondary | ICD-10-CM

## 2018-12-12 DIAGNOSIS — C541 Malignant neoplasm of endometrium: Secondary | ICD-10-CM

## 2018-12-12 DIAGNOSIS — Z5111 Encounter for antineoplastic chemotherapy: Secondary | ICD-10-CM

## 2018-12-12 DIAGNOSIS — Z7901 Long term (current) use of anticoagulants: Secondary | ICD-10-CM

## 2018-12-12 DIAGNOSIS — I8002 Phlebitis and thrombophlebitis of superficial vessels of left lower extremity: Secondary | ICD-10-CM

## 2018-12-12 DIAGNOSIS — Z86718 Personal history of other venous thrombosis and embolism: Secondary | ICD-10-CM

## 2018-12-12 DIAGNOSIS — T451X5A Adverse effect of antineoplastic and immunosuppressive drugs, initial encounter: Secondary | ICD-10-CM

## 2018-12-12 DIAGNOSIS — Z79899 Other long term (current) drug therapy: Secondary | ICD-10-CM

## 2018-12-12 DIAGNOSIS — G620 Drug-induced polyneuropathy: Secondary | ICD-10-CM

## 2018-12-12 LAB — CBC WITH DIFFERENTIAL/PLATELET
Abs Immature Granulocytes: 0.01 10*3/uL (ref 0.00–0.07)
Basophils Absolute: 0 10*3/uL (ref 0.0–0.1)
Basophils Relative: 1 %
EOS ABS: 0 10*3/uL (ref 0.0–0.5)
Eosinophils Relative: 0 %
HEMATOCRIT: 32 % — AB (ref 36.0–46.0)
Hemoglobin: 10.3 g/dL — ABNORMAL LOW (ref 12.0–15.0)
Immature Granulocytes: 0 %
Lymphocytes Relative: 47 %
Lymphs Abs: 3.1 10*3/uL (ref 0.7–4.0)
MCH: 28.1 pg (ref 26.0–34.0)
MCHC: 32.2 g/dL (ref 30.0–36.0)
MCV: 87.2 fL (ref 80.0–100.0)
Monocytes Absolute: 0.6 10*3/uL (ref 0.1–1.0)
Monocytes Relative: 8 %
Neutro Abs: 3 10*3/uL (ref 1.7–7.7)
Neutrophils Relative %: 44 %
Platelets: 204 10*3/uL (ref 150–400)
RBC: 3.67 MIL/uL — ABNORMAL LOW (ref 3.87–5.11)
RDW: 15.3 % (ref 11.5–15.5)
WBC: 6.7 10*3/uL (ref 4.0–10.5)
nRBC: 0 % (ref 0.0–0.2)

## 2018-12-12 LAB — COMPREHENSIVE METABOLIC PANEL
ALK PHOS: 56 U/L (ref 38–126)
ALT: 19 U/L (ref 0–44)
AST: 18 U/L (ref 15–41)
Albumin: 4.3 g/dL (ref 3.5–5.0)
Anion gap: 8 (ref 5–15)
BUN: 16 mg/dL (ref 6–20)
CO2: 25 mmol/L (ref 22–32)
Calcium: 9.4 mg/dL (ref 8.9–10.3)
Chloride: 106 mmol/L (ref 98–111)
Creatinine, Ser: 0.77 mg/dL (ref 0.44–1.00)
GFR calc Af Amer: >60 mL/min (ref >60)
GFR calc non Af Amer: >60 mL/min (ref >60)
Glucose, Bld: 85 mg/dL (ref 70–99)
Potassium: 3.6 mmol/L (ref 3.5–5.1)
SODIUM: 139 mmol/L (ref 135–145)
Total Bilirubin: 0.6 mg/dL (ref 0.3–1.2)
Total Protein: 7.1 g/dL (ref 6.5–8.1)

## 2018-12-12 MED ORDER — FAMOTIDINE IN NACL 20-0.9 MG/50ML-% IV SOLN
20.0000 mg | Freq: Once | INTRAVENOUS | Status: AC
  Administered 2018-12-12: 20 mg via INTRAVENOUS
  Filled 2018-12-12: qty 50

## 2018-12-12 MED ORDER — HEPARIN SOD (PORK) LOCK FLUSH 100 UNIT/ML IV SOLN: 500.0000 [IU] | Freq: Once | INTRAVENOUS | Status: DC | PRN

## 2018-12-12 MED ORDER — PALONOSETRON HCL INJECTION 0.25 MG/5ML
0.2500 mg | Freq: Once | INTRAVENOUS | Status: AC
  Administered 2018-12-12: 0.25 mg via INTRAVENOUS
  Filled 2018-12-12: qty 5

## 2018-12-12 MED ORDER — SODIUM CHLORIDE 0.9 % IV SOLN
900.0000 mg | Freq: Once | INTRAVENOUS | Status: AC
  Administered 2018-12-12: 900 mg via INTRAVENOUS
  Filled 2018-12-12: qty 90

## 2018-12-12 MED ORDER — SODIUM CHLORIDE 0.9 % IV SOLN
175.0000 mg/m2 | Freq: Once | INTRAVENOUS | Status: AC
  Administered 2018-12-12: 402 mg via INTRAVENOUS
  Filled 2018-12-12: qty 67

## 2018-12-12 MED ORDER — SODIUM CHLORIDE 0.9 % IV SOLN
Freq: Once | INTRAVENOUS | Status: AC
  Administered 2018-12-12: 10:00:00 via INTRAVENOUS
  Filled 2018-12-12: qty 250

## 2018-12-12 MED ORDER — SODIUM CHLORIDE 0.9 % IV SOLN
20.0000 mg | Freq: Once | INTRAVENOUS | Status: DC
  Filled 2018-12-12: qty 2

## 2018-12-12 MED ORDER — SODIUM CHLORIDE 0.9 % IV SOLN
Freq: Once | INTRAVENOUS | Status: AC
  Administered 2018-12-12: 11:00:00 via INTRAVENOUS
  Filled 2018-12-12: qty 5

## 2018-12-12 MED ORDER — POTASSIUM CHLORIDE CRYS ER 20 MEQ PO TBCR: 20.0000 meq | EXTENDED_RELEASE_TABLET | Freq: Every day | ORAL | 0 refills | Status: DC

## 2018-12-12 MED ORDER — DIPHENHYDRAMINE HCL 50 MG/ML IJ SOLN
50.0000 mg | Freq: Once | INTRAMUSCULAR | Status: AC
  Administered 2018-12-12: 50 mg via INTRAVENOUS
  Filled 2018-12-12: qty 1

## 2018-12-12 MED ORDER — PEGFILGRASTIM 6 MG/0.6ML ~~LOC~~ PSKT
6.0000 mg | PREFILLED_SYRINGE | Freq: Once | SUBCUTANEOUS | Status: AC
  Administered 2018-12-12: 6 mg via SUBCUTANEOUS
  Filled 2018-12-12: qty 0.6

## 2018-12-12 MED ORDER — SODIUM CHLORIDE 0.9% FLUSH
10.0000 mL | Freq: Once | INTRAVENOUS | Status: AC
  Administered 2018-12-12: 10 mL via INTRAVENOUS
  Filled 2018-12-12: qty 10

## 2018-12-12 MED ORDER — HEPARIN SOD (PORK) LOCK FLUSH 100 UNIT/ML IV SOLN
500.0000 [IU] | Freq: Once | INTRAVENOUS | Status: AC
  Administered 2018-12-12: 500 [IU] via INTRAVENOUS
  Filled 2018-12-12: qty 5

## 2018-12-12 MED ORDER — GABAPENTIN 300 MG PO CAPS: 900.0000 mg | ORAL_CAPSULE | Freq: Every day | ORAL | 3 refills | Status: DC

## 2018-12-12 NOTE — Progress Notes (Signed)
Hematology/Oncology follow up  note [facility] Telephone:([phone] [phone]   Patient Care Team: Virginia Crews, MD as PCP - General (Family Medicine) Clent Jacks, RN as Registered Nurse  REFERRING PROVIDER: Lewis VISIT Follow up for management of  endometrial cancer.   HISTORY OF PRESENTING ILLNESS:  Karen Mann is a  57 y.o.  female with PMH listed below who was referred to me for evaluation of newly diagnosed endometrial cancer. Patient was recently referred to GYN Dr. Gilman Schmidt for abnormal uterine bleeding. 07/23/2018 ultrasound was performed showed large pelvic mass 20 x 13 x 22 cm with cystic and solid components.  Ultrasound also showed 4.9 cm fibroid and thickened endometrium 15 mm thickness 08/05/2018 MRI pelvis with and without contrast 20.6 cm complex cystic solid mass in the pelvis with numerous thickened enhancing septations, compatible with malignant ovarian neoplasm.  Neither ovary was discretely visualized.  Small volume of ascites.  Cross paratonia disease is not visualized but not excluded.  Left hydro-ureteronephrosis secondary to extrinsic compression. 5.3 intramural anterior uterine body fibroid.  #Endometrial biopsy was performed.  Pathology showed endometrioid adenocarcinoma consistent with FIGO grade 3.  8/12/19CA125 was elevated at 622 Patient was referred to see Dr. Fransisca Connors.  She was evaluated by Dr. Meredith Mody on 08/08/2018.  Staging CT was obtained. #Images independently reviewed by me.   Staging CT showed large cystic and a solid mass likely arising from the ovaries, measures 369 0 cc, suspicious for ovarian malignancy.  Moderate ascites noted.  Raising concern for malignant peritoneal spread.  No well-defined omental caking or solid tumor deposits along a situs., -Large right pleural effusion over half of right hemithorax volume. -Liver hypodensity 2.8 x 2.8 cm -Moderate left and mild right  hydronephrosis attributed to the ureteral compression due to mass. -Other chronic image findings.  MRI pelvis with and without contrast again showed 20.6 cm complex cystic/solid mass in the pelvis.  Compatible with malignant ovarian neoplasm.  Small volume of a situs.  Cross peritoneal disease is not visualized but cannot be excluded.  Left hydroureteronephrosis secondary to compression.  #Patient was scheduled to have a diagnostic and therapeutic thoracentesis this morning.  Cytology pending. She feels breathing is much better after the thoracentesis. Mild shortness of breath, worse with exertion.  Heart palpitation as well denies any chest pain, abdominal pain. Ongoing vaginal bleeding, she passed a big clot today. Feel extremely fatigued. She feels overwhelming and very nervous about the cancer diagnosis.  She has insomnia and she has tried melatonin which did not help with the sleep.  # Dr. Fransisca Connors recommends systemic therapy with carboplatin and paclitaxel 3 cycles, reassessment for surgical evaluation.per Dr.Berchuck, not candidate for Pembrolizumab and Levetinib trial.   # 08/10/2018 thoracentesis  with 1.2 L fluid removed.  Cytology negative for malignancy. 08/16/2018 status post thoracentesis again and removed 2.2 L of mildly bloody fluid.  I discussed with Dr. Dicie Beam and she reviewed the slides there was no malignancy cells. Pleural fluid LDH 246, albumin 2.5, protein 4.2 Serum LDH 270.  Protein level 6.4 08/22/2018, another thoracentesis both liters of fluid.  Cytology negative.  Patient's case was discussed on GYN tumor conference.  Dr. Theora Gianotti recommends NexGen sequence molecular testing.  And also check for PDL 1.  # CT PE study showed bilateral pulmonary embolism with right heart strain..  Lower extremity venous Doppler showed bilateral DVT  1. Deep vein thrombus in the right popliteal vein; thrombus extends into the right tibioperoneal trunk  and proximal and mid portions of the  right peroneal veins. 2. Deep vein thrombus in the left common femoral vein and in the left popliteal vein. 3. Occlusive thrombus is seen in the left greater saphenous vein, a superficial vein.  Patient was admitted at [facility] and started on heparin drip.  Discharged on Lovenox 100 mg subcutaneous twice daily. Debulking surgery was delayed due to PE and DVT  10/29/2018 patient underwent debulking procedure by Dr. Fransisca Connors. Pathology showed A. Mesenteric nodule, excision:Benign inclusion with transitional type epithelium.Negative for malignancy.  B. Right pelvic peritoneum, excision:Mesothelial-lined fibromuscular and adipose tissue.Negative for malignancy.  C. Appendices epiploica, excision:Metastatic adenocarcinoma D. Omentum, omentectomy: Omentum.Negative for malignancy.  E. Uterus, bilateral ovaries and fallopian tubes, hysterectomy and bilateral salpingo-oophorectomy:  Endometrioid adenocarcinoma of the endometrium. See note FIGO grade: 3 of 3. Tumor size: 3.5 cm Depth of invasion: 3 mm in a 37 mm thick myometrium Cervical involvement: Not identified  Adnexal involvement: Metastatic carcinoma involves bilateral ovaries. Lymphovascular invasion: Not identified   Microsatellite instability testing has been ordered on block E5. Note: The residual carcinoma in the uterus is present in a background of abundant macrophages and lymphocytes, consistent with a partial response to therapy. No significant response to therapy is identified in the tumor in the ovaries. The tumor grade is based on the combination of architectural and cytologic features.  SPECIMEN   Procedure:  Total hysterectomy and bilateral salpingo-oophorectomy    Procedure:  Omentectomy    Procedure:  Peritoneal biopsies    Specimen Integrity:  Received disrupted  TUMOR   Tumor Site:  Fundus    Histologic Type:  Endometrioid carcinoma, NOS    Histologic Grade:  FIGO grade 3    Tumor Size:  Greatest  dimension in Centimeters (cm): 3.5 Centimeters (cm)   Tumor Extent:      Myometrial Invasion:  Present      Depth of Invasion in Millimeters (mm):  3 Millimeters (mm)     Myometrial Thickness in Millimeters (mm):  37 Millimeters (mm)     Percentage of Myometrial Invasion:  8 %    Adenomyosis:  Present, involved by carcinoma     Uterine Serosa Involvement:  Not identified     Lower Uterine Segment Involvement:  Not identified     Cervical Stromal Involvement:  Not identified     Other Tissue / Organ Involvement:  Right ovary     Other Tissue / Organ Involvement:  Left ovary     Other Tissue / Organ Involvement:  Appendices epiploica    Accessory Findings:      Lymphovascular Invasion:  Not identified  Margins LYMPH NODES   Regional Lymph Nodes:  No lymph nodes submitted or found  PATHOLOGIC STAGE CLASSIFICATION (pTNM, AJCC 8th Edition)   Primary Tumor (pT):  pT3a    Regional Lymph Nodes (pN):      Category (pN):  pNX    Distant Metastasis (pM):  pM1     Site(s):  Appendices epiploica  ADDITIONAL FINDINGS   Additional Pathologic Findings:  Uterine leiomyomata    #  Family history ovarian cancer and personal history of endometrial CA.Patient also has had genetic testing done which showed 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  She has discussed findings with Dietitian.  She does not have features of NF.  Not meeting criteria for diagnosis of NF1.  INTERVAL HISTORY Jennalee KIANNAH GRUNOW is a 57 y.o. female with oncology history  reviewed by me today for assessment prior to adjuvant chemotherapy with [place] and Taxol.  #She was seen by me on 12/04/2018 for evaluation of a painful left lower extremity superficial knot which was  considered to be thrombophlebitis.  Instructed patient to continue take Eliquis 5 mg twice daily. Warm compress._0 Today patient feels the knot has gotten smaller in size.  Tenderness has improved.  #Tolerated last chemotherapy well.  She had improved for G-CSF support.  Reports 1 day of bone pain which relieved by oxycodone.  #Chronic neuropathy, she has increased gabapentin to 900 mg at night as neuropathy bothers her the mostly during sleep.  Tolerates well.  Symptom is well controlled.   Today denies any pain.  Reports feeling well. Chronic fatigue at baseline.  No new complaints.  Review of Systems  Constitutional: Negative for chills, fever, malaise/fatigue and weight loss.  HENT: Negative for sore throat.   Eyes: Negative for redness.  Respiratory: Negative for cough, shortness of breath and wheezing.   Cardiovascular: Negative for chest pain, palpitations and leg swelling.  Gastrointestinal: Negative for abdominal pain, blood in stool, nausea and vomiting.  Genitourinary: Negative for dysuria.  Musculoskeletal: Negative for myalgias.  Skin: Negative for rash.  Neurological: Negative for dizziness, tingling and tremors.  Endo/Heme/Allergies: Does not bruise/bleed easily.  Psychiatric/Behavioral: Negative for hallucinations.    MEDICAL HISTORY:  Past Medical History:  Diagnosis Date  . Allergy   . Anemia 07/20/2018  . Endometrial adenocarcinoma ([place]) 07/2018  . Fibroid uterus 07/24/2018   5cm/2 inch   . Ovarian mass 07/24/2018   22cm/10 inch  . Pelvic mass     SURGICAL HISTORY: Past Surgical History:  Procedure Laterality Date  . EYE SURGERY  2000   Lasik  . PORTA CATH INSERTION N/A 08/13/2018   Procedure: PORTA CATH INSERTION;  Surgeon: Algernon Huxley, MD;  Location: [place] [facility];  Service: Cardiovascular;  Laterality: N/A;    SOCIAL HISTORY: Social History   Socioeconomic History  . Marital status: Married    Spouse name: Not on file  . Number of  children: Not on file  . Years of education: Not on file  . Highest education level: Not on file  Occupational History  . Not on file  Social Needs  . Financial resource strain: Not on file  . Food insecurity:    Worry: Not on file    Inability: Not on file  . Transportation needs:    Medical: Not on file    Non-medical: Not on file  Tobacco Use  . Smoking status: Never Smoker  . Smokeless tobacco: Never Used  Substance and Sexual Activity  . Alcohol use: No  . Drug use: No  . Sexual activity: Not Currently    Birth control/protection: Post-menopausal  Lifestyle  . Physical activity:    Days per week: Not on file    Minutes per session: Not on file  . Stress: Not on file  Relationships  . Social connections:    Talks on phone: Not on file    Gets together: Not on file    Attends religious service: Not on file    Active member of club or organization: Not on file    Attends meetings of clubs or organizations: Not on file    Relationship status: Not on file  . Intimate partner violence:    Fear of current or ex partner: Not on file    Emotionally abused: Not on file    Physically abused:  Not on file    Forced sexual activity: Not on file  Other Topics Concern  . Not on file  Social History Narrative  . Not on file    FAMILY HISTORY: Family History  Problem Relation Age of Onset  . Hypertension Father   . Ovarian cancer Mother 53       deceased 74  . Healthy Brother   . Skin cancer Paternal Grandmother        deceased 74s  . Cancer Paternal Grandfather        unk. primary; deceased 54s  . Ovarian cancer Other        mat grandmother's sister    ALLERGIES:  is allergic to paclitaxel and penicillins.  MEDICATIONS:  Current Outpatient Medications  Medication Sig Dispense Refill  . acetaminophen (TYLENOL) 325 MG tablet Take by mouth. Take 650 mg by mouth every 4 (four) hours as needed for Pain    . ALPRAZolam (XANAX) 0.25 MG tablet Take 1 tablet (0.25 mg  total) by mouth at bedtime as needed for anxiety or sleep. 20 tablet 0  . apixaban (ELIQUIS) 5 MG TABS tablet Take 1 tablet (5 mg total) by mouth 2 (two) times daily. 60 tablet 3  . dexamethasone (DECADRON) 4 MG tablet Take 2 tablets (8 mg total) by mouth See admin instructions. 2 days before and 2 days after chemotherapy 30 tablet 1  . furosemide (LASIX) 20 MG tablet Take 1 tablet (20 mg total) by mouth daily as needed for fluid. 30 tablet 0  . gabapentin (NEURONTIN) 300 MG capsule Take 2 capsules (600 mg total) by mouth at bedtime. 60 capsule 3  . lidocaine-prilocaine (EMLA) cream Apply to affected area once 30 g 3  . ondansetron (ZOFRAN) 8 MG tablet Take 1 tablet (8 mg total) by mouth 2 (two) times daily as needed for refractory nausea / vomiting. Start on day 3 after chemo. 30 tablet 1  . oxyCODONE (ROXICODONE) 5 MG immediate release tablet Take 1 tablet (5 mg total) by mouth every 6 (six) hours as needed for moderate pain, severe pain or breakthrough pain. 10 tablet 0  . Polyethylene Glycol 3350 (MIRALAX PO) Take 17 g by mouth daily as needed.     . potassium chloride SA (K-DUR,KLOR-CON) 20 MEQ tablet Take 1 tablet (20 mEq total) by mouth daily. 30 tablet 0  . prochlorperazine (COMPAZINE) 10 MG tablet Take 1 tablet (10 mg total) by mouth every 6 (six) hours as needed (Nausea or vomiting). 30 tablet 1  . rOPINIRole (REQUIP) 0.25 MG tablet Take 1 tablet (0.25 mg total) by mouth at bedtime. Take 1-3 hours before bedtime 30 tablet 3  . sennosides-docusate sodium (SENOKOT-S) 8.6-50 MG tablet Take 2 tablets by mouth daily.     No current facility-administered medications for this visit.    Facility-Administered Medications Ordered in Other Visits  Medication Dose Route Frequency Provider Last Rate Last Dose  . CARBOplatin (PARAPLATIN) 900 mg in sodium chloride 0.9 % 500 mL chemo infusion  900 mg Intravenous Once Earlie Server, MD      . heparin lock flush 100 unit/mL  500 Units Intravenous Once Earlie Server, MD      . heparin lock flush 100 unit/mL  500 Units Intracatheter Once PRN Earlie Server, MD      . PACLitaxel (TAXOL) 402 mg in sodium chloride 0.9 % 500 mL chemo infusion (> 24m/m2)  175 mg/m2 (Treatment Plan Recorded) Intravenous Once YEarlie Server MD 189 mL/hr at 12/12/18 1101 402  mg at 12/12/18 1101     PHYSICAL EXAMINATION: ECOG PERFORMANCE STATUS: 1 - Symptomatic but completely ambulatory Vitals:   12/12/18 0904  BP: 109/73  Pulse: 63  Temp: (!) 95.7 F (35.4 C)   Filed Weights   12/12/18 0904  Weight: 206 lb 5 oz (93.6 kg)    Physical Exam Constitutional:      General: She is not in acute distress. HENT:     Head: Normocephalic and atraumatic.  Eyes:     General: No scleral icterus.    Pupils: Pupils are equal, round, and reactive to light.  Neck:     Musculoskeletal: Normal range of motion and neck supple.  Cardiovascular:     Rate and Rhythm: Normal rate and regular rhythm.     Heart sounds: Normal heart sounds.  Pulmonary:     Effort: Pulmonary effort is normal. No respiratory distress.     Breath sounds: Normal breath sounds. No wheezing.  Abdominal:     General: Bowel sounds are normal. There is no distension.     Palpations: Abdomen is soft. There is no mass.     Tenderness: There is no abdominal tenderness.  Musculoskeletal: Normal range of motion.        General: No deformity.     Comments: Approximately 2x 2cm soft tissue nodule on the inner aspect of left thigh,, tender, improved.  No calf tenderness.  Trace edema bilateral lower extremities.   Skin:    General: Skin is warm and dry.     Findings: No erythema or rash.  Neurological:     Mental Status: She is alert and oriented to person, place, and time.     Cranial Nerves: No cranial nerve deficit.     Coordination: Coordination normal.  Psychiatric:        Behavior: Behavior normal.        Thought Content: Thought content normal.        LABORATORY DATA:  I have reviewed the data as  listed Lab Results  Component Value Date   WBC 6.7 12/12/2018   HGB 10.3 (L) 12/12/2018   HCT 32.0 (L) 12/12/2018   MCV 87.2 12/12/2018   PLT 204 12/12/2018   Recent Labs    10/04/18 1336 11/21/18 0801 12/12/18 0816  NA 137 140 139  K 3.9 3.4* 3.6  CL 105 108 106  CO2 _0 GLUCOSE 120* 93 85  BUN _1 CREATININE 0.65 0.71 0.77  CALCIUM 9.0 9.5 9.4  GFRNONAA >60 >60 >60  GFRAA >60 >60 >60  PROT 6.6 7.0 7.1  ALBUMIN 3.6 4.1 4.3  AST _2 ALT _3 ALKPHOS 70 50 56  BILITOT 0.4 0.5 0.6   Iron/TIBC/Ferritin/ %Sat    Component Value Date/Time   IRON 25 (L) 09/05/2018 0819   IRON 11 (L) 07/17/2018 1124   TIBC 245 (L) 09/05/2018 0819   TIBC 312 07/17/2018 1124   FERRITIN 323 (H) 09/05/2018 0819   FERRITIN 60 07/17/2018 1124   IRONPCTSAT 10 (L) 09/05/2018 0819   IRONPCTSAT 4 (LL) 07/17/2018 1124    RADIOGRAPHIC STUDIES: I have personally reviewed the radiological images as listed and agreed with the findings in the report. 08/24/1999 nineteen 2D echo showed LVEF 55 to 60%. 08/30/2018 CXR Interval reduction/resolution of right-sided pleural effusion post thoracentesis. No pneumothorax.  09/23/2018, [place] lower extremity venous bilaterally positive for bilateral DVT CT chest PE protocol showed bilateral pulmonary emboli, most centrally in  the bilateral main pulmonary arteries.  There are associated with signs suggestive of right heart strain.  No evidence of pulmonary infarct.  Moderate right pleural effusion.  Partially visualized left hydronephrosis also present on prior CT.   ASSESSMENT & PLAN:  1. Endometrial adenocarcinoma ([place])   2. Thrombophlebitis of superficial veins of left lower extremity   3. Chronic anticoagulation   4. Encounter for antineoplastic chemotherapy   5. Neuropathy due to chemotherapeutic drug ([place])     # FIGO grade 3 endometrial adenocarcinoma, pT3a Nx pM1 11/21/2018, post surgery CA125 58.3 Patient's case was discussed on [place] oncology tumor board.  Consensus was to continue with additional 3 cycles of carboplatin and paclitaxel.  Follow CA125 for response.  Repeat imaging after finishing chemotherapy.  Can consider radiation at that time. Labs reviewed and discussed with patient.  Counts acceptable to proceed with cycle 5 carbo Taxol. She will receive G-CSF support with improved.  Advised patient to take Claritin for 4 days  #Bilateral PE and bilateral lower extremity DVT, continue Eliquis 5 mg twice daily. She tolerates well. Recent superficial thrombophlebitis, improved with warm compress.  Continue to monitor.  #Grade 1 therapy-induced neuropathy, she has most of the symptoms at night.  Continue gabapentin 900 MCG QHS  #Mild hypokalemia, continue potassium chloride 20 mEq daily. The patient knows to call the clinic with any problems questions or concerns.  Return of visit: 3 weeks Total face to face encounter time for this patient visit was 25 min. >50% of the time was  spent in counseling and coordination of care.   Earlie Server, MD, PhD Hematology Oncology [facility] Pager- [phone] 12/12/2018

## 2018-12-26 DIAGNOSIS — Z923 Personal history of irradiation: Secondary | ICD-10-CM

## 2018-12-26 HISTORY — DX: Personal history of irradiation: Z92.3

## 2018-12-31 ENCOUNTER — Other Ambulatory Visit: Payer: Self-pay | Admitting: *Deleted

## 2018-12-31 MED ORDER — APIXABAN 5 MG PO TABS: 5.0000 mg | ORAL_TABLET | Freq: Two times a day (BID) | ORAL | 3 refills | Status: DC

## 2019-01-02 ENCOUNTER — Inpatient Hospital Stay: Payer: 59

## 2019-01-02 ENCOUNTER — Ambulatory Visit
Admission: RE | Admit: 2019-01-02 | Discharge: 2019-01-02 | Disposition: A | Discharge status: Home / Self Care | Payer: 59 | Source: Ambulatory Visit | Attending: Radiation Oncology | Admitting: Radiation Oncology

## 2019-01-02 ENCOUNTER — Other Ambulatory Visit: Payer: Self-pay

## 2019-01-02 ENCOUNTER — Inpatient Hospital Stay: Payer: 59 | Attending: Oncology

## 2019-01-02 ENCOUNTER — Inpatient Hospital Stay (HOSPITAL_BASED_OUTPATIENT_CLINIC_OR_DEPARTMENT_OTHER): Payer: 59 | Admitting: Oncology

## 2019-01-02 ENCOUNTER — Encounter: Payer: Self-pay | Admitting: Oncology

## 2019-01-02 VITALS — BP 121/86 | HR 69 | Temp 96.6°F | Resp 18 | Wt 203.0 lb

## 2019-01-02 DIAGNOSIS — C541 Malignant neoplasm of endometrium: Secondary | ICD-10-CM | POA: Insufficient documentation

## 2019-01-02 DIAGNOSIS — T451X5S Adverse effect of antineoplastic and immunosuppressive drugs, sequela: Secondary | ICD-10-CM | POA: Diagnosis not present

## 2019-01-02 DIAGNOSIS — Z7901 Long term (current) use of anticoagulants: Secondary | ICD-10-CM | POA: Insufficient documentation

## 2019-01-02 DIAGNOSIS — G620 Drug-induced polyneuropathy: Secondary | ICD-10-CM | POA: Diagnosis not present

## 2019-01-02 DIAGNOSIS — I2699 Other pulmonary embolism without acute cor pulmonale: Secondary | ICD-10-CM | POA: Insufficient documentation

## 2019-01-02 DIAGNOSIS — Z5111 Encounter for antineoplastic chemotherapy: Secondary | ICD-10-CM

## 2019-01-02 DIAGNOSIS — D6481 Anemia due to antineoplastic chemotherapy: Secondary | ICD-10-CM | POA: Insufficient documentation

## 2019-01-02 DIAGNOSIS — Z86718 Personal history of other venous thrombosis and embolism: Secondary | ICD-10-CM

## 2019-01-02 DIAGNOSIS — Z95828 Presence of other vascular implants and grafts: Secondary | ICD-10-CM

## 2019-01-02 DIAGNOSIS — T451X5A Adverse effect of antineoplastic and immunosuppressive drugs, initial encounter: Secondary | ICD-10-CM

## 2019-01-02 LAB — COMPREHENSIVE METABOLIC PANEL
ALT: 20 U/L (ref 0–44)
AST: 17 U/L (ref 15–41)
Albumin: 4.5 g/dL (ref 3.5–5.0)
Alkaline Phosphatase: 58 U/L (ref 38–126)
Anion gap: 8 (ref 5–15)
BILIRUBIN TOTAL: 1 mg/dL (ref 0.3–1.2)
BUN: 19 mg/dL (ref 6–20)
CO2: 26 mmol/L (ref 22–32)
Calcium: 9.4 mg/dL (ref 8.9–10.3)
Chloride: 106 mmol/L (ref 98–111)
Creatinine, Ser: 0.77 mg/dL (ref 0.44–1.00)
GFR calc Af Amer: >60 mL/min (ref >60)
GFR calc non Af Amer: >60 mL/min (ref >60)
Glucose, Bld: 88 mg/dL (ref 70–99)
Potassium: 3.5 mmol/L (ref 3.5–5.1)
Sodium: 140 mmol/L (ref 135–145)
TOTAL PROTEIN: 7.1 g/dL (ref 6.5–8.1)

## 2019-01-02 LAB — CBC WITH DIFFERENTIAL/PLATELET
Abs Immature Granulocytes: 0.01 10*3/uL (ref 0.00–0.07)
BASOS ABS: 0 10*3/uL (ref 0.0–0.1)
Basophils Relative: 1 %
Eosinophils Absolute: 0.1 10*3/uL (ref 0.0–0.5)
Eosinophils Relative: 2 %
HCT: 32.4 % — ABNORMAL LOW (ref 36.0–46.0)
Hemoglobin: 10.7 g/dL — ABNORMAL LOW (ref 12.0–15.0)
Immature Granulocytes: 0 %
LYMPHS ABS: 2.7 10*3/uL (ref 0.7–4.0)
Lymphocytes Relative: 49 %
MCH: 28.6 pg (ref 26.0–34.0)
MCHC: 33 g/dL (ref 30.0–36.0)
MCV: 86.6 fL (ref 80.0–100.0)
Monocytes Absolute: 0.5 10*3/uL (ref 0.1–1.0)
Monocytes Relative: 8 %
NRBC: 0 % (ref 0.0–0.2)
Neutro Abs: 2.2 10*3/uL (ref 1.7–7.7)
Neutrophils Relative %: 40 %
Platelets: 169 10*3/uL (ref 150–400)
RBC: 3.74 MIL/uL — ABNORMAL LOW (ref 3.87–5.11)
RDW: 16.8 % — ABNORMAL HIGH (ref 11.5–15.5)
WBC: 5.6 10*3/uL (ref 4.0–10.5)

## 2019-01-02 MED ORDER — SODIUM CHLORIDE 0.9 % IV SOLN
20.0000 mg | Freq: Once | INTRAVENOUS | Status: DC
  Filled 2019-01-02: qty 2

## 2019-01-02 MED ORDER — DIPHENHYDRAMINE HCL 50 MG/ML IJ SOLN
50.0000 mg | Freq: Once | INTRAMUSCULAR | Status: AC
  Administered 2019-01-02: 50 mg via INTRAVENOUS
  Filled 2019-01-02: qty 1

## 2019-01-02 MED ORDER — SODIUM CHLORIDE 0.9 % IV SOLN
175.0000 mg/m2 | Freq: Once | INTRAVENOUS | Status: AC
  Administered 2019-01-02: 402 mg via INTRAVENOUS
  Filled 2019-01-02: qty 67

## 2019-01-02 MED ORDER — SODIUM CHLORIDE 0.9 % IV SOLN
900.0000 mg | Freq: Once | INTRAVENOUS | Status: AC
  Administered 2019-01-02: 900 mg via INTRAVENOUS
  Filled 2019-01-02: qty 90

## 2019-01-02 MED ORDER — PEGFILGRASTIM 6 MG/0.6ML ~~LOC~~ PSKT
6.0000 mg | PREFILLED_SYRINGE | Freq: Once | SUBCUTANEOUS | Status: AC
  Administered 2019-01-02: 6 mg via SUBCUTANEOUS
  Filled 2019-01-02: qty 0.6

## 2019-01-02 MED ORDER — PALONOSETRON HCL INJECTION 0.25 MG/5ML
0.2500 mg | Freq: Once | INTRAVENOUS | Status: AC
  Administered 2019-01-02: 0.25 mg via INTRAVENOUS
  Filled 2019-01-02: qty 5

## 2019-01-02 MED ORDER — FAMOTIDINE IN NACL 20-0.9 MG/50ML-% IV SOLN
20.0000 mg | Freq: Once | INTRAVENOUS | Status: AC
  Administered 2019-01-02: 20 mg via INTRAVENOUS
  Filled 2019-01-02: qty 50

## 2019-01-02 MED ORDER — SODIUM CHLORIDE 0.9 % IV SOLN
Freq: Once | INTRAVENOUS | Status: AC
  Administered 2019-01-02: 10:00:00 via INTRAVENOUS
  Filled 2019-01-02: qty 5

## 2019-01-02 MED ORDER — HEPARIN SOD (PORK) LOCK FLUSH 100 UNIT/ML IV SOLN
500.0000 [IU] | Freq: Once | INTRAVENOUS | Status: AC | PRN
  Administered 2019-01-02: 500 [IU]

## 2019-01-02 MED ORDER — SODIUM CHLORIDE 0.9 % IV SOLN
Freq: Once | INTRAVENOUS | Status: AC
  Administered 2019-01-02: 10:00:00 via INTRAVENOUS
  Filled 2019-01-02: qty 250

## 2019-01-02 NOTE — Progress Notes (Signed)
Hematology/Oncology follow up  note [facility] Telephone:([phone] [phone]   Patient Care Team: Virginia Crews, MD as PCP - General (Family Medicine) Clent Jacks, RN as Registered Nurse  REFERRING PROVIDER: Lewis VISIT Follow up for management of  endometrial cancer.   HISTORY OF PRESENTING ILLNESS:  Karen Mann is a  58 y.o.  female with PMH listed below who was referred to me for evaluation of newly diagnosed endometrial cancer. Patient was recently referred to GYN Dr. Gilman Schmidt for abnormal uterine bleeding. 07/23/2018 ultrasound was performed showed large pelvic mass 20 x 13 x 22 cm with cystic and solid components.  Ultrasound also showed 4.9 cm fibroid and thickened endometrium 15 mm thickness 08/05/2018 MRI pelvis with and without contrast 20.6 cm complex cystic solid mass in the pelvis with numerous thickened enhancing septations, compatible with malignant ovarian neoplasm.  Neither ovary was discretely visualized.  Small volume of ascites.  Cross paratonia disease is not visualized but not excluded.  Left hydro-ureteronephrosis secondary to extrinsic compression. 5.3 intramural anterior uterine body fibroid.  #Endometrial biopsy was performed.  Pathology showed endometrioid adenocarcinoma consistent with FIGO grade 3.  8/12/19CA125 was elevated at 622 Patient was referred to see Dr. Fransisca Connors.  She was evaluated by Dr. Meredith Mody on 08/08/2018.  Staging CT was obtained. #Images independently reviewed by me.   Staging CT showed large cystic and a solid mass likely arising from the ovaries, measures 369 0 cc, suspicious for ovarian malignancy.  Moderate ascites noted.  Raising concern for malignant peritoneal spread.  No well-defined omental caking or solid tumor deposits along a situs., -Large right pleural effusion over half of right hemithorax volume. -Liver hypodensity 2.8 x 2.8 cm -Moderate left and mild right  hydronephrosis attributed to the ureteral compression due to mass. -Other chronic image findings.  MRI pelvis with and without contrast again showed 20.6 cm complex cystic/solid mass in the pelvis.  Compatible with malignant ovarian neoplasm.  Small volume of a situs.  Cross peritoneal disease is not visualized but cannot be excluded.  Left hydroureteronephrosis secondary to compression.  #Patient was scheduled to have a diagnostic and therapeutic thoracentesis this morning.  Cytology pending. She feels breathing is much better after the thoracentesis. Mild shortness of breath, worse with exertion.  Heart palpitation as well denies any chest pain, abdominal pain. Ongoing vaginal bleeding, she passed a big clot today. Feel extremely fatigued. She feels overwhelming and very nervous about the cancer diagnosis.  She has insomnia and she has tried melatonin which did not help with the sleep.  # Dr. Fransisca Connors recommends systemic therapy with carboplatin and paclitaxel 3 cycles, reassessment for surgical evaluation.per Dr.Berchuck, not candidate for Pembrolizumab and Levetinib trial.   # 08/10/2018 thoracentesis  with 1.2 L fluid removed.  Cytology negative for malignancy. 08/16/2018 status post thoracentesis again and removed 2.2 L of mildly bloody fluid.  I discussed with Dr. Dicie Beam and she reviewed the slides there was no malignancy cells. Pleural fluid LDH 246, albumin 2.5, protein 4.2 Serum LDH 270.  Protein level 6.4 08/22/2018, another thoracentesis both liters of fluid.  Cytology negative.  Patient's case was discussed on GYN tumor conference.  Dr. Theora Gianotti recommends NexGen sequence molecular testing.  And also check for PDL 1.  # CT PE study showed bilateral pulmonary embolism with right heart strain..  Lower extremity venous Doppler showed bilateral DVT  1. Deep vein thrombus in the right popliteal vein; thrombus extends into the right tibioperoneal trunk  and proximal and mid portions of the  right peroneal veins. 2. Deep vein thrombus in the left common femoral vein and in the left popliteal vein. 3. Occlusive thrombus is seen in the left greater saphenous vein, a superficial vein.  Patient was admitted at [facility] and started on heparin drip.  Discharged on Lovenox 100 mg subcutaneous twice daily. Debulking surgery was delayed due to PE and DVT  10/29/2018 patient underwent debulking procedure by Dr. Fransisca Connors. Pathology showed A. Mesenteric nodule, excision:Benign inclusion with transitional type epithelium.Negative for malignancy.  B. Right pelvic peritoneum, excision:Mesothelial-lined fibromuscular and adipose tissue.Negative for malignancy.  C. Appendices epiploica, excision:Metastatic adenocarcinoma D. Omentum, omentectomy: Omentum.Negative for malignancy.  E. Uterus, bilateral ovaries and fallopian tubes, hysterectomy and bilateral salpingo-oophorectomy:  Endometrioid adenocarcinoma of the endometrium. See note FIGO grade: 3 of 3. Tumor size: 3.5 cm Depth of invasion: 3 mm in a 37 mm thick myometrium Cervical involvement: Not identified  Adnexal involvement: Metastatic carcinoma involves bilateral ovaries. Lymphovascular invasion: Not identified   Microsatellite instability testing has been ordered on block E5. Note: The residual carcinoma in the uterus is present in a background of abundant macrophages and lymphocytes, consistent with a partial response to therapy. No significant response to therapy is identified in the tumor in the ovaries. The tumor grade is based on the combination of architectural and cytologic features.  SPECIMEN   Procedure:  Total hysterectomy and bilateral salpingo-oophorectomy    Procedure:  Omentectomy    Procedure:  Peritoneal biopsies    Specimen Integrity:  Received disrupted  TUMOR   Tumor Site:  Fundus    Histologic Type:  Endometrioid carcinoma, NOS    Histologic Grade:  FIGO grade 3    Tumor Size:  Greatest  dimension in Centimeters (cm): 3.5 Centimeters (cm)   Tumor Extent:      Myometrial Invasion:  Present      Depth of Invasion in Millimeters (mm):  3 Millimeters (mm)     Myometrial Thickness in Millimeters (mm):  37 Millimeters (mm)     Percentage of Myometrial Invasion:  8 %    Adenomyosis:  Present, involved by carcinoma     Uterine Serosa Involvement:  Not identified     Lower Uterine Segment Involvement:  Not identified     Cervical Stromal Involvement:  Not identified     Other Tissue / Organ Involvement:  Right ovary     Other Tissue / Organ Involvement:  Left ovary     Other Tissue / Organ Involvement:  Appendices epiploica    Accessory Findings:      Lymphovascular Invasion:  Not identified  Margins LYMPH NODES   Regional Lymph Nodes:  No lymph nodes submitted or found  PATHOLOGIC STAGE CLASSIFICATION (pTNM, AJCC 8th Edition)   Primary Tumor (pT):  pT3a    Regional Lymph Nodes (pN):      Category (pN):  pNX    Distant Metastasis (pM):  pM1     Site(s):  Appendices epiploica  ADDITIONAL FINDINGS   Additional Pathologic Findings:  Uterine leiomyomata    #  Family history ovarian cancer and personal history of endometrial CA.Patient also has had genetic testing done which showed 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  She has discussed findings with Dietitian.  She does not have features of NF.  Not meeting criteria for diagnosis of NF1.  Patient's case was discussed on [facility] oncology tumor board.  Consensus was  to continue with additional 3 cycles of carboplatin and paclitaxel.  Follow CA125 for response.  Repeat imaging after finishing chemotherapy.  Can consider radiation at that time.  INTERVAL  HISTORY Karen Mann is a 58 y.o. female with oncology history reviewed by me today for assessment prior to adjuvant chemotherapy with [place] and Taxol.  Today's is her cycle 6 chemotherapy.  #Patient developed thrombophlebitis and was seen by me on 12/04/2018.  She was advised to continue take Eliquis 5 mg twice daily, warm compress.  Today she feels the knot has completely resolved.  No tenderness.  #Tolerated last cycle of chemotherapy well.  Denies any nausea, vomiting, diarrhea. #Chronic neuropathy, she is usin gabapentin 600 to 900 mg nightly depending on her neuropathy severity.  Symptom is well controlled. #Chronic fatigue at baseline.  No new complaints. Denies any shortness of breath, lower extremity swelling, chest pain, abdominal pain.  Denies fever or chills  Review of Systems  Constitutional: Negative for chills, fever, malaise/fatigue and weight loss.  HENT: Negative for sore throat.   Eyes: Negative for redness.  Respiratory: Negative for cough, shortness of breath and wheezing.   Cardiovascular: Negative for chest pain, palpitations and leg swelling.  Gastrointestinal: Negative for abdominal pain, blood in stool, nausea and vomiting.  Genitourinary: Negative for dysuria.  Musculoskeletal: Negative for myalgias.  Skin: Negative for rash.  Neurological: Negative for dizziness, tingling and tremors.  Endo/Heme/Allergies: Does not bruise/bleed easily.  Psychiatric/Behavioral: Negative for hallucinations.    MEDICAL HISTORY:  Past Medical History:  Diagnosis Date  . Allergy   . Anemia 07/20/2018  . Endometrial adenocarcinoma ([place]) 07/2018  . Fibroid uterus 07/24/2018   5cm/2 inch   . Ovarian mass 07/24/2018   22cm/10 inch  . Pelvic mass     SURGICAL HISTORY: Past Surgical History:  Procedure Laterality Date  . EYE SURGERY  2000   Lasik  . PORTA CATH INSERTION N/A 08/13/2018   Procedure: PORTA CATH INSERTION;  Surgeon: Algernon Huxley, MD;  Location: [place]  CV LAB;  Service: Cardiovascular;  Laterality: N/A;    SOCIAL HISTORY: Social History   Socioeconomic History  . Marital status: Married    Spouse name: Not on file  . Number of children: Not on file  . Years of education: Not on file  . Highest education level: Not on file  Occupational History  . Not on file  Social Needs  . Financial resource strain: Not on file  . Food insecurity:    Worry: Not on file    Inability: Not on file  . Transportation needs:    Medical: Not on file    Non-medical: Not on file  Tobacco Use  . Smoking status: Never Smoker  . Smokeless tobacco: Never Used  Substance and Sexual Activity  . Alcohol use: No  . Drug use: No  . Sexual activity: Not Currently    Birth control/protection: Post-menopausal  Lifestyle  . Physical activity:    Days per week: Not on file    Minutes per session: Not on file  . Stress: Not on file  Relationships  . Social connections:    Talks on phone: Not on file    Gets together: Not on file    Attends religious service: Not on file    Active member of club or organization: Not on file    Attends meetings of clubs or organizations: Not on file    Relationship status: Not on file  . Intimate partner violence:  Fear of current or ex partner: Not on file    Emotionally abused: Not on file    Physically abused: Not on file    Forced sexual activity: Not on file  Other Topics Concern  . Not on file  Social History Narrative  . Not on file    FAMILY HISTORY: Family History  Problem Relation Age of Onset  . Hypertension Father   . Ovarian cancer Mother 54       deceased 85  . Healthy Brother   . Skin cancer Paternal Grandmother        deceased 56s  . Cancer Paternal Grandfather        unk. primary; deceased 71s  . Ovarian cancer Other        mat grandmother's sister    ALLERGIES:  is allergic to paclitaxel and penicillins.  MEDICATIONS:  Current Outpatient Medications  Medication Sig Dispense  Refill  . acetaminophen (TYLENOL) 325 MG tablet Take by mouth. Take 650 mg by mouth every 4 (four) hours as needed for Pain    . ALPRAZolam (XANAX) 0.25 MG tablet Take 1 tablet (0.25 mg total) by mouth at bedtime as needed for anxiety or sleep. 20 tablet 0  . apixaban (ELIQUIS) 5 MG TABS tablet Take 1 tablet (5 mg total) by mouth 2 (two) times daily. 60 tablet 3  . dexamethasone (DECADRON) 4 MG tablet Take 2 tablets (8 mg total) by mouth See admin instructions. 2 days before and 2 days after chemotherapy 30 tablet 1  . gabapentin (NEURONTIN) 300 MG capsule Take 3 capsules (900 mg total) by mouth at bedtime. 60 capsule 3  . lidocaine-prilocaine (EMLA) cream Apply to affected area once 30 g 3  . ondansetron (ZOFRAN) 8 MG tablet Take 1 tablet (8 mg total) by mouth 2 (two) times daily as needed for refractory nausea / vomiting. Start on day 3 after chemo. 30 tablet 1  . oxyCODONE (ROXICODONE) 5 MG immediate release tablet Take 1 tablet (5 mg total) by mouth every 6 (six) hours as needed for moderate pain, severe pain or breakthrough pain. 10 tablet 0  . potassium chloride SA (K-DUR,KLOR-CON) 20 MEQ tablet Take 1 tablet (20 mEq total) by mouth daily. 30 tablet 0  . prochlorperazine (COMPAZINE) 10 MG tablet Take 1 tablet (10 mg total) by mouth every 6 (six) hours as needed (Nausea or vomiting). 30 tablet 1  . sennosides-docusate sodium (SENOKOT-S) 8.6-50 MG tablet Take 2 tablets by mouth daily.    . furosemide (LASIX) 20 MG tablet Take 1 tablet (20 mg total) by mouth daily as needed for fluid. (Patient not taking: Reported on 01/02/2019) 30 tablet 0  . Polyethylene Glycol 3350 (MIRALAX PO) Take 17 g by mouth daily as needed.     Marland Kitchen rOPINIRole (REQUIP) 0.25 MG tablet Take 1 tablet (0.25 mg total) by mouth at bedtime. Take 1-3 hours before bedtime (Patient not taking: Reported on 01/02/2019) 30 tablet 3   No current facility-administered medications for this visit.    Facility-Administered Medications Ordered  in Other Visits  Medication Dose Route Frequency Provider Last Rate Last Dose  . CARBOplatin (PARAPLATIN) 900 mg in sodium chloride 0.9 % 500 mL chemo infusion  900 mg Intravenous Once Earlie Server, MD      . heparin lock flush 100 unit/mL  500 Units Intracatheter Once PRN Earlie Server, MD      . PACLitaxel (TAXOL) 402 mg in sodium chloride 0.9 % 500 mL chemo infusion (> 63m/m2)  175  mg/m2 (Treatment Plan Recorded) Intravenous Once Earlie Server, MD 189 mL/hr at 01/02/19 1057 402 mg at 01/02/19 1057  . pegfilgrastim (NEULASTA ONPRO KIT) injection 6 mg  6 mg Subcutaneous Once Earlie Server, MD         PHYSICAL EXAMINATION: ECOG PERFORMANCE STATUS: 1 - Symptomatic but completely ambulatory Vitals:   01/02/19 0841  BP: 121/86  Pulse: 69  Resp: 18  Temp: (!) 96.6 F (35.9 C)  SpO2: 99%   Filed Weights   01/02/19 0841  Weight: 203 lb (92.1 kg)    Physical Exam Constitutional:      General: She is not in acute distress. HENT:     Head: Normocephalic and atraumatic.  Eyes:     General: No scleral icterus.    Pupils: Pupils are equal, round, and reactive to light.  Neck:     Musculoskeletal: Normal range of motion and neck supple.  Cardiovascular:     Rate and Rhythm: Normal rate and regular rhythm.     Heart sounds: Normal heart sounds.  Pulmonary:     Effort: Pulmonary effort is normal. No respiratory distress.     Breath sounds: Normal breath sounds. No wheezing.  Abdominal:     General: Bowel sounds are normal. There is no distension.     Palpations: Abdomen is soft. There is no mass.     Tenderness: There is no abdominal tenderness.  Musculoskeletal: Normal range of motion.        General: No swelling or deformity.  Skin:    General: Skin is warm and dry.     Findings: No erythema or rash.  Neurological:     Mental Status: She is alert and oriented to person, place, and time.     Cranial Nerves: No cranial nerve deficit.     Coordination: Coordination normal.  Psychiatric:         Behavior: Behavior normal.        Thought Content: Thought content normal.        LABORATORY DATA:  I have reviewed the data as listed Lab Results  Component Value Date   WBC 5.6 01/02/2019   HGB 10.7 (L) 01/02/2019   HCT 32.4 (L) 01/02/2019   MCV 86.6 01/02/2019   PLT 169 01/02/2019   Recent Labs    11/21/18 0801 12/12/18 0816 01/02/19 0805  NA 140 139 140  K 3.4* 3.6 3.5  CL 108 106 106  CO2 _0 GLUCOSE 93 85 88  BUN _1 CREATININE 0.71 0.77 0.77  CALCIUM 9.5 9.4 9.4  GFRNONAA >60 >60 >60  GFRAA >60 >60 >60  PROT 7.0 7.1 7.1  ALBUMIN 4.1 4.3 4.5  AST _2 ALT _3 ALKPHOS 50 56 58  BILITOT 0.5 0.6 1.0   Iron/TIBC/Ferritin/ %Sat    Component Value Date/Time   IRON 25 (L) 09/05/2018 0819   IRON 11 (L) 07/17/2018 1124   TIBC 245 (L) 09/05/2018 0819   TIBC 312 07/17/2018 1124   FERRITIN 323 (H) 09/05/2018 0819   FERRITIN 60 07/17/2018 1124   IRONPCTSAT 10 (L) 09/05/2018 0819   IRONPCTSAT 4 (LL) 07/17/2018 1124    RADIOGRAPHIC STUDIES: I have personally reviewed the radiological images as listed and agreed with the findings in the report. 08/24/1999 nineteen 2D echo showed LVEF 55 to 60%. 08/30/2018 CXR Interval reduction/resolution of right-sided pleural effusion post thoracentesis. No pneumothorax.  09/23/2018, [place] lower extremity venous bilaterally positive for bilateral DVT  CT chest PE protocol showed bilateral pulmonary emboli, most centrally in the bilateral main pulmonary arteries.  There are associated with signs suggestive of right heart strain.  No evidence of pulmonary infarct.  Moderate right pleural effusion.  Partially visualized left hydronephrosis also present on prior CT.   ASSESSMENT & PLAN:  1. Endometrial adenocarcinoma ([place])   2. Chronic anticoagulation   3. Encounter for antineoplastic chemotherapy   4. Neuropathy due to chemotherapeutic drug ([place])   5. Bilateral pulmonary embolism ([place])   6. History of deep vein  thrombosis (DVT) of lower extremity   7. Anemia due to antineoplastic chemotherapy   8. Port-A-Cath in place   9. Presence of IVC filter     # FIGO grade 3 endometrial adenocarcinoma, pT3a Nx pM1 11/21/2018, post surgery CA125 58.3 Tolerates adjuvant chemotherapy well.  Labs reviewed and discussed with patient.  Counts acceptable to proceed with cycle 6 carbo AUC 6 and Taxol.  She will receive G-CSF report with Onrpo.  Advised patient to take Claritin for 4 days.  #Bilateral PE and bilateral lower extremity DVT, clinically improving.  No new symptoms.  Continue Eliquis 5 mg twice daily. Tolerates well.  Recent superficial thrombophlebitis has completely resolved.  Continue to monitor. She has IVC filter placed at [facility] in October 2019 which can be discontinued after she finishes 6 months of anticoagulation [April-May 2020]  Plan continue Eliquis at 2.5 mg twice daily after that.  #Grade 1 therapy-induced neuropathy, continue gabapentin 900 MCG QHS #Anemia due to chemotherapy.  Hemoglobin stable.  Continue monitor. #hypokalemia, resolved.  Continue potassium chloride 20 mEq daily. # Port A Cath in place, port flush Q 6weeks.  The patient knows to call the clinic with any problems questions or concerns.  Orders Placed This Encounter  Procedures  . CT Chest W Contrast    Standing Status:   Future    Standing Expiration Date:   01/02/2020    Order Specific Question:   ** REASON FOR EXAM (FREE TEXT)    Answer:   endometrial carcinoma s/p surgery and chemotherapy    Order Specific Question:   If indicated for the ordered procedure, I authorize the administration of contrast media per Radiology protocol    Answer:   Yes    Order Specific Question:   Is patient pregnant?    Answer:   No    Order Specific Question:   Preferred imaging location?    Answer:   Standing [facility]    Order Specific Question:   Radiology Contrast Protocol - do NOT remove file path    Answer:    \\[email or web address]  . CT Abdomen Pelvis W Contrast    Standing Status:   Future    Standing Expiration Date:   01/02/2020    Order Specific Question:   ** REASON FOR EXAM (FREE TEXT)    Answer:   endometrial carcinoma s/p surgery and chemotherapy    Order Specific Question:   If indicated for the ordered procedure, I authorize the administration of contrast media per Radiology protocol    Answer:   Yes    Order Specific Question:   Is patient pregnant?    Answer:   No    Order Specific Question:   Preferred imaging location?    Answer:   [facility]    Order Specific Question:   Is Oral Contrast requested for this exam?    Answer:   Yes, Per Radiology protocol    Order Specific Question:  Radiology Contrast Protocol - do NOT remove file path    Answer:   \\[email or web address]    Return of visit: To be determined.  Depending on repeat image results and plan of radiation. Earlie Server, MD, PhD Hematology Oncology [facility] at [facility] Pager- [phone] 01/02/2019

## 2019-01-02 NOTE — Progress Notes (Signed)
Patient here for follow up. No concerns voiced.  °

## 2019-01-02 NOTE — Consult Note (Signed)
NEW PATIENT EVALUATION  Name: Karen Mann  MRN: [id number]  Date:   01/02/2019     DOB: 1961-02-21   This 58 y.o. female patient presents to the clinic for initial evaluation of stage IIIa grade 3 endometrioid adenocarcinoma status post new adjuvant chemotherapy resection and adjuvant chemotherapy for evaluation of adjuvant radiation therapy for pelvic sidewall involvement of tumor at the time of surgery.Marland Kitchen  REFERRING PHYSICIAN: Virginia Crews, MD  CHIEF COMPLAINT: No chief complaint on file.   DIAGNOSIS: The encounter diagnosis was Endometrial adenocarcinoma ([place]).   PREVIOUS INVESTIGATIONS:  CT scans reviewed Pathology reports reviewed Clinical notes reviewed Case discussed with GYN oncology  HPI: patient is a pleasant 58 year old female who presented with significant postmenopausal bleeding over 5-6 months.on examination she was noted to have a large abdominal mass confirmed on pelvic ultrasound concerning for ovarian carcinoma.pelvic ultrasound confirmed a 20 x 13 x 22 cm cystic mass with solid components. MRI of the pelvis confirmed a 20.6 cm mass which was cystic and solid with numerous enhancing septations compatible with malignant ovarian neoplasm.she had left hydronephrosis secondary to extrinsic compression and a 5.3 cm intramural anterior uterine body fibroid. Endometrial biopsy was positive for grade 3 endometrioid adenocarcinoma.CT scan confirmed a 28 cm mass suspicious for ovarian malignancy with moderate ascites. There was also a large right pleural effusion and moderate left and right hydronephrosis. Patient was initiated on carboplatinum and Taxol.patient developed a DVT and bilateral pulmonary emboli. She had a filter placed based on ultrasound demonstrating lower extremities DVT.after 3 cycles of chemotherapy repeat CT scan showed little change in the large pelvic mass again measuring 21.6 cm in greatest dimensiion. She underwent a palliative thoracentesis. She then  underwent exploratory laparotomy total abdominal hysterectomy bilateral salpingo-oophorectomyomentectomy performed at [facility] by Dr. Fransisca Connors.athology showed mesenteric nodule negative for malignancy metastatic adenocarcinoma involving the appendiceal epiploica, tumor was grade 33.5 cm endometrioid adenocarcinoma with 3 mm invasion of 37 mm thick myometrium cervical involvement not identified lymph vascular invasion not identified metastatic carcinoma involved bilateral ovaries. At the time of surgical resection there was adherence of tumor to the pelvic sidewalls. Patient is undergone 3 cycles of additional chemotherapy which she is tolerated well. She is now referred to radiation oncology for consideration of treatment were pelvis in the areas of surgical interpretation of bilateral pelvic sidewall involvement. She is doing fairly well she tends towards constipation is having no further vaginal bleeding. PLANNED TREATMENT REGIMEN:w hole pelvic radiation  PAST MEDICAL HISTORY:  has a past medical history of Allergy, Anemia (07/20/2018), Endometrial adenocarcinoma ([place]) (07/2018), Fibroid uterus (07/24/2018), Ovarian mass (07/24/2018), and Pelvic mass.    PAST SURGICAL HISTORY:  Past Surgical History:  Procedure Laterality Date  . EYE SURGERY  2000   Lasik  . PORTA CATH INSERTION N/A 08/13/2018   Procedure: PORTA CATH INSERTION;  Surgeon: Algernon Huxley, MD;  Location: [facility];  Service: Cardiovascular;  Laterality: N/A;    FAMILY HISTORY: family history includes Cancer in her paternal grandfather; Healthy in her brother; Hypertension in her father; Ovarian cancer in an other family member; Ovarian cancer (age of onset: 17) in her mother; Skin cancer in her paternal grandmother.  SOCIAL HISTORY:  reports that she has never smoked. She has never used smokeless tobacco. She reports that she does not drink alcohol or use drugs.  ALLERGIES: Paclitaxel and Penicillins  MEDICATIONS:  Current  Outpatient Medications  Medication Sig Dispense Refill  . acetaminophen (TYLENOL) 325 MG tablet Take by mouth.  Take 650 mg by mouth every 4 (four) hours as needed for Pain    . ALPRAZolam (XANAX) 0.25 MG tablet Take 1 tablet (0.25 mg total) by mouth at bedtime as needed for anxiety or sleep. 20 tablet 0  . apixaban (ELIQUIS) 5 MG TABS tablet Take 1 tablet (5 mg total) by mouth 2 (two) times daily. 60 tablet 3  . dexamethasone (DECADRON) 4 MG tablet Take 2 tablets (8 mg total) by mouth See admin instructions. 2 days before and 2 days after chemotherapy 30 tablet 1  . furosemide (LASIX) 20 MG tablet Take 1 tablet (20 mg total) by mouth daily as needed for fluid. (Patient not taking: Reported on 01/02/2019) 30 tablet 0  . gabapentin (NEURONTIN) 300 MG capsule Take 3 capsules (900 mg total) by mouth at bedtime. 60 capsule 3  . lidocaine-prilocaine (EMLA) cream Apply to affected area once 30 g 3  . ondansetron (ZOFRAN) 8 MG tablet Take 1 tablet (8 mg total) by mouth 2 (two) times daily as needed for refractory nausea / vomiting. Start on day 3 after chemo. 30 tablet 1  . oxyCODONE (ROXICODONE) 5 MG immediate release tablet Take 1 tablet (5 mg total) by mouth every 6 (six) hours as needed for moderate pain, severe pain or breakthrough pain. 10 tablet 0  . Polyethylene Glycol 3350 (MIRALAX PO) Take 17 g by mouth daily as needed.     . potassium chloride SA (K-DUR,KLOR-CON) 20 MEQ tablet Take 1 tablet (20 mEq total) by mouth daily. 30 tablet 0  . prochlorperazine (COMPAZINE) 10 MG tablet Take 1 tablet (10 mg total) by mouth every 6 (six) hours as needed (Nausea or vomiting). 30 tablet 1  . rOPINIRole (REQUIP) 0.25 MG tablet Take 1 tablet (0.25 mg total) by mouth at bedtime. Take 1-3 hours before bedtime (Patient not taking: Reported on 01/02/2019) 30 tablet 3  . sennosides-docusate sodium (SENOKOT-S) 8.6-50 MG tablet Take 2 tablets by mouth daily.     No current facility-administered medications for this  encounter.     ECOG PERFORMANCE STATUS:  0 - Asymptomatic  REVIEW OF SYSTEMS:  Patient denies any weight loss, fatigue, weakness, fever, chills or night sweats. Patient denies any loss of vision, blurred vision. Patient denies any ringing  of the ears or hearing loss. No irregular heartbeat. Patient denies heart murmur or history of fainting. Patient denies any chest pain or pain radiating to her upper extremities. Patient denies any shortness of breath, difficulty breathing at night, cough or hemoptysis. Patient denies any swelling in the lower legs. Patient denies any nausea vomiting, vomiting of blood, or coffee ground material in the vomitus. Patient denies any stomach pain. Patient states has had normal bowel movements no significant constipation or diarrhea. Patient denies any dysuria, hematuria or significant nocturia. Patient denies any problems walking, swelling in the joints or loss of balance. Patient denies any skin changes, loss of hair or loss of weight. Patient denies any excessive worrying or anxiety or significant depression. Patient denies any problems with insomnia. Patient denies excessive thirst, polyuria, polydipsia. Patient denies any swollen glands, patient denies easy bruising or easy bleeding. Patient denies any recent infections, allergies or URI. Patient "s visual fields have not changed significantly in recent time.    PHYSICAL EXAM: There were no vitals taken for this visit. Well-developed well-nourished patient in NAD. HEENT reveals PERLA, EOMI, discs not visualized.  Oral cavity is clear. No oral mucosal lesions are identified. Neck is clear without evidence of cervical or  supraclavicular adenopathy. Lungs are clear to A&P. Cardiac examination is essentially unremarkable with regular rate and rhythm without murmur rub or thrill. Abdomen is benign with no organomegaly or masses noted. Motor sensory and DTR levels are equal and symmetric in the upper and lower extremities.  Cranial nerves II through XII are grossly intact. Proprioception is intact. No peripheral adenopathy or edema is identified. No motor or sensory levels are noted. Crude visual fields are within normal range.  LABORATORY DATA: pathology reports reviewed    RADIOLOGY RESULTS:CT scans reviewed   IMPRESSION: pathologic stage TIII NX M1 FIGO grade 3 endometrioid adenocarcinoma status post neoadjuvant chemotherapy followed by rightdebulking surgery and adjuvant chemotherapy in 58 year old female  PLAN: I discussed the case personally with Dr.Berchuck who is concerned about adherence to the pelvic sidewalls at the time of surgical debulking. We've agreed to go ahead with pelvic radiation therapy. I would treat the whole pelvis to 4500 cGy based on her initial CT findings. Risks and benefits of treatment including possible diarrhea possible increased lower urinary tract symptoms fatigue alteration of blood counts and skin reaction all were discussed with the patient and her husband. I like to allow a couple of weeks after her last chemotherapy and have personally separate ordered CT simulation at that time.There will be extra effort by both professional staff as well as technical staff to coordinate and manage concurrent chemoradiation and ensuing side effects during her treatments.patient husband both seem to comprehend my treatment plan well.  I would like to take this opportunity to thank you for allowing me to participate in the care of your patient.Noreene Filbert, MD

## 2019-01-03 LAB — CA 125: Cancer Antigen (CA) 125: 6 U/mL (ref 0.0–38.1)

## 2019-01-07 ENCOUNTER — Telehealth: Payer: Self-pay | Admitting: *Deleted

## 2019-01-07 MED ORDER — GABAPENTIN 300 MG PO CAPS: 900.0000 mg | ORAL_CAPSULE | Freq: Every day | ORAL | 3 refills | Status: DC

## 2019-01-07 NOTE — Telephone Encounter (Signed)
Needed new prescription sent to pharmacy to reflect the increase in her Gabapentin dose to 900 mg

## 2019-01-15 ENCOUNTER — Ambulatory Visit
Admission: RE | Admit: 2019-01-15 | Discharge: 2019-01-15 | Disposition: A | Discharge status: Home / Self Care | Payer: 59 | Source: Ambulatory Visit | Attending: Radiation Oncology | Admitting: Radiation Oncology

## 2019-01-15 DIAGNOSIS — C541 Malignant neoplasm of endometrium: Secondary | ICD-10-CM | POA: Insufficient documentation

## 2019-01-15 DIAGNOSIS — Z510 Encounter for antineoplastic radiation therapy: Secondary | ICD-10-CM | POA: Insufficient documentation

## 2019-01-16 ENCOUNTER — Inpatient Hospital Stay: Payer: 59

## 2019-01-16 DIAGNOSIS — C541 Malignant neoplasm of endometrium: Secondary | ICD-10-CM | POA: Diagnosis not present

## 2019-01-16 DIAGNOSIS — Z510 Encounter for antineoplastic radiation therapy: Secondary | ICD-10-CM | POA: Diagnosis not present

## 2019-01-21 ENCOUNTER — Ambulatory Visit
Admission: RE | Admit: 2019-01-21 | Discharge: 2019-01-21 | Disposition: A | Discharge status: Home / Self Care | Payer: 59 | Source: Ambulatory Visit | Attending: Oncology | Admitting: Oncology

## 2019-01-21 DIAGNOSIS — C541 Malignant neoplasm of endometrium: Secondary | ICD-10-CM | POA: Diagnosis present

## 2019-01-21 MED ORDER — IOPAMIDOL (ISOVUE-300) INJECTION 61%
100.0000 mL | Freq: Once | INTRAVENOUS | Status: AC | PRN
  Administered 2019-01-21: 100 mL via INTRAVENOUS

## 2019-01-22 ENCOUNTER — Ambulatory Visit
Admission: RE | Admit: 2019-01-22 | Discharge: 2019-01-22 | Disposition: A | Discharge status: Home / Self Care | Payer: 59 | Source: Ambulatory Visit | Attending: Radiation Oncology | Admitting: Radiation Oncology

## 2019-01-22 DIAGNOSIS — Z510 Encounter for antineoplastic radiation therapy: Secondary | ICD-10-CM | POA: Diagnosis not present

## 2019-01-22 DIAGNOSIS — C541 Malignant neoplasm of endometrium: Secondary | ICD-10-CM | POA: Diagnosis not present

## 2019-01-23 ENCOUNTER — Ambulatory Visit
Admission: RE | Admit: 2019-01-23 | Discharge: 2019-01-23 | Disposition: A | Discharge status: Home / Self Care | Payer: 59 | Source: Ambulatory Visit | Attending: Radiation Oncology | Admitting: Radiation Oncology

## 2019-01-23 DIAGNOSIS — Z510 Encounter for antineoplastic radiation therapy: Secondary | ICD-10-CM | POA: Diagnosis not present

## 2019-01-23 DIAGNOSIS — C541 Malignant neoplasm of endometrium: Secondary | ICD-10-CM | POA: Diagnosis not present

## 2019-01-24 ENCOUNTER — Ambulatory Visit
Admission: RE | Admit: 2019-01-24 | Discharge: 2019-01-24 | Disposition: A | Discharge status: Home / Self Care | Payer: 59 | Source: Ambulatory Visit | Attending: Radiation Oncology | Admitting: Radiation Oncology

## 2019-01-24 DIAGNOSIS — Z510 Encounter for antineoplastic radiation therapy: Secondary | ICD-10-CM | POA: Diagnosis not present

## 2019-01-25 ENCOUNTER — Ambulatory Visit
Admission: RE | Admit: 2019-01-25 | Discharge: 2019-01-25 | Disposition: A | Discharge status: Home / Self Care | Payer: 59 | Source: Ambulatory Visit | Attending: Radiation Oncology | Admitting: Radiation Oncology

## 2019-01-25 DIAGNOSIS — Z510 Encounter for antineoplastic radiation therapy: Secondary | ICD-10-CM | POA: Diagnosis not present

## 2019-01-25 DIAGNOSIS — C541 Malignant neoplasm of endometrium: Secondary | ICD-10-CM | POA: Diagnosis not present

## 2019-01-28 ENCOUNTER — Ambulatory Visit
Admission: RE | Admit: 2019-01-28 | Discharge: 2019-01-28 | Disposition: A | Discharge status: Home / Self Care | Payer: 59 | Source: Ambulatory Visit | Attending: Radiation Oncology | Admitting: Radiation Oncology

## 2019-01-28 DIAGNOSIS — Z90722 Acquired absence of ovaries, bilateral: Secondary | ICD-10-CM | POA: Diagnosis not present

## 2019-01-28 DIAGNOSIS — Z7901 Long term (current) use of anticoagulants: Secondary | ICD-10-CM | POA: Diagnosis not present

## 2019-01-28 DIAGNOSIS — C541 Malignant neoplasm of endometrium: Secondary | ICD-10-CM | POA: Insufficient documentation

## 2019-01-28 DIAGNOSIS — Z90710 Acquired absence of both cervix and uterus: Secondary | ICD-10-CM | POA: Diagnosis not present

## 2019-01-28 DIAGNOSIS — Z510 Encounter for antineoplastic radiation therapy: Secondary | ICD-10-CM

## 2019-01-28 DIAGNOSIS — Z9221 Personal history of antineoplastic chemotherapy: Secondary | ICD-10-CM | POA: Diagnosis not present

## 2019-01-28 DIAGNOSIS — I2699 Other pulmonary embolism without acute cor pulmonale: Secondary | ICD-10-CM | POA: Diagnosis not present

## 2019-01-29 ENCOUNTER — Ambulatory Visit
Admission: RE | Admit: 2019-01-29 | Discharge: 2019-01-29 | Disposition: A | Discharge status: Home / Self Care | Payer: 59 | Source: Ambulatory Visit | Attending: Radiation Oncology | Admitting: Radiation Oncology

## 2019-01-29 DIAGNOSIS — C541 Malignant neoplasm of endometrium: Secondary | ICD-10-CM | POA: Diagnosis not present

## 2019-01-30 ENCOUNTER — Ambulatory Visit
Admission: RE | Admit: 2019-01-30 | Discharge: 2019-01-30 | Disposition: A | Discharge status: Home / Self Care | Payer: 59 | Source: Ambulatory Visit | Attending: Radiation Oncology | Admitting: Radiation Oncology

## 2019-01-30 ENCOUNTER — Inpatient Hospital Stay: Payer: 59 | Attending: Obstetrics and Gynecology | Admitting: Obstetrics and Gynecology

## 2019-01-30 ENCOUNTER — Other Ambulatory Visit: Payer: Self-pay

## 2019-01-30 VITALS — BP 127/87 | HR 80 | Temp 95.8°F | Resp 18 | Wt 207.8 lb

## 2019-01-30 DIAGNOSIS — Z90722 Acquired absence of ovaries, bilateral: Secondary | ICD-10-CM

## 2019-01-30 DIAGNOSIS — Z7901 Long term (current) use of anticoagulants: Secondary | ICD-10-CM | POA: Insufficient documentation

## 2019-01-30 DIAGNOSIS — Z9221 Personal history of antineoplastic chemotherapy: Secondary | ICD-10-CM | POA: Insufficient documentation

## 2019-01-30 DIAGNOSIS — Z90710 Acquired absence of both cervix and uterus: Secondary | ICD-10-CM

## 2019-01-30 DIAGNOSIS — I2699 Other pulmonary embolism without acute cor pulmonale: Secondary | ICD-10-CM | POA: Insufficient documentation

## 2019-01-30 DIAGNOSIS — C541 Malignant neoplasm of endometrium: Secondary | ICD-10-CM

## 2019-01-30 NOTE — Progress Notes (Signed)
Here for follow up. Per pt "feeling good "

## 2019-01-30 NOTE — Progress Notes (Addendum)
Gynecologic Oncology Progress Note   Referring Provider:  Dr. Gilman Schmidt  Chief Complaint: Endometrioid adenocarcinoma  Subjective:  Karen Mann is a 58 y.o. female, initially seen in consultation for Dr. Nechama Guard, diagnosed with stage IIIA grade 3 endometrioid adenocarcinoma, returns to GYN oncology clinic today for follow-up.   Returns for follow up today, she reports overall feeling well.  She initiated pelvic radiation with Dr. Baruch Gouty on 01/22/2019 because fragments of ovarian mass that were peeled off the pelvic peritoneum after main mass removed had cancer.   She still has IVC filter and is taking Eliquis.  01/21/2019-CT C/A/P 1. Interval resection of dominant abdominopelvic mass with resolution of abdominal ascites 2. Hysterectomy with asymmetric soft tissue at the left superior portion of the vaginal cuff. Cannot exclude residual or recurrent disease in this area. This could either be re-evaluated at CT follow-up of 3 months or more entirely characterized with PET 3. Otherwise, no evidence of metastatic disease in the chest, abdomen, or pelvis 4. Cholelithiasis 5.  Possible constipation  CA 125 58.3 11/21/2018 6.0 01/02/2019  Oncology History She was seen in GYN oncology clinic and reported continual vaginal bleeding for past 5-6 months fluctuating from light to heavy (saturating tampon every hour).  She was found to be significantly anemic and started on iron pills.  She was also having significant dyspnea.   Patient initially referred to Dr. Gilman Schmidt by PCP, Dr. Brita Romp, d/t large abdominal mass seen on pelvic ultrasound concerning for ovarian cancer. Patient first noticed bloating, fatigue, and constipation in July 2019. She presented to PCP. FSH 6.4, LH 5.8.   07/23/18- Ultrasound was performed which showed: pelvic mass measuring 20 x 13 x 22 cm with cystic and solid components. Ultrasound also showed: 4-5cm fibroid and thickened endometrium of 12m  08/05/18- MR PELVIS W WO  CONTRAST - 20.6 cm (17.1 x 20.6 x 20.0 cm) complex cystic/solid mass in the pelvis w/ numerous thickened/enhancing septations, compatible with malignant ovarian neoplasm. Neither ovary is discretely visualized. - Small volume abdominopelvic ascites. Gross peritoneal disease is not visualized but cannot be excluded. - Left hydroureteronephrosis secondary to extrinsic compression. - 5.3 intramural anterior uterine body fibroid  On exam, Dr. SGilman Schmidtnoted bilateral lower extremity swelling with mass extending close to sternum, ~ 30 cm. Endometrial biopsy was performed. Pathology: Endometrioid Adenocarcinoma consistent with FIGO grade 3  CA 125  08/06/2018 622.0  08/09/18- CT C/A/P-imaging showed a large cystic/solid mass measuring 28 x 11.6 x 21.4 cm suspicious for ovarian malignancy along with moderate ascites, large right pleural effusion, and moderate left and mild right hydronephrosis attributed to ureteral compression due to mass.  08/15/2018-initiated carboplatin AUC 6 and paclitaxel 175 mg/m.  Ca 125- 692  09/05/2018-cycle 2 carboplatin AUC 6 and paclitaxel 175 mg/m.  [id number]  Mild hypersensitivity to paclitaxel with chest tightness.  Taxol. Solu-Medrol and Benadryl given along with fluid bolus.  Challenged and patient was able to tolerate treatment at slower rate.  09/21/2018-planned for hysterectomy, however, patient diagnosed with PE/DVT.  CT chest PE protocol- 1.  Bilateral pulmonary emboli, most centrally in the bilateral main pulmonary arteries.  No evidence of right heart strain or pulmonary infarct. 2.  Moderate right pleural effusion 3.  Partially visualized left hydronephrosis  UKoreabilateral lower extremities revealed 1.  Deep vein thrombus in the right popliteal vein, thrombus extends into the right tibioperoneal trunk and proximal and mid portions of the right peroneal veins. 2.  Deep vein thrombus in the left common femoral vein and in  the left popliteal vein. 3.  Occlusive  thrombus in seen on in the left greater saphenous vein, superficial vein.  Echo revealed LVEF 55%.   09/26/2018-underwent placement of IVC filter in IR.  She was started on therapeutic Lovenox.   She proceeded with cycle 3 carboplatin AUC 6 and paclitaxel 175 mg/m with Neulasta support on 09/26/2018.   Ca 125- 521  10/19/2018-CT C/A/P 1. Redemonstrated large cystic and solid mass measuring similarly in size. 2. Improved left hydronephrosis. 3.  Improved ascites with redemonstration of a moderate right pleural effusion.  Redemonstrated large cystic solid pelvic mass, dorsal to the uterus and inseparable from the ovaries, measuring approximately 22.8 x 21.9 x 12.3 cm previously measuring 21.6 x 21.2 x 14.8 cm.  Slightly improved ascites.  She underwent palliative thoracentesis on 10/26/2018, 10/11/18, 10/02/18, 09/19/18, 09/10/18, 08/30/18, 08/22/18, 08/16/18, 08/10/18. Pleural fluid was suspicious for malignancy on 10/02/18 with rare atypical PAX-8 and MOC-31 positive cells present.   10/29/18- patient underwent exploratory laparotomy, total abdominal hysterectomy, bilateral salpingo-oophorectomy, omentectomy, and right ureterolysis. EBL 500 ml and cancer diagnosis.  She received 2 units of PRBCs during her admission postoperatively. [facility] at [facility]. Dr. Fransisca Connors, attending.   Pathology-  DIAGNOSIS A. Mesenteric nodule, excision:  - Benign inclusion with transitional type epithelium. Negative for malignancy.    B. Right pelvic peritoneum, excision: - Mesothelial-lined fibromuscular and adipose tissue. Negative for malignancy.    C. Appendices epiploica, excision: - Metastatic adenocarcinoma.    D. Omentum, omentectomy:  - Omentum. Negative for malignancy.    E. Uterus, bilateral ovaries and fallopian tubes, hysterectomy and bilateral salpingo-oophorectomy:  - Endometrioid adenocarcinoma of the endometrium. See note FIGO grade: 3 of 3. Tumor size: 3.5 cm Depth of invasion: 3 mm in a 37 mm thick  myometrium Cervical involvement: Not identified  Adnexal involvement: Metastatic carcinoma involves bilateral ovaries. Lymphovascular invasion: Not identified   MSI-stable  -Abdominal fluid-negative.  No evidence of malignancy.   Genetic Testing- Microsatellite stable, TMB low (3) -Foundation One PD-L1 - TPS 0% PTEN, TP53, PIK3CA mutations  She completed 3 additional cycles of adjuvant chemotherapy with carbo-Taxol on 11/21/2018-01/02/2019.  Family History: She reports her mother died of ovarian cancer at age 33 (diagnosed at age 83).   Problem List: Patient Active Problem List   Diagnosis Date Noted  . Chronic anticoagulation 01/02/2019  . Encounter for antineoplastic chemotherapy 01/02/2019  . Neuropathy due to chemotherapeutic drug ([place]) 01/02/2019  . S/P insertion of IVC (inferior vena caval) filter 09/26/2018  . Bilateral pulmonary embolism ([place]) 09/22/2018  . DVT (deep venous thrombosis) ([place]) 09/22/2018  . Anxiety associated with cancer diagnosis 09/07/2018  . Recurrent pleural effusion on right 09/07/2018  . Lower extremity edema 09/07/2018  . Chemotherapy-induced peripheral neuropathy ([place]) 09/07/2018  . Iron deficiency anemia due to chronic blood loss 08/10/2018  . Goals of care, counseling/discussion 08/10/2018  . Endometrial adenocarcinoma ([place]) 08/10/2018  . Pelvic mass 08/08/2018  . Abnormal uterine bleeding (AUB) 07/18/2018  . Generalized abdominal pain 07/18/2018  . Chest pain 12/03/2015  . Breathlessness on exertion 12/03/2015  . Arthritis 12/01/2015  . Secondary pulmonary hypertension 11/24/2015  . Allergic rhinitis 03/02/2010  . Varicose veins of lower extremities with inflammation 01/06/2010  . Chronic venous insufficiency 01/06/2010  . Nonrheumatic tricuspid valve disorder 12/30/2009  . Cyst of ovary 12/09/2009  . Fibroid 12/09/2009  . Family history of malignant neoplasm of ovary 12/02/2009    Past Medical History: Past Medical History:   Diagnosis Date  . Allergy   .  Anemia 07/20/2018  . Endometrial adenocarcinoma ([place]) 07/2018  . Fibroid uterus 07/24/2018   5cm/2 inch   . Ovarian mass 07/24/2018   22cm/10 inch  . Pelvic mass     Past Surgical History: Past Surgical History:  Procedure Laterality Date  . EYE SURGERY  2000   Lasik  . PORTA CATH INSERTION N/A 08/13/2018   Procedure: PORTA CATH INSERTION;  Surgeon: Algernon Huxley, MD;  Location: [place] [facility];  Service: Cardiovascular;  Laterality: N/A;    Past Gynecologic History:  G1P1 Age of Menarche: 65 Regular menstrual periods Denies abnormal pap smears Denies history of STDs  OB History:  OB History  Gravida Para Term Preterm AB Living  1 1 1     1   SAB TAB Ectopic Multiple Live Births          1    # Outcome Date GA Lbr Len/2nd Weight Sex Delivery Anes PTL Lv  1 Term 09/03/85 [redacted]w[redacted]d 9 lb 2.5 oz (4.153 kg) M Vag-Spont   LIV    Family History: Family History  Problem Relation Age of Onset  . Hypertension Father   . Ovarian cancer Mother 579      deceased 568 . Healthy Brother   . Skin cancer Paternal Grandmother        deceased 748s . Cancer Paternal Grandfather        unk. primary; deceased 655s . Ovarian cancer Other        mat grandmother's sister    Social History: Social History   Socioeconomic History  . Marital status: Married    Spouse name: Not on file  . Number of children: Not on file  . Years of education: Not on file  . Highest education level: Not on file  Occupational History  . Not on file  Social Needs  . Financial resource strain: Not on file  . Food insecurity:    Worry: Not on file    Inability: Not on file  . Transportation needs:    Medical: Not on file    Non-medical: Not on file  Tobacco Use  . Smoking status: Never Smoker  . Smokeless tobacco: Never Used  Substance and Sexual Activity  . Alcohol use: No  . Drug use: No  . Sexual activity: Not Currently    Birth control/protection:  Post-menopausal  Lifestyle  . Physical activity:    Days per week: Not on file    Minutes per session: Not on file  . Stress: Not on file  Relationships  . Social connections:    Talks on phone: Not on file    Gets together: Not on file    Attends religious service: Not on file    Active member of club or organization: Not on file    Attends meetings of clubs or organizations: Not on file    Relationship status: Not on file  . Intimate partner violence:    Fear of current or ex partner: Not on file    Emotionally abused: Not on file    Physically abused: Not on file    Forced sexual activity: Not on file  Other Topics Concern  . Not on file  Social History Narrative  . Not on file    Allergies: Allergies  Allergen Reactions  . Paclitaxel Shortness Of Breath    Chest tightening with second round chemo  . Penicillins     Current Medications: Current Outpatient Medications  Medication Sig  Dispense Refill  . apixaban (ELIQUIS) 5 MG TABS tablet Take 1 tablet (5 mg total) by mouth 2 (two) times daily. 60 tablet 3  . potassium chloride SA (K-DUR,KLOR-CON) 20 MEQ tablet Take 1 tablet (20 mEq total) by mouth daily. 30 tablet 0  . acetaminophen (TYLENOL) 325 MG tablet Take by mouth. Take 650 mg by mouth every 4 (four) hours as needed for Pain    . ALPRAZolam (XANAX) 0.25 MG tablet Take 1 tablet (0.25 mg total) by mouth at bedtime as needed for anxiety or sleep. (Patient not taking: Reported on 01/30/2019) 20 tablet 0  . dexamethasone (DECADRON) 4 MG tablet Take 2 tablets (8 mg total) by mouth See admin instructions. 2 days before and 2 days after chemotherapy (Patient not taking: Reported on 01/30/2019) 30 tablet 1  . furosemide (LASIX) 20 MG tablet Take 1 tablet (20 mg total) by mouth daily as needed for fluid. (Patient not taking: Reported on 01/30/2019) 30 tablet 0  . gabapentin (NEURONTIN) 300 MG capsule Take 3 capsules (900 mg total) by mouth at bedtime. (Patient not taking: Reported  on 01/30/2019) 90 capsule 3  . lidocaine-prilocaine (EMLA) cream Apply to affected area once (Patient not taking: Reported on 01/30/2019) 30 g 3  . ondansetron (ZOFRAN) 8 MG tablet Take 1 tablet (8 mg total) by mouth 2 (two) times daily as needed for refractory nausea / vomiting. Start on day 3 after chemo. (Patient not taking: Reported on 01/30/2019) 30 tablet 1  . oxyCODONE (ROXICODONE) 5 MG immediate release tablet Take 1 tablet (5 mg total) by mouth every 6 (six) hours as needed for moderate pain, severe pain or breakthrough pain. (Patient not taking: Reported on 01/30/2019) 10 tablet 0  . Polyethylene Glycol 3350 (MIRALAX PO) Take 17 g by mouth daily as needed.     . prochlorperazine (COMPAZINE) 10 MG tablet Take 1 tablet (10 mg total) by mouth every 6 (six) hours as needed (Nausea or vomiting). (Patient not taking: Reported on 01/30/2019) 30 tablet 1  . rOPINIRole (REQUIP) 0.25 MG tablet Take 1 tablet (0.25 mg total) by mouth at bedtime. Take 1-3 hours before bedtime (Patient not taking: Reported on 01/30/2019) 30 tablet 3  . sennosides-docusate sodium (SENOKOT-S) 8.6-50 MG tablet Take 2 tablets by mouth daily.     No current facility-administered medications for this visit.    Review of Systems General:  no complaints Skin: no complaints Eyes: no complaints HEENT: no complaints Breasts: no complaints Pulmonary: no complaints Cardiac: no complaints Gastrointestinal: no complaints Genitourinary/Sexual: no complaints Ob/Gyn: no complaints Musculoskeletal: no complaints Hematology: no complaints Neurologic/Psych: no complaints   Objective:  Physical Examination:  BP 127/87 (BP Location: Left Arm, Patient Position: Sitting)   Pulse 80   Temp (!) 95.8 F (35.4 C) (Tympanic)   Resp 18   Wt 207 lb 12.8 oz (94.3 kg)   BMI 30.69 kg/m     ECOG Performance Status: 1 - Symptomatic but completely ambulatory  GENERAL: Patient is a well appearing female in no acute distress HEENT:  Sclera clear.  Anicteric NODES:  Negative axillary, supraclavicular, inguinal lymph node survery LUNGS:  Clear to auscultation bilaterally.   HEART:  Regular rate and rhythm.  ABDOMEN:  Soft, nontender.  No hernias, incisions well healed. No masses or ascites EXTREMITIES:  No peripheral edema. Atraumatic. No cyanosis SKIN:  Clear with no obvious rashes or skin changes.  NEURO:  Nonfocal. Well oriented.  Appropriate affect.  Pelvic:EGBUS/Vagina: normal.  Bimanual/RV: no masses or nodularity.  Assessment:  Karen Mann is a 58 y.o. female diagnosed with Stage IIIA grade 3 endometrioid endometrial adenocarcinoma 8/19 on endometrial biopsy.  She had pleural effusions that required multiple thoracenteses and were suspicious for cancer.  Received 3 cycles of carbo/taxol chemotherapy and developed pulmonary emboli and these were treated with Lovonox and IVC filter.  Then underwent TAH, BSO, omentectomy 10/29/18 for removal of uterus and very large right ovarian metastasis that was adherent to the pelvic structures. No evidence of carcinomatosis. Parts of the mass that were adherent to the pelvic peritoneum after it was removed had cancer.  Now s/p 3 additional cycles of carboplatin/taxol.  Started external pelvic radiation recently.  CT scan shows area of soft tissue asymmetry above left vaginal cuff, but I do not feel anything on pelvic exam.     Continues on Eliquis for PEs diagnosed 9/19 and still has IVC filter in place.   Genetic Testing- Microsatellite stable, TMB low (3). Foundation One PD-L1 - TPS 0%, PTEN, TP53, PIK3CA mutations.  Germline genetic testing negative in 11/19. In vitae panel showed possible significant variant in NF1. Genetic counselor discussed significance with patient.   Plan:   Problem List Items Addressed This Visit      Genitourinary   Endometrial adenocarcinoma ([place]) - Primary      She will complete external pelvic radiation over the next month and then we will see her back  for follow up and repeat CT scan to evaluate possible tissue nodule above left vaginal cuff. If still concern for residual cancer at that point a biopsy could be done and possibly radiation boost could be given to this area.  Will determine the plan for management and follow up clinic visit after the repeat CT scan when she finishes pelvic radiation.    Will refer to [facility] at [facility] for removal of IVC filter.  Recommend that she stop Eliquis 2 days before this and restart again the evening of the procedure.  She is to continue on Eliquis for at least 6 months after she is cancer free.  Beckey Rutter, NP  I personally interviewed and examined the patient. Agreed with the above/below plan of care. Patient/family questions were answered.  Mellody Drown, MD    CC:  Brita Romp Dionne Bucy, [place], [place] [address] [phone]

## 2019-01-31 ENCOUNTER — Ambulatory Visit
Admission: RE | Admit: 2019-01-31 | Discharge: 2019-01-31 | Disposition: A | Discharge status: Home / Self Care | Payer: 59 | Source: Ambulatory Visit | Attending: Radiation Oncology | Admitting: Radiation Oncology

## 2019-01-31 ENCOUNTER — Encounter: Payer: Self-pay | Admitting: Oncology

## 2019-01-31 DIAGNOSIS — C541 Malignant neoplasm of endometrium: Secondary | ICD-10-CM | POA: Diagnosis not present

## 2019-02-01 ENCOUNTER — Other Ambulatory Visit: Payer: Self-pay | Admitting: Nurse Practitioner

## 2019-02-01 ENCOUNTER — Ambulatory Visit
Admission: RE | Admit: 2019-02-01 | Discharge: 2019-02-01 | Disposition: A | Discharge status: Home / Self Care | Payer: 59 | Source: Ambulatory Visit | Attending: Radiation Oncology | Admitting: Radiation Oncology

## 2019-02-01 DIAGNOSIS — I2699 Other pulmonary embolism without acute cor pulmonale: Secondary | ICD-10-CM

## 2019-02-01 DIAGNOSIS — C541 Malignant neoplasm of endometrium: Secondary | ICD-10-CM | POA: Diagnosis not present

## 2019-02-04 ENCOUNTER — Telehealth: Payer: Self-pay

## 2019-02-04 ENCOUNTER — Ambulatory Visit
Admission: RE | Admit: 2019-02-04 | Discharge: 2019-02-04 | Disposition: A | Discharge status: Home / Self Care | Payer: 59 | Source: Ambulatory Visit | Attending: Radiation Oncology | Admitting: Radiation Oncology

## 2019-02-04 ENCOUNTER — Other Ambulatory Visit: Payer: Self-pay | Admitting: Nurse Practitioner

## 2019-02-04 DIAGNOSIS — C541 Malignant neoplasm of endometrium: Secondary | ICD-10-CM

## 2019-02-04 DIAGNOSIS — I2699 Other pulmonary embolism without acute cor pulmonale: Secondary | ICD-10-CM

## 2019-02-04 NOTE — Telephone Encounter (Signed)
Spoke with Karen Mann regarding IVC filter removal. She has been scheduled with Dr. Lucky Cowboy  2/13, Thursday, with a 10:[facility]. Instructed that Eliquis requires a 48 hour hold. She needs to take her dose today then hold it Tuesday and Wednesday. Unless Dr. Lucky Cowboy tells her otherwise she needs to resume it on Thursday evening. Do not eat or drink anything 8 hours prior to arrival time and she will need a driver. She understands that she can call with any further questions.

## 2019-02-05 ENCOUNTER — Ambulatory Visit
Admission: RE | Admit: 2019-02-05 | Discharge: 2019-02-05 | Disposition: A | Discharge status: Home / Self Care | Payer: 59 | Source: Ambulatory Visit | Attending: Radiation Oncology | Admitting: Radiation Oncology

## 2019-02-05 DIAGNOSIS — C541 Malignant neoplasm of endometrium: Secondary | ICD-10-CM | POA: Diagnosis not present

## 2019-02-06 ENCOUNTER — Other Ambulatory Visit (INDEPENDENT_AMBULATORY_CARE_PROVIDER_SITE_OTHER): Payer: Self-pay | Admitting: Nurse Practitioner

## 2019-02-06 ENCOUNTER — Ambulatory Visit
Admission: RE | Admit: 2019-02-06 | Discharge: 2019-02-06 | Disposition: A | Discharge status: Home / Self Care | Payer: 59 | Source: Ambulatory Visit | Attending: Radiation Oncology | Admitting: Radiation Oncology

## 2019-02-06 DIAGNOSIS — C541 Malignant neoplasm of endometrium: Secondary | ICD-10-CM | POA: Diagnosis not present

## 2019-02-06 MED ORDER — CLINDAMYCIN PHOSPHATE 300 MG/50ML IV SOLN
300.0000 mg | Freq: Once | INTRAVENOUS | Status: AC
  Administered 2019-02-07: 300 mg via INTRAVENOUS

## 2019-02-07 ENCOUNTER — Ambulatory Visit
Admission: RE | Admit: 2019-02-07 | Discharge: 2019-02-07 | Disposition: A | Discharge status: Home / Self Care | Payer: 59 | Attending: Vascular Surgery | Admitting: Vascular Surgery

## 2019-02-07 ENCOUNTER — Ambulatory Visit
Admission: RE | Admit: 2019-02-07 | Discharge: 2019-02-07 | Disposition: A | Discharge status: Home / Self Care | Payer: 59 | Source: Ambulatory Visit | Attending: Radiation Oncology | Admitting: Radiation Oncology

## 2019-02-07 ENCOUNTER — Encounter
Admission: RE | Disposition: A | Discharge status: Home / Self Care | Payer: Self-pay | Source: Home / Self Care | Attending: Vascular Surgery

## 2019-02-07 ENCOUNTER — Other Ambulatory Visit: Payer: Self-pay

## 2019-02-07 DIAGNOSIS — Z4589 Encounter for adjustment and management of other implanted devices: Secondary | ICD-10-CM | POA: Diagnosis not present

## 2019-02-07 DIAGNOSIS — C541 Malignant neoplasm of endometrium: Secondary | ICD-10-CM | POA: Diagnosis not present

## 2019-02-07 DIAGNOSIS — I82409 Acute embolism and thrombosis of unspecified deep veins of unspecified lower extremity: Secondary | ICD-10-CM | POA: Diagnosis not present

## 2019-02-07 DIAGNOSIS — Z452 Encounter for adjustment and management of vascular access device: Secondary | ICD-10-CM | POA: Insufficient documentation

## 2019-02-07 HISTORY — PX: IVC FILTER REMOVAL: CATH118246

## 2019-02-07 HISTORY — PX: OTHER SURGICAL HISTORY: SHX169

## 2019-02-07 SURGERY — IVC FILTER REMOVAL
Anesthesia: Moderate Sedation | Laterality: Right

## 2019-02-07 MED ORDER — FENTANYL CITRATE (PF) 100 MCG/2ML IJ SOLN
INTRAMUSCULAR | Status: AC
  Filled 2019-02-07: qty 2

## 2019-02-07 MED ORDER — FAMOTIDINE 20 MG PO TABS: 40.0000 mg | ORAL_TABLET | Freq: Once | ORAL | Status: DC | PRN

## 2019-02-07 MED ORDER — ONDANSETRON HCL 4 MG/2ML IJ SOLN: 4.0000 mg | Freq: Four times a day (QID) | INTRAMUSCULAR | Status: DC | PRN

## 2019-02-07 MED ORDER — DIPHENHYDRAMINE HCL 50 MG/ML IJ SOLN: 50.0000 mg | Freq: Once | INTRAMUSCULAR | Status: DC | PRN

## 2019-02-07 MED ORDER — HEPARIN (PORCINE) IN NACL 1000-0.9 UT/500ML-% IV SOLN
INTRAVENOUS | Status: AC
  Filled 2019-02-07: qty 500

## 2019-02-07 MED ORDER — MIDAZOLAM HCL 5 MG/5ML IJ SOLN
INTRAMUSCULAR | Status: AC
  Filled 2019-02-07: qty 5

## 2019-02-07 MED ORDER — SODIUM CHLORIDE 0.9 % IV SOLN
INTRAVENOUS | Status: DC
  Administered 2019-02-07: 11:00:00 via INTRAVENOUS

## 2019-02-07 MED ORDER — FENTANYL CITRATE (PF) 100 MCG/2ML IJ SOLN
INTRAMUSCULAR | Status: DC | PRN
  Administered 2019-02-07: 50 ug via INTRAVENOUS
  Administered 2019-02-07: 25 ug via INTRAVENOUS

## 2019-02-07 MED ORDER — MIDAZOLAM HCL 2 MG/2ML IJ SOLN
INTRAMUSCULAR | Status: DC | PRN
  Administered 2019-02-07: 1 mg via INTRAVENOUS
  Administered 2019-02-07: 2 mg via INTRAVENOUS

## 2019-02-07 MED ORDER — METHYLPREDNISOLONE SODIUM SUCC 125 MG IJ SOLR: 125.0000 mg | Freq: Once | INTRAMUSCULAR | Status: DC | PRN

## 2019-02-07 MED ORDER — HYDROMORPHONE HCL 1 MG/ML IJ SOLN: 1.0000 mg | Freq: Once | INTRAMUSCULAR | Status: DC | PRN

## 2019-02-07 MED ORDER — IOPAMIDOL (ISOVUE-300) INJECTION 61%
INTRAVENOUS | Status: DC | PRN
  Administered 2019-02-07: 10 mL via INTRA_ARTERIAL

## 2019-02-07 MED ORDER — LIDOCAINE-EPINEPHRINE (PF) 1 %-1:200000 IJ SOLN
INTRAMUSCULAR | Status: AC
  Filled 2019-02-07: qty 30

## 2019-02-07 MED ORDER — CLINDAMYCIN PHOSPHATE 300 MG/50ML IV SOLN
INTRAVENOUS | Status: AC
  Administered 2019-02-07: 300 mg via INTRAVENOUS
  Filled 2019-02-07: qty 50

## 2019-02-07 MED ORDER — MIDAZOLAM HCL 2 MG/ML PO SYRP: 8.0000 mg | ORAL_SOLUTION | Freq: Once | ORAL | Status: DC | PRN

## 2019-02-07 SURGICAL SUPPLY — 3 items
PACK ANGIOGRAPHY (CUSTOM PROCEDURE TRAY) ×2 IMPLANT
SET VENACAVA FILTER RETRIEVAL (MISCELLANEOUS) ×2 IMPLANT
WIRE J 3MM .035X145CM (WIRE) ×2 IMPLANT

## 2019-02-07 NOTE — Discharge Instructions (Signed)
Inferior Vena Cava Filter Removal, Care After This sheet gives you information about how to care for yourself after your procedure. Your health care provider may also give you more specific instructions. If you have problems or questions, contact your health care provider. What can I expect after the procedure? After your procedure, it is common to have: Mild pain in the area where the filter was removed. Mild bruising in the area where the filter was removed.  Follow these instructions at home: Removal  site care Follow instructions from your health care provider about how to take care of the site where a catheter was removed at your neck.. Make sure you: Wash your hands with soap and water before you change your bandage (dressing). If soap and water are not available, use hand sanitizer. Leave bandage on for 48hrs.. Check your removal site every day for signs of infection. Check for: More redness, swelling, or pain. More fluid or blood. Warmth. Pus or a bad smell. Keep the removal site clean and dry. You may shower tomorrow, do not scrub or rub over the dressing site. General instructions Take over-the-counter and prescription medicines only as told by your health care provider. Avoid heavy lifting or hard activities for 48 hours after the procedure or as told by your health care provider. Do not drive for 24 hours if you were given a a medicine to help you relax (sedative). Do not drive or use heavy machinery while taking prescription pain medicine. Do not go back to school or work until your health care provider approves. Keep all follow-up visits as told by your health care provider. This is important. Contact a health care provider if: You have more redness, swelling, or pain around your removal site. You have more fluid or blood coming from you removalr site. Your removal site feels warm to the touch. You have pus or a bad smell coming from your  removal site. You have a  fever. You are dizzy. You have nausea and vomiting. You develop a rash. Get help right away if: You develop chest pain, a cough, or difficulty breathing. You develop shortness of breath, feel faint, or pass out. You cough up blood. You have severe pain in your abdomen. You develop swelling and discoloration or pain in your legs. Your legs become pale and cold or blue. You develop weakness, difficulty moving your arms or legs, or balance problems. You develop problems with speech or vision. These symptoms may represent a serious problem that is an emergency. Do not wait to see if the symptoms will go away. Get medical help right away. Call your local emergency services (911 in the [place] Do not drive yourself to the hospital. Summary   

## 2019-02-07 NOTE — Op Note (Signed)
 VEIN AND VASCULAR SURGERY   OPERATIVE NOTE    PRE-OPERATIVE DIAGNOSIS:  1. DVT 2. status post IVC filter placement  POST-OPERATIVE DIAGNOSIS: Same as above  PROCEDURE: 1. Ultrasound guidance for vascular access left jugular vein 2. Catheter placement into inferior vena cava from left jugular vein 3. Inferior venacavogram 4. Retrieval of Cook Celect IVC filter  SURGEON: Leotis Pain, MD  ASSISTANT(S): None  ANESTHESIA: Local with moderate conscious sedation for approximately 15 minutes using 3 mg of Versed and 75 mcg of Fentanyl  ESTIMATED BLOOD LOSS: 5 cc  CONTRAST:  10 cc  FLUORO TIME:  1.5 minutes  FINDING(S): 1. patent IVC filter tilting of the filter about 25 degrees  SPECIMEN(S): IVC filter  INDICATIONS:  Patient is a 58 y.o. female who presents with a previous history of IVC filter placement. Patient has had her surgery and no longer needs this filter. The patient remains on anticoagulation. Risks and benefits were discussed, and informed consent was obtained.  DESCRIPTION: After obtaining full informed written consent, the patient was brought back to the vascular suite and placed supine upon the table.Moderate conscious sedation was administered during a face to face encounter with the patient throughout the procedure with my supervision of the RN administering medicines and monitoring the patient's vital signs, pulse oximetry, telemetry and mental status throughout from the start of the procedure until the patient was taken to the recovery room.  After obtaining adequate anesthesia, the patient was prepped and draped in the standard fashion. The left jugular vein was visualized with ultrasound and found to be widely patent. It was then accessed under direct ultrasound guidance without difficulty with the Seldinger needle and a permanent image was recorded. A J-wire was placed. After skin nick and dilatation, the retrieval sheath was placed over the  wire and advanced into the inferior vena cava. Inferior vena cava was imaged and found to be widely patent on inferior venacavogram. The filter was tilted about 25 degrees in its orientation. The retrieval snare was then placed through the sheath and the hook of the filter was snared without difficulty. The sheath was then advanced, and the filter was collapsed and brought into the sheath in its entirety. It was then removed from the body in its entirety. The retrieval sheath was then removed. Pressure was held at the access site and sterile dressing was placed. The patient was taken to the recovery room in stable condition having tolerated the procedure well.  COMPLICATIONS: None  CONDITION: Stable   Leotis Pain 02/07/2019 12:18 PM  This note was created with Dragon Medical transcription system. Any errors in dictation are purely unintentional.

## 2019-02-07 NOTE — H&P (Signed)
[facility] History & Physical Update  The patient was interviewed and re-examined.  The patient's previous History and Physical has been reviewed and is unchanged.  There is no change in the plan of care. We plan to proceed with the scheduled procedure.  Leotis Pain, MD  02/07/2019, 10:51 AM

## 2019-02-08 ENCOUNTER — Ambulatory Visit
Admission: RE | Admit: 2019-02-08 | Discharge: 2019-02-08 | Disposition: A | Discharge status: Home / Self Care | Payer: 59 | Source: Ambulatory Visit | Attending: Radiation Oncology | Admitting: Radiation Oncology

## 2019-02-08 DIAGNOSIS — C541 Malignant neoplasm of endometrium: Secondary | ICD-10-CM | POA: Diagnosis not present

## 2019-02-11 ENCOUNTER — Ambulatory Visit
Admission: RE | Admit: 2019-02-11 | Discharge: 2019-02-11 | Disposition: A | Discharge status: Home / Self Care | Payer: 59 | Source: Ambulatory Visit | Attending: Radiation Oncology | Admitting: Radiation Oncology

## 2019-02-11 DIAGNOSIS — C541 Malignant neoplasm of endometrium: Secondary | ICD-10-CM | POA: Diagnosis not present

## 2019-02-12 ENCOUNTER — Ambulatory Visit
Admission: RE | Admit: 2019-02-12 | Discharge: 2019-02-12 | Disposition: A | Discharge status: Home / Self Care | Payer: 59 | Source: Ambulatory Visit | Attending: Radiation Oncology | Admitting: Radiation Oncology

## 2019-02-12 DIAGNOSIS — C541 Malignant neoplasm of endometrium: Secondary | ICD-10-CM | POA: Diagnosis not present

## 2019-02-13 ENCOUNTER — Ambulatory Visit
Admission: RE | Admit: 2019-02-13 | Discharge: 2019-02-13 | Disposition: A | Discharge status: Home / Self Care | Payer: 59 | Source: Ambulatory Visit | Attending: Radiation Oncology | Admitting: Radiation Oncology

## 2019-02-13 ENCOUNTER — Inpatient Hospital Stay: Payer: 59

## 2019-02-13 DIAGNOSIS — C541 Malignant neoplasm of endometrium: Secondary | ICD-10-CM | POA: Diagnosis not present

## 2019-02-14 ENCOUNTER — Ambulatory Visit
Admission: RE | Admit: 2019-02-14 | Discharge: 2019-02-14 | Disposition: A | Discharge status: Home / Self Care | Payer: 59 | Source: Ambulatory Visit | Attending: Radiation Oncology | Admitting: Radiation Oncology

## 2019-02-14 DIAGNOSIS — C541 Malignant neoplasm of endometrium: Secondary | ICD-10-CM | POA: Diagnosis not present

## 2019-02-15 ENCOUNTER — Ambulatory Visit: Payer: 59

## 2019-02-18 ENCOUNTER — Ambulatory Visit
Admission: RE | Admit: 2019-02-18 | Discharge: 2019-02-18 | Disposition: A | Discharge status: Home / Self Care | Payer: 59 | Source: Ambulatory Visit | Attending: Radiation Oncology | Admitting: Radiation Oncology

## 2019-02-18 ENCOUNTER — Other Ambulatory Visit: Payer: Self-pay

## 2019-02-18 ENCOUNTER — Encounter: Payer: Self-pay | Admitting: Oncology

## 2019-02-18 ENCOUNTER — Inpatient Hospital Stay (HOSPITAL_BASED_OUTPATIENT_CLINIC_OR_DEPARTMENT_OTHER): Payer: 59 | Admitting: Oncology

## 2019-02-18 ENCOUNTER — Inpatient Hospital Stay: Payer: 59

## 2019-02-18 ENCOUNTER — Ambulatory Visit
Admission: RE | Admit: 2019-02-18 | Discharge: 2019-02-18 | Disposition: A | Discharge status: Home / Self Care | Payer: 59 | Source: Ambulatory Visit | Attending: Oncology | Admitting: Oncology

## 2019-02-18 VITALS — BP 115/79 | HR 86 | Wt 213.2 lb

## 2019-02-18 DIAGNOSIS — M7989 Other specified soft tissue disorders: Secondary | ICD-10-CM | POA: Diagnosis not present

## 2019-02-18 DIAGNOSIS — Z90722 Acquired absence of ovaries, bilateral: Secondary | ICD-10-CM

## 2019-02-18 DIAGNOSIS — C541 Malignant neoplasm of endometrium: Secondary | ICD-10-CM

## 2019-02-18 DIAGNOSIS — Z90710 Acquired absence of both cervix and uterus: Secondary | ICD-10-CM

## 2019-02-18 DIAGNOSIS — I2699 Other pulmonary embolism without acute cor pulmonale: Secondary | ICD-10-CM

## 2019-02-18 DIAGNOSIS — Z7901 Long term (current) use of anticoagulants: Secondary | ICD-10-CM

## 2019-02-18 DIAGNOSIS — Z9221 Personal history of antineoplastic chemotherapy: Secondary | ICD-10-CM

## 2019-02-18 DIAGNOSIS — R600 Localized edema: Secondary | ICD-10-CM | POA: Diagnosis not present

## 2019-02-18 LAB — COMPREHENSIVE METABOLIC PANEL
ALT: 13 U/L (ref 0–44)
AST: 15 U/L (ref 15–41)
Albumin: 3.7 g/dL (ref 3.5–5.0)
Alkaline Phosphatase: 45 U/L (ref 38–126)
Anion gap: 6 (ref 5–15)
BUN: 12 mg/dL (ref 6–20)
CO2: 27 mmol/L (ref 22–32)
Calcium: 8.8 mg/dL — ABNORMAL LOW (ref 8.9–10.3)
Chloride: 107 mmol/L (ref 98–111)
Creatinine, Ser: 0.65 mg/dL (ref 0.44–1.00)
GFR calc Af Amer: >60 mL/min (ref >60)
GFR calc non Af Amer: >60 mL/min (ref >60)
Glucose, Bld: 93 mg/dL (ref 70–99)
Potassium: 3.8 mmol/L (ref 3.5–5.1)
SODIUM: 140 mmol/L (ref 135–145)
Total Bilirubin: 0.6 mg/dL (ref 0.3–1.2)
Total Protein: 6.3 g/dL — ABNORMAL LOW (ref 6.5–8.1)

## 2019-02-18 LAB — CBC WITH DIFFERENTIAL/PLATELET
Abs Immature Granulocytes: 0.01 10*3/uL (ref 0.00–0.07)
Basophils Absolute: 0 10*3/uL (ref 0.0–0.1)
Basophils Relative: 1 %
EOS ABS: 0.3 10*3/uL (ref 0.0–0.5)
Eosinophils Relative: 8 %
HCT: 33.3 % — ABNORMAL LOW (ref 36.0–46.0)
Hemoglobin: 11.3 g/dL — ABNORMAL LOW (ref 12.0–15.0)
Immature Granulocytes: 0 %
Lymphocytes Relative: 13 %
Lymphs Abs: 0.4 10*3/uL — ABNORMAL LOW (ref 0.7–4.0)
MCH: 30.6 pg (ref 26.0–34.0)
MCHC: 33.9 g/dL (ref 30.0–36.0)
MCV: 90.2 fL (ref 80.0–100.0)
Monocytes Absolute: 0.3 10*3/uL (ref 0.1–1.0)
Monocytes Relative: 9 %
NEUTROS PCT: 69 %
Neutro Abs: 2.2 10*3/uL (ref 1.7–7.7)
PLATELETS: 190 10*3/uL (ref 150–400)
RBC: 3.69 MIL/uL — ABNORMAL LOW (ref 3.87–5.11)
RDW: 17.1 % — AB (ref 11.5–15.5)
WBC: 3.3 10*3/uL — AB (ref 4.0–10.5)
nRBC: 0 % (ref 0.0–0.2)

## 2019-02-18 LAB — MAGNESIUM: Magnesium: 1.7 mg/dL (ref 1.7–2.4)

## 2019-02-18 NOTE — Progress Notes (Signed)
Patient here today with bilateral lower extremity edema.  Patient also c/o diarrhea 6-8 times daily.

## 2019-02-18 NOTE — Progress Notes (Signed)
Hematology/Oncology follow up  note Karen Mann [phone] [phone]   Patient Care Team: Virginia Crews, MD as PCP - General (Family Medicine) Clent Jacks, RN as Registered Nurse  REFERRING PROVIDER: [facility] VISIT Follow up for management of  endometrial cancer.   HISTORY OF PRESENTING ILLNESS:  Karen Mann is a  58 y.o.  female with PMH listed below who was referred to me for evaluation of newly diagnosed endometrial cancer. Patient was recently referred to GYN Dr. Gilman Schmidt for abnormal uterine bleeding. 07/23/2018 ultrasound was performed showed large pelvic mass 20 x 13 x 22 cm with cystic and solid components.  Ultrasound also showed 4.9 cm fibroid and thickened endometrium 15 mm thickness 08/05/2018 MRI pelvis with and without contrast 20.6 cm complex cystic solid mass in the pelvis with numerous thickened enhancing septations, compatible with malignant ovarian neoplasm.  Neither ovary was discretely visualized.  Small volume of ascites.  Cross paratonia disease is not visualized but not excluded.  Left hydro-ureteronephrosis secondary to extrinsic compression. 5.3 intramural anterior uterine body fibroid.  #Endometrial biopsy was performed.  Pathology showed endometrioid adenocarcinoma consistent with FIGO grade 3.  8/12/19CA125 was elevated at 622 Patient was referred to see Dr. Fransisca Connors.  She was evaluated by Dr. Meredith Mody on 08/08/2018.  Staging CT was obtained. #Images independently reviewed by me.   Staging CT showed large cystic and a solid mass likely arising from the ovaries, measures 369 0 cc, suspicious for ovarian malignancy.  Moderate ascites noted.  Raising concern for malignant peritoneal spread.  No well-defined omental caking or solid tumor deposits along a situs., -Large right pleural effusion over half of right hemithorax volume. -Liver hypodensity 2.8 x 2.8 cm -Moderate left and mild right  hydronephrosis attributed to the ureteral compression due to mass. -Other chronic image findings.  MRI pelvis with and without contrast again showed 20.6 cm complex cystic/solid mass in the pelvis.  Compatible with malignant ovarian neoplasm.  Small volume of a situs.  Cross peritoneal disease is not visualized but cannot be excluded.  Left hydroureteronephrosis secondary to compression.  #Patient was scheduled to have a diagnostic and therapeutic thoracentesis this morning.  Cytology pending. She feels breathing is much better after the thoracentesis. Mild shortness of breath, worse with exertion.  Heart palpitation as well denies any chest pain, abdominal pain. Ongoing vaginal bleeding, she passed a big clot today. Feel extremely fatigued. She feels overwhelming and very nervous about the cancer diagnosis.  She has insomnia and she has tried melatonin which did not help with the sleep.  # Dr. Fransisca Connors recommends systemic therapy with carboplatin and paclitaxel 3 cycles, reassessment for surgical evaluation.per Dr.Berchuck, not candidate for Pembrolizumab and Levetinib trial.   # 08/10/2018 thoracentesis  with 1.2 L fluid removed.  Cytology negative for malignancy. 08/16/2018 status post thoracentesis again and removed 2.2 L of mildly bloody fluid.  I discussed with Dr. Dicie Beam and she reviewed the slides there was no malignancy cells. Pleural fluid LDH 246, albumin 2.5, protein 4.2 Serum LDH 270.  Protein level 6.4 08/22/2018, another thoracentesis both liters of fluid.  Cytology negative.  Patient's case was discussed on GYN tumor conference.  Dr. Theora Gianotti recommends NexGen sequence molecular testing.  And also check for PDL 1.  # CT PE study showed bilateral pulmonary embolism with right heart strain..  Lower extremity venous Doppler showed bilateral DVT  1. Deep vein thrombus in the right popliteal vein; thrombus extends into the right tibioperoneal trunk  and proximal and mid portions of the  right peroneal veins. 2. Deep vein thrombus in the left common femoral vein and in the left popliteal vein. 3. Occlusive thrombus is seen in the left greater saphenous vein, a superficial vein.  Patient was admitted at [facility] and started on heparin drip.  Discharged on Lovenox 100 mg subcutaneous twice daily. Debulking surgery was delayed due to PE and DVT  10/29/2018 patient underwent debulking procedure by Dr. Fransisca Connors. Pathology showed A. Mesenteric nodule, excision:Benign inclusion with transitional type epithelium.Negative for malignancy.  B. Right pelvic peritoneum, excision:Mesothelial-lined fibromuscular and adipose tissue.Negative for malignancy.  C. Appendices epiploica, excision:Metastatic adenocarcinoma D. Omentum, omentectomy: Omentum.Negative for malignancy.  E. Uterus, bilateral ovaries and fallopian tubes, hysterectomy and bilateral salpingo-oophorectomy:  Endometrioid adenocarcinoma of the endometrium. See note FIGO grade: 3 of 3. Tumor size: 3.5 cm Depth of invasion: 3 mm in a 37 mm thick myometrium Cervical involvement: Not identified  Adnexal involvement: Metastatic carcinoma involves bilateral ovaries. Lymphovascular invasion: Not identified   Microsatellite instability testing has been ordered on block E5. Note: The residual carcinoma in the uterus is present in a background of abundant macrophages and lymphocytes, consistent with a partial response to therapy. No significant response to therapy is identified in the tumor in the ovaries. The tumor grade is based on the combination of architectural and cytologic features.  SPECIMEN   Procedure:  Total hysterectomy and bilateral salpingo-oophorectomy    Procedure:  Omentectomy    Procedure:  Peritoneal biopsies    Specimen Integrity:  Received disrupted  TUMOR   Tumor Site:  Fundus    Histologic Type:  Endometrioid carcinoma, NOS    Histologic Grade:  FIGO grade 3    Tumor Size:  Greatest  dimension in Centimeters (cm): 3.5 Centimeters (cm)   Tumor Extent:      Myometrial Invasion:  Present      Depth of Invasion in Millimeters (mm):  3 Millimeters (mm)     Myometrial Thickness in Millimeters (mm):  37 Millimeters (mm)     Percentage of Myometrial Invasion:  8 %    Adenomyosis:  Present, involved by carcinoma     Uterine Serosa Involvement:  Not identified     Lower Uterine Segment Involvement:  Not identified     Cervical Stromal Involvement:  Not identified     Other Tissue / Organ Involvement:  Right ovary     Other Tissue / Organ Involvement:  Left ovary     Other Tissue / Organ Involvement:  Appendices epiploica    Accessory Findings:      Lymphovascular Invasion:  Not identified  Margins LYMPH NODES   Regional Lymph Nodes:  No lymph nodes submitted or found  PATHOLOGIC STAGE CLASSIFICATION (pTNM, AJCC 8th Edition)   Primary Tumor (pT):  pT3a    Regional Lymph Nodes (pN):      Category (pN):  pNX    Distant Metastasis (pM):  pM1     Site(s):  Appendices epiploica  ADDITIONAL FINDINGS   Additional Pathologic Findings:  Uterine leiomyomata    #  Family history ovarian cancer and personal history of endometrial CA.Patient also has had genetic testing done which showed 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  She has discussed findings with Dietitian.  She does not have features of NF.  Not meeting criteria for diagnosis of NF1.  Patient's case was discussed on [facility] oncology tumor board.  Consensus was  to continue with additional 3 cycles of carboplatin and paclitaxel.  Follow CA125 for response.  Repeat imaging after finishing chemotherapy.  Can consider radiation at that time.  INTERVAL  HISTORY Karen Mann is a 58 y.o. female with oncology history reviewed by me today for for acute visit due to worsening of lower extremity/swelling.   Patient has finished adjuvant chemotherapy with carboplatin and paclitaxel.  She is currently getting radiation. She was noted to have worsening of bilateral lower extremity edema left more than right and presented for an acute visit for further evaluation. Reports that edema has gotten worse for the past 2 to 3 days.  Denies any calf tenderness, chest pain, hemoptysis.   Feeling little tired otherwise no new complaints. IVC filter was removed on 02/07/2019.  Tolerates the procedure well. #Reports having lots of diarrhea after started on radiation.  No exacerbating or alleviating factors.  Review of Systems  Constitutional: Negative for chills, fever, malaise/fatigue and weight loss.  HENT: Negative for sore throat.   Eyes: Negative for redness.  Respiratory: Negative for cough, shortness of breath and wheezing.   Cardiovascular: Negative for chest pain, palpitations and leg swelling.  Gastrointestinal: Negative for abdominal pain, blood in stool, nausea and vomiting.  Genitourinary: Negative for dysuria.  Musculoskeletal: Negative for myalgias.  Skin: Negative for rash.  Neurological: Negative for dizziness, tingling and tremors.  Endo/Heme/Allergies: Does not bruise/bleed easily.  Psychiatric/Behavioral: Negative for hallucinations.    MEDICAL HISTORY:  Past Medical History:  Diagnosis Date  . Allergy   . Anemia 07/20/2018  . Endometrial adenocarcinoma ([place]) 07/2018  . Fibroid uterus 07/24/2018   5cm/2 inch   . Ovarian mass 07/24/2018   22cm/10 inch  . Pelvic mass     SURGICAL HISTORY: Past Surgical History:  Procedure Laterality Date  . EYE SURGERY  2000   Lasik  . IVC FILTER REMOVAL Right 02/07/2019   Procedure: IVC FILTER REMOVAL;  Surgeon: Algernon Huxley, MD;  Location: [facility];  Service: Cardiovascular;   Laterality: Right;  . PORTA CATH INSERTION N/A 08/13/2018   Procedure: PORTA CATH INSERTION;  Surgeon: Algernon Huxley, MD;  Location: [facility];  Service: Cardiovascular;  Laterality: N/A;  . Removal of IVC filter   02/07/2019   chemo completed     SOCIAL HISTORY: Social History   Socioeconomic History  . Marital status: Married    Spouse name: Heath Lark   . Number of children: 1  . Years of education: Not on file  . Highest education level: Not on file  Occupational History  . Not on file  Social Needs  . Financial resource strain: Not on file  . Food insecurity:    Worry: Never true    Inability: Never true  . Transportation needs:    Medical: No    Non-medical: No  Tobacco Use  . Smoking status: Never Smoker  . Smokeless tobacco: Never Used  Substance and Sexual Activity  . Alcohol use: No  . Drug use: No  . Sexual activity: Not Currently    Birth control/protection: Post-menopausal  Lifestyle  . Physical activity:    Days per week: 0 days    Minutes per session: Not on file  . Stress: Not on file  Relationships  . Social connections:    Talks on phone: More than three times a week    Gets together: Not on file    Attends religious service: Not on file    Active member  of club or organization: Not on file    Attends meetings of clubs or organizations: Not on file    Relationship status: Married  . Intimate partner violence:    Fear of current or ex partner: No    Emotionally abused: No    Physically abused: No    Forced sexual activity: No  Other Topics Concern  . Not on file  Social History Narrative  . Not on file    FAMILY HISTORY: Family History  Problem Relation Age of Onset  . Hypertension Father   . Ovarian cancer Mother 46       deceased 48  . Healthy Brother   . Skin cancer Paternal Grandmother        deceased 25s  . Cancer Paternal Grandfather        unk. primary; deceased 41s  . Ovarian cancer Other        mat grandmother's  sister    ALLERGIES:  is allergic to paclitaxel and penicillins.  MEDICATIONS:  Current Outpatient Medications  Medication Sig Dispense Refill  . apixaban (ELIQUIS) 5 MG TABS tablet Take 1 tablet (5 mg total) by mouth 2 (two) times daily. 60 tablet 3  . lidocaine-prilocaine (EMLA) cream Apply to affected area once 30 g 3  . ondansetron (ZOFRAN) 8 MG tablet Take 1 tablet (8 mg total) by mouth 2 (two) times daily as needed for refractory nausea / vomiting. Start on day 3 after chemo. 30 tablet 1  . oxyCODONE (ROXICODONE) 5 MG immediate release tablet Take 1 tablet (5 mg total) by mouth every 6 (six) hours as needed for moderate pain, severe pain or breakthrough pain. 10 tablet 0  . Polyethylene Glycol 3350 (MIRALAX PO) Take 17 g by mouth daily as needed.     . potassium chloride SA (K-DUR,KLOR-CON) 20 MEQ tablet Take 1 tablet (20 mEq total) by mouth daily. 30 tablet 0  . prochlorperazine (COMPAZINE) 10 MG tablet Take 1 tablet (10 mg total) by mouth every 6 (six) hours as needed (Nausea or vomiting). 30 tablet 1  . rOPINIRole (REQUIP) 0.25 MG tablet Take 1 tablet (0.25 mg total) by mouth at bedtime. Take 1-3 hours before bedtime 30 tablet 3  . sennosides-docusate sodium (SENOKOT-S) 8.6-50 MG tablet Take 2 tablets by mouth daily.    Marland Kitchen acetaminophen (TYLENOL) 325 MG tablet Take by mouth. Take 650 mg by mouth every 4 (four) hours as needed for Pain    . ALPRAZolam (XANAX) 0.25 MG tablet Take 1 tablet (0.25 mg total) by mouth at bedtime as needed for anxiety or sleep. (Patient not taking: Reported on 02/18/2019) 20 tablet 0  . dexamethasone (DECADRON) 4 MG tablet Take 2 tablets (8 mg total) by mouth See admin instructions. 2 days before and 2 days after chemotherapy (Patient not taking: Reported on 02/18/2019) 30 tablet 1  . furosemide (LASIX) 20 MG tablet Take 1 tablet (20 mg total) by mouth daily as needed for fluid. (Patient not taking: Reported on 02/18/2019) 30 tablet 0  . gabapentin (NEURONTIN) 300  MG capsule Take 3 capsules (900 mg total) by mouth at bedtime. (Patient not taking: Reported on 02/18/2019) 90 capsule 3   No current facility-administered medications for this visit.      PHYSICAL EXAMINATION: ECOG PERFORMANCE STATUS: 1 - Symptomatic but completely ambulatory Vitals:   02/18/19 1053  BP: 115/79  Pulse: 86   Filed Weights   02/18/19 1053  Weight: 213 lb 4 oz (96.7 kg)    Physical Exam  Constitutional:      General: She is not in acute distress. HENT:     Head: Normocephalic and atraumatic.  Eyes:     General: No scleral icterus.    Pupils: Pupils are equal, round, and reactive to light.  Neck:     Musculoskeletal: Normal range of motion and neck supple.  Cardiovascular:     Rate and Rhythm: Normal rate and regular rhythm.     Heart sounds: Normal heart sounds.  Pulmonary:     Effort: Pulmonary effort is normal. No respiratory distress.     Breath sounds: Normal breath sounds. No wheezing.  Abdominal:     General: Bowel sounds are normal. There is no distension.     Palpations: Abdomen is soft. There is no mass.     Tenderness: There is no abdominal tenderness.  Musculoskeletal: Normal range of motion.        General: No swelling or deformity.  Skin:    General: Skin is warm and dry.     Findings: No erythema or rash.  Neurological:     Mental Status: She is alert and oriented to person, place, and time.     Cranial Nerves: No cranial nerve deficit.     Coordination: Coordination normal.  Psychiatric:        Behavior: Behavior normal.        Thought Content: Thought content normal.        LABORATORY DATA:  I have reviewed the data as listed Lab Results  Component Value Date   WBC 3.3 (L) 02/18/2019   HGB 11.3 (L) 02/18/2019   HCT 33.3 (L) 02/18/2019   MCV 90.2 02/18/2019   PLT 190 02/18/2019   Recent Labs    12/12/18 0816 01/02/19 0805 02/18/19 1054  NA 139 140 140  K 3.6 3.5 3.8  CL 106 106 107  CO2 '25 26 27  '$ GLUCOSE 85 88 93    BUN '16 19 12  '$ CREATININE 0.77 0.77 0.65  CALCIUM 9.4 9.4 8.8*  GFRNONAA >60 >60 >60  GFRAA >60 >60 >60  PROT 7.1 7.1 6.3*  ALBUMIN 4.3 4.5 3.7  AST '18 17 15  '$ ALT '19 20 13  '$ ALKPHOS 56 58 45  BILITOT 0.6 1.0 0.6   Iron/TIBC/Ferritin/ %Sat    Component Value Date/Time   IRON 25 (L) 09/05/2018 0819   IRON 11 (L) 07/17/2018 1124   TIBC 245 (L) 09/05/2018 0819   TIBC 312 07/17/2018 1124   FERRITIN 323 (H) 09/05/2018 0819   FERRITIN 60 07/17/2018 1124   IRONPCTSAT 10 (L) 09/05/2018 0819   IRONPCTSAT 4 (LL) 07/17/2018 1124    RADIOGRAPHIC STUDIES: I have personally reviewed the radiological images as listed and agreed with the findings in the report. 08/24/1999 nineteen 2D echo showed LVEF 55 to 60%. 08/30/2018 CXR Interval reduction/resolution of right-sided pleural effusion post thoracentesis. No pneumothorax.  09/23/2018, [place] lower extremity venous bilaterally positive for bilateral DVT CT chest PE protocol showed bilateral pulmonary emboli, most centrally in the bilateral main pulmonary arteries.  There are associated with signs suggestive of right heart strain.  No evidence of pulmonary infarct.  Moderate right pleural effusion.  Partially visualized left hydronephrosis also present on prior CT.   ASSESSMENT & PLAN:  1. Leg swelling   2. Endometrial adenocarcinoma ([place])   3. Bilateral pulmonary embolism ([place])   4. Chronic anticoagulation     # FIGO grade 3 endometrial adenocarcinoma, pT3a Nx pM1 11/21/2018, post surgery CA125 58.3 Patient is currently  on adjuvant radiation.  Tolerates well.  #Bilateral lower extremity edema left worse than right.  History of bilateral PE and bilateral lower extremity DVT  patient is on chronic anticoagulation with Eliquis 5 mg twice daily.  Recently IVC filter has been removed.  I suspect that she probably developed chronic vein insufficiency.  Given that she is a high risk patient, plan to obtain bilateral lower extremity venous Doppler for  further evaluation. #Grade 1 therapy-induced neuropathy, continue gabapentin 900 MCG QHS #Diarrhea, likely radiation-induced.  Will obtain CBC, CMP.  Discussed with patient to maintain well hydration. # Port A Cath in place, port flush Q 6weeks.  The patient knows to call the clinic with any problems questions or concerns.  Orders Placed This Encounter  Procedures  . US Venous Img Lower Bilateral    Standing Status:   Future    Number of Occurrences:   1    Standing Expiration Date:   02/19/2020    Order Specific Question:   Reason for Exam (SYMPTOM  OR DIAGNOSIS REQUIRED)    Answer:   bilateral lower extremities swelling, history of DVT    Order Specific Question:   Preferred imaging location?    Answer:   [facility]  . CBC with Differential/Platelet    Standing Status:   Future    Number of Occurrences:   1    Standing Expiration Date:   02/18/2020  . Comprehensive metabolic panel    Standing Status:   Future    Number of Occurrences:   1    Standing Expiration Date:   02/18/2020  . Magnesium    Standing Status:   Future    Number of Occurrences:   1    Standing Expiration Date:   02/18/2020    Return of visit: To be determined.  She has appointment with GYN oncology after repeating CT scan after she finished radiation.  Will decide at that point based on her CT results. Earlie Server, MD, PhD Hematology Oncology [facility] Pager- [phone] 02/18/2019

## 2019-02-19 ENCOUNTER — Ambulatory Visit
Admission: RE | Admit: 2019-02-19 | Discharge: 2019-02-19 | Disposition: A | Discharge status: Home / Self Care | Payer: 59 | Source: Ambulatory Visit | Attending: Radiation Oncology | Admitting: Radiation Oncology

## 2019-02-19 DIAGNOSIS — C541 Malignant neoplasm of endometrium: Secondary | ICD-10-CM | POA: Diagnosis not present

## 2019-02-19 LAB — CA 125: Cancer Antigen (CA) 125: 3.9 U/mL (ref 0.0–38.1)

## 2019-02-20 ENCOUNTER — Ambulatory Visit
Admission: RE | Admit: 2019-02-20 | Discharge: 2019-02-20 | Disposition: A | Discharge status: Home / Self Care | Payer: 59 | Source: Ambulatory Visit | Attending: Radiation Oncology | Admitting: Radiation Oncology

## 2019-02-20 DIAGNOSIS — C541 Malignant neoplasm of endometrium: Secondary | ICD-10-CM | POA: Diagnosis not present

## 2019-02-21 ENCOUNTER — Ambulatory Visit
Admission: RE | Admit: 2019-02-21 | Discharge: 2019-02-21 | Disposition: A | Discharge status: Home / Self Care | Payer: 59 | Source: Ambulatory Visit | Attending: Radiation Oncology | Admitting: Radiation Oncology

## 2019-02-21 DIAGNOSIS — C541 Malignant neoplasm of endometrium: Secondary | ICD-10-CM | POA: Diagnosis not present

## 2019-02-22 ENCOUNTER — Ambulatory Visit
Admission: RE | Admit: 2019-02-22 | Discharge: 2019-02-22 | Disposition: A | Discharge status: Home / Self Care | Payer: 59 | Source: Ambulatory Visit | Attending: Radiation Oncology | Admitting: Radiation Oncology

## 2019-02-22 ENCOUNTER — Other Ambulatory Visit: Payer: Self-pay | Admitting: Oncology

## 2019-02-22 DIAGNOSIS — C541 Malignant neoplasm of endometrium: Secondary | ICD-10-CM | POA: Diagnosis not present

## 2019-02-22 NOTE — Telephone Encounter (Signed)
   Ref Range & Units 4d ago (02/18/19) 2mo ago (01/02/19) 53mo ago (12/12/18) 26mo ago (11/21/18) 55mo ago (10/04/18) 50mo ago (09/26/18) 28mo ago (09/11/18)  Potassium 3.5 - 5.1 mmol/L 3.8  3.5  3.6  3.4Low

## 2019-02-25 ENCOUNTER — Ambulatory Visit
Admission: RE | Admit: 2019-02-25 | Discharge: 2019-02-25 | Disposition: A | Discharge status: Home / Self Care | Payer: 59 | Source: Ambulatory Visit | Attending: Radiation Oncology | Admitting: Radiation Oncology

## 2019-02-25 ENCOUNTER — Other Ambulatory Visit: Payer: Self-pay | Admitting: Oncology

## 2019-02-25 DIAGNOSIS — C541 Malignant neoplasm of endometrium: Secondary | ICD-10-CM | POA: Diagnosis not present

## 2019-02-25 DIAGNOSIS — Z510 Encounter for antineoplastic radiation therapy: Secondary | ICD-10-CM | POA: Diagnosis not present

## 2019-02-26 ENCOUNTER — Ambulatory Visit
Admission: RE | Admit: 2019-02-26 | Discharge: 2019-02-26 | Disposition: A | Discharge status: Home / Self Care | Payer: 59 | Source: Ambulatory Visit | Attending: Radiation Oncology | Admitting: Radiation Oncology

## 2019-02-26 ENCOUNTER — Ambulatory Visit: Payer: 59

## 2019-02-26 DIAGNOSIS — C541 Malignant neoplasm of endometrium: Secondary | ICD-10-CM | POA: Diagnosis not present

## 2019-02-26 DIAGNOSIS — Z510 Encounter for antineoplastic radiation therapy: Secondary | ICD-10-CM | POA: Diagnosis not present

## 2019-02-26 NOTE — Telephone Encounter (Signed)
swelling   Ref Range & Units 8d ago (02/18/19) 12mo ago (01/02/19) 23mo ago (12/12/18) 65mo ago (11/21/18) 43mo ago (10/04/18) 47mo ago (09/26/18) 56mo ago (09/11/18)  Potassium 3.5 - 5.1 mmol/L 3.8  3.5  3.6

## 2019-02-27 ENCOUNTER — Ambulatory Visit
Admission: RE | Admit: 2019-02-27 | Discharge: 2019-02-27 | Disposition: A | Discharge status: Home / Self Care | Payer: 59 | Source: Ambulatory Visit | Attending: Radiation Oncology | Admitting: Radiation Oncology

## 2019-02-27 DIAGNOSIS — C541 Malignant neoplasm of endometrium: Secondary | ICD-10-CM | POA: Diagnosis not present

## 2019-02-27 DIAGNOSIS — Z510 Encounter for antineoplastic radiation therapy: Secondary | ICD-10-CM | POA: Diagnosis not present

## 2019-02-27 MED ORDER — POTASSIUM CHLORIDE CRYS ER 20 MEQ PO TBCR: 20.0000 meq | EXTENDED_RELEASE_TABLET | Freq: Every day | ORAL | 0 refills | Status: DC

## 2019-02-28 DIAGNOSIS — Z510 Encounter for antineoplastic radiation therapy: Secondary | ICD-10-CM | POA: Diagnosis not present

## 2019-02-28 DIAGNOSIS — C541 Malignant neoplasm of endometrium: Secondary | ICD-10-CM | POA: Diagnosis not present

## 2019-03-01 ENCOUNTER — Other Ambulatory Visit: Payer: Self-pay | Admitting: *Deleted

## 2019-03-01 MED ORDER — APIXABAN 5 MG PO TABS: 5.0000 mg | ORAL_TABLET | Freq: Two times a day (BID) | ORAL | 0 refills | Status: DC

## 2019-03-01 NOTE — Telephone Encounter (Signed)
Patient called and insurance is requiring her to get a 90 day supply of Eliquis from [facility].

## 2019-03-04 ENCOUNTER — Other Ambulatory Visit: Payer: Self-pay | Admitting: *Deleted

## 2019-03-04 MED ORDER — APIXABAN 5 MG PO TABS: 5.0000 mg | ORAL_TABLET | Freq: Two times a day (BID) | ORAL | 0 refills | Status: DC

## 2019-03-05 ENCOUNTER — Ambulatory Visit: Payer: 59

## 2019-03-05 ENCOUNTER — Ambulatory Visit
Admission: RE | Admit: 2019-03-05 | Discharge: 2019-03-05 | Disposition: A | Discharge status: Home / Self Care | Payer: 59 | Source: Ambulatory Visit | Attending: Radiation Oncology | Admitting: Radiation Oncology

## 2019-03-05 ENCOUNTER — Encounter: Payer: Self-pay | Admitting: Nurse Practitioner

## 2019-03-05 DIAGNOSIS — C541 Malignant neoplasm of endometrium: Secondary | ICD-10-CM | POA: Diagnosis not present

## 2019-03-05 DIAGNOSIS — Z510 Encounter for antineoplastic radiation therapy: Secondary | ICD-10-CM | POA: Diagnosis not present

## 2019-03-05 NOTE — Progress Notes (Signed)
Peer-to-peer performed and approved for CT abdomen-pelvis.  Scan was previously scheduled for 03/06/2019.  It appears that patient is still receiving HDR with Dr. Baruch Gouty and is scheduled to complete on 03/11/2019.  Therefore recommend postponing scan until she has completed brachytherapy.  She is scheduled for follow-up with GYN oncology on 03/20/2019.  Discussed with Dr. Tasia Catchings who agrees. Scan rescheduled for 03/18/2019 at 8:30 AM at outpatient imaging center.  I will notify patient.

## 2019-03-06 ENCOUNTER — Ambulatory Visit: Admission: RE | Admit: 2019-03-06 | Payer: 59 | Source: Ambulatory Visit

## 2019-03-06 ENCOUNTER — Ambulatory Visit: Payer: 59

## 2019-03-07 ENCOUNTER — Ambulatory Visit
Admission: RE | Admit: 2019-03-07 | Discharge: 2019-03-07 | Disposition: A | Discharge status: Home / Self Care | Payer: 59 | Source: Ambulatory Visit | Attending: Radiation Oncology | Admitting: Radiation Oncology

## 2019-03-07 ENCOUNTER — Other Ambulatory Visit: Payer: Self-pay

## 2019-03-07 DIAGNOSIS — C541 Malignant neoplasm of endometrium: Secondary | ICD-10-CM | POA: Diagnosis not present

## 2019-03-07 DIAGNOSIS — Z510 Encounter for antineoplastic radiation therapy: Secondary | ICD-10-CM | POA: Diagnosis not present

## 2019-03-11 ENCOUNTER — Ambulatory Visit: Payer: 59

## 2019-03-12 ENCOUNTER — Ambulatory Visit
Admission: RE | Admit: 2019-03-12 | Discharge: 2019-03-12 | Disposition: A | Discharge status: Home / Self Care | Payer: 59 | Source: Ambulatory Visit | Attending: Radiation Oncology | Admitting: Radiation Oncology

## 2019-03-12 ENCOUNTER — Ambulatory Visit: Payer: 59

## 2019-03-12 ENCOUNTER — Other Ambulatory Visit: Payer: Self-pay

## 2019-03-12 DIAGNOSIS — C541 Malignant neoplasm of endometrium: Secondary | ICD-10-CM | POA: Diagnosis not present

## 2019-03-12 DIAGNOSIS — Z510 Encounter for antineoplastic radiation therapy: Secondary | ICD-10-CM | POA: Diagnosis not present

## 2019-03-18 ENCOUNTER — Ambulatory Visit: Admission: RE | Admit: 2019-03-18 | Payer: 59 | Source: Ambulatory Visit

## 2019-03-19 ENCOUNTER — Other Ambulatory Visit: Payer: Self-pay

## 2019-03-20 ENCOUNTER — Inpatient Hospital Stay: Payer: 59 | Admitting: Oncology

## 2019-03-20 ENCOUNTER — Other Ambulatory Visit: Payer: Self-pay

## 2019-03-20 ENCOUNTER — Inpatient Hospital Stay: Payer: 59

## 2019-03-20 ENCOUNTER — Inpatient Hospital Stay: Payer: 59 | Attending: Oncology

## 2019-03-20 DIAGNOSIS — Z452 Encounter for adjustment and management of vascular access device: Secondary | ICD-10-CM | POA: Diagnosis not present

## 2019-03-20 DIAGNOSIS — Z95828 Presence of other vascular implants and grafts: Secondary | ICD-10-CM

## 2019-03-20 DIAGNOSIS — C541 Malignant neoplasm of endometrium: Secondary | ICD-10-CM | POA: Diagnosis not present

## 2019-03-20 MED ORDER — HEPARIN SOD (PORK) LOCK FLUSH 100 UNIT/ML IV SOLN
500.0000 [IU] | Freq: Once | INTRAVENOUS | Status: AC
  Administered 2019-03-20: 500 [IU] via INTRAVENOUS

## 2019-03-20 MED ORDER — SODIUM CHLORIDE 0.9% FLUSH
10.0000 mL | Freq: Once | INTRAVENOUS | Status: AC
  Administered 2019-03-20: 10 mL via INTRAVENOUS
  Filled 2019-03-20: qty 10

## 2019-04-15 ENCOUNTER — Ambulatory Visit: Payer: 59 | Admitting: Radiation Oncology

## 2019-04-18 ENCOUNTER — Encounter: Payer: Self-pay | Admitting: Oncology

## 2019-04-18 ENCOUNTER — Telehealth: Payer: Self-pay

## 2019-04-18 NOTE — Telephone Encounter (Signed)
LVM-PHQ-2 screening

## 2019-04-19 ENCOUNTER — Other Ambulatory Visit: Payer: Self-pay

## 2019-04-19 ENCOUNTER — Ambulatory Visit
Admission: RE | Admit: 2019-04-19 | Discharge: 2019-04-19 | Disposition: A | Discharge status: Home / Self Care | Payer: 59 | Source: Ambulatory Visit | Attending: Nurse Practitioner | Admitting: Nurse Practitioner

## 2019-04-19 DIAGNOSIS — C541 Malignant neoplasm of endometrium: Secondary | ICD-10-CM

## 2019-04-19 DIAGNOSIS — K8020 Calculus of gallbladder without cholecystitis without obstruction: Secondary | ICD-10-CM | POA: Diagnosis not present

## 2019-04-19 LAB — POCT I-STAT CREATININE: Creatinine, Ser: 0.8 mg/dL (ref 0.44–1.00)

## 2019-04-19 MED ORDER — IOHEXOL 300 MG/ML  SOLN
100.0000 mL | Freq: Once | INTRAMUSCULAR | Status: AC | PRN
  Administered 2019-04-19: 100 mL via INTRAVENOUS

## 2019-04-23 ENCOUNTER — Other Ambulatory Visit: Payer: Self-pay

## 2019-04-24 ENCOUNTER — Inpatient Hospital Stay: Payer: 59 | Attending: Oncology | Admitting: Oncology

## 2019-04-24 ENCOUNTER — Ambulatory Visit: Payer: 59 | Admitting: Radiation Oncology

## 2019-04-24 ENCOUNTER — Encounter: Payer: Self-pay | Admitting: Oncology

## 2019-04-24 ENCOUNTER — Other Ambulatory Visit: Payer: Self-pay | Admitting: Oncology

## 2019-04-24 ENCOUNTER — Encounter: Payer: Self-pay | Admitting: Obstetrics and Gynecology

## 2019-04-24 ENCOUNTER — Inpatient Hospital Stay (HOSPITAL_BASED_OUTPATIENT_CLINIC_OR_DEPARTMENT_OTHER): Payer: 59 | Admitting: Obstetrics and Gynecology

## 2019-04-24 ENCOUNTER — Inpatient Hospital Stay: Payer: 59

## 2019-04-24 VITALS — HR 90

## 2019-04-24 VITALS — BP 102/70 | HR 90

## 2019-04-24 DIAGNOSIS — Z90710 Acquired absence of both cervix and uterus: Secondary | ICD-10-CM | POA: Diagnosis not present

## 2019-04-24 DIAGNOSIS — C541 Malignant neoplasm of endometrium: Secondary | ICD-10-CM | POA: Diagnosis not present

## 2019-04-24 DIAGNOSIS — Z7901 Long term (current) use of anticoagulants: Secondary | ICD-10-CM | POA: Diagnosis not present

## 2019-04-24 DIAGNOSIS — I87009 Postthrombotic syndrome without complications of unspecified extremity: Secondary | ICD-10-CM | POA: Diagnosis not present

## 2019-04-24 DIAGNOSIS — Z9221 Personal history of antineoplastic chemotherapy: Secondary | ICD-10-CM | POA: Diagnosis not present

## 2019-04-24 DIAGNOSIS — Z90722 Acquired absence of ovaries, bilateral: Secondary | ICD-10-CM

## 2019-04-24 DIAGNOSIS — I2699 Other pulmonary embolism without acute cor pulmonale: Secondary | ICD-10-CM | POA: Diagnosis not present

## 2019-04-24 DIAGNOSIS — Z86718 Personal history of other venous thrombosis and embolism: Secondary | ICD-10-CM

## 2019-04-24 DIAGNOSIS — Z923 Personal history of irradiation: Secondary | ICD-10-CM

## 2019-04-24 DIAGNOSIS — Z79891 Long term (current) use of opiate analgesic: Secondary | ICD-10-CM

## 2019-04-24 DIAGNOSIS — Z79899 Other long term (current) drug therapy: Secondary | ICD-10-CM

## 2019-04-24 MED ORDER — APIXABAN 2.5 MG PO TABS: 2.5000 mg | ORAL_TABLET | Freq: Two times a day (BID) | ORAL | 3 refills | Status: DC

## 2019-04-24 NOTE — Telephone Encounter (Signed)
   Ref Range & Units 16mo ago (02/18/19) 69mo ago (01/02/19) 48mo ago (12/12/18) 25mo ago (11/21/18) 23mo ago (10/04/18) 38mo ago (09/26/18) 63mo ago (09/11/18)  Potassium 3.5 - 5.1 mmol/L 3.8  3.5  3.6  3.4Low   3.9  3.7  4.0

## 2019-04-24 NOTE — Progress Notes (Signed)
HEMATOLOGY-ONCOLOGY TeleHEALTH VISIT PROGRESS NOTE  I connected with Karen Mann on 04/24/19 at  9:00 AM EDT by video enabled telemedicine visit and verified that I am speaking with the correct person using two identifiers. I discussed the limitations, risks, security and privacy concerns of performing an evaluation and management service by telemedicine and the availability of in-person appointments. I also discussed with the patient that there may be a patient responsible charge related to this service. The patient expressed understanding and agreed to proceed.   Other persons participating in the visit and their role in the encounter:  Geraldine Solar, [place], check in patient   Janeann Merl, RN, check in patient.   Patient's location: Home  Provider's location: home  Chief Complaint: Follow up for history of endometrial adenocarcinoma, chronic anticoagulation for history of PE and  DVT.   INTERVAL HISTORY Karen Mann is a 58 y.o. female who has above history reviewed by me today presents for follow up visit for management of history of endometrial adenocarcinoma, chronic anticoagulation for history of PE and a DVT. Problems and complaints are listed below:  Patient reports doing well.  Denies any fatigue, shortness of breath, chest pain, abdominal pain. She has had CT abdomen pelvis done during interval.  Patient also takes Eliquis 5 mg twice daily for history of acute PE and DVT.  Tolerates well. Denies hematochezia, hematuria, hematemesis, epistaxis, black tarry stool or easy bruising.  IVC filter was removed on 02/07/2019.  Chronic bilateral lower extremity swelling has improved. Review of Systems  Constitutional: Negative for appetite change, chills, fatigue and fever.  HENT:   Negative for hearing loss and voice change.   Eyes: Negative for eye problems.  Respiratory: Negative for chest tightness and cough.   Cardiovascular: Negative for chest pain.  Gastrointestinal:  Negative for abdominal distention, abdominal pain and blood in stool.  Endocrine: Negative for hot flashes.  Genitourinary: Negative for difficulty urinating and frequency.   Musculoskeletal: Negative for arthralgias.  Skin: Negative for itching and rash.  Neurological: Negative for extremity weakness.  Hematological: Negative for adenopathy.  Psychiatric/Behavioral: Negative for confusion.    Past Medical History:  Diagnosis Date  . Allergy   . Anemia 07/20/2018  . Endometrial adenocarcinoma ([place]) 07/2018  . Fibroid uterus 07/24/2018   5cm/2 inch   . Ovarian mass 07/24/2018   22cm/10 inch  . Pelvic mass    Past Surgical History:  Procedure Laterality Date  . EYE SURGERY  2000   Lasik  . IVC FILTER REMOVAL Right 02/07/2019   Procedure: IVC FILTER REMOVAL;  Surgeon: Algernon Huxley, MD;  Location: [facility];  Service: Cardiovascular;  Laterality: Right;  . PORTA CATH INSERTION N/A 08/13/2018   Procedure: PORTA CATH INSERTION;  Surgeon: Algernon Huxley, MD;  Location: [facility];  Service: Cardiovascular;  Laterality: N/A;  . Removal of IVC filter   02/07/2019   chemo completed     Family History  Problem Relation Age of Onset  . Hypertension Father   . Ovarian cancer Mother 56       deceased 35  . Healthy Brother   . Skin cancer Paternal Grandmother        deceased 60s  . Cancer Paternal Grandfather        unk. primary; deceased 33s  . Ovarian cancer Other        mat grandmother's sister    Social History   Socioeconomic History  . Marital status: Married  Spouse name: Heath Lark   . Number of children: 1  . Years of education: Not on file  . Highest education level: Not on file  Occupational History  . Not on file  Social Needs  . Financial resource strain: Not on file  . Food insecurity:    Worry: Never true    Inability: Never true  . Transportation needs:    Medical: No    Non-medical: No  Tobacco Use  . Smoking status: Never Smoker  .  Smokeless tobacco: Never Used  Substance and Sexual Activity  . Alcohol use: No  . Drug use: No  . Sexual activity: Not Currently    Birth control/protection: Post-menopausal  Lifestyle  . Physical activity:    Days per week: 0 days    Minutes per session: Not on file  . Stress: Not on file  Relationships  . Social connections:    Talks on phone: More than three times a week    Gets together: Not on file    Attends religious service: Not on file    Active member of club or organization: Not on file    Attends meetings of clubs or organizations: Not on file    Relationship status: Married  . Intimate partner violence:    Fear of current or ex partner: No    Emotionally abused: No    Physically abused: No    Forced sexual activity: No  Other Topics Concern  . Not on file  Social History Narrative  . Not on file    Current Outpatient Medications on File Prior to Visit  Medication Sig Dispense Refill  . apixaban (ELIQUIS) 5 MG TABS tablet Take 1 tablet (5 mg total) by mouth 2 (two) times daily. 180 tablet 0  . lidocaine-prilocaine (EMLA) cream Apply to affected area once (Patient not taking: Reported on 04/24/2019) 30 g 3  . ondansetron (ZOFRAN) 8 MG tablet Take 1 tablet (8 mg total) by mouth 2 (two) times daily as needed for refractory nausea / vomiting. Start on day 3 after chemo. (Patient not taking: Reported on 04/24/2019) 30 tablet 1  . oxyCODONE (ROXICODONE) 5 MG immediate release tablet Take 1 tablet (5 mg total) by mouth every 6 (six) hours as needed for moderate pain, severe pain or breakthrough pain. (Patient not taking: Reported on 04/24/2019) 10 tablet 0  . Polyethylene Glycol 3350 (MIRALAX PO) Take 17 g by mouth daily as needed.     . prochlorperazine (COMPAZINE) 10 MG tablet Take 1 tablet (10 mg total) by mouth every 6 (six) hours as needed (Nausea or vomiting). (Patient not taking: Reported on 04/24/2019) 30 tablet 1   No current facility-administered medications on file  prior to visit.     Allergies  Allergen Reactions  . Paclitaxel Shortness Of Breath    Chest tightening with second round chemo  . Penicillins        Observations/Objective: Today's Vitals   04/24/19 0833  Pulse: 90  SpO2: 95%  PainSc: 0-No pain   There is no height or weight on file to calculate BMI.  Physical Exam  Constitutional: She is oriented to person, place, and time and well-developed, well-nourished, and in no distress. No distress.  HENT:  Head: Normocephalic and atraumatic.  Pulmonary/Chest: Effort normal.  Neurological: She is alert and oriented to person, place, and time.  Psychiatric: Affect normal.    CBC    Component Value Date/Time   WBC 3.3 (L) 02/18/2019 1054   RBC 3.69 (L)  02/18/2019 1054   HGB 11.3 (L) 02/18/2019 1054   HGB 8.7 (L) 07/17/2018 1124   HCT 33.3 (L) 02/18/2019 1054   HCT 29.2 (L) 07/17/2018 1124   PLT 190 02/18/2019 1054   PLT 575 (H) 07/17/2018 1124   MCV 90.2 02/18/2019 1054   MCV 74 (L) 07/17/2018 1124   MCH 30.6 02/18/2019 1054   MCHC 33.9 02/18/2019 1054   RDW 17.1 (H) 02/18/2019 1054   RDW 16.2 (H) 07/17/2018 1124   LYMPHSABS 0.4 (L) 02/18/2019 1054   LYMPHSABS 2.3 11/27/2015 1008   MONOABS 0.3 02/18/2019 1054   EOSABS 0.3 02/18/2019 1054   EOSABS 0.5 (H) 11/27/2015 1008   BASOSABS 0.0 02/18/2019 1054   BASOSABS 0.1 11/27/2015 1008    CMP     Component Value Date/Time   NA 140 02/18/2019 1054   NA 137 07/17/2018 1124   K 3.8 02/18/2019 1054   CL 107 02/18/2019 1054   CO2 27 02/18/2019 1054   GLUCOSE 93 02/18/2019 1054   BUN 12 02/18/2019 1054   BUN 7 07/17/2018 1124   CREATININE 0.80 04/19/2019 0839   CALCIUM 8.8 (L) 02/18/2019 1054   PROT 6.3 (L) 02/18/2019 1054   PROT 6.5 07/17/2018 1124   ALBUMIN 3.7 02/18/2019 1054   ALBUMIN 3.8 07/17/2018 1124   AST 15 02/18/2019 1054   ALT 13 02/18/2019 1054   ALKPHOS 45 02/18/2019 1054   BILITOT 0.6 02/18/2019 1054   BILITOT 0.4 07/17/2018 1124   GFRNONAA >60  02/18/2019 1054   GFRAA >60 02/18/2019 1054   RADIOGRAPHIC STUDIES: I have personally reviewed the radiological images as listed and agreed with the findings in the report. 04/19/2019 CT abdomen pelvis w contrast.  1. Stable soft tissue density involving the left vaginal cuff. This may be postop in nature, although residual tumor at this site cannot definitely be excluded. Recommend continued follow-up by CT in 3 months. 2. No new or progressive disease identified within the abdomen or pelvis. 3. Cholelithiasis. No radiographic evidence of cholecystitis  Assessment and Plan: 1. Endometrial adenocarcinoma ([place])   2. Bilateral pulmonary embolism ([place])   3. Chronic anticoagulation   4. Post-thrombotic syndrome   5. History of deep vein thrombosis (DVT) of lower extremity     Endometrial adenocarcinoma, CT was independently reviewed and discussed with patient.  No evidence of disease recurrence.  Stable soft tissue nodule which was not palpable on previous pelvic exam by GYN oncology.  She will need to see GYN oncology for pelvic exam after COVID 19 pandemic resolved.  Patient has virtual visit with Dr. Fransisca Connors later today. She was not scheduled to get lab today.  Will obtain CBC, CMP, CA125 last CA125 02/18/2019 3.9.  Discussed with Marita Kansas RN navigator for Dr. Fransisca Connors, who will be in charge of surveillance images and management of patient's endometrial cancer from this point.  I will join the management if there is any need of systemic chemotherapy.   Chronic anticoagulation for history of bilateral pulmonary embolism, history of lower extremity DVT. Currently on Eliquis 5 mg twice daily.  IVC filter was retrieved in February 2020.  Thrombosis are likely provoked event secondary to malignancy.  She does have some chronic lower extremity swelling likely due to post thrombotic syndrome/insufficiency. Patient has finished more than 6 months of full dose anticoagulation.  She reports to having about  1-1.5 month supply of 5 mg tablets.  She may finish her current supply and I recommend to decrease to Eliquis 2.5 mg twice  daily for maintenance.  Eventually she may be taken off anticoagulation if remains cancer free.  #Port-A-Cath, plan is to keep for 2 years after surgery.  Continue port flush every 6 to 8 weeks, will schedule for 6 flushes.  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  Currently she is not taking any gabapentin.  Continue to monitor.  I will continue to participate in her care for chronic anticoagulation, chemotherapy long term side effects. Discussed with patient.   Follow Up Instructions: 3 months, repeat CBC, CMP   I discussed the assessment and treatment plan with the patient. The patient was provided an opportunity to ask questions and all were answered. The patient agreed with the plan and demonstrated an understanding of the instructions.  The patient was advised to call back or seek an in-person evaluation if the symptoms worsen or if the condition fails to improve as anticipated.   I provided 25 minutes of face-to-face video visit time during this encounter, and > 50% was spent counseling as documented under my assessment & plan.  Earlie Server, MD 04/24/2019 4:10 PM

## 2019-04-24 NOTE — Progress Notes (Signed)
Follow up arranged for 6-8 weeks.

## 2019-04-24 NOTE — Progress Notes (Signed)
Patient contacted via phone for Virtual visit. Pt requesting potassium refill.

## 2019-04-24 NOTE — Progress Notes (Signed)
Gynecologic Oncology Progress Note  Virtual Visit via Telephone Note  Referring Provider:  Dr. Gilman Mann  Chief Complaint: Endometrioid adenocarcinoma  I connected with Karen Mann on 04/24/2019 at 9:45 AM EST by video enabled telemedicine visit and verified that I am speaking with the correct person using two identifiers.   I discussed the limitations, risks, security and privacy concerns of performing an evaluation and management service by telemedicine and the availability of in-person appointments. I also discussed with the patient that there may be a patient responsible charge related to this service. The patient expressed understanding and agreed to proceed.   Other persons participating in the visit and their role in the encounter: Karen Mann (patient), Karen Mann (RN/Nurse Navigator), Karen Mann (NP), and Karen Mann (patient's spouse)  Patient's location: home  Provider's location: home  Chief Complaint: endometrioid adenocarcinoma  Patient agreed to evaluation by telephone/telemedicine to discuss follow up for stage IIIA endometrioid adenocarcinoma.    Subjective:  Karen Mann is a 58 y.o. female seen in consultation for Dr. Nechama Mann, diagnosed with stage IIIA grade 3 endometrioid adenocarcinoma.   Returns for follow up today, she reports overall feeling well.  She initiated pelvic radiation with Dr. Baruch Mann on 01/22/2019 because fragments of ovarian mass that were peeled off the pelvic peritoneum after main mass removed had cancer. Finished radiation in 2/20 including pelvic ERT and vaginal bracytherapy.  CT abd/pelvis IMPRESSION: 1. Stable soft tissue density involving the left vaginal cuff. This may be postop in nature, although residual tumor at this site cannot definitely be excluded. Recommend continued follow-up by CT in 3 months. 2. No new or progressive disease identified within the abdomen or pelvis. 3. Cholelithiasis. No radiographic evidence of  cholecystitis.  No complaints today.  No fever, chills, cough, dyspnea.  No GI or Gu symptoms or leg swelling.  Last CA125 in 2/20 was <5  IVC filter removed 2/13 and she is taking Karen Mann.  Oncology History She was seen in GYN oncology clinic and reported continual vaginal bleeding for past 5-6 months fluctuating from light to heavy (saturating tampon every hour).  She was found to be significantly anemic and started on iron pills.  She was also having significant dyspnea.   Patient initially referred to Dr. Gilman Mann by PCP, Dr. Brita Mann, d/t large abdominal mass seen on pelvic ultrasound concerning for ovarian cancer. Patient first noticed bloating, fatigue, and constipation in July 2019. She presented to PCP. FSH 6.4, LH 5.8.   07/23/18- Ultrasound was performed which showed: pelvic mass measuring 20 x 13 x 22 cm with cystic and solid components. Ultrasound also showed: 4-5cm fibroid and thickened endometrium of 75m  08/05/18- MR PELVIS W WO CONTRAST - 20.6 cm (17.1 x 20.6 x 20.0 cm) complex cystic/solid mass in the pelvis w/ numerous thickened/enhancing septations, compatible with malignant ovarian neoplasm. Neither ovary is discretely visualized. - Small volume abdominopelvic ascites. Gross peritoneal disease is not visualized but cannot be excluded. - Left hydroureteronephrosis secondary to extrinsic compression. - 5.3 intramural anterior uterine body fibroid  On exam, Karen Mann bilateral lower extremity swelling with mass extending close to sternum, ~ 30 cm. Endometrial biopsy was performed. Pathology: Endometrioid Adenocarcinoma consistent with FIGO grade 3  CA 125  08/06/2018 622.0  08/09/18- CT C/A/P-imaging showed a large cystic/solid mass measuring 28 x 11.6 x 21.4 cm suspicious for ovarian malignancy along with moderate ascites, large right pleural effusion, and moderate left and mild right hydronephrosis attributed to ureteral compression due to mass.  08/15/2018-initiated  carboplatin AUC 6 and paclitaxel 175 mg/m.  Ca 125- 692  09/05/2018-cycle 2 carboplatin AUC 6 and paclitaxel 175 mg/m.  [id number].  Mild hypersensitivity to paclitaxel with chest tightness.  Taxol. Solu-Medrol and Benadryl given along with fluid bolus.  Challenged and patient was able to tolerate treatment at slower rate.  09/21/2018-planned for hysterectomy, however, patient diagnosed with PE/DVT.  CT chest PE protocol- 1.  Bilateral pulmonary emboli, most centrally in the bilateral main pulmonary arteries.  No evidence of right heart strain or pulmonary infarct. 2.  Moderate right pleural effusion 3.  Partially visualized left hydronephrosis  [place] bilateral lower extremities revealed 1.  Deep vein thrombus in the right popliteal vein, thrombus extends into the right tibioperoneal trunk and proximal and mid portions of the right peroneal veins. 2.  Deep vein thrombus in the left common femoral vein and in the left popliteal vein. 3.  Occlusive thrombus in seen on in the left greater saphenous vein, superficial vein.  Echo revealed LVEF 55%.   09/26/2018-underwent placement of IVC filter in IR.  She was started on therapeutic Lovenox.   She proceeded with cycle 3 carboplatin AUC 6 and paclitaxel 175 mg/m with Neulasta support on 09/26/2018.   Ca 125- 521  10/19/2018-CT C/A/P 1. Redemonstrated large cystic and solid mass measuring similarly in size. 2. Improved left hydronephrosis. 3.  Improved ascites with redemonstration of a moderate right pleural effusion.  Redemonstrated large cystic solid pelvic mass, dorsal to the uterus and inseparable from the ovaries, measuring approximately 22.8 x 21.9 x 12.3 cm previously measuring 21.6 x 21.2 x 14.8 cm.  Slightly improved ascites.  She underwent palliative thoracentesis on 10/26/2018, 10/11/18, 10/02/18, 09/19/18, 09/10/18, 08/30/18, 08/22/18, 08/16/18, 08/10/18. Pleural fluid was suspicious for malignancy on 10/02/18 with rare atypical PAX-8 and MOC-31 positive  cells present.   10/29/18- patient underwent exploratory laparotomy, total abdominal hysterectomy, bilateral salpingo-oophorectomy, omentectomy, and right ureterolysis. EBL 500 ml and cancer diagnosis.  She received 2 units of PRBCs during her admission postoperatively. [facility] at [facility]. Dr. Fransisca Connors, attending.   Pathology-  DIAGNOSIS A. Mesenteric nodule, excision:  - Benign inclusion with transitional type epithelium. Negative for malignancy.    B. Right pelvic peritoneum, excision: - Mesothelial-lined fibromuscular and adipose tissue. Negative for malignancy.    C. Appendices epiploica, excision: - Metastatic adenocarcinoma.    D. Omentum, omentectomy:  - Omentum. Negative for malignancy.    E. Uterus, bilateral ovaries and fallopian tubes, hysterectomy and bilateral salpingo-oophorectomy:  - Endometrioid adenocarcinoma of the endometrium. See note FIGO grade: 3 of 3. Tumor size: 3.5 cm Depth of invasion: 3 mm in a 37 mm thick myometrium Cervical involvement: Not identified  Adnexal involvement: Metastatic carcinoma involves bilateral ovaries. Lymphovascular invasion: Not identified   MSI-stable  -Abdominal fluid-negative.  No evidence of malignancy.   Genetic Testing- Microsatellite stable, TMB low (3) -Foundation One PD-L1 - TPS 0% PTEN, TP53, PIK3CA mutations  She completed 3 additional cycles of adjuvant chemotherapy with carbo-Taxol on 11/21/2018-01/02/2019.  01/21/2019-CT C/A/P 1. Interval resection of dominant abdominopelvic mass with resolution of abdominal ascites 2. Hysterectomy with asymmetric soft tissue at the left superior portion of the vaginal cuff. Cannot exclude residual or recurrent disease in this area. This could either be re-evaluated at CT follow-up of 3 months or more entirely characterized with PET 3. Otherwise, no evidence of metastatic disease in the chest, abdomen, or pelvis 4. Cholelithiasis 5.  Possible constipation  CA  125 58.3 11/21/2018 6.0 01/02/2019  She initiated pelvic radiation with  Dr. Baruch Mann 01/22/2019 because fragments of ovarian mass that were peeled off the pelvic peritoneum after main mass removed had cancer. Finished radiation in 2/20 including pelvic ERT and vaginal bracytherapy.  Family History: She reports her mother died of ovarian cancer at age 41 (diagnosed at age 59).   Problem List: Patient Active Problem List   Diagnosis Date Noted  . Chronic anticoagulation 01/02/2019  . Encounter for antineoplastic chemotherapy 01/02/2019  . Neuropathy due to chemotherapeutic drug ([place]) 01/02/2019  . S/P insertion of IVC (inferior vena caval) filter 09/26/2018  . Bilateral pulmonary embolism ([place]) 09/22/2018  . DVT (deep venous thrombosis) ([place]) 09/22/2018  . Anxiety associated with cancer diagnosis 09/07/2018  . Recurrent pleural effusion on right 09/07/2018  . Lower extremity edema 09/07/2018  . Chemotherapy-induced peripheral neuropathy ([place]) 09/07/2018  . Iron deficiency anemia due to chronic blood loss 08/10/2018  . Goals of care, counseling/discussion 08/10/2018  . Endometrial adenocarcinoma ([place]) 08/10/2018  . Pelvic mass 08/08/2018  . Abnormal uterine bleeding (AUB) 07/18/2018  . Generalized abdominal pain 07/18/2018  . Chest pain 12/03/2015  . Breathlessness on exertion 12/03/2015  . Arthritis 12/01/2015  . Secondary pulmonary hypertension 11/24/2015  . Allergic rhinitis 03/02/2010  . Varicose veins of lower extremities with inflammation 01/06/2010  . Chronic venous insufficiency 01/06/2010  . Nonrheumatic tricuspid valve disorder 12/30/2009  . Cyst of ovary 12/09/2009  . Fibroid 12/09/2009  . Family history of malignant neoplasm of ovary 12/02/2009    Past Medical History: Past Medical History:  Diagnosis Date  . Allergy   . Anemia 07/20/2018  . Endometrial adenocarcinoma ([place]) 07/2018  . Fibroid uterus 07/24/2018   5cm/2 inch   . Ovarian mass 07/24/2018   22cm/10  inch  . Pelvic mass     Past Surgical History: Past Surgical History:  Procedure Laterality Date  . EYE SURGERY  2000   Lasik  . IVC FILTER REMOVAL Right 02/07/2019   Procedure: IVC FILTER REMOVAL;  Surgeon: Algernon Huxley, MD;  Location: [facility];  Service: Cardiovascular;  Laterality: Right;  . PORTA CATH INSERTION N/A 08/13/2018   Procedure: PORTA CATH INSERTION;  Surgeon: Algernon Huxley, MD;  Location: [facility];  Service: Cardiovascular;  Laterality: N/A;  . Removal of IVC filter   02/07/2019   chemo completed     Past Gynecologic History:  G1P1 Age of Menarche: 61 Regular menstrual periods Denies abnormal pap smears Denies history of STDs  OB History:  OB History  Gravida Para Term Preterm AB Living  1 1 1     1   SAB TAB Ectopic Multiple Live Births          1    # Outcome Date GA Lbr Len/2nd Weight Sex Delivery Anes PTL Lv  1 Term 09/03/85 [redacted]w[redacted]d 9 lb 2.5 oz (4.153 kg) M Vag-Spont   LIV    Family History: Family History  Problem Relation Age of Onset  . Hypertension Father   . Ovarian cancer Mother 57      deceased 575 . Healthy Brother   . Skin cancer Paternal Grandmother        deceased 779s . Cancer Paternal Grandfather        unk. primary; deceased 673s . Ovarian cancer Other        mat grandmother's sister    Social History: Social History   Socioeconomic History  . Marital status: Married    Spouse name: Karen Mann  .  Number of children: 1  . Years of education: Not on file  . Highest education level: Not on file  Occupational History  . Not on file  Social Needs  . Financial resource strain: Not on file  . Food insecurity:    Worry: Never true    Inability: Never true  . Transportation needs:    Medical: No    Non-medical: No  Tobacco Use  . Smoking status: Never Smoker  . Smokeless tobacco: Never Used  Substance and Sexual Activity  . Alcohol use: No  . Drug use: No  . Sexual activity: Not Currently    Birth  control/protection: Post-menopausal  Lifestyle  . Physical activity:    Days per week: 0 days    Minutes per session: Not on file  . Stress: Not on file  Relationships  . Social connections:    Talks on phone: More than three times a week    Gets together: Not on file    Attends religious service: Not on file    Active member of club or organization: Not on file    Attends meetings of clubs or organizations: Not on file    Relationship status: Married  . Intimate partner violence:    Fear of current or ex partner: No    Emotionally abused: No    Physically abused: No    Forced sexual activity: No  Other Topics Concern  . Not on file  Social History Narrative  . Not on file    Allergies: Allergies  Allergen Reactions  . Paclitaxel Shortness Of Breath    Chest tightening with second round chemo  . Penicillins     Current Medications: Current Outpatient Medications  Medication Sig Dispense Refill  . apixaban (Karen Mann) 5 MG TABS tablet Take 1 tablet (5 mg total) by mouth 2 (two) times daily. 180 tablet 0  . lidocaine-prilocaine (EMLA) cream Apply to affected area once (Patient not taking: Reported on 04/24/2019) 30 g 3  . ondansetron (ZOFRAN) 8 MG tablet Take 1 tablet (8 mg total) by mouth 2 (two) times daily as needed for refractory nausea / vomiting. Start on day 3 after chemo. (Patient not taking: Reported on 04/24/2019) 30 tablet 1  . oxyCODONE (ROXICODONE) 5 MG immediate release tablet Take 1 tablet (5 mg total) by mouth every 6 (six) hours as needed for moderate pain, severe pain or breakthrough pain. (Patient not taking: Reported on 04/24/2019) 10 tablet 0  . Polyethylene Glycol 3350 (MIRALAX PO) Take 17 g by mouth daily as needed.     . prochlorperazine (COMPAZINE) 10 MG tablet Take 1 tablet (10 mg total) by mouth every 6 (six) hours as needed (Nausea or vomiting). (Patient not taking: Reported on 04/24/2019) 30 tablet 1   No current facility-administered medications for  this visit.    Review of Systems General:  no complaints Skin: no complaints Eyes: no complaints HEENT: no complaints Breasts: no complaints Pulmonary: no complaints Cardiac: no complaints Gastrointestinal: no complaints Genitourinary/Sexual: no complaints Ob/Gyn: no complaints Musculoskeletal: no complaints Hematology: no complaints Neurologic/Psych: no complaints   Objective:  Physical Examination:  LMP 08/13/2018 Comment: patient continues to bleed    ECOG Performance Status: 0 - Asymptomatic  Assessment:  Karen Mann is a 58 y.o. female diagnosed with Stage IIIA grade 3 endometrioid endometrial adenocarcinoma 8/19 on endometrial biopsy.  She had pleural effusions that required multiple thoracenteses and were suspicious for cancer.  Received 3 cycles of carbo/taxol chemotherapy and developed pulmonary emboli  and these were treated with Lovonox and IVC filter.  Then underwent TAH, BSO, omentectomy 10/29/18 for removal of uterus and very large right ovarian metastasis that was adherent to the pelvic structures. No evidence of carcinomatosis. Parts of the mass that were adherent to the pelvic peritoneum after it was removed had cancer.  Now s/p 3 additional cycles of carboplatin/taxol.  CT scan C/A/P 2/20 shows area of soft tissue asymmetry above left vaginal cuff, but I did not feel anything on pelvic exam.  External pelvic radiation and vaginal brachytherapy 2/20.  Repeat CT scan A/P 4/20 is stable.  No change in vaginal cuff area. She feels well and has no complaints.    Continues on Karen Mann for PEs diagnosed 9/19 and IVC filter removed 2/20.Marland Kitchen   Genetic Testing- Microsatellite stable, TMB low (3). Foundation One PD-L1 - TPS 0%, PTEN, TP53, PIK3CA mutations.  Germline genetic testing negative in 11/19. In vitae panel showed possible significant variant in NF1. Genetic counselor discussed significance with patient.   Plan:   Problem List Items Addressed This Visit       Genitourinary   Endometrial adenocarcinoma ([place]) - Primary   Relevant Orders   CA 125      Repeat CT scan this month shows stable tissue nodule above left vaginal cuff, hopefully scar tissue. If still concern for residual cancer in the future, biopsy could be done.  We will see her in clinic for exam in 6-8 weeks or sooner if concerning symptoms arise.  Will check CA125 as well.  In the meantime she is sheltering in place and taking all precautions to avoid COVID infection.    Karen Rutter, NP  I personally interviewed and examined the patient. Agreed with the above/below plan of care. Patient/family questions were answered.  Mellody Drown, MD   I discussed the assessment and treatment plan with the patient. The patient was provided an opportunity to ask questions and all were answered. The patient agreed with the plan and demonstrated an understanding of the instructions.   The patient was advised to call back or seek an in-person evaluation if the symptoms worsen or if the condition fails to improve as anticipated.   I provided 20 minutes of face-to-face video visit time during this encounter, and > 50% was spent counseling as documented under my assessment & plan.   Karen Rutter, DNP, AGNP-C [facility] (work cell) [phone] (office)   CC:  [address], [address] [place], [place] [address] [phone]

## 2019-05-01 ENCOUNTER — Other Ambulatory Visit: Payer: Self-pay | Admitting: *Deleted

## 2019-05-01 NOTE — Telephone Encounter (Signed)
Potassium has been discontinued by doctor

## 2019-05-08 ENCOUNTER — Inpatient Hospital Stay: Payer: 59 | Attending: Oncology

## 2019-05-08 ENCOUNTER — Other Ambulatory Visit: Payer: Self-pay

## 2019-05-08 DIAGNOSIS — Z95828 Presence of other vascular implants and grafts: Secondary | ICD-10-CM

## 2019-05-08 DIAGNOSIS — I87009 Postthrombotic syndrome without complications of unspecified extremity: Secondary | ICD-10-CM

## 2019-05-08 DIAGNOSIS — C541 Malignant neoplasm of endometrium: Secondary | ICD-10-CM | POA: Insufficient documentation

## 2019-05-08 DIAGNOSIS — Z7901 Long term (current) use of anticoagulants: Secondary | ICD-10-CM

## 2019-05-08 DIAGNOSIS — I2699 Other pulmonary embolism without acute cor pulmonale: Secondary | ICD-10-CM

## 2019-05-08 DIAGNOSIS — Z86718 Personal history of other venous thrombosis and embolism: Secondary | ICD-10-CM

## 2019-05-08 LAB — COMPREHENSIVE METABOLIC PANEL
ALT: 15 U/L (ref 0–44)
AST: 16 U/L (ref 15–41)
Albumin: 4 g/dL (ref 3.5–5.0)
Alkaline Phosphatase: 51 U/L (ref 38–126)
Anion gap: 8 (ref 5–15)
BUN: 11 mg/dL (ref 6–20)
CO2: 26 mmol/L (ref 22–32)
Calcium: 9 mg/dL (ref 8.9–10.3)
Chloride: 106 mmol/L (ref 98–111)
Creatinine, Ser: 0.79 mg/dL (ref 0.44–1.00)
GFR calc Af Amer: >60 mL/min (ref >60)
GFR calc non Af Amer: >60 mL/min (ref >60)
Glucose, Bld: 98 mg/dL (ref 70–99)
Potassium: 4 mmol/L (ref 3.5–5.1)
Sodium: 140 mmol/L (ref 135–145)
Total Bilirubin: 0.6 mg/dL (ref 0.3–1.2)
Total Protein: 6.8 g/dL (ref 6.5–8.1)

## 2019-05-08 LAB — CBC WITH DIFFERENTIAL/PLATELET
Abs Immature Granulocytes: 0.01 10*3/uL (ref 0.00–0.07)
Basophils Absolute: 0.1 10*3/uL (ref 0.0–0.1)
Basophils Relative: 1 %
Eosinophils Absolute: 0.7 10*3/uL — ABNORMAL HIGH (ref 0.0–0.5)
Eosinophils Relative: 18 %
HCT: 36.5 % (ref 36.0–46.0)
Hemoglobin: 12.5 g/dL (ref 12.0–15.0)
Immature Granulocytes: 0 %
Lymphocytes Relative: 21 %
Lymphs Abs: 0.8 10*3/uL (ref 0.7–4.0)
MCH: 31.1 pg (ref 26.0–34.0)
MCHC: 34.2 g/dL (ref 30.0–36.0)
MCV: 90.8 fL (ref 80.0–100.0)
Monocytes Absolute: 0.2 10*3/uL (ref 0.1–1.0)
Monocytes Relative: 6 %
Neutro Abs: 2 10*3/uL (ref 1.7–7.7)
Neutrophils Relative %: 54 %
Platelets: 184 10*3/uL (ref 150–400)
RBC: 4.02 MIL/uL (ref 3.87–5.11)
RDW: 11.5 % (ref 11.5–15.5)
WBC: 3.8 10*3/uL — ABNORMAL LOW (ref 4.0–10.5)
nRBC: 0 % (ref 0.0–0.2)

## 2019-05-08 MED ORDER — SODIUM CHLORIDE 0.9% FLUSH
10.0000 mL | Freq: Once | INTRAVENOUS | Status: AC
  Administered 2019-05-08: 10 mL via INTRAVENOUS
  Filled 2019-05-08: qty 10

## 2019-05-08 MED ORDER — HEPARIN SOD (PORK) LOCK FLUSH 100 UNIT/ML IV SOLN
500.0000 [IU] | Freq: Once | INTRAVENOUS | Status: AC
  Administered 2019-05-08: 500 [IU] via INTRAVENOUS

## 2019-05-09 LAB — CA 125: Cancer Antigen (CA) 125: 5.6 U/mL (ref 0.0–38.1)

## 2019-05-28 ENCOUNTER — Telehealth: Payer: Self-pay

## 2019-05-28 NOTE — Telephone Encounter (Signed)
Spoke with patient on telephone to discuss Survivorship Care Plan visit.  Patient in agreement for me to mail her a copy  Of survivorship care plan and treatment summary and to call her next Wednesday at 11:30 to review and APP will also talk to her.  Information mailed to patient today.

## 2019-06-04 ENCOUNTER — Telehealth: Payer: Self-pay | Admitting: Oncology

## 2019-06-05 ENCOUNTER — Inpatient Hospital Stay: Payer: 59 | Attending: Oncology | Admitting: Oncology

## 2019-06-05 DIAGNOSIS — Z90722 Acquired absence of ovaries, bilateral: Secondary | ICD-10-CM | POA: Insufficient documentation

## 2019-06-05 DIAGNOSIS — Z90710 Acquired absence of both cervix and uterus: Secondary | ICD-10-CM | POA: Insufficient documentation

## 2019-06-05 DIAGNOSIS — Z923 Personal history of irradiation: Secondary | ICD-10-CM | POA: Insufficient documentation

## 2019-06-05 DIAGNOSIS — Z9221 Personal history of antineoplastic chemotherapy: Secondary | ICD-10-CM | POA: Insufficient documentation

## 2019-06-05 DIAGNOSIS — C541 Malignant neoplasm of endometrium: Secondary | ICD-10-CM | POA: Insufficient documentation

## 2019-06-05 IMAGING — US US THORACENTESIS ASP PLEURAL SPACE W/IMG GUIDE
1 series · 3 of 3 positions shown · non-contrast
Comparison: 08/16/2018

CLINICAL DATA: Endometrial carcinoma with recurrent right pleural
effusion.

EXAM:
ULTRASOUND GUIDED RIGHT THORACENTESIS

[Series 1: us thoracentesis asp pleural space w/img guide · 3 of 3 slices shown]
[im 1/3]
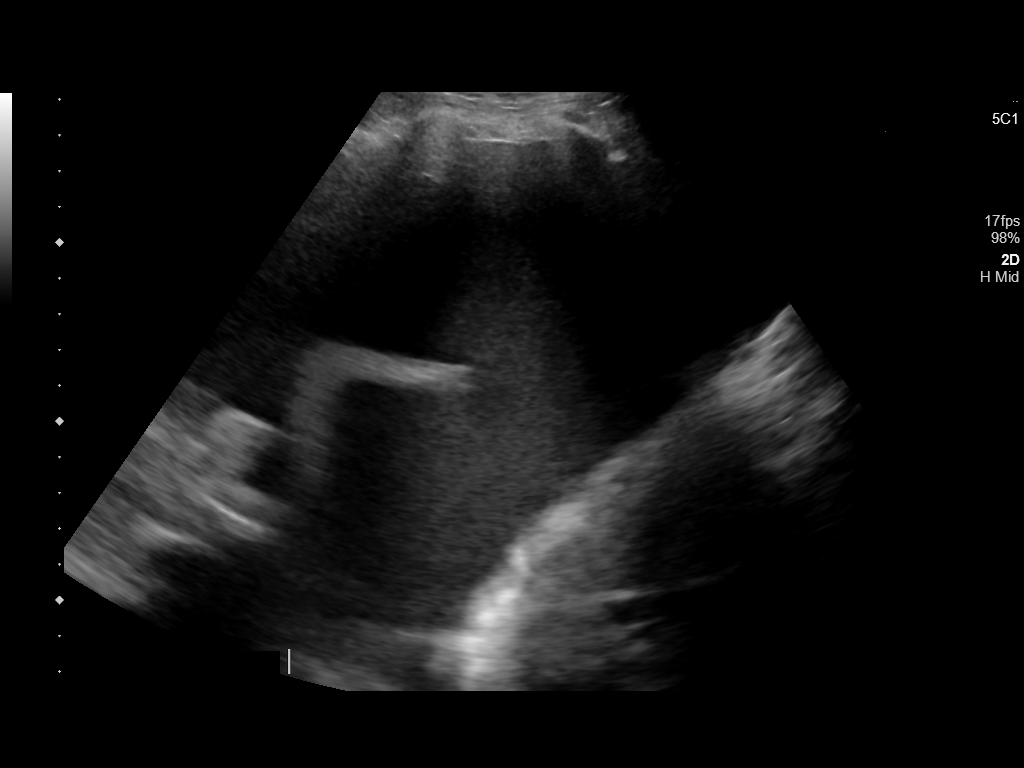
[im 2/3]
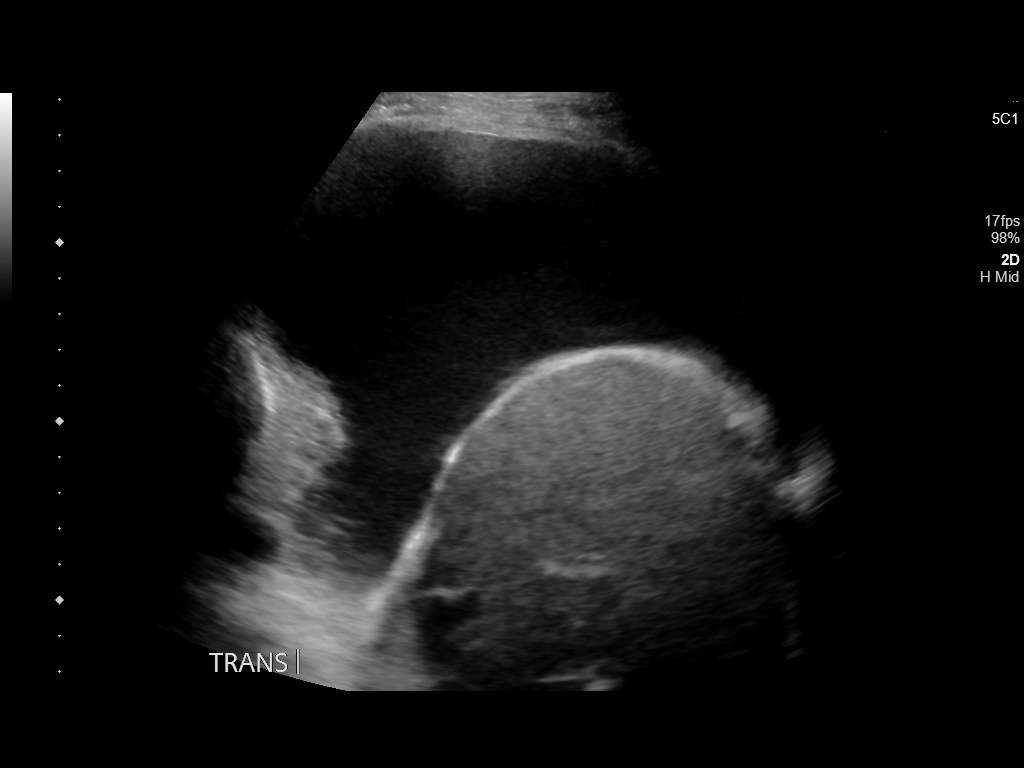
[im 3/3]
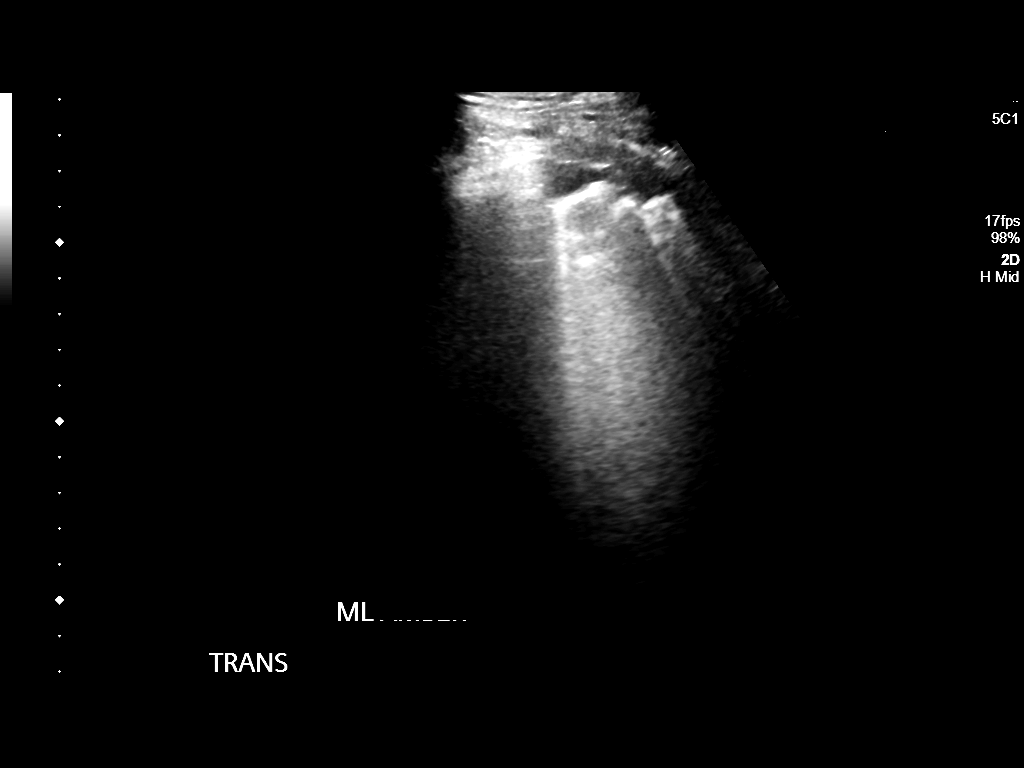

[3 of 3 positions shown; findings below may reference images not displayed]

PROCEDURE:
An ultrasound guided thoracentesis was thoroughly discussed with the
patient and questions answered. The benefits, risks, alternatives
and complications were also discussed. The patient understands and
wishes to proceed with the procedure. Written consent was obtained.
A time-out was performed prior to initiating the procedure.

Ultrasound was performed to localize and mark an adequate pocket of
fluid in the right chest. The area was then prepped and draped in
the normal sterile fashion. 1% Lidocaine was used for local
anesthesia. Under ultrasound guidance a 6 French Safe-T-Centesis
catheter was introduced. Thoracentesis was performed. The catheter
was removed and a dressing applied.

COMPLICATIONS:
None
FINDINGS: A total of approximately 3 L of amber color fluid was removed. A
fluid sample was sent for laboratory analysis.
IMPRESSION: Successful ultrasound guided right thoracentesis yielding 3 L of
pleural fluid.

## 2019-06-05 IMAGING — DX DG CHEST 1V
1 series · 1 of 1 positions shown · non-contrast
Comparison: 08/16/2018

CLINICAL DATA: Endometrial carcinoma with recurrent large right
pleural effusion. Status post right thoracentesis.

EXAM:
CHEST  1 VIEW

[chest ap]
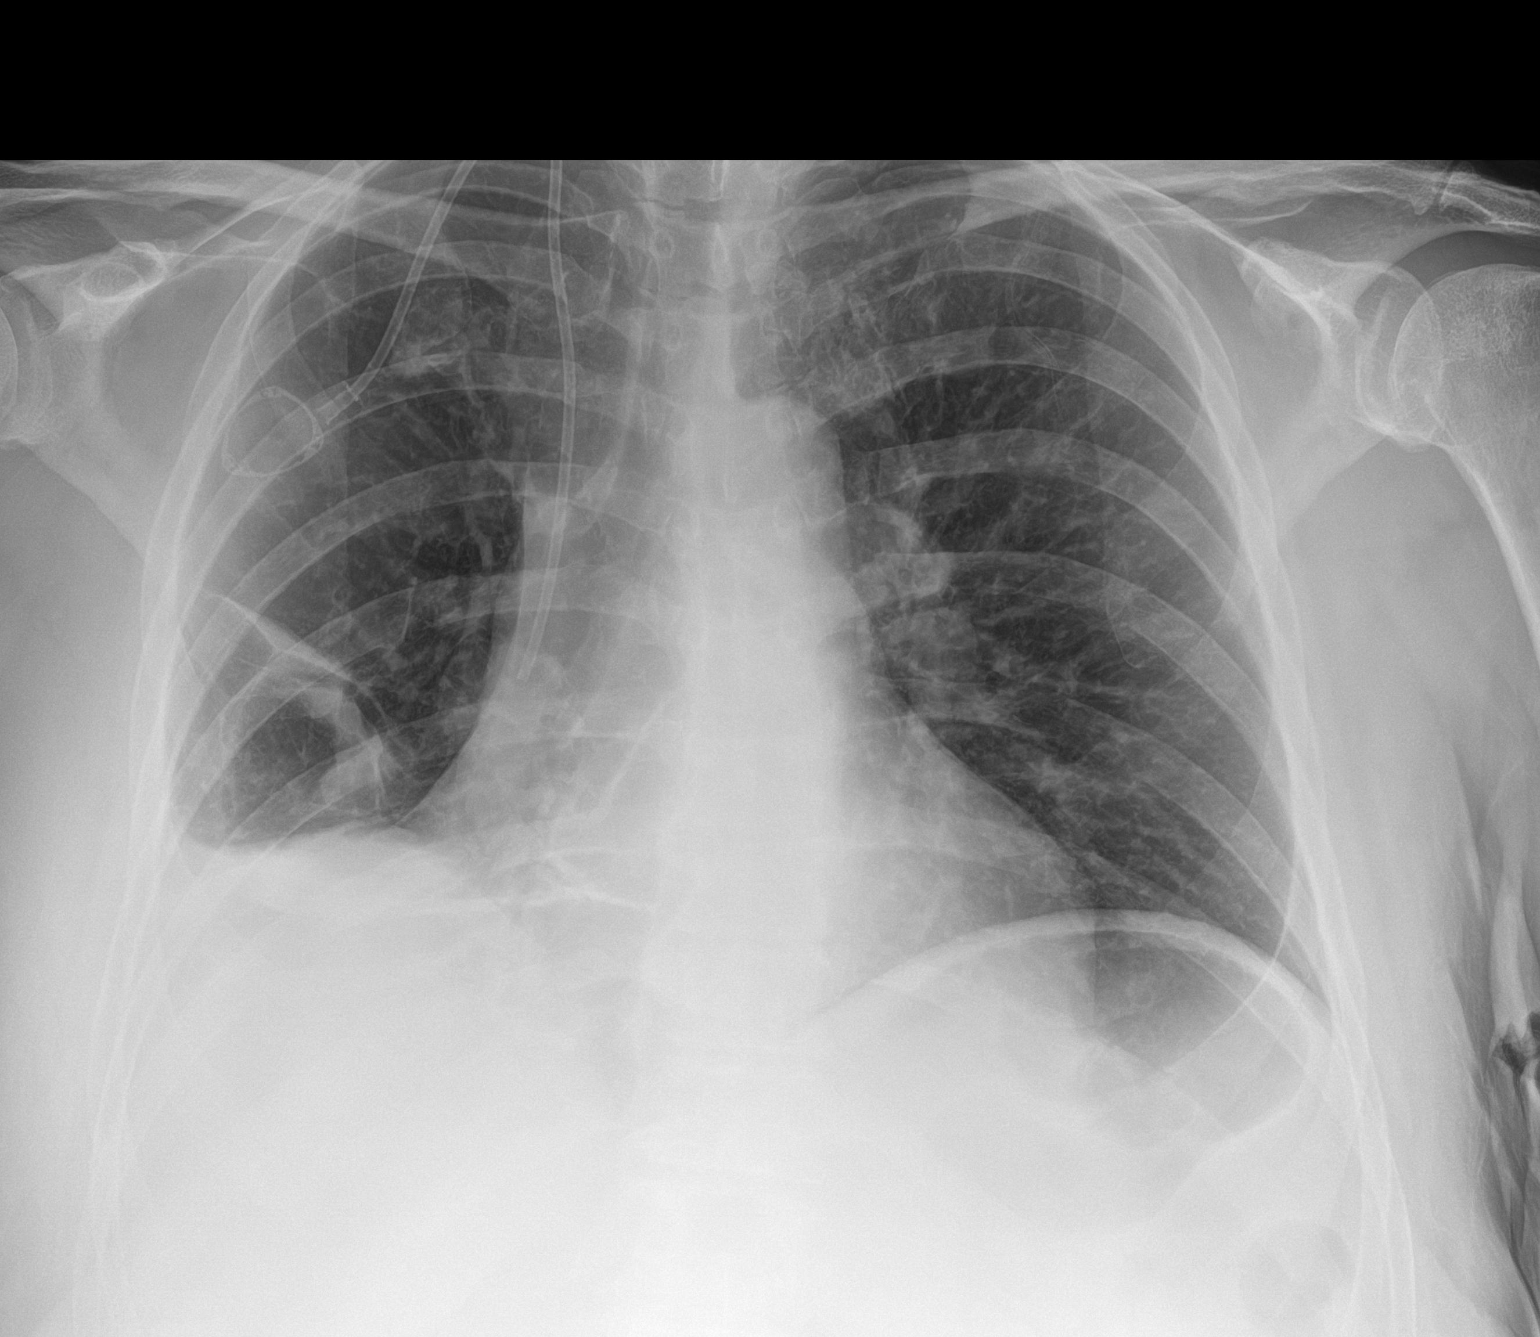

[1 of 1 positions shown; findings below may reference images not displayed]

FINDINGS: The heart size and mediastinal contours are within normal limits.
Stable appearance of indwelling Port-A-Cath. Small amount of
residual right pleural fluid after thoracentesis. No pneumothorax.
No pulmonary edema or pulmonary nodules identified. The visualized
skeletal structures are unremarkable.
IMPRESSION: Only small amount of right pleural fluid present after
thoracentesis. No pneumothorax.

## 2019-06-05 NOTE — Progress Notes (Signed)
CLINIC:  Survivorship   REASON FOR VISIT:  Routine follow-up post-treatment for a recent history of endometrial cancer  Virtual Visit via Telephone Note  I connected with Karen Mann on 06/05/19 at 11:30 AM EDT by telephone and verified that I am speaking with the correct person using two identifiers.  Location: Patient: Home Provider: Home   I discussed the limitations, risks, security and privacy concerns of performing an evaluation and management service by telephone and the availability of in person appointments. I also discussed with the patient that there may be a patient responsible charge related to this service. The patient expressed understanding and agreed to proceed.    BRIEF ONCOLOGIC HISTORY:  Oncology History Overview Note  She was seen in GYN oncology clinic and reported continual vaginal bleeding for past 5-6 months fluctuating from light to heavy (saturating tampon every hour).  She was found to be significantly anemic and started on iron pills.  She was also having significant dyspnea.   Patient initially referred to Dr. Gilman Schmidt by PCP, Dr. Brita Romp, d/t large abdominal mass seen on pelvic ultrasound concerning for ovarian cancer. Patient first noticed bloating, fatigue, and constipation in July 2019. She presented to PCP. FSH 6.4, LH 5.8.   07/23/18- Ultrasound was performed which showed: pelvic mass measuring 20 x 13 x 22 cm with cystic and solid components. Ultrasound also showed: 4-5cm fibroid and thickened endometrium of 78m  08/05/18- MR PELVIS W WO CONTRAST - 20.6 cm (17.1 x 20.6 x 20.0 cm) complex cystic/solid mass in the pelvis w/ numerous thickened/enhancing septations, compatible with malignant ovarian neoplasm. Neither ovary is discretely visualized. - Small volume abdominopelvic ascites. Gross peritoneal disease is not visualized but cannot be excluded. - Left hydroureteronephrosis secondary to extrinsic compression. - 5.3 intramural anterior  uterine body fibroid  On exam, Dr. SGilman Schmidtnoted bilateral lower extremity swelling with mass extending close to sternum, ~ 30 cm. Endometrial biopsy was performed. Pathology: Endometrioid Adenocarcinoma consistent with FIGO grade 3  CA 125  08/06/2018      622.0  08/09/18- CT C/A/P-imaging showed a large cystic/solid mass measuring 28 x 11.6 x 21.4 cm suspicious for ovarian malignancy along with moderate ascites, large right pleural effusion, and moderate left and mild right hydronephrosis attributed to ureteral compression due to mass.  08/15/2018-initiated carboplatin AUC 6 and paclitaxel 175 mg/m.  Ca 125- 692  09/05/2018-cycle 2 carboplatin AUC 6 and paclitaxel 175 mg/m.  [id number]  Mild hypersensitivity to paclitaxel with chest tightness.  Taxol. Solu-Medrol and Benadryl given along with fluid bolus.  Challenged and patient was able to tolerate treatment at slower rate.  09/21/2018-planned for hysterectomy, however, patient diagnosed with PE/DVT.  CT chest PE protocol- 1.  Bilateral pulmonary emboli, most centrally in the bilateral main pulmonary arteries.  No evidence of right heart strain or pulmonary infarct. 2.  Moderate right pleural effusion 3.  Partially visualized left hydronephrosis  UKoreabilateral lower extremities revealed 1.  Deep vein thrombus in the right popliteal vein, thrombus extends into the right tibioperoneal trunk and proximal and mid portions of the right peroneal veins. 2.  Deep vein thrombus in the left common femoral vein and in the left popliteal vein. 3.  Occlusive thrombus in seen on in the left greater saphenous vein, superficial vein.  Echo revealed LVEF 55%.   09/26/2018-underwent placement of IVC filter in IR.  She was started on therapeutic Lovenox.   She proceeded with cycle 3 carboplatin AUC 6 and paclitaxel 175 mg/m with Neulasta  support on 09/26/2018.   Ca 125- 521  10/19/2018-CT C/A/P 1. Redemonstrated large cystic and solid mass measuring  similarly in size. 2. Improved left hydronephrosis. 3.  Improved ascites with redemonstration of a moderate right pleural effusion.  Redemonstrated large cystic solid pelvic mass, dorsal to the uterus and inseparable from the ovaries, measuring approximately 22.8 x 21.9 x 12.3 cm previously measuring 21.6 x 21.2 x 14.8 cm.  Slightly improved ascites.  She underwent palliative thoracentesis on 10/26/2018, 10/11/18, 10/02/18, 09/19/18, 09/10/18, 08/30/18, 08/22/18, 08/16/18, 08/10/18. Pleural fluid was suspicious for malignancy on 10/02/18 with rare atypical PAX-8 and MOC-31 positive cells present.   10/29/18- patient underwent exploratory laparotomy, total abdominal hysterectomy, bilateral salpingo-oophorectomy, omentectomy, and right ureterolysis. EBL 500 ml and cancer diagnosis.  She received 2 units of PRBCs during her admission postoperatively. Surgery at [facility]. Dr. Fransisca Connors, attending.   Pathology-  DIAGNOSIS A. Mesenteric nodule, excision:  - Benign inclusion with transitional type epithelium. Negative for malignancy.   B. Right pelvic peritoneum, excision: - Mesothelial-lined fibromuscular and adipose tissue. Negative for malignancy.   C. Appendices epiploica, excision: - Metastatic adenocarcinoma.  D. Omentum, omentectomy:  - Omentum. Negative for malignancy.   E. Uterus, bilateral ovaries and fallopian tubes, hysterectomy and bilateral salpingo-oophorectomy:  - Endometrioid adenocarcinoma of the endometrium. See note FIGO grade: 3 of 3. Tumor size: 3.5 cm Depth of invasion: 3 mm in a 37 mm thick myometrium Cervical involvement: Not identified  Adnexal involvement: Metastatic carcinoma involves bilateral ovaries. Lymphovascular invasion: Not identified   MSI-stable  -Abdominal fluid-negative.  No evidence of malignancy.   Genetic Testing- Microsatellite stable, TMB low (3) -Foundation One PD-L1 - TPS 0% PTEN, TP53, PIK3CA mutations  She completed 3 additional cycles of  adjuvant chemotherapy with carbo-Taxol on 11/21/2018-01/02/2019.  01/21/2019-CT C/A/P 1. Interval resection of dominant abdominopelvic mass with resolution of abdominal ascites 2. Hysterectomy with asymmetric soft tissue at the left superior portion of the vaginal cuff. Cannot exclude residual or recurrent disease in this area. This could either be re-evaluated at CT follow-up of 3 months or more entirely characterized with PET 3. Otherwise, no evidence of metastatic disease in the chest, abdomen, or pelvis 4. Cholelithiasis 5. Possible constipation  CA 125 58.3     11/21/2018 6.0       01/02/2019  She initiated pelvic radiation with Dr. Baruch Gouty 01/22/2019 because fragments of ovarian mass that were peeled off the pelvic peritoneum after main mass removed had cancer. Finished radiation in 2/20 including pelvic ERT and vaginal bracytherapy.  Family History: She reports her mother died of ovarian cancer at age 13 (diagnosed at age 32).     Endometrial adenocarcinoma ([place])  08/10/2018 Initial Diagnosis   Endometrial adenocarcinoma ([place])   08/15/2018 -  Chemotherapy   The patient had palonosetron (ALOXI) injection 0.25 mg, 0.25 mg, Intravenous,  Once, 6 of 6 cycles Administration: 0.25 mg (08/15/2018), 0.25 mg (09/05/2018), 0.25 mg (09/26/2018), 0.25 mg (11/21/2018), 0.25 mg (12/12/2018), 0.25 mg (01/02/2019) pegfilgrastim (NEULASTA) injection 6 mg, 6 mg, Subcutaneous, Once, 3 of 3 cycles Administration: 6 mg (08/17/2018), 6 mg (09/07/2018), 6 mg (09/28/2018) pegfilgrastim (NEULASTA ONPRO KIT) injection 6 mg, 6 mg, Subcutaneous, Once, 3 of 3 cycles Administration: 6 mg (11/21/2018), 6 mg (01/02/2019) CARBOplatin (PARAPLATIN) 900 mg in sodium chloride 0.9 % 500 mL chemo infusion, 900 mg (100 % of original dose 900 mg), Intravenous,  Once, 6 of 6 cycles Dose modification:   (original dose 900 mg, Cycle 1), 6 mg (original dose 900 mg, Cycle  4),   (original dose 900 mg, Cycle 4, Reason: Provider  Judgment) Administration: 900 mg (08/15/2018), 900 mg (09/05/2018), 900 mg (09/26/2018), 900 mg (11/21/2018), 900 mg (12/12/2018), 900 mg (01/02/2019) PACLitaxel (TAXOL) 402 mg in sodium chloride 0.9 % 500 mL chemo infusion (> 68m/m2), 175 mg/m2 = 402 mg, Intravenous,  Once, 6 of 6 cycles Administration: 402 mg (08/15/2018), 402 mg (09/05/2018), 402 mg (09/26/2018), 402 mg (11/21/2018), 402 mg (12/12/2018), 402 mg (01/02/2019) fosaprepitant (EMEND) 150 mg, dexamethasone (DECADRON) 12 mg in sodium chloride 0.9 % 145 mL IVPB, , Intravenous,  Once, 6 of 6 cycles Administration:  (08/15/2018),  (09/05/2018),  (11/21/2018),  (12/12/2018),  (01/02/2019)  for chemotherapy treatment.    10/29/2018 Surgery       INTERVAL HISTORY:  [facility] to review her survivorship care plan detailing her treatment course for endometrial cancer,  as well as monitoring long-term side effects of that treatment, education regarding health maintenance, screening, and overall wellness and health promotion.     Overall, Karen Mann feeling quite well since completing her chemotherapy, radiation and surgery.   REVIEW OF SYSTEMS:  Review of Systems  Constitutional: Negative.  Negative for appetite change, chills, fatigue and fever.  HENT:  Negative.  Negative for hearing loss, lump/mass, mouth sores and nosebleeds.   Eyes: Negative.  Negative for eye problems.  Respiratory: Negative for cough, hemoptysis and shortness of breath.   Cardiovascular: Negative.  Negative for chest pain and leg swelling.  Gastrointestinal: Negative.  Negative for abdominal pain, blood in stool, constipation, diarrhea, nausea and vomiting.  Endocrine: Negative.  Negative for hot flashes.  Genitourinary: Negative.  Negative for bladder incontinence, difficulty urinating, dysuria, frequency and hematuria.   Musculoskeletal: Positive for arthralgias. Negative for back pain, flank pain,  gait problem and myalgias.  Skin: Negative.  Negative for itching and rash.  Neurological: Negative.  Negative for dizziness, gait problem, headaches, light-headedness and numbness.  Hematological: Negative.  Negative for adenopathy.  Psychiatric/Behavioral: Negative for confusion. The patient is not nervous/anxious.     ONCOLOGY TREATMENT TEAM:  1. Surgeon:  Dr. BFransisca Connors 2. Medical Oncologist: Dr.  YTasia Catchings3. Radiation Oncologist: Dr. CBaruch Gouty    PAST MEDICAL/SURGICAL HISTORY:  Past Medical History:  Diagnosis Date   Allergy    Anemia 07/20/2018   Endometrial adenocarcinoma ([place] 07/2018   Fibroid uterus 07/24/2018   5cm/2 inch    Ovarian mass 07/24/2018   22cm/10 inch   Pelvic mass    Past Surgical History:  Procedure Laterality Date   EYE SURGERY  2000   Lasik   IVC FILTER REMOVAL Right 02/07/2019   Procedure: IVC FILTER REMOVAL;  Surgeon: DAlgernon Huxley MD;  [facility];  Service: Cardiovascular;  Laterality: Right;   PORTA CATH INSERTION N/A 08/13/2018   Procedure: PORTA CATH INSERTION;  Surgeon: DAlgernon Huxley MD;  [facility];  Service: Cardiovascular;  Laterality: N/A;   Removal of IVC filter   02/07/2019   chemo completed      ALLERGIES:  Allergies  Allergen Reactions   Paclitaxel Shortness Of Breath    Chest tightening with second round chemo   Penicillins      CURRENT MEDICATIONS:  Outpatient Encounter Medications as of 06/05/2019  Medication Sig Note   apixaban (ELIQUIS) 2.5 MG TABS tablet Take 1 tablet (2.5 mg total) by mouth 2 (two) times daily.    [DISCONTINUED] apixaban (ELIQUIS) 5  MG TABS tablet Take 1 tablet (5 mg total) by mouth 2 (two) times daily.    [DISCONTINUED] lidocaine-prilocaine (EMLA) cream Apply to affected area once    [DISCONTINUED] ondansetron (ZOFRAN) 8 MG tablet Take 1 tablet (8 mg total) by mouth 2 (two) times daily as needed for refractory nausea / vomiting. Start on day 3 after  chemo.    [DISCONTINUED] oxyCODONE (ROXICODONE) 5 MG immediate release tablet Take 1 tablet (5 mg total) by mouth every 6 (six) hours as needed for moderate pain, severe pain or breakthrough pain.    [DISCONTINUED] Polyethylene Glycol 3350 (MIRALAX PO) Take 17 g by mouth daily as needed.  09/19/2018: Prn    [DISCONTINUED] prochlorperazine (COMPAZINE) 10 MG tablet Take 1 tablet (10 mg total) by mouth every 6 (six) hours as needed (Nausea or vomiting).    No facility-administered encounter medications on file as of 06/05/2019.      ONCOLOGIC FAMILY HISTORY:  Family History  Problem Relation Age of Onset   Hypertension Father    Ovarian cancer Mother 91       deceased 3   Healthy Brother    Skin cancer Paternal Grandmother        deceased 53s   Cancer Paternal Grandfather        unk. 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 She does not have features of NF. Not meeting criteria for diagnosis of NF1.  SOCIAL HISTORY:  Karen Mann is married and lives with her spouse in [place] , [place].  She has 1 child and they live in [place].  Karen Mann is currently working full time at [employer].  She denies any current or history of tobacco, alcohol, or illicit drug use.     PHYSICAL EXAMINATION:  Vital Signs:  There were no vitals filed for this visit. There were no vitals filed for this visit.  General: Well-nourished, well-appearing female in no acute distress.  She is unaccompanied/accompanied HEENT: Head is normocephalic.  Pupils equal and reactive to light.  Conjunctivae clear without exudate.  Sclerae anicteric. Oral mucosa is pink, moist.  Oropharynx is pink without lesions or erythema.  Lymph: No cervical, supraclavicular, or infraclavicular lymphadenopathy noted on palpation.  Cardiovascular: Regular rate and rhythm.Marland Kitchen Respiratory: Clear to auscultation bilaterally. Chest expansion symmetric; breathing non-labored.  GI: Abdomen soft and round; non-tender, non-distended. Bowel sounds normoactive.  GU: Deferred.  Neuro: No focal deficits. Steady gait.  Psych: Mood and affect normal and appropriate for situation.  Extremities: No edema. MSK: No focal spinal tenderness to palpation.  Full range of motion in bilateral upper extremities Skin: Warm and dry.  LABORATORY DATA:  Lab Results  Component Value Date   WBC 4.1 07/23/2019   HGB 13.1 07/23/2019   HCT 37.9 07/23/2019   MCV 87.9 07/23/2019   PLT 199 07/23/2019     Chemistry      Component Value Date/Time   NA 139 07/23/2019 1310   NA 137 07/17/2018 1124   K 3.9 07/23/2019 1310   CL 105 07/23/2019 1310   CO2 26 07/23/2019 1310   BUN 12 07/23/2019 1310   BUN 7 07/17/2018 1124   CREATININE 0.74 07/23/2019 1310  Component Value Date/Time   CALCIUM 9.2 07/23/2019 1310   ALKPHOS 59 07/23/2019 1310   AST 14 (L) 07/23/2019 1310   ALT 13 07/23/2019 1310   BILITOT 1.3 (H) 07/23/2019 1310   BILITOT 0.4 07/17/2018 1124     DIAGNOSTIC IMAGING:  CT abdomen pelvis with contrast from 04/19/2019  IMPRESSION: 1. Stable soft tissue density involving the left vaginal cuff. This may be postop in nature, although residual tumor at this site cannot definitely be excluded. Recommend continued follow-up by CT in 3 months. 2. No new or progressive disease identified within the abdomen or pelvis. 3. Cholelithiasis. No radiographic evidence of cholecystitis.  ASSESSMENT AND PLAN:  Ms.. Mann is a pleasant 58 y.o. female with Stage IIIa endometrial carcinoma, diagnosed in July 2019,  treated with carbo/Taxol, surgery including exploratory laparotomy, total abdominal hysterectomy, bilateral salpingo-oophorectomy, omentectomy and right ureterolysis.  Completed pelvic radiation with Dr. Donella Mann in January 2020.   She presents to the Survivorship Clinic for our initial meeting and routine follow-up post-completion of treatment for endometrial cancer.   1. Stage IIIa endometrial cancer-:  Karen Mann is continuing to recover from definitive treatment for endometrial cancer. She will follow-up with her medical oncologist, Dr. Tasia Catchings in 06/12/2019 with history and physical exam per surveillance protocol.  We discussed that this may include a physical exam every 3-6 months for 2-3 years, then 6 months - 1 year thereafter.  Blood work and imaging will be performed as clinically indicated.  She will also be followed by gynecology oncology.  This is scheduled for 06/12/2019.  Today, a comprehensive survivorship care plan and treatment summary was reviewed with the patient detailing her endometrial cancer diagnosis, treatment course, potential late-long-term effects of treatment, appropriate follow-up care with recommendations for the future, and patient education resources.  A copy of this summary, along with a letter, will be sent to the patient's primary care provider via mail/fax/in-basket.  #. Problem(s) at Visit: Overall feels well.  Has right hip arthritis and considering surgery for replacement.  #. Obesity: We discussed that obese and overweight woman her 2-4 times this likely is normal weight woman to develop endometrial cancer and extremely obese woman her approximately 7 times is likely to develop endometrial cancer.  We also discussed that obesity may worsen several aspects of cancer survivorship including quality of life, cancer recurrence, cancer progression, and prognosis.  We discussed that a normal BMI is 18.5-24.9 kg/m2. Her current BMI is No height and weight on file for this  encounter..   #. Smoking cessation: She currently does not smoke.  #. Sexual health: No concerns at this time.  #. Bone health:  Given Karen Mann age/history of endometrial cancer, she is at risk for bone demineralization.  She has not had a bone scan.  In the meantime, she was encouraged to increase her consumption of foods rich in calcium, as well as increase her weight-bearing activities.  She was given education on specific activities to promote bone health. Continue to encourage supplementation of Calcium 128m and Vitamin D 800 iu.   #. Cancer screening:  Due to Karen Mann history and her age, she should receive screening for skin cancers, colon cancer, and gynecologic cancers.  The information and recommendations are listed on the patient's comprehensive care plan/treatment summary and were reviewed in detail with the patient.    #. Health maintenance and wellness promotion: Ms. CGasawaywas encouraged to consume 5-7 servings of fruits and vegetables per day. We reviewed the "Nutrition Rainbow" handout,  as well as the handout "Take Control of Your Health and Reduce Your Cancer Risk" from the [place].  She was also encouraged to engage in moderate to vigorous exercise for 30 minutes per day most days of the week. We discussed the [employer] fitness program, which is designed for cancer survivors to help them become more physically fit after cancer treatments.  She was instructed to limit her alcohol consumption and continue to abstain from tobacco use.   #. Support services/counseling: It is not uncommon for this period of the patient's cancer care trajectory to be one of many emotions and stressors.  We discussed an opportunity for her to participate in the next session of [facility] ("Finding Your New Normal") support group series designed for patients after they have completed treatment.   Karen Mann was encouraged to take advantage of our many other support services  programs, support groups, and/or counseling in coping with her new life as a cancer survivor after completing anti-cancer treatment.  She was offered support today through active listening and expressive supportive counseling.  She was given information regarding our available services and encouraged to contact me with any questions or for help enrolling in any of our support group/programs.    Dispo:   -Return to cancer center on 06/12/2019 for MD assessment, labs and surveillance. -Follow up with surgery/gynecology oncology 06/12/2019 with Dr. Fransisca Connors. -Repeat CT abdomen pelvis will be scheduled following her medical oncology visit. -She is welcome to return back to the Survivorship Clinic at any time; no additional follow-up needed at this time.  -Consider referral back to survivorship as a long-term survivor for continued surveillance  A total of (25) minutes of non face-to-face time was spent with this patient with greater than 50% of that time in counseling and care-coordination.  Rulon Abide, AGNP Survivorship Program [place] [facility] [place]   Note: PRIMARY CARE PROVIDER Virginia Crews, [place] [phone]

## 2019-06-05 NOTE — Progress Notes (Signed)
Survivorship Care Plan visit completed.  Treatment summary previously mailed and given to patient.  ASCO answers booklet reviewed and previously mailed  to patient.  CARE program and Cancer Transitions discussed with patient along with other resources cancer center offers to patients and caregivers.  Patient verbalized understanding.

## 2019-06-11 ENCOUNTER — Other Ambulatory Visit: Payer: Self-pay

## 2019-06-12 ENCOUNTER — Other Ambulatory Visit: Payer: Self-pay

## 2019-06-12 ENCOUNTER — Encounter: Payer: Self-pay | Admitting: Nurse Practitioner

## 2019-06-12 ENCOUNTER — Other Ambulatory Visit: Payer: 59

## 2019-06-12 ENCOUNTER — Ambulatory Visit: Payer: 59

## 2019-06-12 ENCOUNTER — Inpatient Hospital Stay (HOSPITAL_BASED_OUTPATIENT_CLINIC_OR_DEPARTMENT_OTHER): Payer: 59 | Admitting: Obstetrics and Gynecology

## 2019-06-12 ENCOUNTER — Inpatient Hospital Stay: Payer: 59

## 2019-06-12 VITALS — BP 122/87 | HR 82 | Temp 97.9°F | Ht 68.0 in | Wt 209.2 lb

## 2019-06-12 DIAGNOSIS — Z90722 Acquired absence of ovaries, bilateral: Secondary | ICD-10-CM | POA: Diagnosis not present

## 2019-06-12 DIAGNOSIS — Z90710 Acquired absence of both cervix and uterus: Secondary | ICD-10-CM | POA: Diagnosis not present

## 2019-06-12 DIAGNOSIS — C541 Malignant neoplasm of endometrium: Secondary | ICD-10-CM

## 2019-06-12 DIAGNOSIS — Z9221 Personal history of antineoplastic chemotherapy: Secondary | ICD-10-CM

## 2019-06-12 DIAGNOSIS — Z923 Personal history of irradiation: Secondary | ICD-10-CM | POA: Diagnosis not present

## 2019-06-12 NOTE — Progress Notes (Signed)
Gynecologic Oncology Progress Note   Referring Provider:  Dr. Gilman Schmidt  Chief Complaint: Endometrioid adenocarcinoma  Subjective:  ZOEY BIDWELL is a 58 y.o. 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 She returns today for surveillance.   CA 125 has been followed:  01/02/2019 6.0 02/18/2019 3.9 05/08/2019 5.6  Today, feeling well in general.  No GI or GU complaints.  Has right hip arthritis and considering surgery for replacement.     Oncology History She was seen in GYN oncology clinic and reported continual vaginal bleeding for past 5-6 months fluctuating from light to heavy (saturating tampon every hour).  She was found to be significantly anemic and started on iron pills.  She was also having significant dyspnea. Patient initially referred to Dr. Gilman Schmidt by PCP, Dr. Brita Romp, d/t large abdominal mass seen on pelvic ultrasound concerning for ovarian cancer. Patient first noticed bloating, fatigue, and constipation in July 2019. She presented to PCP. FSH 6.4, LH 5.8. 07/23/18- Ultrasound was performed which showed: pelvic mass measuring 20 x 13 x 22 cm with cystic and solid components. Ultrasound also showed: 4-5cm fibroid and thickened endometrium of 31m  08/05/18- MR PELVIS W WO CONTRAST - 20.6 cm (17.1 x 20.6 x 20.0 cm) complex cystic/solid mass in the pelvis w/ numerous thickened/enhancing septations, compatible with malignant ovarian neoplasm. Neither ovary is discretely visualized. - Small volume abdominopelvic ascites. Gross peritoneal disease is not visualized but cannot be excluded. - Left hydroureteronephrosis secondary to extrinsic compression. - 5.3 intramural anterior uterine body fibroid  On exam, Dr. SGilman Schmidtnoted bilateral lower extremity swelling with mass  extending close to sternum, ~ 30 cm. Endometrial biopsy was performed. Pathology: Endometrioid Adenocarcinoma consistent with FIGO grade 3  CA 125  08/06/2018 622.0  08/09/18- CT C/A/P-imaging showed a large cystic/solid mass measuring 28 x 11.6 x 21.4 cm suspicious for ovarian malignancy along with moderate ascites, large right pleural effusion, and moderate left and mild right hydronephrosis attributed to ureteral compression due to mass.   08/15/2018-initiated carboplatin AUC 6 and paclitaxel 175 mg/m.    CA 125- 692  09/05/2018-cycle 2 carboplatin AUC 6 and paclitaxel 175 mg/m.  [id number]  Mild hypersensitivity to paclitaxel with chest tightness. Taxol. Solu-Medrol and Benadryl given along with fluid bolus.  Challenged and patient was able to tolerate treatment at slower rate.  09/21/2018-planned for hysterectomy, however, patient diagnosed with PE/DVT.  CT chest PE protocol- 1.  Bilateral pulmonary emboli, most centrally in the bilateral main pulmonary arteries.  No evidence of right heart strain or pulmonary infarct. 2.  Moderate right pleural effusion 3.  Partially visualized left hydronephrosis  UKoreabilateral lower extremities revealed 1.  Deep vein thrombus in the right popliteal vein, thrombus extends into the right tibioperoneal trunk and proximal and mid portions of the right peroneal veins. 2.  Deep vein thrombus in the left common femoral vein and in the left popliteal vein. 3.  Occlusive thrombus in seen on in the left greater saphenous vein, superficial vein.  Echo revealed LVEF 55%.   09/26/2018-underwent placement of IVC filter in IR.  She was started on therapeutic Lovenox.   She proceeded with cycle 3 carboplatin AUC 6 and paclitaxel 175 mg/m with Neulasta support on 09/26/2018.   Ca 125- 521  10/19/2018-CT C/A/P 1. Redemonstrated large cystic and solid mass measuring similarly in size. 2. Improved  left hydronephrosis. 3.  Improved ascites with redemonstration of a moderate  right pleural effusion.  Redemonstrated large cystic solid pelvic mass, dorsal to the uterus and inseparable from the ovaries, measuring approximately 22.8 x 21.9 x 12.3 cm previously measuring 21.6 x 21.2 x 14.8 cm.  Slightly improved ascites.  She underwent palliative thoracentesis on 10/26/2018, 10/11/18, 10/02/18, 09/19/18, 09/10/18, 08/30/18, 08/22/18, 08/16/18, 08/10/18. Pleural fluid was suspicious for malignancy on 10/02/18 with rare atypical PAX-8 and MOC-31 positive cells present.   10/29/18- patient underwent exploratory laparotomy, total abdominal hysterectomy, bilateral salpingo-oophorectomy, omentectomy, and right ureterolysis. EBL 500 ml and cancer diagnosis.  She received 2 units of PRBCs during her admission postoperatively. Surgery at [facility]. Dr. Fransisca Connors, attending. Removal of uterus and very large right ovarian metastasis that was adherent to the pelvic structures. No evidence of carcinomatosis. Parts of the mass that were adherent to the pelvic peritoneum after it was removed were also resected and had cancer.    Pathology-  DIAGNOSIS A. Mesenteric nodule, excision:  - Benign inclusion with transitional type epithelium. Negative for malignancy.    B. Right pelvic peritoneum, excision: - Mesothelial-lined fibromuscular and adipose tissue. Negative for malignancy.    C. Appendices epiploica, excision: - Metastatic adenocarcinoma.    D. Omentum, omentectomy:  - Omentum. Negative for malignancy.    E. Uterus, bilateral ovaries and fallopian tubes, hysterectomy and bilateral salpingo-oophorectomy:  - Endometrioid adenocarcinoma of the endometrium. See note FIGO grade: 3 of 3. Tumor size: 3.5 cm Depth of invasion: 3 mm in a 37 mm thick myometrium Cervical involvement: Not identified  Adnexal involvement: Metastatic carcinoma involves bilateral ovaries. Lymphovascular invasion: Not identified   MSI-stable  -Abdominal fluid-negative.  No evidence of malignancy.   She completed 3  additional cycles of adjuvant chemotherapy with carbo-Taxol on 11/21/2018-01/02/2019.  01/21/2019-CT C/A/P 1. Interval resection of dominant abdominopelvic mass with resolution of abdominal ascites 2. Hysterectomy with asymmetric soft tissue at the left superior portion of the vaginal cuff. Cannot exclude residual or recurrent disease in this area. This could either be re-evaluated at CT follow-up of 3 months or more entirely characterized with PET 3. Otherwise, no evidence of metastatic disease in the chest, abdomen, or pelvis 4. Cholelithiasis 5.  Possible constipation  CA 125 58.3 11/21/2018 6.0 01/02/2019  She initiated pelvic radiation with Dr. Baruch Gouty 01/22/2019 because fragments of ovarian mass that were peeled off the pelvic peritoneum after main mass removed had cancer. Finished radiation in 2/20 including pelvic ERT and vaginal bracytherapy.   CT abd/pelvis IMPRESSION: 1. Stable soft tissue density involving the left vaginal cuff. This may be postop in nature, although residual tumor at this site cannot definitely be excluded. Recommend continued follow-up by CT in 3 months. 2. No new or progressive disease identified within the abdomen or pelvis. 3. Cholelithiasis. No radiographic evidence of cholecystitis.  IVC filter removed 2/13 and she is taking Eliquis.  Genetic Testing- Microsatellite stable, TMB low (3) -Foundation One PD-L1 - TPS 0% PTEN, TP53, PIK3CA mutations  Family History: She reports her mother died of ovarian cancer at age 40 (diagnosed at age 29).  Family History  Problem Relation Age of Onset  . Hypertension Father   . Ovarian cancer Mother 49       deceased 53  . Healthy Brother   . Skin cancer Paternal Grandmother        deceased 69s  . Cancer Paternal Grandfather        unk. primary; deceased 18s  . Ovarian cancer Other  mat grandmother's sister    Problem List: Patient Active Problem List   Diagnosis Date Noted  . Chronic anticoagulation  01/02/2019  . Encounter for antineoplastic chemotherapy 01/02/2019  . Neuropathy due to chemotherapeutic drug ([place]) 01/02/2019  . S/P insertion of IVC (inferior vena caval) filter 09/26/2018  . Bilateral pulmonary embolism ([place]) 09/22/2018  . DVT (deep venous thrombosis) ([place]) 09/22/2018  . Anxiety associated with cancer diagnosis 09/07/2018  . Recurrent pleural effusion on right 09/07/2018  . Lower extremity edema 09/07/2018  . Chemotherapy-induced peripheral neuropathy ([place]) 09/07/2018  . Iron deficiency anemia due to chronic blood loss 08/10/2018  . Goals of care, counseling/discussion 08/10/2018  . Endometrial adenocarcinoma ([place]) 08/10/2018  . Pelvic mass 08/08/2018  . Abnormal uterine bleeding (AUB) 07/18/2018  . Generalized abdominal pain 07/18/2018  . Chest pain 12/03/2015  . Breathlessness on exertion 12/03/2015  . Arthritis 12/01/2015  . Secondary pulmonary hypertension 11/24/2015  . Allergic rhinitis 03/02/2010  . Varicose veins of lower extremities with inflammation 01/06/2010  . Chronic venous insufficiency 01/06/2010  . Nonrheumatic tricuspid valve disorder 12/30/2009  . Cyst of ovary 12/09/2009  . Fibroid 12/09/2009  . Family history of malignant neoplasm of ovary 12/02/2009    Past Medical History: Past Medical History:  Diagnosis Date  . Allergy   . Anemia 07/20/2018  . Endometrial adenocarcinoma ([place]) 07/2018  . Fibroid uterus 07/24/2018   5cm/2 inch   . Ovarian mass 07/24/2018   22cm/10 inch  . Pelvic mass     Past Surgical History: Past Surgical History:  Procedure Laterality Date  . EYE SURGERY  2000   Lasik  . IVC FILTER REMOVAL Right 02/07/2019   Procedure: IVC FILTER REMOVAL;  Surgeon: Algernon Huxley, MD;  Location: [facility];  Service: Cardiovascular;  Laterality: Right;  . PORTA CATH INSERTION N/A 08/13/2018   Procedure: PORTA CATH INSERTION;  Surgeon: Algernon Huxley, MD;  Location: [facility];  Service: Cardiovascular;   Laterality: N/A;  . Removal of IVC filter   02/07/2019   chemo completed     Past Gynecologic History:  G1P1 Age of Menarche: 26 Regular menstrual periods Denies abnormal pap smears Denies history of STDs  OB History:  OB History  Gravida Para Term Preterm AB Living  _0 SAB TAB Ectopic Multiple Live Births          1    # Outcome Date GA Lbr Len/2nd Weight Sex Delivery Anes PTL Lv  1 Term 09/03/85 [redacted]w[redacted]d 9 lb 2.5 oz (4.153 kg) M Vag-Spont   LIV    Family History: Family History  Problem Relation Age of Onset  . Hypertension Father   . Ovarian cancer Mother 592      deceased 530 . Healthy Brother   . Skin cancer Paternal Grandmother        deceased 757s . Cancer Paternal Grandfather        unk. primary; deceased 673s . Ovarian cancer Other        mat grandmother's sister    Social History: Social History   Socioeconomic History  . Marital status: Married    Spouse name: RHeath Lark  . Number of children: 1  . Years of education: Not on file  . Highest education level: Not on file  Occupational History  . Not on file  Social Needs  . Financial resource strain: Not on file  . Food insecurity  Worry: Never true    Inability: Never true  . Transportation needs    Medical: No    Non-medical: No  Tobacco Use  . Smoking status: Never Smoker  . Smokeless tobacco: Never Used  Substance and Sexual Activity  . Alcohol use: No  . Drug use: No  . Sexual activity: Not Currently    Birth control/protection: Post-menopausal  Lifestyle  . Physical activity    Days per week: 0 days    Minutes per session: Not on file  . Stress: Not on file  Relationships  . Social connections    Talks on phone: More than three times a week    Gets together: Not on file    Attends religious service: Not on file    Active member of club or organization: Not on file    Attends meetings of clubs or organizations: Not on file    Relationship status: Married  . Intimate  partner violence    Fear of current or ex partner: No    Emotionally abused: No    Physically abused: No    Forced sexual activity: No  Other Topics Concern  . Not on file  Social History Narrative  . Not on file    Allergies: Allergies  Allergen Reactions  . Paclitaxel Shortness Of Breath    Chest tightening with second round chemo  . Penicillins     Current Medications: Current Outpatient Medications  Medication Sig Dispense Refill  . apixaban (ELIQUIS) 2.5 MG TABS tablet Take 1 tablet (2.5 mg total) by mouth 2 (two) times daily. 60 tablet 3  . apixaban (ELIQUIS) 5 MG TABS tablet Take 1 tablet (5 mg total) by mouth 2 (two) times daily. 180 tablet 0  . lidocaine-prilocaine (EMLA) cream Apply to affected area once (Patient not taking: Reported on 04/24/2019) 30 g 3  . ondansetron (ZOFRAN) 8 MG tablet Take 1 tablet (8 mg total) by mouth 2 (two) times daily as needed for refractory nausea / vomiting. Start on day 3 after chemo. (Patient not taking: Reported on 04/24/2019) 30 tablet 1  . oxyCODONE (ROXICODONE) 5 MG immediate release tablet Take 1 tablet (5 mg total) by mouth every 6 (six) hours as needed for moderate pain, severe pain or breakthrough pain. (Patient not taking: Reported on 04/24/2019) 10 tablet 0  . Polyethylene Glycol 3350 (MIRALAX PO) Take 17 g by mouth daily as needed.     . prochlorperazine (COMPAZINE) 10 MG tablet Take 1 tablet (10 mg total) by mouth every 6 (six) hours as needed (Nausea or vomiting). (Patient not taking: Reported on 04/24/2019) 30 tablet 1   No current facility-administered medications for this visit.    Review of Systems General:  no complaints Skin: no complaints Eyes: no complaints HEENT: no complaints Breasts: no complaints Pulmonary: no complaints Cardiac: no complaints Gastrointestinal: no complaints Genitourinary/Sexual: no complaints Ob/Gyn: no complaints Musculoskeletal: right hip immobility and pain Hematology: no  complaints Neurologic/Psych: no complaints   Objective:  Physical Examination:  Today's Vitals   06/12/19 1422  Temp: 97.9 F (36.6 C)  TempSrc: Tympanic  Weight: 209 lb 3.2 oz (94.9 kg)  Height: _0  (1.727 m)  PainSc: 0-No pain   Body mass index is 31.81 kg/m.   ECOG Performance Status: 0 - Asymptomatic  GENERAL: Patient is a well appearing female in no acute distress HEENT:  Sclera clear. Anicteric NODES:  Negative axillary, supraclavicular, inguinal lymph node survery LUNGS:  Clear to auscultation bilaterally.   HEART:  Regular rate and rhythm.  ABDOMEN:  Soft, nontender.  No hernias, incisions well healed. No masses or ascites EXTREMITIES:  No peripheral edema. Atraumatic. No cyanosis. Reduced mobility in left hip. SKIN:  Clear with no obvious rashes or skin changes.  NEURO:  Nonfocal. Well oriented.  Appropriate affect.  Pelvis exam EGBUS/Vag: normal. Bimanual exam/RV: no masses or nodularity   Assessment:  MICHALINA CALBERT is a 58 y.o. female diagnosed with Stage IIIA grade 3 endometrioid endometrial adenocarcinoma 8/19 on endometrial biopsy.  She had pleural effusions that required multiple thoracenteses and were suspicious for cancer.  Received 3 cycles of carbo/taxol chemotherapy and developed pulmonary emboli and these were treated with Lovonox and IVC filter.  Then underwent TAH, BSO, omentectomy 10/29/18 for removal of uterus and very large right ovarian metastasis that was adherent to the pelvic structures. No evidence of carcinomatosis. Parts of the mass that were adherent to the pelvic peritoneum after it was removed had cancer.  Now s/p 3 additional cycles of carboplatin/taxol.  CT scan C/A/P 2/20 shows area of soft tissue asymmetry above left vaginal cuff, but I did not feel anything on pelvic exam.  External pelvic radiation and vaginal brachytherapy 2/20.  Repeat CT scan A/P 4/20 is stable.  No change in vaginal cuff area. She feels well and has no complaints.  NED    Continues on Eliquis for PEs diagnosed 9/19 and IVC filter removed 2/20.  Genetic Testing- Microsatellite stable, TMB low (3). Foundation One PD-L1 - TPS 0%, PTEN, TP53, PIK3CA mutations.  Germline genetic testing negative in 11/19. In vitae panel showed possible significant variant in NF1. Genetic counselor discussed significance with patient.   Plan:   Problem List Items Addressed This Visit      Genitourinary   Endometrial adenocarcinoma ([place]) - Primary      We will see her in clinic for exam in 3 months or sooner if concerning symptoms arise.  Will check CA125 as well.  In the meantime she is sheltering in place and taking all precautions to avoid COVID infection.     Considering hip replacement for arthritis and do not think there are any contraindications other than DVT 8 months ago.  She continues on Eliquis and still has IV port a cath.  Would leave this in for at least a year.   Mammogram will be ordered. And suggested she arrange for colonoscopy screening through PCP.  Beckey Rutter, NP  I personally interviewed and examined the patient. Agreed with the above/below plan of care. Patient/family questions were answered.   Mellody Drown, MD  CC:  Brita Romp [address], [address] [place],  [place] [address] [phone]

## 2019-06-13 LAB — CA 125: Cancer Antigen (CA) 125: 5.9 U/mL (ref 0.0–38.1)

## 2019-06-13 IMAGING — US US THORACENTESIS ASP PLEURAL SPACE W/IMG GUIDE
1 series · 6 of 6 positions shown · non-contrast
Comparison: Ultrasound-guided thoracentesis-08/22/2018;

INDICATION: History of endometrial adenocarcinoma with recurrent symptomatic
right-sided pleural effusion. Patient presents today for
ultrasound-guided right-sided thoracentesis for therapeutic
purposes.

EXAM:
US THORACENTESIS ASP PLEURAL SPACE W/IMG GUIDE
TECHNIQUE: Informed written consent was obtained from the patient after a
discussion of the risks, benefits and alternatives to treatment. A
timeout was performed prior to the initiation of the procedure.

[Series 1: us thoracentesis asp pleural space w/img guide · 6 of 6 slices shown]
[im 1/6]
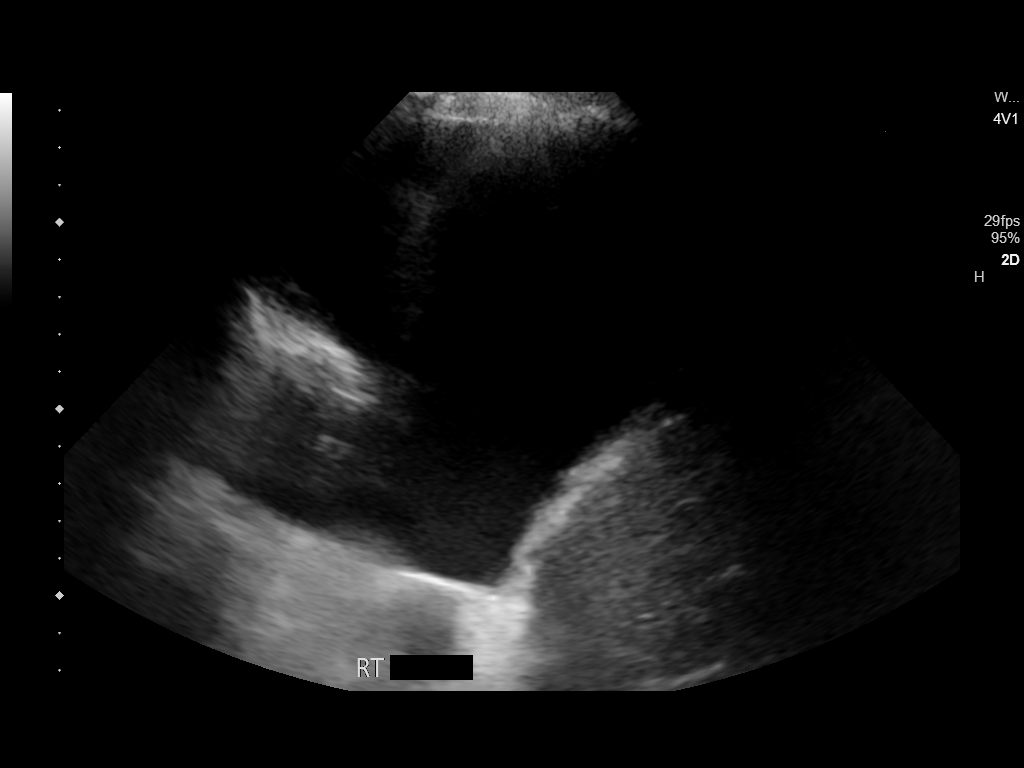
[im 2/6]
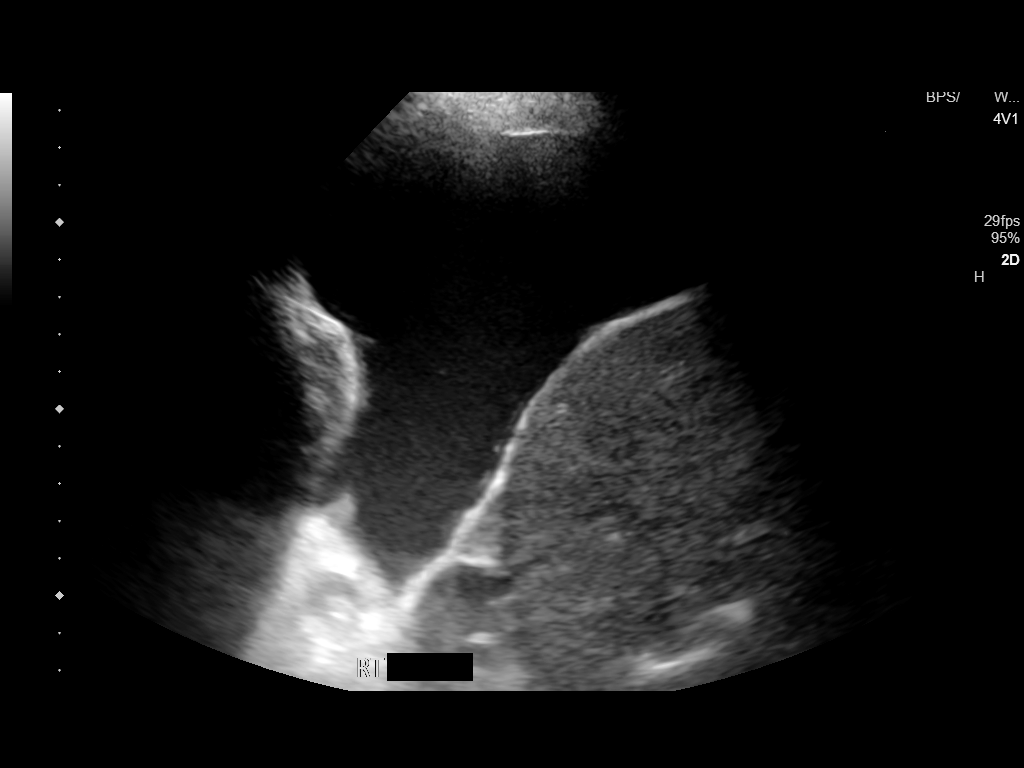
[im 3/6]
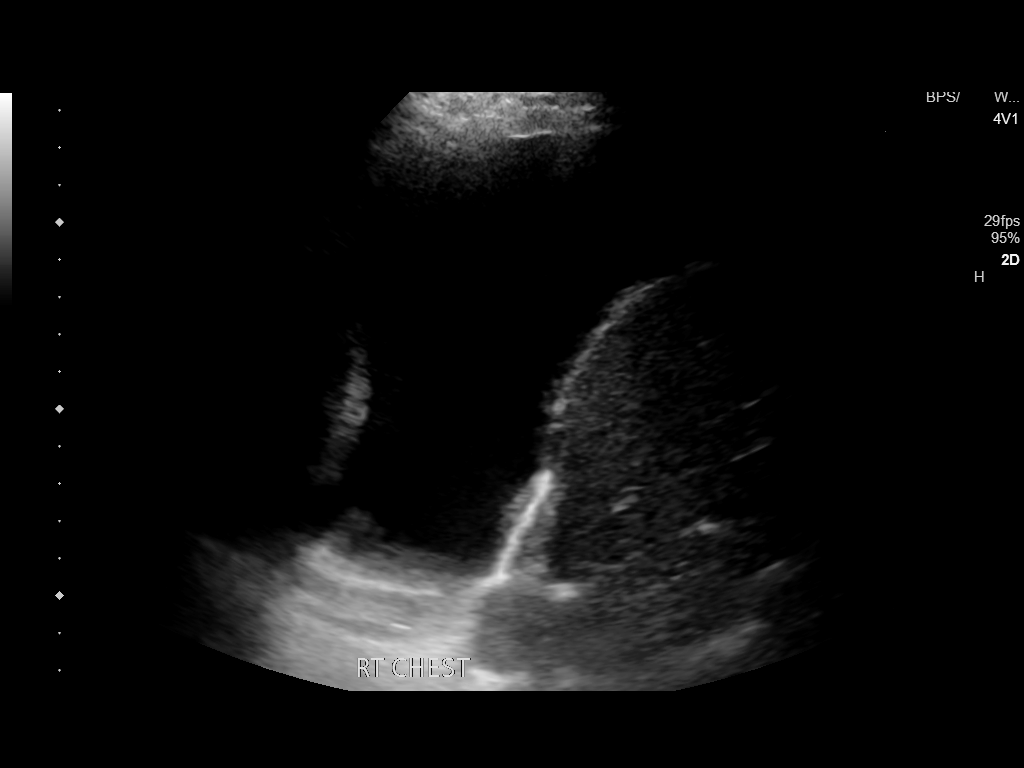
[im 4/6]
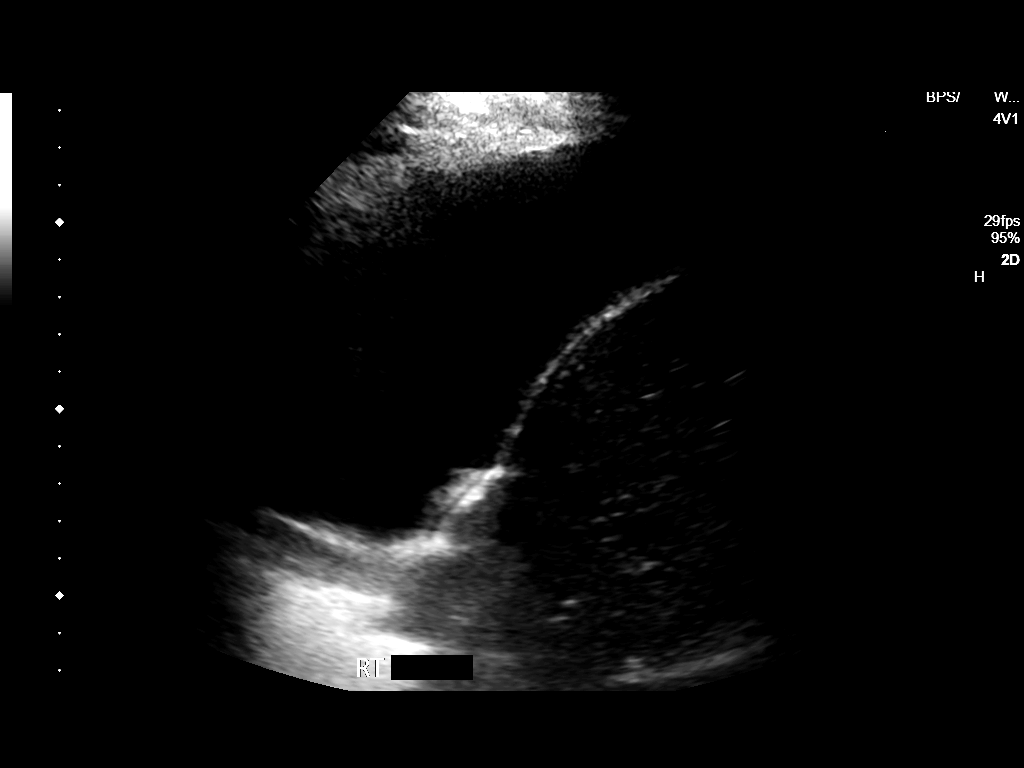
[im 5/6]
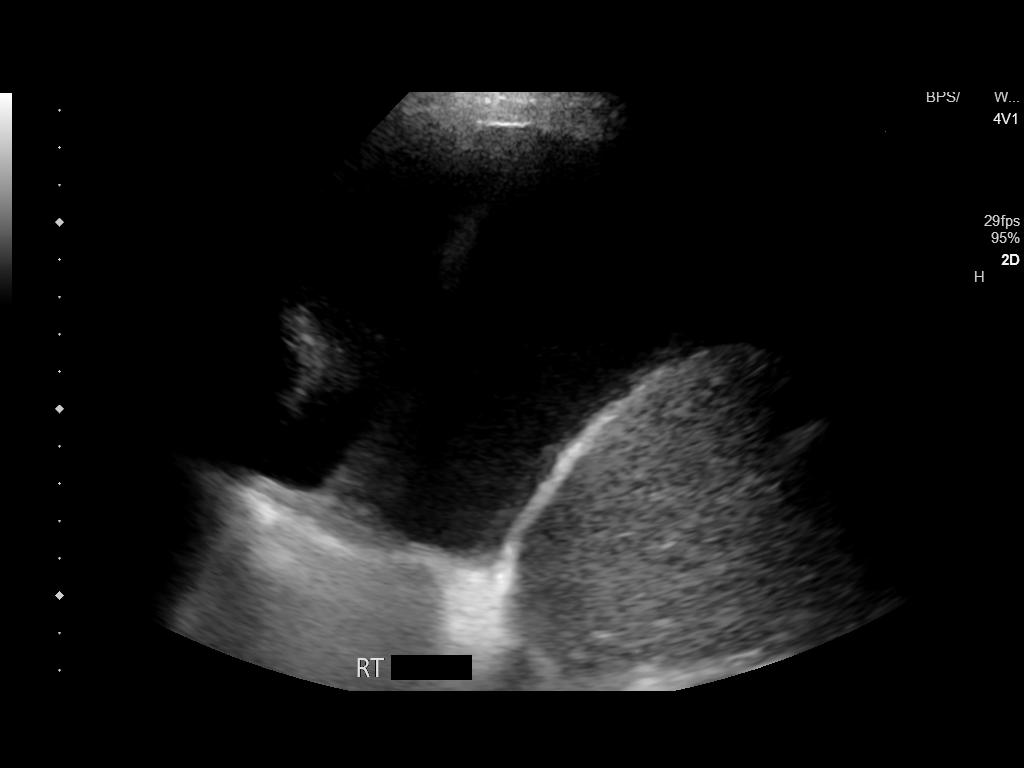
[im 6/6]
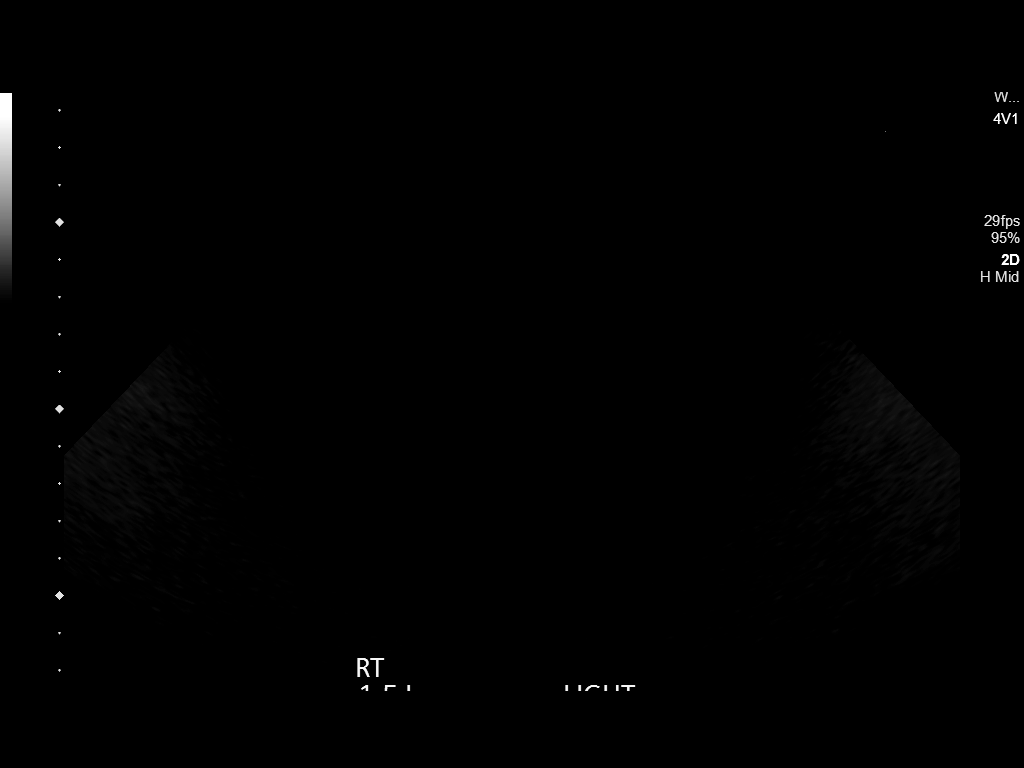

[6 of 6 positions shown; findings below may reference images not displayed]

08/16/2018;
08/10/2018; chest radiograph-08/22/2018

MEDICATIONS:
None.

COMPLICATIONS:
None immediate.
Initial ultrasound scanning demonstrates a recurrent large
right-sided anechoic pleural effusion. The lower chest was prepped
and draped in the usual sterile fashion. 1% lidocaine was used for
local anesthesia.

Under direct ultrasound guidance, a 19 gauge, 7-cm, Yueh catheter
was introduced. An ultrasound image was saved for documentation
purposes. The thoracentesis was performed. The catheter was removed
and a dressing was applied. The patient tolerated the procedure well
without immediate post procedural complication. The patient was
escorted to have an upright chest radiograph.
FINDINGS: A total of approximately 1.5 liters of serous fluid was removed.
IMPRESSION: Successful ultrasound-guided right sided thoracentesis yielding
liters of pleural fluid.

PLAN:
The patient is to start a new line of chemotherapy next week. She is
hopeful this new treatment will result in a reduction of her
recurrent symptomatic pleural fluid. If this is not case and patient
continues to require weekly large volume thoracentesis, evaluation
for placement of a palliative PleurX catheter could be considered.

## 2019-06-18 ENCOUNTER — Other Ambulatory Visit: Payer: Self-pay

## 2019-06-19 ENCOUNTER — Other Ambulatory Visit: Payer: Self-pay

## 2019-06-19 ENCOUNTER — Inpatient Hospital Stay: Payer: 59

## 2019-06-19 DIAGNOSIS — Z95828 Presence of other vascular implants and grafts: Secondary | ICD-10-CM

## 2019-06-19 DIAGNOSIS — C541 Malignant neoplasm of endometrium: Secondary | ICD-10-CM | POA: Diagnosis not present

## 2019-06-19 MED ORDER — SODIUM CHLORIDE 0.9% FLUSH
10.0000 mL | Freq: Once | INTRAVENOUS | Status: AC
  Administered 2019-06-19: 10 mL via INTRAVENOUS
  Filled 2019-06-19: qty 10

## 2019-06-19 MED ORDER — HEPARIN SOD (PORK) LOCK FLUSH 100 UNIT/ML IV SOLN
500.0000 [IU] | Freq: Once | INTRAVENOUS | Status: AC
  Administered 2019-06-19: 500 [IU] via INTRAVENOUS

## 2019-06-24 IMAGING — CR DG CHEST 1V
1 series · 1 of 1 positions shown · non-contrast
Comparison: 08/30/2018 chest radiograph.

CLINICAL DATA: Status post right thoracentesis, history of
endometrial cancer

EXAM:
CHEST  1 VIEW

[dg chest 1 view]
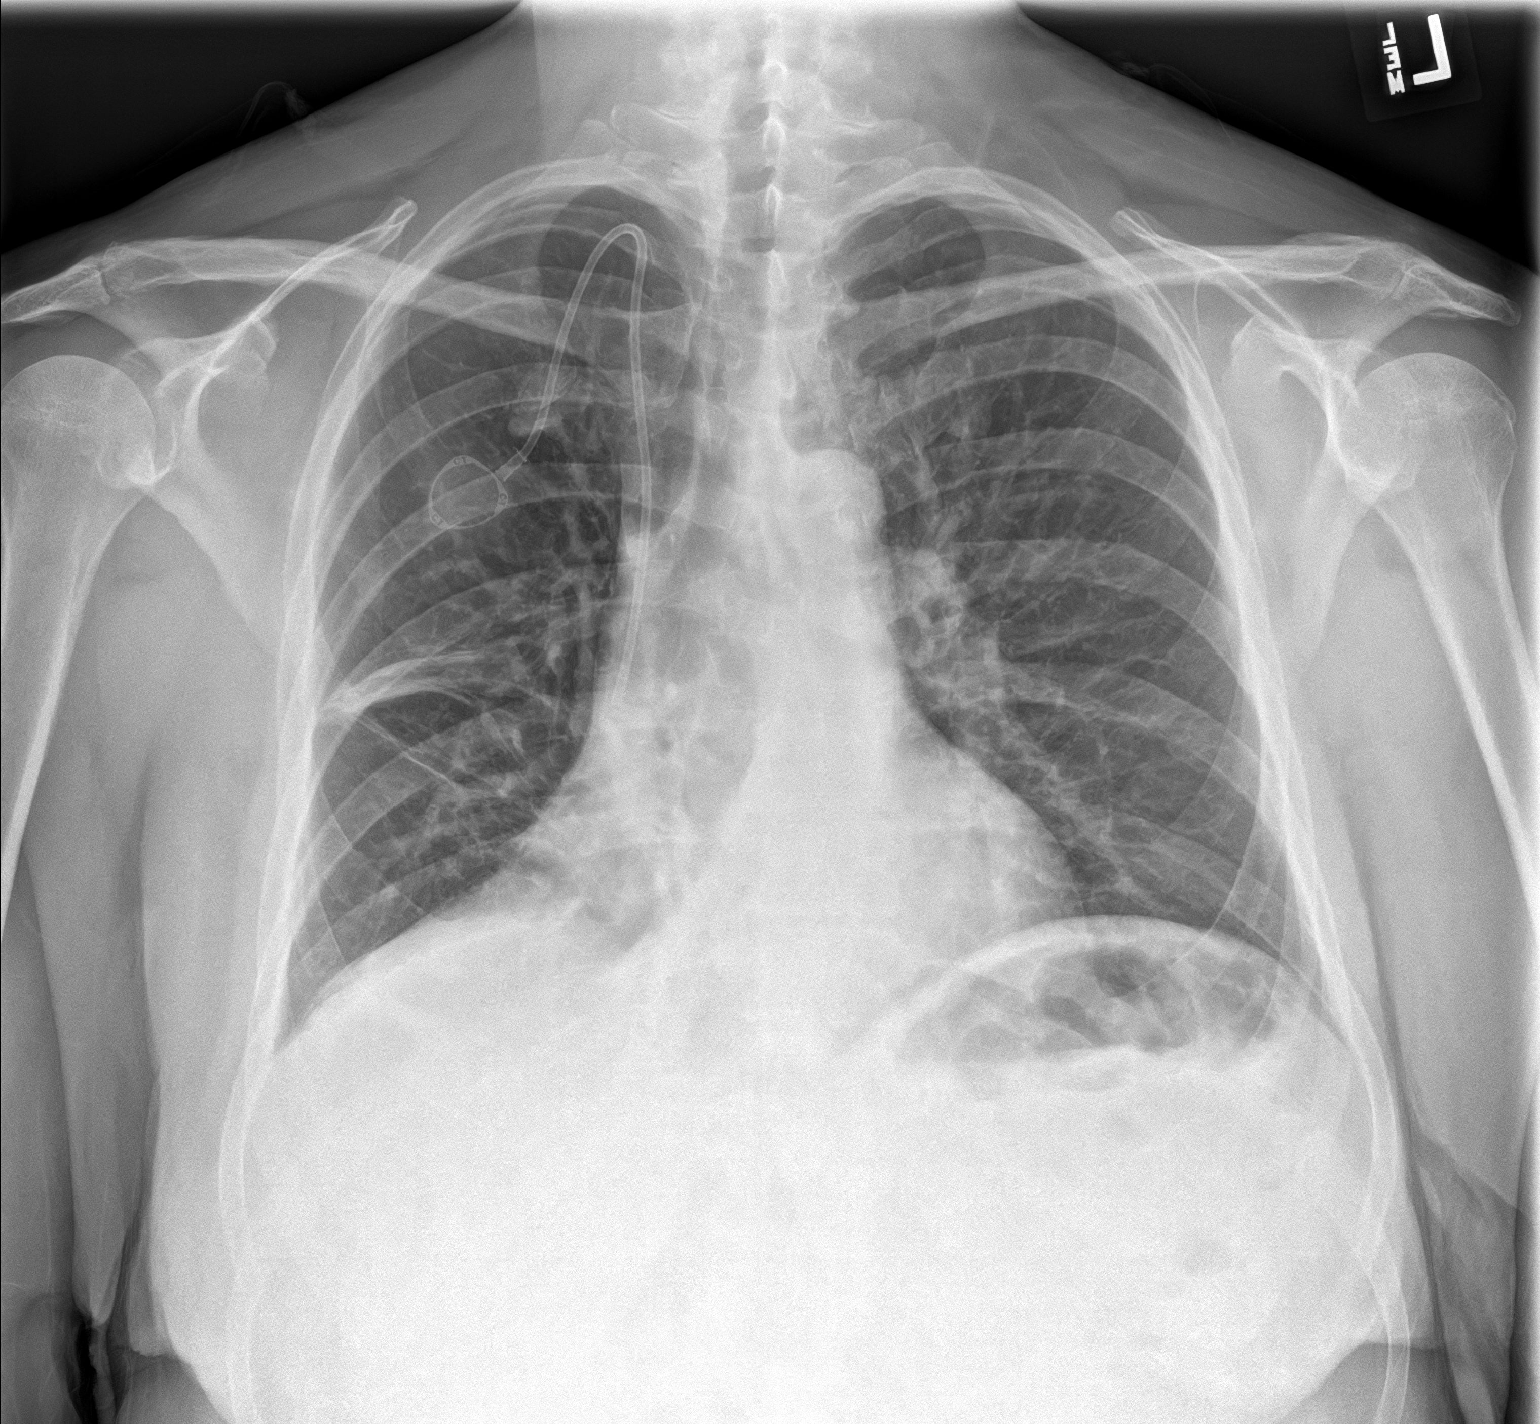

[1 of 1 positions shown; findings below may reference images not displayed]

FINDINGS: Right internal jugular MediPort terminates at the cavoatrial
junction. Stable cardiomediastinal silhouette with normal heart
size. No pneumothorax. No residual pleural effusion. No pulmonary
edema. Curvilinear opacities in the mid to lower right lung appear
stable. No acute consolidative airspace disease.
IMPRESSION: 1. No pneumothorax.  No residual right pleural effusion.
2. Stable curvilinear opacities in the mid to lower right lung
compatible with mild scarring or atelectasis.

## 2019-07-18 ENCOUNTER — Other Ambulatory Visit: Payer: Self-pay

## 2019-07-19 ENCOUNTER — Ambulatory Visit
Admission: RE | Admit: 2019-07-19 | Discharge: 2019-07-19 | Disposition: A | Discharge status: Home / Self Care | Payer: 59 | Source: Ambulatory Visit | Attending: Radiation Oncology | Admitting: Radiation Oncology

## 2019-07-19 ENCOUNTER — Encounter: Payer: Self-pay | Admitting: Radiation Oncology

## 2019-07-19 DIAGNOSIS — C541 Malignant neoplasm of endometrium: Secondary | ICD-10-CM

## 2019-07-19 NOTE — Progress Notes (Signed)
Radiation Oncology Follow up Note  Name: Karen Mann   Date:   07/19/2019 MRN:  [id number] DOB: 08/03/1961   Radiation Oncology TeleHEALTH VISIT PROGRESS NOTE  I connected with            [name]   by telephone-Webex and verified that I am speaking with the correct person using two identifiers.  I discussed the limitations, risks, security and privacy concerns of performing an evaluation and management service by telemedicine and the availability of in-person appointments. I also discussed with the patient that there may be a patient responsible charge related to this service. The patient expressed understanding and agreed to proceed.    Other persons participating in the visit and their role in the encounter:    Patient's location: Home   Provider's location: work   This 58 y.o. female presents to the telephone call today in patient status post stage IIIa grade 3 endometrial adenocarcinoma status post neoadjuvant chemotherapy followed by TAH/BSO and external beam and vaginal brachytherapy.  Now seen out 6 months  REFERRING PROVIDER: Virginia Crews, MD  HPI: Patient is a 58 year old female now about 6 months having completed adjuvant radiation therapy for.  Stage IIIa grade 3 endometrial adenocarcinoma status post 3 cycles of neoadjuvant carbotaxol followed by TAH/BSO omentectomy followed by 3 cycles of adjuvant carbotaxol and pelvic radiation therapy and vaginal brachytherapy.  She is seen today in routine follow-up is doing well specifically denies any increased lower urinary tract symptoms diarrhea fatigue.  Her last CT scan was back in April showing stable soft tissue density involving the left vaginal cuff no new progressive disease was noted.  She recently had a pelvic exam by Dr. Fransisca Connors showing no evidence of disease.  She is currently on Eliquis for pulmonary embolism and is have an IVC filter removed in February 2020.  COMPLICATIONS OF TREATMENT: none  FOLLOW UP  COMPLIANCE: keeps appointments   PHYSICAL EXAM:  LMP 08/13/2018 Comment: patient continues to bleed Physical exam not performed this was a telephone interview  RADIOLOGY RESULTS: CT scans reviewed compatible with above-stated findings  PLAN: Present time patient is doing well with no progressive disease.  I am pleased with her overall progress she sounds good very little side effect profile.  She continues close follow-up care with GYN oncology.  I have asked to see her back in 6 months for follow-up.  Patient knows to call at anytime with any concerns.  I would like to take this opportunity to thank you for allowing me to participate in the care of your patient.Noreene Filbert, MD

## 2019-07-23 ENCOUNTER — Inpatient Hospital Stay (HOSPITAL_BASED_OUTPATIENT_CLINIC_OR_DEPARTMENT_OTHER): Payer: 59 | Admitting: Oncology

## 2019-07-23 ENCOUNTER — Encounter: Payer: Self-pay | Admitting: Oncology

## 2019-07-23 ENCOUNTER — Inpatient Hospital Stay: Payer: 59 | Attending: Oncology

## 2019-07-23 ENCOUNTER — Other Ambulatory Visit: Payer: Self-pay

## 2019-07-23 VITALS — BP 124/87 | HR 68 | Temp 97.5°F | Wt 216.3 lb

## 2019-07-23 DIAGNOSIS — Z7901 Long term (current) use of anticoagulants: Secondary | ICD-10-CM | POA: Insufficient documentation

## 2019-07-23 DIAGNOSIS — C541 Malignant neoplasm of endometrium: Secondary | ICD-10-CM

## 2019-07-23 DIAGNOSIS — Z86718 Personal history of other venous thrombosis and embolism: Secondary | ICD-10-CM | POA: Insufficient documentation

## 2019-07-23 DIAGNOSIS — Z86711 Personal history of pulmonary embolism: Secondary | ICD-10-CM | POA: Diagnosis not present

## 2019-07-23 DIAGNOSIS — Z95828 Presence of other vascular implants and grafts: Secondary | ICD-10-CM

## 2019-07-23 DIAGNOSIS — I2699 Other pulmonary embolism without acute cor pulmonale: Secondary | ICD-10-CM

## 2019-07-23 LAB — CBC WITH DIFFERENTIAL/PLATELET
Abs Immature Granulocytes: 0.01 10*3/uL (ref 0.00–0.07)
Basophils Absolute: 0 10*3/uL (ref 0.0–0.1)
Basophils Relative: 1 %
Eosinophils Absolute: 0.1 10*3/uL (ref 0.0–0.5)
Eosinophils Relative: 3 %
HCT: 37.9 % (ref 36.0–46.0)
Hemoglobin: 13.1 g/dL (ref 12.0–15.0)
Immature Granulocytes: 0 %
Lymphocytes Relative: 26 %
Lymphs Abs: 1 10*3/uL (ref 0.7–4.0)
MCH: 30.4 pg (ref 26.0–34.0)
MCHC: 34.6 g/dL (ref 30.0–36.0)
MCV: 87.9 fL (ref 80.0–100.0)
Monocytes Absolute: 0.3 10*3/uL (ref 0.1–1.0)
Monocytes Relative: 7 %
Neutro Abs: 2.6 10*3/uL (ref 1.7–7.7)
Neutrophils Relative %: 63 %
Platelets: 199 10*3/uL (ref 150–400)
RBC: 4.31 MIL/uL (ref 3.87–5.11)
RDW: 11.9 % (ref 11.5–15.5)
WBC: 4.1 10*3/uL (ref 4.0–10.5)
nRBC: 0 % (ref 0.0–0.2)

## 2019-07-23 LAB — COMPREHENSIVE METABOLIC PANEL
ALT: 13 U/L (ref 0–44)
AST: 14 U/L — ABNORMAL LOW (ref 15–41)
Albumin: 4.3 g/dL (ref 3.5–5.0)
Alkaline Phosphatase: 59 U/L (ref 38–126)
Anion gap: 8 (ref 5–15)
BUN: 12 mg/dL (ref 6–20)
CO2: 26 mmol/L (ref 22–32)
Calcium: 9.2 mg/dL (ref 8.9–10.3)
Chloride: 105 mmol/L (ref 98–111)
Creatinine, Ser: 0.74 mg/dL (ref 0.44–1.00)
GFR calc Af Amer: >60 mL/min (ref >60)
GFR calc non Af Amer: >60 mL/min (ref >60)
Glucose, Bld: 90 mg/dL (ref 70–99)
Potassium: 3.9 mmol/L (ref 3.5–5.1)
Sodium: 139 mmol/L (ref 135–145)
Total Bilirubin: 1.3 mg/dL — ABNORMAL HIGH (ref 0.3–1.2)
Total Protein: 7.4 g/dL (ref 6.5–8.1)

## 2019-07-23 LAB — BILIRUBIN, DIRECT: Bilirubin, Direct: 0.2 mg/dL (ref 0.0–0.2)

## 2019-07-23 MED ORDER — SODIUM CHLORIDE 0.9% FLUSH
10.0000 mL | Freq: Once | INTRAVENOUS | Status: AC
  Administered 2019-07-23: 10 mL via INTRAVENOUS
  Filled 2019-07-23: qty 10

## 2019-07-23 MED ORDER — HEPARIN SOD (PORK) LOCK FLUSH 100 UNIT/ML IV SOLN
500.0000 [IU] | Freq: Once | INTRAVENOUS | Status: AC
  Administered 2019-07-23: 13:00:00 500 [IU] via INTRAVENOUS

## 2019-07-23 NOTE — Progress Notes (Signed)
Patient here today for follow up. Patient states no new concerns today,states "felling really good".

## 2019-07-24 NOTE — Progress Notes (Signed)
Hematology/Oncology follow up  note [facility] [phone] [phone]   Patient Care Team: Virginia Crews, MD as PCP - General (Family Medicine) Clent Jacks, RN as Registered Nurse  REFERRING PROVIDER: [place] VISIT Follow up for management of  endometrial cancer.   HISTORY OF PRESENTING ILLNESS:  Karen Mann is a  58 y.o.  female with PMH listed below who was referred to me for evaluation of newly diagnosed endometrial cancer. Patient was recently referred to GYN Dr. Gilman Schmidt for abnormal uterine bleeding. 07/23/2018 ultrasound was performed showed large pelvic mass 20 x 13 x 22 cm with cystic and solid components.  Ultrasound also showed 4.9 cm fibroid and thickened endometrium 15 mm thickness 08/05/2018 MRI pelvis with and without contrast 20.6 cm complex cystic solid mass in the pelvis with numerous thickened enhancing septations, compatible with malignant ovarian neoplasm.  Neither ovary was discretely visualized.  Small volume of ascites.  Cross paratonia disease is not visualized but not excluded.  Left hydro-ureteronephrosis secondary to extrinsic compression. 5.3 intramural anterior uterine body fibroid.  #Endometrial biopsy was performed.  Pathology showed endometrioid adenocarcinoma consistent with FIGO grade 3.  8/12/19CA125 was elevated at 622 Patient was referred to see Dr. Fransisca Connors.  She was evaluated by Dr. Meredith Mody on 08/08/2018.  Staging CT was obtained. #Images independently reviewed by me.   Staging CT showed large cystic and a solid mass likely arising from the ovaries, measures 369 0 cc, suspicious for ovarian malignancy.  Moderate ascites noted.  Raising concern for malignant peritoneal spread.  No well-defined omental caking or solid tumor deposits along a situs., -Large right pleural effusion over half of right hemithorax volume. -Liver hypodensity 2.8 x 2.8 cm -Moderate left and mild right  hydronephrosis attributed to the ureteral compression due to mass. -Other chronic image findings.  MRI pelvis with and without contrast again showed 20.6 cm complex cystic/solid mass in the pelvis.  Compatible with malignant ovarian neoplasm.  Small volume of a situs.  Cross peritoneal disease is not visualized but cannot be excluded.  Left hydroureteronephrosis secondary to compression.  #Patient was scheduled to have a diagnostic and therapeutic thoracentesis this morning.  Cytology pending. She feels breathing is much better after the thoracentesis. Mild shortness of breath, worse with exertion.  Heart palpitation as well denies any chest pain, abdominal pain. Ongoing vaginal bleeding, she passed a big clot today. Feel extremely fatigued. She feels overwhelming and very nervous about the cancer diagnosis.  She has insomnia and she has tried melatonin which did not help with the sleep.  # Dr. Fransisca Connors recommends systemic therapy with carboplatin and paclitaxel 3 cycles, reassessment for surgical evaluation.per Dr.Berchuck, not candidate for Pembrolizumab and Levetinib trial.   # 08/10/2018 thoracentesis  with 1.2 L fluid removed.  Cytology negative for malignancy. 08/16/2018 status post thoracentesis again and removed 2.2 L of mildly bloody fluid.  I discussed with Dr. Dicie Beam and she reviewed the slides there was no malignancy cells. Pleural fluid LDH 246, albumin 2.5, protein 4.2 Serum LDH 270.  Protein level 6.4 08/22/2018, another thoracentesis both liters of fluid.  Cytology negative.  Patient's case was discussed on GYN tumor conference.  Dr. Theora Gianotti recommends NexGen sequence molecular testing.  And also check for PDL 1.  # CT PE study showed bilateral pulmonary embolism with right heart strain..  Lower extremity venous Doppler showed bilateral DVT, started on anticoagulation.  10/29/2018 patient underwent debulking procedure by Dr. Fransisca Connors. Pathology showed A. Mesenteric nodule,  excision:Benign inclusion with transitional type epithelium.Negative for malignancy.  B. Right pelvic peritoneum, excision:Mesothelial-lined fibromuscular and adipose tissue.Negative for malignancy.  C. Appendices epiploica, excision:Metastatic adenocarcinoma D. Omentum, omentectomy: Omentum.Negative for malignancy.  E. Uterus, bilateral ovaries and fallopian tubes, hysterectomy and bilateral salpingo-oophorectomy:  Endometrioid adenocarcinoma of the endometrium. See note FIGO grade: 3 of 3. Tumor size: 3.5 cm Depth of invasion: 3 mm in a 37 mm thick myometrium Cervical involvement: Not identified  Adnexal involvement: Metastatic carcinoma involves bilateral ovaries. Lymphovascular invasion: Not identified   Microsatellite instability testing has been ordered on block E5. Note: The residual carcinoma in the uterus is present in a background of abundant macrophages and lymphocytes, consistent with a partial response to therapy. No significant response to therapy is identified in the tumor in the ovaries. The tumor grade is based on the combination of architectural and cytologic features.  SPECIMEN   Procedure:  Total hysterectomy and bilateral salpingo-oophorectomy    Procedure:  Omentectomy    Procedure:  Peritoneal biopsies    Specimen Integrity:  Received disrupted  TUMOR   Tumor Site:  Fundus    Histologic Type:  Endometrioid carcinoma, NOS    Histologic Grade:  FIGO grade 3    Tumor Size:  Greatest dimension in Centimeters (cm): 3.5 Centimeters (cm)   Tumor Extent:      Myometrial Invasion:  Present      Depth of Invasion in Millimeters (mm):  3 Millimeters (mm)     Myometrial Thickness in Millimeters (mm):  37 Millimeters (mm)     Percentage of Myometrial Invasion:  8 %    Adenomyosis:  Present, involved by carcinoma     Uterine Serosa Involvement:  Not identified     Lower Uterine Segment Involvement:  Not identified      Cervical Stromal Involvement:  Not identified     Other Tissue / Organ Involvement:  Right ovary     Other Tissue / Organ Involvement:  Left ovary     Other Tissue / Organ Involvement:  Appendices epiploica    Accessory Findings:      Lymphovascular Invasion:  Not identified  Margins LYMPH NODES   Regional Lymph Nodes:  No lymph nodes submitted or found  PATHOLOGIC STAGE CLASSIFICATION (pTNM, AJCC 8th Edition)   Primary Tumor (pT):  pT3a    Regional Lymph Nodes (pN):      Category (pN):  pNX    Distant Metastasis (pM):  pM1     Site(s):  Appendices epiploica  ADDITIONAL FINDINGS   Additional Pathologic Findings:  Uterine leiomyomata    #  Family history ovarian cancer and personal history of endometrial CA.Patient also has had genetic testing done which showed 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  She has discussed findings with Dietitian.  She does not have features of NF.  Not meeting criteria for diagnosis of NF1.  Patient's case was discussed on [facility] oncology tumor board.  Consensus was to continue with additional 3 cycles of carboplatin and paclitaxel.  Follow CA125 for response.  Repeat imaging after finishing chemotherapy.  Can consider radiation at that time.  # IVC filter was removed on 02/07/2019.  #08/15/2018-09/26/2018 3 cycles neoadjuvant carboplatin and Taxol #11/21/2018 - 01/02/2019, 3 cycles of adjuvant carboplatin and Taxol #03/11/2019 finished adjuvant radiation.  INTERVAL HISTORY Karen Mann is a 58 y.o. female with oncology history reviewed by me today presents for 36-monthfollow-up. Patient reports feeling pretty  well at baseline. History of bilateral pulmonary embolism, history of lower extremity DVT, thrombosis likely provoked  secondary to malignancy. She finished more than 6 months of full dose anticoagulation with Eliquis 5 mg twice daily.  She has been switched to Eliquis 2.5 mg twice daily for maintenance.  Doing well.  Denies any shortness of breath, chest pain, lower extremity swelling.  Denies any calf tenderness, chest pain, hemoptysis.  Patient was seen by Dr. Fransisca Connors in June 2020.  Notes were reviewed.  Review of Systems  Constitutional: Negative for chills, fever, malaise/fatigue and weight loss.  HENT: Negative for sore throat.   Eyes: Negative for redness.  Respiratory: Negative for cough, shortness of breath and wheezing.   Cardiovascular: Negative for chest pain, palpitations and leg swelling.  Gastrointestinal: Negative for abdominal pain, blood in stool, nausea and vomiting.  Genitourinary: Negative for dysuria.  Musculoskeletal: Negative for myalgias.  Skin: Negative for rash.  Neurological: Negative for dizziness, tingling and tremors.  Endo/Heme/Allergies: Does not bruise/bleed easily.  Psychiatric/Behavioral: Negative for hallucinations.    MEDICAL HISTORY:  Past Medical History:  Diagnosis Date  . Allergy   . Anemia 07/20/2018  . Endometrial adenocarcinoma ([place]) 07/2018  . Fibroid uterus 07/24/2018   5cm/2 inch   . Ovarian mass 07/24/2018   22cm/10 inch  . Pelvic mass     SURGICAL HISTORY: Past Surgical History:  Procedure Laterality Date  . EYE SURGERY  2000   Lasik  . IVC FILTER REMOVAL Right 02/07/2019   Procedure: IVC FILTER REMOVAL;  Surgeon: Algernon Huxley, MD;  Location: [facility];  Service: Cardiovascular;  Laterality: Right;  . PORTA CATH INSERTION N/A 08/13/2018   Procedure: PORTA CATH INSERTION;  Surgeon: Algernon Huxley, MD;  Location: [facility];  Service: Cardiovascular;  Laterality: N/A;  . Removal of IVC filter   02/07/2019   chemo completed     SOCIAL HISTORY: Social History   Socioeconomic History  . Marital status: Married    Spouse  name: Heath Lark   . Number of children: 1  . Years of education: Not on file  . Highest education level: Not on file  Occupational History  . Not on file  Social Needs  . Financial resource strain: Not on file  . Food insecurity    Worry: Never true    Inability: Never true  . Transportation needs    Medical: No    Non-medical: No  Tobacco Use  . Smoking status: Never Smoker  . Smokeless tobacco: Never Used  Substance and Sexual Activity  . Alcohol use: No  . Drug use: No  . Sexual activity: Not Currently    Birth control/protection: Post-menopausal  Lifestyle  . Physical activity    Days per week: 0 days    Minutes per session: Not on file  . Stress: Not on file  Relationships  . Social connections    Talks on phone: More than three times a week    Gets together: Not on file    Attends religious service: Not on file    Active member of club or organization: Not on file    Attends meetings of clubs or organizations: Not on file    Relationship status: Married  . Intimate partner violence    Fear of current or ex partner: No    Emotionally abused: No    Physically abused: No    Forced sexual activity: No  Other Topics Concern  . Not on file  Social History Narrative  . Not on file    FAMILY HISTORY: Family History  Problem Relation Age of Onset  . Hypertension Father   . Ovarian cancer Mother 62       deceased 10  . Healthy Brother   . Skin cancer Paternal Grandmother        deceased 44s  . Cancer Paternal Grandfather        unk. primary; deceased 52s  . Ovarian cancer Other        mat grandmother's sister    ALLERGIES:  is allergic to paclitaxel and penicillins.  MEDICATIONS:  Current Outpatient Medications  Medication Sig Dispense Refill  . apixaban (ELIQUIS) 2.5 MG TABS tablet Take 1 tablet (2.5 mg total) by mouth 2 (two) times daily. 60 tablet 3   No current facility-administered medications for this visit.      PHYSICAL EXAMINATION: ECOG  PERFORMANCE STATUS: 1 - Symptomatic but completely ambulatory Vitals:   07/23/19 1331  BP: 124/87  Pulse: 68  Temp: (!) 97.5 F (36.4 C)   Filed Weights   07/23/19 1331  Weight: 216 lb 5 oz (98.1 kg)    Physical Exam Constitutional:      General: She is not in acute distress. HENT:     Head: Normocephalic and atraumatic.  Eyes:     General: No scleral icterus.    Pupils: Pupils are equal, round, and reactive to light.  Neck:     Musculoskeletal: Normal range of motion and neck supple.  Cardiovascular:     Rate and Rhythm: Normal rate and regular rhythm.     Heart sounds: Normal heart sounds.  Pulmonary:     Effort: Pulmonary effort is normal. No respiratory distress.     Breath sounds: Normal breath sounds. No wheezing.  Abdominal:     General: Bowel sounds are normal. There is no distension.     Palpations: Abdomen is soft. There is no mass.     Tenderness: There is no abdominal tenderness.  Musculoskeletal: Normal range of motion.        General: No swelling or deformity.  Skin:    General: Skin is warm and dry.     Findings: No erythema or rash.  Neurological:     Mental Status: She is alert and oriented to person, place, and time.     Cranial Nerves: No cranial nerve deficit.     Coordination: Coordination normal.  Psychiatric:        Behavior: Behavior normal.        Thought Content: Thought content normal.        LABORATORY DATA:  I have reviewed the data as listed Lab Results  Component Value Date   WBC 4.1 07/23/2019   HGB 13.1 07/23/2019   HCT 37.9 07/23/2019   MCV 87.9 07/23/2019   PLT 199 07/23/2019   Recent Labs    02/18/19 1054 04/19/19 0839 05/08/19 1126 07/23/19 1310 07/23/19 1354  NA 140  --  140 139  --   K 3.8  --  4.0 3.9  --   CL 107  --  106 105  --   CO2 27  --  26 26  --   GLUCOSE 93  --  98 90  --   BUN 12  --  11 12  --   CREATININE 0.65 0.80 0.79 0.74  --   CALCIUM 8.8*  --  9.0 9.2  --   GFRNONAA >60  --  >60 >60   --  GFRAA >60  --  >60 >60  --   PROT 6.3*  --  6.8 7.4  --   ALBUMIN 3.7  --  4.0 4.3  --   AST 15  --  16 14*  --   ALT 13  --  15 13  --   ALKPHOS 45  --  51 59  --   BILITOT 0.6  --  0.6 1.3*  --   BILIDIR  --   --   --   --  0.2   Iron/TIBC/Ferritin/ %Sat    Component Value Date/Time   IRON 25 (L) 09/05/2018 0819   IRON 11 (L) 07/17/2018 1124   TIBC 245 (L) 09/05/2018 0819   TIBC 312 07/17/2018 1124   FERRITIN 323 (H) 09/05/2018 0819   FERRITIN 60 07/17/2018 1124   IRONPCTSAT 10 (L) 09/05/2018 0819   IRONPCTSAT 4 (LL) 07/17/2018 1124    RADIOGRAPHIC STUDIES: I have personally reviewed the radiological images as listed and agreed with the findings in the report. 08/24/1999 nineteen 2D echo showed LVEF 55 to 60%. 08/30/2018 CXR Interval reduction/resolution of right-sided pleural effusion post thoracentesis. No pneumothorax.  09/23/2018, [place] lower extremity venous bilaterally positive for bilateral DVT CT chest PE protocol showed bilateral pulmonary emboli, most centrally in the bilateral main pulmonary arteries.  There are associated with signs suggestive of right heart strain.  No evidence of pulmonary infarct.  Moderate right pleural effusion.  Partially visualized left hydronephrosis also present on prior CT.   ASSESSMENT & PLAN:  1. Endometrial adenocarcinoma ([place])     # FIGO grade 3 endometrial adenocarcinoma, pT3a Nx pM1 11/21/2018, post surgery CA125 58.3 CA1 25 has been followed with a level of 5.9 on 06/12/2019. Patient clinically doing well. He was seen by GYN oncology in June 2020 and will follow up again with GYN oncology in September 2020.  #History of bilateral PE and bilateral lower extremity DVT, Tolerating maintenance Eliquis 2.5 mg twice daily. She has enough refills. Continue Eliquis maintenance.  #Stable soft tissue nodule vaginal cuff without abnormality on pelvic exams Communicated with Dr. Fransisca Connors. Will obtain CT abdomen pelvis in 3 months prior  to her visits with GYN oncology. I will see patient in 6 months.  Alternating with GYN oncology. # Port A Cath in place, port flush Q 6weeks.  #Mild hyperbilirubinemia, obtain direct bilirubin. The patient knows to call the clinic with any problems questions or concerns.  Orders Placed This Encounter  Procedures  . Bilirubin, direct    Standing Status:   Future    Number of Occurrences:   1    Standing Expiration Date:   07/22/2020  . Comprehensive metabolic panel    Standing Status:   Future    Standing Expiration Date:   07/22/2020  . CBC with Differential/Platelet    Standing Status:   Future    Standing Expiration Date:   07/22/2020  . CA 125    Standing Status:   Future    Standing Expiration Date:   07/22/2020    Return of visit: 6 months.    Earlie Server, MD, PhD Hematology Oncology [facility] Pager- [phone] 07/24/2019

## 2019-07-25 ENCOUNTER — Encounter: Payer: Self-pay | Admitting: *Deleted

## 2019-08-26 ENCOUNTER — Other Ambulatory Visit: Payer: Self-pay | Admitting: *Deleted

## 2019-08-26 MED ORDER — APIXABAN 2.5 MG PO TABS: 2.5000 mg | ORAL_TABLET | Freq: Two times a day (BID) | ORAL | 0 refills | Status: DC

## 2019-08-26 NOTE — Telephone Encounter (Signed)
Patient called requesting 90 day prescription for her Eliquis be sent to [place] [facility]

## 2019-09-09 ENCOUNTER — Ambulatory Visit
Admission: RE | Admit: 2019-09-09 | Discharge: 2019-09-09 | Disposition: A | Discharge status: Home / Self Care | Payer: 59 | Source: Ambulatory Visit | Attending: Oncology | Admitting: Oncology

## 2019-09-09 ENCOUNTER — Other Ambulatory Visit: Payer: Self-pay

## 2019-09-09 DIAGNOSIS — C541 Malignant neoplasm of endometrium: Secondary | ICD-10-CM | POA: Insufficient documentation

## 2019-09-09 DIAGNOSIS — Z95828 Presence of other vascular implants and grafts: Secondary | ICD-10-CM | POA: Diagnosis present

## 2019-09-09 DIAGNOSIS — Z86718 Personal history of other venous thrombosis and embolism: Secondary | ICD-10-CM | POA: Diagnosis present

## 2019-09-09 DIAGNOSIS — I2699 Other pulmonary embolism without acute cor pulmonale: Secondary | ICD-10-CM

## 2019-09-09 DIAGNOSIS — Z7901 Long term (current) use of anticoagulants: Secondary | ICD-10-CM | POA: Insufficient documentation

## 2019-09-09 LAB — POCT I-STAT CREATININE: Creatinine, Ser: 0.9 mg/dL (ref 0.44–1.00)

## 2019-09-09 MED ORDER — IOHEXOL 300 MG/ML  SOLN
100.0000 mL | Freq: Once | INTRAMUSCULAR | Status: AC | PRN
  Administered 2019-09-09: 08:00:00 100 mL via INTRAVENOUS

## 2019-09-11 ENCOUNTER — Encounter: Payer: Self-pay | Admitting: Obstetrics and Gynecology

## 2019-09-11 ENCOUNTER — Other Ambulatory Visit: Payer: Self-pay

## 2019-09-11 ENCOUNTER — Inpatient Hospital Stay: Payer: 59

## 2019-09-11 ENCOUNTER — Telehealth: Payer: Self-pay

## 2019-09-11 ENCOUNTER — Inpatient Hospital Stay: Payer: 59 | Attending: Obstetrics and Gynecology | Admitting: Obstetrics and Gynecology

## 2019-09-11 VITALS — BP 142/89 | HR 90 | Temp 97.2°F | Resp 20 | Ht 68.0 in | Wt 223.2 lb

## 2019-09-11 DIAGNOSIS — M1611 Unilateral primary osteoarthritis, right hip: Secondary | ICD-10-CM | POA: Insufficient documentation

## 2019-09-11 DIAGNOSIS — I872 Venous insufficiency (chronic) (peripheral): Secondary | ICD-10-CM | POA: Insufficient documentation

## 2019-09-11 DIAGNOSIS — Z923 Personal history of irradiation: Secondary | ICD-10-CM | POA: Diagnosis not present

## 2019-09-11 DIAGNOSIS — Z9221 Personal history of antineoplastic chemotherapy: Secondary | ICD-10-CM

## 2019-09-11 DIAGNOSIS — Z86718 Personal history of other venous thrombosis and embolism: Secondary | ICD-10-CM | POA: Diagnosis not present

## 2019-09-11 DIAGNOSIS — R188 Other ascites: Secondary | ICD-10-CM | POA: Insufficient documentation

## 2019-09-11 DIAGNOSIS — I8390 Asymptomatic varicose veins of unspecified lower extremity: Secondary | ICD-10-CM | POA: Insufficient documentation

## 2019-09-11 DIAGNOSIS — Z90710 Acquired absence of both cervix and uterus: Secondary | ICD-10-CM

## 2019-09-11 DIAGNOSIS — C541 Malignant neoplasm of endometrium: Secondary | ICD-10-CM | POA: Diagnosis present

## 2019-09-11 DIAGNOSIS — Z90722 Acquired absence of ovaries, bilateral: Secondary | ICD-10-CM | POA: Diagnosis not present

## 2019-09-11 DIAGNOSIS — I2729 Other secondary pulmonary hypertension: Secondary | ICD-10-CM | POA: Insufficient documentation

## 2019-09-11 DIAGNOSIS — Z7901 Long term (current) use of anticoagulants: Secondary | ICD-10-CM | POA: Diagnosis not present

## 2019-09-11 DIAGNOSIS — Z95828 Presence of other vascular implants and grafts: Secondary | ICD-10-CM

## 2019-09-11 MED ORDER — APIXABAN 2.5 MG PO TABS: 2.5000 mg | ORAL_TABLET | Freq: Two times a day (BID) | ORAL | 0 refills | Status: DC

## 2019-09-11 MED ORDER — SODIUM CHLORIDE 0.9% FLUSH
10.0000 mL | Freq: Once | INTRAVENOUS | Status: AC
  Administered 2019-09-11: 10 mL via INTRAVENOUS
  Filled 2019-09-11: qty 10

## 2019-09-11 MED ORDER — HEPARIN SOD (PORK) LOCK FLUSH 100 UNIT/ML IV SOLN
500.0000 [IU] | Freq: Once | INTRAVENOUS | Status: AC
  Administered 2019-09-11: 500 [IU] via INTRAVENOUS

## 2019-09-11 NOTE — Progress Notes (Signed)
Gynecologic Oncology Progress Note   Referring Provider:  Dr. Gilman Schmidt  Chief Complaint: Endometrioid adenocarcinoma  Subjective:  Karen Mann is a 58 y.o. 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 She returns today for surveillance.   CA 125 has been followed:  01/02/2019 6.0 02/18/2019 3.9 05/08/2019 5.6  Today, feeling well in general.  No GI or GU complaints.  Has right hip arthritis and considering surgery for replacement. Still on Eliquis for VTE and still have IV port.    Oncology History She was seen in GYN oncology clinic and reported continual vaginal bleeding for past 5-6 months fluctuating from light to heavy (saturating tampon every hour).  She was found to be significantly anemic and started on iron pills.  She was also having significant dyspnea. Patient initially referred to Dr. Gilman Schmidt by PCP, Dr. Brita Romp, d/t large abdominal mass seen on pelvic ultrasound concerning for ovarian cancer. Patient first noticed bloating, fatigue, and constipation in July 2019. She presented to PCP. FSH 6.4, LH 5.8. 07/23/18- Ultrasound was performed which showed: pelvic mass measuring 20 x 13 x 22 cm with cystic and solid components. Ultrasound also showed: 4-5cm fibroid and thickened endometrium of 78m  08/05/18- MR PELVIS W WO CONTRAST - 20.6 cm (17.1 x 20.6 x 20.0 cm) complex cystic/solid mass in the pelvis w/ numerous thickened/enhancing septations, compatible with malignant ovarian neoplasm. Neither ovary is discretely visualized. - Small volume abdominopelvic ascites. Gross peritoneal disease is not visualized but cannot be excluded. - Left hydroureteronephrosis secondary to extrinsic compression. - 5.3 intramural anterior uterine body fibroid  On exam, Dr. SGilman Schmidtnoted  bilateral lower extremity swelling with mass extending close to sternum, ~ 30 cm. Endometrial biopsy was performed. Pathology: Endometrioid Adenocarcinoma consistent with FIGO grade 3  CA 125  08/06/2018 622.0  08/09/18- CT C/A/P-imaging showed a large cystic/solid mass measuring 28 x 11.6 x 21.4 cm suspicious for ovarian malignancy along with moderate ascites, large right pleural effusion, and moderate left and mild right hydronephrosis attributed to ureteral compression due to mass.   08/15/2018-initiated carboplatin AUC 6 and paclitaxel 175 mg/m.    CA 125- 692  09/05/2018-cycle 2 carboplatin AUC 6 and paclitaxel 175 mg/m.  [id number]  Mild hypersensitivity to paclitaxel with chest tightness. Taxol. Solu-Medrol and Benadryl given along with fluid bolus.  Challenged and patient was able to tolerate treatment at slower rate.  09/21/2018-planned for hysterectomy, however, patient diagnosed with PE/DVT.  CT chest PE protocol- 1.  Bilateral pulmonary emboli, most centrally in the bilateral main pulmonary arteries.  No evidence of right heart strain or pulmonary infarct. 2.  Moderate right pleural effusion 3.  Partially visualized left hydronephrosis  UKoreabilateral lower extremities revealed 1.  Deep vein thrombus in the right popliteal vein, thrombus extends into the right tibioperoneal trunk and proximal and mid portions of the right peroneal veins. 2.  Deep vein thrombus in the left common femoral vein and in the left popliteal vein. 3.  Occlusive thrombus in seen on in the left greater saphenous vein, superficial vein.  Echo revealed LVEF 55%.   09/26/2018-underwent placement of IVC filter in IR.  She was started on therapeutic Lovenox.   She proceeded with cycle 3 carboplatin AUC 6 and paclitaxel 175 mg/m with Neulasta support on 09/26/2018.   Ca 125- 521  10/19/2018-CT C/A/P 1. Redemonstrated large cystic  and solid mass measuring similarly in size. 2. Improved left hydronephrosis. 3.  Improved  ascites with redemonstration of a moderate right pleural effusion.  Redemonstrated large cystic solid pelvic mass, dorsal to the uterus and inseparable from the ovaries, measuring approximately 22.8 x 21.9 x 12.3 cm previously measuring 21.6 x 21.2 x 14.8 cm.  Slightly improved ascites.  She underwent palliative thoracentesis on 10/26/2018, 10/11/18, 10/02/18, 09/19/18, 09/10/18, 08/30/18, 08/22/18, 08/16/18, 08/10/18. Pleural fluid was suspicious for malignancy on 10/02/18 with rare atypical PAX-8 and MOC-31 positive cells present.   10/29/18- patient underwent exploratory laparotomy, total abdominal hysterectomy, bilateral salpingo-oophorectomy, omentectomy, and right ureterolysis. EBL 500 ml and cancer diagnosis.  She received 2 units of PRBCs during her admission postoperatively. Surgery at [facility]. Dr. Fransisca Connors, attending. Removal of uterus and very large right ovarian metastasis that was adherent to the pelvic structures. No evidence of carcinomatosis. Parts of the mass that were adherent to the pelvic peritoneum after it was removed were also resected and had cancer.    Pathology-  DIAGNOSIS A. Mesenteric nodule, excision:  - Benign inclusion with transitional type epithelium. Negative for malignancy.    B. Right pelvic peritoneum, excision: - Mesothelial-lined fibromuscular and adipose tissue. Negative for malignancy.    C. Appendices epiploica, excision: - Metastatic adenocarcinoma.    D. Omentum, omentectomy:  - Omentum. Negative for malignancy.    E. Uterus, bilateral ovaries and fallopian tubes, hysterectomy and bilateral salpingo-oophorectomy:  - Endometrioid adenocarcinoma of the endometrium. See note FIGO grade: 3 of 3. Tumor size: 3.5 cm Depth of invasion: 3 mm in a 37 mm thick myometrium Cervical involvement: Not identified  Adnexal involvement: Metastatic carcinoma involves bilateral ovaries. Lymphovascular invasion: Not identified   MSI-stable  -Abdominal fluid-negative.  No  evidence of malignancy.   She completed 3 additional cycles of adjuvant chemotherapy with carbo-Taxol on 11/21/2018-01/02/2019.  01/21/2019-CT C/A/P 1. Interval resection of dominant abdominopelvic mass with resolution of abdominal ascites 2. Hysterectomy with asymmetric soft tissue at the left superior portion of the vaginal cuff. Cannot exclude residual or recurrent disease in this area. This could either be re-evaluated at CT follow-up of 3 months or more entirely characterized with PET 3. Otherwise, no evidence of metastatic disease in the chest, abdomen, or pelvis 4. Cholelithiasis 5.  Possible constipation  CA 125 58.3 11/21/2018 6.0 01/02/2019  She initiated pelvic radiation with Dr. Baruch Gouty 01/22/2019 because fragments of ovarian mass that were peeled off the pelvic peritoneum after main mass removed had cancer. Finished radiation in 2/20 including pelvic ERT and vaginal bracytherapy.   CT abd/pelvis IMPRESSION: 1. Stable soft tissue density involving the left vaginal cuff. This may be postop in nature, although residual tumor at this site cannot definitely be excluded. Recommend continued follow-up by CT in 3 months. 2. No new or progressive disease identified within the abdomen or pelvis. 3. Cholelithiasis. No radiographic evidence of cholecystitis.  IVC filter removed 2/13 and she is taking Eliquis.  Genetic Testing- Microsatellite stable, TMB low (3) -Foundation One PD-L1 - TPS 0% PTEN, TP53, PIK3CA mutations  Family History: She reports her mother died of ovarian cancer at age 37 (diagnosed at age 72).  Family History  Problem Relation Age of Onset  . Hypertension Father   . Ovarian cancer Mother 57       deceased 85  . Healthy Brother   . Skin cancer Paternal Grandmother        deceased 34s  . Cancer Paternal Grandfather  unk. primary; deceased 79s  . Ovarian cancer Other        mat grandmother's sister    Problem List: Patient Active Problem List    Diagnosis Date Noted  . Chronic anticoagulation 01/02/2019  . Encounter for antineoplastic chemotherapy 01/02/2019  . Neuropathy due to chemotherapeutic drug ([place]) 01/02/2019  . S/P insertion of IVC (inferior vena caval) filter 09/26/2018  . Bilateral pulmonary embolism ([place]) 09/22/2018  . DVT (deep venous thrombosis) ([place]) 09/22/2018  . Anxiety associated with cancer diagnosis 09/07/2018  . Recurrent pleural effusion on right 09/07/2018  . Lower extremity edema 09/07/2018  . Chemotherapy-induced peripheral neuropathy ([place]) 09/07/2018  . Iron deficiency anemia due to chronic blood loss 08/10/2018  . Goals of care, counseling/discussion 08/10/2018  . Endometrial adenocarcinoma ([place]) 08/10/2018  . Pelvic mass 08/08/2018  . Abnormal uterine bleeding (AUB) 07/18/2018  . Generalized abdominal pain 07/18/2018  . Chest pain 12/03/2015  . Breathlessness on exertion 12/03/2015  . Arthritis 12/01/2015  . Secondary pulmonary hypertension 11/24/2015  . Allergic rhinitis 03/02/2010  . Varicose veins of lower extremities with inflammation 01/06/2010  . Chronic venous insufficiency 01/06/2010  . Nonrheumatic tricuspid valve disorder 12/30/2009  . Cyst of ovary 12/09/2009  . Fibroid 12/09/2009  . Family history of malignant neoplasm of ovary 12/02/2009    Past Medical History: Past Medical History:  Diagnosis Date  . Allergy   . Anemia 07/20/2018  . Endometrial adenocarcinoma ([place]) 07/2018  . Fibroid uterus 07/24/2018   5cm/2 inch   . Ovarian mass 07/24/2018   22cm/10 inch  . Pelvic mass     Past Surgical History: Past Surgical History:  Procedure Laterality Date  . EYE SURGERY  2000   Lasik  . IVC FILTER REMOVAL Right 02/07/2019   Procedure: IVC FILTER REMOVAL;  Surgeon: Algernon Huxley, MD;  Location: [facility];  Service: Cardiovascular;  Laterality: Right;  . PORTA CATH INSERTION N/A 08/13/2018   Procedure: PORTA CATH INSERTION;  Surgeon: Algernon Huxley, MD;  Location: [facility];  Service: Cardiovascular;  Laterality: N/A;  . Removal of IVC filter   02/07/2019   chemo completed     Past Gynecologic History:  G1P1 Age of Menarche: 62 Regular menstrual periods Denies abnormal pap smears Denies history of STDs  OB History:  OB History  Gravida Para Term Preterm AB Living  1 1 1     1   SAB TAB Ectopic Multiple Live Births          1    # Outcome Date GA Lbr Len/2nd Weight Sex Delivery Anes PTL Lv  1 Term 09/03/85 [redacted]w[redacted]d 9 lb 2.5 oz (4.153 kg) M Vag-Spont   LIV    Family History: Family History  Problem Relation Age of Onset  . Hypertension Father   . Ovarian cancer Mother 535      deceased 588 . Healthy Brother   . Skin cancer Paternal Grandmother        deceased 747s . Cancer Paternal Grandfather        unk. primary; deceased 671s . Ovarian cancer Other        mat grandmother's sister    Social History: Social History   Socioeconomic History  . Marital status: Married    Spouse name: Karen Mann  . Number of children: 1  . Years of education: Not on file  . Highest education level: Not on file  Occupational History  . Not on file  Social Needs  . Financial resource strain: Not on file  . Food insecurity    Worry: Never true    Inability: Never true  . Transportation needs    Medical: No    Non-medical: No  Tobacco Use  . Smoking status: Never Smoker  . Smokeless tobacco: Never Used  Substance and Sexual Activity  . Alcohol use: No  . Drug use: No  . Sexual activity: Not Currently    Birth control/protection: Post-menopausal  Lifestyle  . Physical activity    Days per week: 0 days    Minutes per session: Not on file  . Stress: Not on file  Relationships  . Social connections    Talks on phone: More than three times a week    Gets together: Not on file    Attends religious service: Not on file    Active member of club or organization: Not on file    Attends meetings of clubs or organizations: Not on file     Relationship status: Married  . Intimate partner violence    Fear of current or ex partner: No    Emotionally abused: No    Physically abused: No    Forced sexual activity: No  Other Topics Concern  . Not on file  Social History Narrative  . Not on file    Allergies: Allergies  Allergen Reactions  . Paclitaxel Shortness Of Breath    Chest tightening with second round chemo  . Penicillins     Current Medications: Current Outpatient Medications  Medication Sig Dispense Refill  . apixaban (ELIQUIS) 2.5 MG TABS tablet Take 1 tablet (2.5 mg total) by mouth 2 (two) times daily. 28 tablet 0   No current facility-administered medications for this visit.    Review of Systems General:  no complaints Skin: no complaints Eyes: no complaints HEENT: no complaints Breasts: no complaints Pulmonary: no complaints Cardiac: no complaints Gastrointestinal: no complaints Genitourinary/Sexual: no complaints Ob/Gyn: no complaints Musculoskeletal: right hip immobility and pain Hematology: no complaints Neurologic/Psych: no complaints   Objective:  Physical Examination:  Today's Vitals   09/11/19 1327  BP: (!) 146/104  Pulse: 90  Resp: 20  Temp: (!) 97.2 F (36.2 C)  TempSrc: Tympanic  Weight: 223 lb 3.2 oz (101.2 kg)  Height: 5' 8"  (1.727 m)  PainSc: 0-No pain   Body mass index is 33.94 kg/m.   ECOG Performance Status: 0 - Asymptomatic  GENERAL: Patient is a well appearing female in no acute distress HEENT:  Sclera clear. Anicteric NODES:  Negative axillary, supraclavicular, inguinal lymph node survery LUNGS:  Clear to auscultation bilaterally.   HEART:  Regular rate and rhythm.  ABDOMEN:  Soft, nontender.  No hernias, incisions well healed. No masses or ascites EXTREMITIES:  No peripheral edema. Atraumatic. No cyanosis. Reduced mobility in left hip. SKIN:  Clear with no obvious rashes or skin changes.  NEURO:  Nonfocal. Well oriented.  Appropriate affect.  Pelvis  exam EGBUS/Vag: normal. Bimanual exam/RV: no masses or nodularity   Assessment:  MYNDI WAMBLE is a 58 y.o. female diagnosed with Stage IIIA grade 3 endometrioid endometrial adenocarcinoma 8/19 on endometrial biopsy.  She had pleural effusions that required multiple thoracenteses and were suspicious for cancer.  Received 3 cycles of carbo/taxol chemotherapy and developed pulmonary emboli and these were treated with Lovonox and IVC filter.  Then underwent TAH, BSO, omentectomy 10/29/18 for removal of uterus and very large right ovarian metastasis that was adherent to the pelvic structures. No  evidence of carcinomatosis. Parts of the mass that were adherent to the pelvic peritoneum after it was removed had cancer.  Now s/p 3 additional cycles of carboplatin/taxol.  CT scan C/A/P 2/20 shows area of soft tissue asymmetry above left vaginal cuff, but I did not feel anything on pelvic exam.  External pelvic radiation and vaginal brachytherapy 2/20.  Repeat CT scan A/P 4/20 and 9/20 stable.  No change in vaginal cuff area. She feels well and has no complaints. CA125 stable.  NED    Continues on Eliquis for PEs diagnosed 9/19 and IVC filter removed 2/20.  Genetic Testing- Microsatellite stable, TMB low (3). Foundation One PD-L1 - TPS 0%, PTEN, TP53, PIK3CA mutations.  Germline genetic testing negative in 11/19. In vitae panel showed possible significant variant in NF1. Genetic counselor discussed significance with patient.   Plan:   Problem List Items Addressed This Visit      Genitourinary   Endometrial adenocarcinoma ([place]) - Primary   Relevant Orders   CA 125      We will see her in clinic for exam in 6 months or sooner if concerning symptoms arise.  Will check CA125 as well.   She will see Dr Tasia Catchings in 3 months for continued management of her IV port and anticoagulation.  In the meantime she is sheltering in place and taking all precautions to avoid COVID infection.     Considering hip  replacement for arthritis and do not think there are any contraindications other than DVT 8 months ago.  She continues on Eliquis and still has IV port a cath.  Would leave this in for at least a year.   Mammogram will be ordered. And suggested she arrange for colonoscopy screening through PCP.  I personally interviewed and examined the patient. Agreed with the above/below plan of care. Patient/family questions were answered.   Mellody Drown, MD   CC:  Brita Romp [address], [address],  [place] [address] [phone]

## 2019-09-11 NOTE — Telephone Encounter (Signed)
Patient sent an e-mail requesting a 14 day supply of her Eliquis sent to [facility] in [place] due to delay in mail order.  Approved with MD and I will send rx.

## 2019-09-12 ENCOUNTER — Telehealth: Payer: Self-pay

## 2019-09-12 LAB — CA 125: Cancer Antigen (CA) 125: 6.4 U/mL (ref 0.0–38.1)

## 2019-09-12 NOTE — Telephone Encounter (Signed)
-----   Message from Virginia Crews, MD sent at 09/11/2019  3:39 PM EDT ----- This patient is well overdue for CPE and multiple cancer screenings.  Oncology contacted me to coordinate this.  Can we please get her fit in somewhere for a CPE so we can get these ordered. ----- Message ----- From: Mellody Drown, MD Sent: 09/11/2019   2:24 PM EDT To: Virginia Crews, MD

## 2019-09-12 NOTE — Telephone Encounter (Signed)
LMTCB needs a CPE scheduled ASAP. Work in is ok per Dr. Brita Romp.

## 2019-09-17 NOTE — Telephone Encounter (Signed)
Patient scheduled for 10/07/2019

## 2019-10-07 ENCOUNTER — Ambulatory Visit (INDEPENDENT_AMBULATORY_CARE_PROVIDER_SITE_OTHER): Payer: 59 | Admitting: Family Medicine

## 2019-10-07 ENCOUNTER — Other Ambulatory Visit: Payer: Self-pay

## 2019-10-07 ENCOUNTER — Encounter: Payer: Self-pay | Admitting: Family Medicine

## 2019-10-07 VITALS — BP 127/83 | HR 91 | Temp 97.1°F | Ht 68.0 in | Wt 231.8 lb

## 2019-10-07 DIAGNOSIS — Z1231 Encounter for screening mammogram for malignant neoplasm of breast: Secondary | ICD-10-CM

## 2019-10-07 DIAGNOSIS — I872 Venous insufficiency (chronic) (peripheral): Secondary | ICD-10-CM

## 2019-10-07 DIAGNOSIS — Z1211 Encounter for screening for malignant neoplasm of colon: Secondary | ICD-10-CM

## 2019-10-07 DIAGNOSIS — Z0000 Encounter for general adult medical examination without abnormal findings: Secondary | ICD-10-CM

## 2019-10-07 DIAGNOSIS — Z1159 Encounter for screening for other viral diseases: Secondary | ICD-10-CM

## 2019-10-07 DIAGNOSIS — Z114 Encounter for screening for human immunodeficiency virus [HIV]: Secondary | ICD-10-CM

## 2019-10-07 DIAGNOSIS — Z6835 Body mass index (BMI) 35.0-35.9, adult: Secondary | ICD-10-CM

## 2019-10-07 DIAGNOSIS — C541 Malignant neoplasm of endometrium: Secondary | ICD-10-CM

## 2019-10-07 DIAGNOSIS — E669 Obesity, unspecified: Secondary | ICD-10-CM

## 2019-10-07 DIAGNOSIS — I2699 Other pulmonary embolism without acute cor pulmonale: Secondary | ICD-10-CM

## 2019-10-07 DIAGNOSIS — I825Y3 Chronic embolism and thrombosis of unspecified deep veins of proximal lower extremity, bilateral: Secondary | ICD-10-CM

## 2019-10-07 NOTE — Patient Instructions (Signed)

## 2019-10-07 NOTE — Progress Notes (Signed)
Patient: Karen Mann, Female    DOB: 1961-08-21, 58 y.o.   MRN: [id number] Visit Date: 10/07/2019  Today's Provider: Lavon Paganini, MD   Chief Complaint  Patient presents with  . Annual Exam   Subjective:  I, Tiburcio Pea, CMA, am acting as a scribe for Lavon Paganini, MD.    Annual physical exam Karen Mann is a 58 y.o. female who presents today for health maintenance and complete physical. She feels well. She reports exercising none. She reports she is sleeping poorly.  ----------------------------------------------------------------- Last pap:01/01/2016 - s/p hysterectomy Last mammogram:12/16/2009 Has never had colon cancer screening  Review of Systems  Constitutional: Negative.   HENT: Negative.   Eyes: Negative.   Respiratory: Negative.   Cardiovascular: Positive for leg swelling.  Gastrointestinal: Negative.   Endocrine: Negative.   Genitourinary: Negative.   Musculoskeletal: Positive for arthralgias, back pain and gait problem.  Skin: Negative.   Allergic/Immunologic: Negative.   Hematological: Negative.   Psychiatric/Behavioral: Positive for sleep disturbance.    Social History She  reports that she has never smoked. She has never used smokeless tobacco. She reports that she does not drink alcohol or use drugs. Social History   Socioeconomic History  . Marital status: Married    Spouse name: Heath Lark   . Number of children: 1  . Years of education: Not on file  . Highest education level: Not on file  Occupational History  . Not on file  Social Needs  . Financial resource strain: Not on file  . Food insecurity    Worry: Never true    Inability: Never true  . Transportation needs    Medical: No    Non-medical: No  Tobacco Use  . Smoking status: Never Smoker  . Smokeless tobacco: Never Used  Substance and Sexual Activity  . Alcohol use: No  . Drug use: No  . Sexual activity: Not Currently    Birth control/protection:  Post-menopausal  Lifestyle  . Physical activity    Days per week: 0 days    Minutes per session: Not on file  . Stress: Not on file  Relationships  . Social connections    Talks on phone: More than three times a week    Gets together: Not on file    Attends religious service: Not on file    Active member of club or organization: Not on file    Attends meetings of clubs or organizations: Not on file    Relationship status: Married  Other Topics Concern  . Not on file  Social History Narrative  . Not on file    Patient Active Problem List   Diagnosis Date Noted  . Chronic anticoagulation 01/02/2019  . Encounter for antineoplastic chemotherapy 01/02/2019  . Neuropathy due to chemotherapeutic drug ([place]) 01/02/2019  . S/P insertion of IVC (inferior vena caval) filter 09/26/2018  . Bilateral pulmonary embolism ([place]) 09/22/2018  . DVT (deep venous thrombosis) ([place]) 09/22/2018  . Anxiety associated with cancer diagnosis 09/07/2018  . Recurrent pleural effusion on right 09/07/2018  . Lower extremity edema 09/07/2018  . Chemotherapy-induced peripheral neuropathy ([place]) 09/07/2018  . Iron deficiency anemia due to chronic blood loss 08/10/2018  . Goals of care, counseling/discussion 08/10/2018  . Endometrial adenocarcinoma ([place]) 08/10/2018  . Pelvic mass 08/08/2018  . Abnormal uterine bleeding (AUB) 07/18/2018  . Generalized abdominal pain 07/18/2018  . Chest pain 12/03/2015  . Breathlessness on exertion 12/03/2015  . Arthritis 12/01/2015  . Secondary pulmonary hypertension 11/24/2015  .  Allergic rhinitis 03/02/2010  . Varicose veins of lower extremities with inflammation 01/06/2010  . Chronic venous insufficiency 01/06/2010  . Nonrheumatic tricuspid valve disorder 12/30/2009  . Cyst of ovary 12/09/2009  . Fibroid 12/09/2009  . Family history of malignant neoplasm of ovary 12/02/2009    Past Surgical History:  Procedure Laterality Date  . EYE SURGERY  2000   Lasik  . IVC  FILTER REMOVAL Right 02/07/2019   Procedure: IVC FILTER REMOVAL;  Surgeon: Algernon Huxley, MD;  Location: [facility];  Service: Cardiovascular;  Laterality: Right;  . PORTA CATH INSERTION N/A 08/13/2018   Procedure: PORTA CATH INSERTION;  Surgeon: Algernon Huxley, MD;  Location: [facility];  Service: Cardiovascular;  Laterality: N/A;  . Removal of IVC filter   02/07/2019   chemo completed     Family History  Family Status  Relation Name Status  . Father  Alive  . Mother  Deceased at age 71       ovarian cancer  . Brother 71 Alive  . Mat Aunt 73 Alive  . MGM  Deceased  . MGF  Deceased  . PGM  Deceased  . PGF  Deceased  . Son 58 Alive  . Other  Deceased   Her family history includes Cancer in her paternal grandfather; Healthy in her brother; Hypertension in her father; Ovarian cancer in an other family member; Ovarian cancer (age of onset: 39) in her mother; Skin cancer in her paternal grandmother.     Allergies  Allergen Reactions  . Paclitaxel Shortness Of Breath    Chest tightening with second round chemo  . Penicillins     Previous Medications   APIXABAN (ELIQUIS) 2.5 MG TABS TABLET    Take 1 tablet (2.5 mg total) by mouth 2 (two) times daily.    Patient Care Team: Virginia Crews, MD as PCP - General (Family Medicine) Clent Jacks, RN as Registered Nurse      Objective:   Vitals: BP 127/83 (BP Location: Right Arm, Patient Position: Sitting, Cuff Size: Large)   Pulse 91   Temp (!) 97.1 F (36.2 C) (Temporal)   Ht 5\' 8"  (1.727 m)   Wt 231 lb 12.8 oz (105.1 kg)   LMP 08/13/2018 Comment: patient continues to bleed  SpO2 96%   BMI 35.25 kg/m    Physical Exam Vitals signs reviewed.  Constitutional:      General: She is not in acute distress.    Appearance: Normal appearance. She is well-developed. She is not diaphoretic.  HENT:     Head: Normocephalic and atraumatic.     Right Ear: Tympanic membrane, ear canal and external ear normal.      Left Ear: Tympanic membrane, ear canal and external ear normal.  Eyes:     General: No scleral icterus.    Conjunctiva/sclera: Conjunctivae normal.     Pupils: Pupils are equal, round, and reactive to light.  Neck:     Musculoskeletal: Neck supple.     Thyroid: No thyromegaly.  Cardiovascular:     Rate and Rhythm: Normal rate and regular rhythm.     Pulses: Normal pulses.     Heart sounds: Normal heart sounds. No murmur.  Pulmonary:     Effort: Pulmonary effort is normal. No respiratory distress.     Breath sounds: Normal breath sounds. No wheezing or rales.  Abdominal:     General: There is no distension.     Palpations: Abdomen is soft.  Tenderness: There is no abdominal tenderness.  Musculoskeletal:        General: No deformity.     Right lower leg: Edema (trace) present.     Left lower leg: Edema (trace) present.  Lymphadenopathy:     Cervical: No cervical adenopathy.  Skin:    General: Skin is warm and dry.     Capillary Refill: Capillary refill takes less than 2 seconds.     Findings: No rash.  Neurological:     Mental Status: She is alert and oriented to person, place, and time. Mental status is at baseline.  Psychiatric:        Mood and Affect: Mood normal.        Behavior: Behavior normal.        Thought Content: Thought content normal.      Depression Screen PHQ 2/9 Scores 10/07/2019 04/18/2019 12/31/2015  PHQ - 2 Score 0 0 0  PHQ- 9 Score 3 - -      Assessment & Plan:     Routine Health Maintenance and Physical Exam  Exercise Activities and Dietary recommendations Goals    . Exercise 150 minutes per week (moderate activity)        There is no immunization history on file for this patient.  Health Maintenance  Topic Date Due  . Hepatitis C Screening  [birth year]  . HIV Screening  07/01/1977  . TETANUS/TDAP  07/01/1981  . MAMMOGRAM  07/01/2012  . COLONOSCOPY  07/01/2012  . INFLUENZA VACCINE  03/25/2020 (Originally 07/27/2019)      Discussed health benefits of physical activity, and encouraged her to engage in regular exercise appropriate for her age and condition.    --------------------------------------------------------------------  Problem List Items Addressed This Visit      Cardiovascular and Mediastinum   Bilateral pulmonary embolism ([place]) (Chronic)    On eliquis per Hem/Onc      DVT (deep venous thrombosis) ([facility]) (Chronic)    On Eliquis per Hem/Onc      Chronic venous insufficiency    Minimal swelling today Advised elevation and compression stockings        Genitourinary   Endometrial adenocarcinoma ([place])    S/p chemo and hysterectomy No further pap smears indicated       Other Visit Diagnoses    Encounter for annual physical exam    -  Primary   Relevant Orders   Ambulatory referral to Gastroenterology   MM 3D SCREEN BREAST BILATERAL   Hepatitis C Antibody   Lipid panel   TSH   HIV antibody (with reflex)   Encounter for screening mammogram for malignant neoplasm of breast       Relevant Orders   MM 3D SCREEN BREAST BILATERAL   Screen for colon cancer       Relevant Orders   Ambulatory referral to Gastroenterology   Need for hepatitis C screening test       Relevant Orders   Hepatitis C Antibody   Class 2 obesity without serious comorbidity with body mass index (BMI) of 35.0 to 35.9 in adult, unspecified obesity type       Relevant Orders   Lipid panel   TSH   Screening for HIV (human immunodeficiency virus)       Relevant Orders   HIV antibody (with reflex)       Return in about 1 year (around 10/06/2020) for CPE.   The entirety of the information documented in the History of Present Illness, Review of Systems and Physical  Exam were personally obtained by me. Portions of this information were initially documented by Tiburcio Pea, CMA and reviewed by me for thoroughness and accuracy.    Dorse Locy, Dionne Bucy, MD MPH [facility]

## 2019-10-07 NOTE — Assessment & Plan Note (Signed)
On Eliquis per Hem/Onc

## 2019-10-07 NOTE — Assessment & Plan Note (Signed)
S/p chemo and hysterectomy No further pap smears indicated

## 2019-10-07 NOTE — Assessment & Plan Note (Signed)
On eliquis per Hem/Onc

## 2019-10-07 NOTE — Assessment & Plan Note (Signed)
Minimal swelling today Advised elevation and compression stockings

## 2019-10-08 LAB — LIPID PANEL
Chol/HDL Ratio: 3.3 ratio (ref 0.0–4.4)
Cholesterol, Total: 219 mg/dL — ABNORMAL HIGH (ref 100–199)
HDL: 67 mg/dL (ref >39)
LDL Chol Calc (NIH): 113 mg/dL — ABNORMAL HIGH (ref 0–99)
Triglycerides: 225 mg/dL — ABNORMAL HIGH (ref 0–149)
VLDL Cholesterol Cal: 39 mg/dL (ref 5–40)

## 2019-10-08 LAB — HEPATITIS C ANTIBODY: Hep C Virus Ab: <0.1 s/co ratio (ref 0.0–0.9)

## 2019-10-08 LAB — HIV ANTIBODY (ROUTINE TESTING W REFLEX): HIV Screen 4th Generation wRfx: NONREACTIVE

## 2019-10-08 LAB — TSH: TSH: 1.14 u[IU]/mL (ref 0.450–4.500)

## 2019-10-09 ENCOUNTER — Telehealth: Payer: Self-pay

## 2019-10-09 NOTE — Telephone Encounter (Signed)
Patient advised as below.  

## 2019-10-09 NOTE — Telephone Encounter (Signed)
-----   Message from Virginia Crews, MD sent at 10/08/2019  7:15 PM EDT ----- Normal Thyroid function.  Negative HIV and Hep C screenings.  Cholesterol is elevated, but no need for medication at this time.  I recommend diet low in saturated fat and regular exercise - 30 min at least 5 times per week

## 2019-10-15 ENCOUNTER — Telehealth: Payer: Self-pay

## 2019-10-15 NOTE — Telephone Encounter (Signed)
Returned patients call-LVM for her to call office to schedule her colonoscopy.  Thanks [employer]

## 2019-10-18 ENCOUNTER — Encounter: Payer: Self-pay | Admitting: *Deleted

## 2019-10-21 ENCOUNTER — Telehealth: Payer: Self-pay | Admitting: Gastroenterology

## 2019-10-21 ENCOUNTER — Other Ambulatory Visit: Payer: Self-pay

## 2019-10-21 DIAGNOSIS — Z1211 Encounter for screening for malignant neoplasm of colon: Secondary | ICD-10-CM

## 2019-10-21 MED ORDER — NA SULFATE-K SULFATE-MG SULF 17.5-3.13-1.6 GM/177ML PO SOLN: 1.0000 | Freq: Once | ORAL | 0 refills | Status: AC

## 2019-10-21 NOTE — Telephone Encounter (Signed)
Pt left vm to schedule a colonscopy

## 2019-10-21 NOTE — Telephone Encounter (Signed)
Gastroenterology Pre-Procedure Review  Request Date: Thursday 10/31/19 Requesting Physician: Dr. Marius Ditch  PATIENT REVIEW QUESTIONS: The patient responded to the following health history questions as indicated:    1. Are you having any GI issues? no 2. Do you have a personal history of Polyps? no 3. Do you have a family history of Colon Cancer or Polyps? no 4. Diabetes Mellitus? no 5. Joint replacements in the past 12 months?no 6. Major health problems in the past 3 months?Uterine Cancer Surgery Novermber 4th 2019 7. Any artificial heart valves, MVP, or defibrillator?no    MEDICATIONS & ALLERGIES:    Patient reports the following regarding taking any anticoagulation/antiplatelet therapy:   Plavix, Coumadin, Eliquis, Xarelto, Lovenox, Pradaxa, Brilinta, or Effient? yes (Eliquis Blood Thinner Request sent to Dr. Tasia Catchings.) Aspirin? no  Patient confirms/reports the following medications:  Current Outpatient Medications  Medication Sig Dispense Refill  . apixaban (ELIQUIS) 2.5 MG TABS tablet Take 1 tablet (2.5 mg total) by mouth 2 (two) times daily. 28 tablet 0   No current facility-administered medications for this visit.     Patient confirms/reports the following allergies:  Allergies  Allergen Reactions  . Paclitaxel Shortness Of Breath    Chest tightening with second round chemo  . Penicillins     No orders of the defined types were placed in this encounter.   AUTHORIZATION INFORMATION Primary Insurance: 1D#: Group #:  Secondary Insurance: 1D#: Group #:  SCHEDULE INFORMATION: Date: 10/31/19 Time: Location:[facility]

## 2019-10-21 NOTE — Progress Notes (Signed)
Gastroenterology Pre-Procedure Review  Request Date: 10/31/19 Requesting Physician: Dr. Adolphus Birchwood  PATIENT REVIEW QUESTIONS: The patient responded to the following health history questions as indicated:    1. Are you having any GI issues? no 2. Do you have a personal history of Polyps? no 3. Do you have a family history of Colon Cancer or Polyps? no 4. Diabetes Mellitus? no 5. Joint replacements in the past 12 months?November 4th Uterine CA Surgery 6. Major health problems in the past 3 months?November 4th 2019 Uterine CA surgery 7. Any artificial heart valves, MVP, or defibrillator?no    MEDICATIONS & ALLERGIES:    Patient reports the following regarding taking any anticoagulation/antiplatelet therapy:   Plavix, Coumadin, Eliquis, Xarelto, Lovenox, Pradaxa, Brilinta, or Effient? Eliquis Blood Thinner sent to Dr. Tasia Catchings Aspirin? no  Patient confirms/reports the following medications:  Current Outpatient Medications  Medication Sig Dispense Refill  . apixaban (ELIQUIS) 2.5 MG TABS tablet Take 1 tablet (2.5 mg total) by mouth 2 (two) times daily. 28 tablet 0  . Na Sulfate-K Sulfate-Mg Sulf 17.5-3.13-1.6 GM/177ML SOLN Take 1 kit by mouth once for 1 dose. 354 mL 0   No current facility-administered medications for this visit.     Patient confirms/reports the following allergies:  Allergies  Allergen Reactions  . Paclitaxel Shortness Of Breath    Chest tightening with second round chemo  . Penicillins     No orders of the defined types were placed in this encounter.   AUTHORIZATION INFORMATION Primary Insurance: 1D#: Group #:  Secondary Insurance: 1D#: Group #:  SCHEDULE INFORMATION: Date: 10/31/19 Time: Location:[facility]

## 2019-10-22 ENCOUNTER — Telehealth: Payer: Self-pay

## 2019-10-22 NOTE — Telephone Encounter (Signed)
Blood Thinner Advice received from Dr. Tasia Catchings.  LVM advising patient to stop Eliquis 3 days prior to procedure and restart 1 day after.  Asked her to call office if she has any questions regarding this advice.  Thanks [employer]

## 2019-10-23 ENCOUNTER — Encounter (INDEPENDENT_AMBULATORY_CARE_PROVIDER_SITE_OTHER): Payer: Self-pay

## 2019-10-23 ENCOUNTER — Other Ambulatory Visit: Payer: Self-pay

## 2019-10-23 ENCOUNTER — Inpatient Hospital Stay: Payer: 59 | Attending: Oncology

## 2019-10-23 DIAGNOSIS — C541 Malignant neoplasm of endometrium: Secondary | ICD-10-CM | POA: Diagnosis present

## 2019-10-23 DIAGNOSIS — Z452 Encounter for adjustment and management of vascular access device: Secondary | ICD-10-CM | POA: Insufficient documentation

## 2019-10-23 DIAGNOSIS — Z95828 Presence of other vascular implants and grafts: Secondary | ICD-10-CM

## 2019-10-23 MED ORDER — HEPARIN SOD (PORK) LOCK FLUSH 100 UNIT/ML IV SOLN
500.0000 [IU] | Freq: Once | INTRAVENOUS | Status: AC
  Administered 2019-10-23: 12:00:00 500 [IU] via INTRAVENOUS

## 2019-10-23 MED ORDER — SODIUM CHLORIDE 0.9% FLUSH
10.0000 mL | Freq: Once | INTRAVENOUS | Status: AC
  Administered 2019-10-23: 10 mL via INTRAVENOUS
  Filled 2019-10-23: qty 10

## 2019-10-25 ENCOUNTER — Telehealth: Payer: Self-pay

## 2019-10-25 NOTE — Telephone Encounter (Signed)
Called patient to confirm patient procedure appointment and to remind patient that they have to go for a COVID test on 11/02/202.  Called and left a detail message sent mychart message

## 2019-10-28 ENCOUNTER — Other Ambulatory Visit: Admission: RE | Admit: 2019-10-28 | Payer: 59 | Source: Ambulatory Visit

## 2019-10-28 ENCOUNTER — Telehealth: Payer: Self-pay

## 2019-10-28 NOTE — Telephone Encounter (Signed)
Called patient to find out if patient went for COVID test this morning. Patient states she did not because michelle told her that her COVID test she had done at work on Thursday 10/24/2019 but she needed to fax it to our office and endo. Informed patient that I do not see that it has been done yet. Patient states she will be in the office tomorrow and will make sure it gets faxed to [place].

## 2019-10-30 ENCOUNTER — Telehealth: Payer: Self-pay

## 2019-10-30 NOTE — Telephone Encounter (Signed)
We did not received patient COVID test.Trish in ENDO said she received half of the results but needs patient to get [place] all of the results. Tried to call patient but voicemail is full

## 2019-10-31 ENCOUNTER — Encounter
Admission: RE | Disposition: A | Discharge status: Home / Self Care | Payer: Self-pay | Source: Home / Self Care | Attending: Gastroenterology

## 2019-10-31 ENCOUNTER — Other Ambulatory Visit: Payer: Self-pay

## 2019-10-31 ENCOUNTER — Encounter: Payer: Self-pay | Admitting: *Deleted

## 2019-10-31 ENCOUNTER — Ambulatory Visit
Admission: RE | Admit: 2019-10-31 | Discharge: 2019-10-31 | Disposition: A | Discharge status: Home / Self Care | Payer: 59 | Attending: Gastroenterology | Admitting: Gastroenterology

## 2019-10-31 ENCOUNTER — Ambulatory Visit: Payer: 59 | Admitting: Anesthesiology

## 2019-10-31 DIAGNOSIS — Z7901 Long term (current) use of anticoagulants: Secondary | ICD-10-CM | POA: Insufficient documentation

## 2019-10-31 DIAGNOSIS — Z86718 Personal history of other venous thrombosis and embolism: Secondary | ICD-10-CM | POA: Insufficient documentation

## 2019-10-31 DIAGNOSIS — Z1211 Encounter for screening for malignant neoplasm of colon: Secondary | ICD-10-CM | POA: Diagnosis not present

## 2019-10-31 DIAGNOSIS — Z8542 Personal history of malignant neoplasm of other parts of uterus: Secondary | ICD-10-CM | POA: Diagnosis not present

## 2019-10-31 DIAGNOSIS — K5730 Diverticulosis of large intestine without perforation or abscess without bleeding: Secondary | ICD-10-CM | POA: Diagnosis not present

## 2019-10-31 DIAGNOSIS — Z86711 Personal history of pulmonary embolism: Secondary | ICD-10-CM | POA: Diagnosis not present

## 2019-10-31 HISTORY — PX: COLONOSCOPY WITH PROPOFOL: SHX5780

## 2019-10-31 SURGERY — COLONOSCOPY WITH PROPOFOL
Anesthesia: General

## 2019-10-31 MED ORDER — SODIUM CHLORIDE 0.9 % IV SOLN
INTRAVENOUS | Status: DC
  Administered 2019-10-31: 1000 mL via INTRAVENOUS

## 2019-10-31 MED ORDER — PROPOFOL 500 MG/50ML IV EMUL
INTRAVENOUS | Status: DC | PRN
  Administered 2019-10-31: 125 ug/kg/min via INTRAVENOUS

## 2019-10-31 MED ORDER — PROPOFOL 10 MG/ML IV BOLUS
INTRAVENOUS | Status: DC | PRN
  Administered 2019-10-31: 10 mg via INTRAVENOUS
  Administered 2019-10-31: 20 mg via INTRAVENOUS
  Administered 2019-10-31: 50 mg via INTRAVENOUS

## 2019-10-31 MED ORDER — LIDOCAINE HCL (CARDIAC) PF 100 MG/5ML IV SOSY
PREFILLED_SYRINGE | INTRAVENOUS | Status: DC | PRN
  Administered 2019-10-31: 60 mg via INTRAVENOUS

## 2019-10-31 NOTE — Anesthesia Postprocedure Evaluation (Deleted)
Anesthesia Post Note  Patient: Andreika T Boberg  Procedure(s) Performed: COLONOSCOPY WITH PROPOFOL (N/A )  Patient location during evaluation: PACU Anesthesia Type: General Level of consciousness: awake and alert Pain management: pain level controlled Vital Signs Assessment: post-procedure vital signs reviewed and stable Respiratory status: spontaneous breathing, nonlabored ventilation, respiratory function stable and patient connected to nasal cannula oxygen Cardiovascular status: blood pressure returned to baseline and stable Postop Assessment: no apparent nausea or vomiting Anesthetic complications: no     Last Vitals:  Vitals:   10/31/19 1037 10/31/19 1047  BP: 98/68 (!) 113/91  Pulse: 77 73  Resp: 13 14  Temp:    SpO2: 100% 100%    Last Pain:  Vitals:   10/31/19 1047  TempSrc:   PainSc: 0-No pain                 Martha Clan

## 2019-10-31 NOTE — Anesthesia Preprocedure Evaluation (Signed)
Anesthesia Evaluation  Patient identified by MRN, date of birth, ID band Patient awake    Reviewed: Allergy & Precautions, H&P , NPO status , Patient's Chart, lab work & pertinent test results, reviewed documented beta blocker date and time   History of Anesthesia Complications Negative for: history of anesthetic complications  Airway Mallampati: I  TM Distance: >3 FB Neck ROM: full    Dental  (+) Dental Advidsory Given   Pulmonary neg shortness of breath, neg COPD, neg recent URI,  Pulmonary embolus in September   Pulmonary exam normal        Cardiovascular Exercise Tolerance: Good (-) hypertension(-) angina+ DVT  (-) CAD, (-) Past MI and (-) Cardiac Stents Normal cardiovascular exam(-) dysrhythmias (-) Valvular Problems/Murmurs     Neuro/Psych neg Seizures PSYCHIATRIC DISORDERS Anxiety  Neuromuscular disease (peripheral neuropathy)    GI/Hepatic negative GI ROS, Neg liver ROS,   Endo/Other  negative endocrine ROS  Renal/GU negative Renal ROS  negative genitourinary   Musculoskeletal   Abdominal   Peds  Hematology negative hematology ROS (+)   Anesthesia Other Findings Past Medical History: No date: Allergy 07/20/2018: Anemia 07/2018: Endometrial adenocarcinoma ([place]) 07/24/2018: Fibroid uterus     Comment:  5cm/2 inch  07/24/2018: Ovarian mass     Comment:  22cm/10 inch No date: Pelvic mass   Reproductive/Obstetrics negative OB ROS                             Anesthesia Physical Anesthesia Plan  ASA: II  Anesthesia Plan: General   Post-op Pain Management:    Induction: Intravenous  PONV Risk Score and Plan: 3 and Propofol infusion and TIVA  Airway Management Planned: Natural Airway and Nasal Cannula  Additional Equipment:   Intra-op Plan:   Post-operative Plan:   Informed Consent: I have reviewed the patients History and Physical, chart, labs and discussed the  procedure including the risks, benefits and alternatives for the proposed anesthesia with the patient or authorized representative who has indicated his/her understanding and acceptance.     Dental Advisory Given  Plan Discussed with: Anesthesiologist, CRNA and Surgeon  Anesthesia Plan Comments:         Anesthesia Quick Evaluation

## 2019-10-31 NOTE — H&P (Signed)
Vonda Antigua, MD [address], [place], [place], [place], [address] [address], [place], [place], [place], [address] Phone: [phone]  Fax: [phone]  Primary Care Physician:  Virginia Crews, MD   Pre-Procedure History & Physical: HPI:  Karen Mann is a 58 y.o. female is here for a colonoscopy.   Past Medical History:  Diagnosis Date  . Allergy   . Anemia 07/20/2018  . Endometrial adenocarcinoma ([place]) 07/2018  . Fibroid uterus 07/24/2018   5cm/2 inch   . Ovarian mass 07/24/2018   22cm/10 inch  . Pelvic mass     Past Surgical History:  Procedure Laterality Date  . ABDOMINAL HYSTERECTOMY  10/2018  . EYE SURGERY  2000   Lasik  . IVC FILTER REMOVAL Right 02/07/2019   Procedure: IVC FILTER REMOVAL;  Surgeon: Algernon Huxley, MD;  Location: [facility];  Service: Cardiovascular;  Laterality: Right;  . PORTA CATH INSERTION N/A 08/13/2018   Procedure: PORTA CATH INSERTION;  Surgeon: Algernon Huxley, MD;  Location: [facility];  Service: Cardiovascular;  Laterality: N/A;  . Removal of IVC filter   02/07/2019   chemo completed     Prior to Admission medications   Medication Sig Start Date End Date Taking? Authorizing Provider  apixaban (ELIQUIS) 2.5 MG TABS tablet Take 1 tablet (2.5 mg total) by mouth 2 (two) times daily. 09/11/19   Earlie Server, MD    Allergies as of 10/21/2019 - Review Complete 10/07/2019  Allergen Reaction Noted  . Paclitaxel Shortness Of Breath 09/21/2018  . Penicillins  11/24/2015    Family History  Problem Relation Age of Onset  . Hypertension Father   . Ovarian cancer Mother 105       deceased 45  . Healthy Brother   . Skin cancer Paternal Grandmother        deceased 37s  . Cancer Paternal Grandfather        unk. primary; deceased 20s  . Ovarian cancer Other        mat grandmother's sister    Social History   Socioeconomic History  . Marital status: Married    Spouse name: Heath Lark   . Number of children: 1   . Years of education: Not on file  . Highest education level: Not on file  Occupational History  . Not on file  Social Needs  . Financial resource strain: Not on file  . Food insecurity    Worry: Never true    Inability: Never true  . Transportation needs    Medical: No    Non-medical: No  Tobacco Use  . Smoking status: Never Smoker  . Smokeless tobacco: Never Used  Substance and Sexual Activity  . Alcohol use: No  . Drug use: No  . Sexual activity: Not Currently    Birth control/protection: Post-menopausal  Lifestyle  . Physical activity    Days per week: 0 days    Minutes per session: Not on file  . Stress: Not on file  Relationships  . Social connections    Talks on phone: More than three times a week    Gets together: Not on file    Attends religious service: Not on file    Active member of club or organization: Not on file    Attends meetings of clubs or organizations: Not on file    Relationship status: Married  . Intimate partner violence    Fear of current or ex partner: No  Emotionally abused: No    Physically abused: No    Forced sexual activity: No  Other Topics Concern  . Not on file  Social History Narrative  . Not on file    Review of Systems: See HPI, otherwise negative ROS  Physical Exam: LMP 08/13/2018 Comment: patient continues to bleed General:   Alert,  pleasant and cooperative in NAD Head:  Normocephalic and atraumatic. Neck:  Supple; no masses or thyromegaly. Lungs:  Clear throughout to auscultation, normal respiratory effort.    Heart:  +S1, +S2, Regular rate and rhythm, No edema. Abdomen:  Soft, nontender and nondistended. Normal bowel sounds, without guarding, and without rebound.   Neurologic:  Alert and  oriented x4;  grossly normal neurologically.  Impression/Plan: Karen Mann is here for a colonoscopy to be performed for average risk screening.  Risks, benefits, limitations, and alternatives regarding  colonoscopy have  been reviewed with the patient.  Questions have been answered.  All parties agreeable.   Virgel Manifold, MD  10/31/2019, 9:31 AM

## 2019-10-31 NOTE — Op Note (Signed)
[facility] Gastroenterology Patient Name: Karen Mann Procedure Date: 10/31/2019 9:54 AM MRN: [id number] Account #: [id number] Date of Birth: 30-Nov-1961 Admit Type: Outpatient Age: 58 Room: [facility] ENDO ROOM 3 Gender: Female Note Status: Finalized Procedure:             Colonoscopy Indications:           Screening for colorectal malignant neoplasm Providers:             Kengo Sturges B. Bonna Gains MD, MD Referring MD:          Dionne Bucy. Bacigalupo (Referring MD) Medicines:             Monitored Anesthesia Care Complications:         No immediate complications. Procedure:             Pre-Anesthesia Assessment:                        - Prior to the procedure, a History and Physical was                         performed, and patient medications, allergies and                         sensitivities were reviewed. The patient's tolerance                         of previous anesthesia was reviewed.                        - The risks and benefits of the procedure and the                         sedation options and risks were discussed with the                         patient. All questions were answered and informed                         consent was obtained.                        - Patient identification and proposed procedure were                         verified prior to the procedure by the physician, the                         nurse, the anesthetist and the technician. The                         procedure was verified in the pre-procedure area in                         the procedure room in the endoscopy suite.                        - ASA Grade Assessment: II - A patient with mild  systemic disease.                        - After reviewing the risks and benefits, the patient                         was deemed in satisfactory condition to undergo the                         procedure.                        After obtaining informed consent, the  colonoscope was                         passed under direct vision. Throughout the procedure,                         the patient's blood pressure, pulse, and oxygen                         saturations were monitored continuously. The                         Colonoscope was introduced through the anus and                         advanced to the the cecum, identified by appendiceal                         orifice and ileocecal valve. The colonoscopy was                         performed with ease. The patient tolerated the                         procedure well. The quality of the bowel preparation                         was good. Findings:      The perianal and digital rectal examinations were normal.      A few diverticula were found in the colon.      The exam was otherwise without abnormality.      The rectum, sigmoid colon, descending colon, transverse colon, ascending       colon and cecum appeared normal.      The retroflexed view of the distal rectum and anal verge was normal and       showed no anal or rectal abnormalities. Impression:            - Diverticulosis.                        - The examination was otherwise normal.                        - The rectum, sigmoid colon, descending colon,                         transverse colon, ascending colon and cecum are normal.                        -  The distal rectum and anal verge are normal on                         retroflexion view.                        - No specimens collected. Recommendation:        - Discharge patient to home.                        - Resume previous diet.                        - Continue present medications.                        - Repeat colonoscopy in 10 years for screening                         purposes.                        - Return to primary care physician as previously                         scheduled.                        - The findings and recommendations were discussed with                          the patient.                        - The findings and recommendations were discussed with                         the patient's family.                        - High fiber diet.                        - In the future, if patient develops new symptoms such                         as blood per rectum, abdominal pain, weight loss,                         altered bowel habits or any other reason for concern,                         patient should discuss this with thier PCP as they may                         need a GI referral at that time or evaluation for need                         for colonoscopy earlier than the recommended screening  colonoscopy.                        In addition, if patient's family history of colon                         cancer changes (no family history at this time) in the                         future, earlier screening may be indicated and patient                         should discuss this with PCP as well. Procedure Code(s):     --- Professional ---                        [id number], Colorectal cancer screening; colonoscopy on                         individual not meeting criteria for high risk Diagnosis Code(s):     --- Professional ---                        Z12.11, Encounter for screening for malignant neoplasm                         of colon CPT copyright 2019 [employer] Association. All rights reserved. The codes documented in this report are preliminary and upon coder review may  be revised to meet current compliance requirements.  Vonda Antigua, MD Margretta Sidle B. Bonna Gains MD, MD 10/31/2019 10:30:36 AM This report has been signed electronically. Number of Addenda: 0 Note Initiated On: 10/31/2019 9:54 AM Scope Withdrawal Time: 0 hours 15 minutes 50 seconds  Total Procedure Duration: 0 hours 21 minutes 10 seconds       [facility]

## 2019-10-31 NOTE — Anesthesia Postprocedure Evaluation (Signed)
Anesthesia Post Note  Patient: Karen Mann  Procedure(s) Performed: COLONOSCOPY WITH PROPOFOL (N/A )  Patient location during evaluation: Endoscopy Anesthesia Type: General Level of consciousness: awake and alert Pain management: pain level controlled Vital Signs Assessment: post-procedure vital signs reviewed and stable Respiratory status: spontaneous breathing, nonlabored ventilation, respiratory function stable and patient connected to nasal cannula oxygen Cardiovascular status: blood pressure returned to baseline and stable Postop Assessment: no apparent nausea or vomiting Anesthetic complications: no     Last Vitals:  Vitals:   10/31/19 1037 10/31/19 1047  BP: 98/68 (!) 113/91  Pulse: 77 73  Resp: 13 14  Temp:    SpO2: 100% 100%    Last Pain:  Vitals:   10/31/19 1047  TempSrc:   PainSc: 0-No pain                 Martha Clan

## 2019-10-31 NOTE — Transfer of Care (Signed)
Immediate Anesthesia Transfer of Care Note  Patient: Karen Mann  Procedure(s) Performed: COLONOSCOPY WITH PROPOFOL (N/A )  Patient Location: PACU  Anesthesia Type:General  Level of Consciousness: awake, alert  and oriented  Airway & Oxygen Therapy: Patient Spontanous Breathing  Post-op Assessment: Report given to RN and Post -op Vital signs reviewed and stable  Post vital signs: Reviewed and stable  Last Vitals:  Vitals Value Taken Time  BP    Temp    Pulse    Resp    SpO2      Last Pain:  Vitals:   10/31/19 0932  TempSrc: Tympanic         Complications: No apparent anesthesia complications

## 2019-10-31 NOTE — Anesthesia Post-op Follow-up Note (Signed)
Anesthesia QCDR form completed.        

## 2019-11-01 ENCOUNTER — Encounter: Payer: Self-pay | Admitting: Gastroenterology

## 2019-11-10 ENCOUNTER — Other Ambulatory Visit: Payer: Self-pay | Admitting: Oncology

## 2019-12-04 ENCOUNTER — Other Ambulatory Visit: Payer: Self-pay

## 2019-12-04 ENCOUNTER — Inpatient Hospital Stay: Payer: 59 | Attending: Oncology

## 2019-12-04 DIAGNOSIS — Z452 Encounter for adjustment and management of vascular access device: Secondary | ICD-10-CM | POA: Insufficient documentation

## 2019-12-04 DIAGNOSIS — C541 Malignant neoplasm of endometrium: Secondary | ICD-10-CM | POA: Diagnosis not present

## 2019-12-04 DIAGNOSIS — Z95828 Presence of other vascular implants and grafts: Secondary | ICD-10-CM

## 2019-12-04 MED ORDER — HEPARIN SOD (PORK) LOCK FLUSH 100 UNIT/ML IV SOLN
500.0000 [IU] | Freq: Once | INTRAVENOUS | Status: AC
  Administered 2019-12-04: 500 [IU] via INTRAVENOUS

## 2019-12-04 MED ORDER — SODIUM CHLORIDE 0.9% FLUSH
10.0000 mL | Freq: Once | INTRAVENOUS | Status: AC
  Administered 2019-12-04: 10 mL via INTRAVENOUS
  Filled 2019-12-04: qty 10

## 2020-01-06 ENCOUNTER — Ambulatory Visit
Admission: RE | Admit: 2020-01-06 | Discharge: 2020-01-06 | Disposition: A | Discharge status: Home / Self Care | Payer: 59 | Source: Ambulatory Visit | Attending: Family Medicine | Admitting: Family Medicine

## 2020-01-06 ENCOUNTER — Telehealth: Payer: Self-pay

## 2020-01-06 DIAGNOSIS — Z1231 Encounter for screening mammogram for malignant neoplasm of breast: Secondary | ICD-10-CM | POA: Diagnosis not present

## 2020-01-06 DIAGNOSIS — Z0000 Encounter for general adult medical examination without abnormal findings: Secondary | ICD-10-CM | POA: Insufficient documentation

## 2020-01-06 NOTE — Telephone Encounter (Signed)
-----   Message from Virginia Crews, MD sent at 01/06/2020  2:16 PM EST ----- Normal mammogram. Repeat in 1 yr

## 2020-01-06 NOTE — Telephone Encounter (Signed)
Pt advised.   Thanks,   -Layna Roeper  

## 2020-01-15 ENCOUNTER — Ambulatory Visit: Payer: 59 | Attending: Radiation Oncology | Admitting: Radiation Oncology

## 2020-01-20 ENCOUNTER — Inpatient Hospital Stay: Payer: Self-pay

## 2020-01-20 ENCOUNTER — Encounter: Payer: Self-pay | Admitting: Oncology

## 2020-01-20 ENCOUNTER — Inpatient Hospital Stay: Payer: Self-pay | Attending: Oncology | Admitting: Oncology

## 2020-01-20 ENCOUNTER — Other Ambulatory Visit: Payer: Self-pay

## 2020-01-20 DIAGNOSIS — Z95828 Presence of other vascular implants and grafts: Secondary | ICD-10-CM

## 2020-01-20 DIAGNOSIS — Z7901 Long term (current) use of anticoagulants: Secondary | ICD-10-CM | POA: Insufficient documentation

## 2020-01-20 DIAGNOSIS — C541 Malignant neoplasm of endometrium: Secondary | ICD-10-CM

## 2020-01-20 DIAGNOSIS — I2699 Other pulmonary embolism without acute cor pulmonale: Secondary | ICD-10-CM

## 2020-01-20 DIAGNOSIS — Z86711 Personal history of pulmonary embolism: Secondary | ICD-10-CM | POA: Insufficient documentation

## 2020-01-20 DIAGNOSIS — Z86718 Personal history of other venous thrombosis and embolism: Secondary | ICD-10-CM | POA: Insufficient documentation

## 2020-01-20 LAB — CBC WITH DIFFERENTIAL/PLATELET
Abs Immature Granulocytes: 0.02 10*3/uL (ref 0.00–0.07)
Basophils Absolute: 0 10*3/uL (ref 0.0–0.1)
Basophils Relative: 1 %
Eosinophils Absolute: 0.3 10*3/uL (ref 0.0–0.5)
Eosinophils Relative: 7 %
HCT: 39.9 % (ref 36.0–46.0)
Hemoglobin: 13 g/dL (ref 12.0–15.0)
Immature Granulocytes: 1 %
Lymphocytes Relative: 29 %
Lymphs Abs: 1.2 10*3/uL (ref 0.7–4.0)
MCH: 29.6 pg (ref 26.0–34.0)
MCHC: 32.6 g/dL (ref 30.0–36.0)
MCV: 90.9 fL (ref 80.0–100.0)
Monocytes Absolute: 0.3 10*3/uL (ref 0.1–1.0)
Monocytes Relative: 8 %
Neutro Abs: 2.3 10*3/uL (ref 1.7–7.7)
Neutrophils Relative %: 54 %
Platelets: 228 10*3/uL (ref 150–400)
RBC: 4.39 MIL/uL (ref 3.87–5.11)
RDW: 12.4 % (ref 11.5–15.5)
WBC: 4.1 10*3/uL (ref 4.0–10.5)
nRBC: 0 % (ref 0.0–0.2)

## 2020-01-20 LAB — COMPREHENSIVE METABOLIC PANEL
ALT: 16 U/L (ref 0–44)
AST: 16 U/L (ref 15–41)
Albumin: 4.3 g/dL (ref 3.5–5.0)
Alkaline Phosphatase: 67 U/L (ref 38–126)
Anion gap: 8 (ref 5–15)
BUN: 15 mg/dL (ref 6–20)
CO2: 25 mmol/L (ref 22–32)
Calcium: 9.3 mg/dL (ref 8.9–10.3)
Chloride: 106 mmol/L (ref 98–111)
Creatinine, Ser: 0.91 mg/dL (ref 0.44–1.00)
GFR calc Af Amer: >60 mL/min (ref >60)
GFR calc non Af Amer: >60 mL/min (ref >60)
Glucose, Bld: 98 mg/dL (ref 70–99)
Potassium: 3.8 mmol/L (ref 3.5–5.1)
Sodium: 139 mmol/L (ref 135–145)
Total Bilirubin: 0.9 mg/dL (ref 0.3–1.2)
Total Protein: 7.1 g/dL (ref 6.5–8.1)

## 2020-01-20 MED ORDER — SODIUM CHLORIDE 0.9% FLUSH
10.0000 mL | Freq: Once | INTRAVENOUS | Status: AC
  Administered 2020-01-20: 10 mL via INTRAVENOUS
  Filled 2020-01-20: qty 10

## 2020-01-20 MED ORDER — HEPARIN SOD (PORK) LOCK FLUSH 100 UNIT/ML IV SOLN
500.0000 [IU] | Freq: Once | INTRAVENOUS | Status: AC
  Administered 2020-01-20: 500 [IU] via INTRAVENOUS
  Filled 2020-01-20: qty 5

## 2020-01-21 LAB — CA 125: Cancer Antigen (CA) 125: 6.2 U/mL (ref 0.0–38.1)

## 2020-01-21 NOTE — Progress Notes (Signed)
HEMATOLOGY-ONCOLOGY TeleHEALTH VISIT PROGRESS NOTE  I connected with Karen Mann on 01/21/20 at  2:15 PM EST by video enabled telemedicine visit and verified that I am speaking with the correct person using two identifiers. I discussed the limitations, risks, security and privacy concerns of performing an evaluation and management service by telemedicine and the availability of in-person appointments. I also discussed with the patient that there may be a patient responsible charge related to this service. The patient expressed understanding and agreed to proceed.   Other persons participating in the visit and their role in the encounter:  None  Patient's location: Home  Provider's location: office Chief Complaint: Follow-up for FIGO 3 endometrioid adenocarcinoma, PE/DVT   INTERVAL HISTORY Karen Mann is a 59 y.o. female who has above history reviewed by me today presents for follow up visit for management of  FIGO 3 endometrioid adenocarcinoma, PE/DVT Problems and complaints are listed below:  Patient reports feeling well today. No new complaints.  She was last seen by me in July 2020. She was then seen by Dr. Fransisca Connors in September 2020.   Review of Systems  Constitutional: Negative for appetite change, chills, fatigue and fever.  HENT:   Negative for hearing loss and voice change.   Eyes: Negative for eye problems.  Respiratory: Negative for chest tightness and cough.   Cardiovascular: Negative for chest pain.  Gastrointestinal: Negative for abdominal distention, abdominal pain and blood in stool.  Endocrine: Negative for hot flashes.  Genitourinary: Negative for difficulty urinating and frequency.   Musculoskeletal: Positive for arthralgias.       Hip arthritis pain  Skin: Negative for itching and rash.  Neurological: Negative for extremity weakness.  Hematological: Negative for adenopathy.  Psychiatric/Behavioral: Negative for confusion.    Past Medical History:   Diagnosis Date  . Allergy   . Anemia 07/20/2018  . Endometrial adenocarcinoma ([place]) 07/2018   with chemo and rad tx  . Fibroid uterus 07/24/2018   5cm/2 inch   . Ovarian mass 07/24/2018   22cm/10 inch  . Pelvic mass   . Personal history of chemotherapy 2019  . Personal history of radiation therapy 2020   Past Surgical History:  Procedure Laterality Date  . ABDOMINAL HYSTERECTOMY  10/2018  . COLONOSCOPY WITH PROPOFOL N/A 10/31/2019   Procedure: COLONOSCOPY WITH PROPOFOL;  Surgeon: Virgel Manifold, MD;  Location: [facility] ENDOSCOPY;  Service: Endoscopy;  Laterality: N/A;  . EYE SURGERY  2000   Lasik  . IVC FILTER REMOVAL Right 02/07/2019   Procedure: IVC FILTER REMOVAL;  Surgeon: Algernon Huxley, MD;  Location: [place] [facility];  Service: Cardiovascular;  Laterality: Right;  . PORTA CATH INSERTION N/A 08/13/2018   Procedure: PORTA CATH INSERTION;  Surgeon: Algernon Huxley, MD;  Location: [place] [facility];  Service: Cardiovascular;  Laterality: N/A;  . Removal of IVC filter   02/07/2019   chemo completed     Family History  Problem Relation Age of Onset  . Hypertension Father   . Ovarian cancer Mother 38       deceased 60  . Healthy Brother   . Skin cancer Paternal Grandmother        deceased 78s  . Cancer Paternal Grandfather        unk. primary; deceased 12s  . Ovarian cancer Other        mat grandmother's sister    Social History   Socioeconomic History  . Marital status: Married    Spouse name:  Robbie   . Number of children: 1  . Years of education: Not on file  . Highest education level: Not on file  Occupational History  . Not on file  Tobacco Use  . Smoking status: Never Smoker  . Smokeless tobacco: Never Used  Substance and Sexual Activity  . Alcohol use: No  . Drug use: No  . Sexual activity: Not Currently    Birth control/protection: Post-menopausal  Other Topics Concern  . Not on file  Social History Narrative  . Not on file   Social  Determinants of Health   Financial Resource Strain:   . Difficulty of Paying Living Expenses: Not on file  Food Insecurity: No Food Insecurity  . Worried About Charity fundraiser in the Last Year: Never true  . Ran Out of Food in the Last Year: Never true  Transportation Needs: No Transportation Needs  . Lack of Transportation (Medical): No  . Lack of Transportation (Non-Medical): No  Physical Activity: Unknown  . Days of Exercise per Week: 0 days  . Minutes of Exercise per Session: Not on file  Stress:   . Feeling of Stress : Not on file  Social Connections: Unknown  . Frequency of Communication with Friends and Family: More than three times a week  . Frequency of Social Gatherings with Friends and Family: Not on file  . Attends Religious Services: Not on file  . Active Member of Clubs or Organizations: Not on file  . Attends Archivist Meetings: Not on file  . Marital Status: Married  Human resources officer Violence: Not At Risk  . Fear of Current or Ex-Partner: No  . Emotionally Abused: No  . Physically Abused: No  . Sexually Abused: No    Current Outpatient Medications on File Prior to Visit  Medication Sig Dispense Refill  . ELIQUIS 2.5 MG TABS tablet TAKE 1 TABLET TWICE A DAY 180 tablet 3   No current facility-administered medications on file prior to visit.    No Active Allergies     Observations/Objective: Today's Vitals   01/20/20 1409  PainSc: 0-No pain   There is no height or weight on file to calculate BMI.  Physical Exam  Constitutional: No distress.  Neurological: She is alert.  Psychiatric: Mood normal.    CBC    Component Value Date/Time   WBC 4.1 01/20/2020 1050   RBC 4.39 01/20/2020 1050   HGB 13.0 01/20/2020 1050   HGB 8.7 (L) 07/17/2018 1124   HCT 39.9 01/20/2020 1050   HCT 29.2 (L) 07/17/2018 1124   PLT 228 01/20/2020 1050   PLT 575 (H) 07/17/2018 1124   MCV 90.9 01/20/2020 1050   MCV 74 (L) 07/17/2018 1124   MCH 29.6 01/20/2020  1050   MCHC 32.6 01/20/2020 1050   RDW 12.4 01/20/2020 1050   RDW 16.2 (H) 07/17/2018 1124   LYMPHSABS 1.2 01/20/2020 1050   LYMPHSABS 2.3 11/27/2015 1008   MONOABS 0.3 01/20/2020 1050   EOSABS 0.3 01/20/2020 1050   EOSABS 0.5 (H) 11/27/2015 1008   BASOSABS 0.0 01/20/2020 1050   BASOSABS 0.1 11/27/2015 1008    CMP     Component Value Date/Time   NA 139 01/20/2020 1050   NA 137 07/17/2018 1124   K 3.8 01/20/2020 1050   CL 106 01/20/2020 1050   CO2 25 01/20/2020 1050   GLUCOSE 98 01/20/2020 1050   BUN 15 01/20/2020 1050   BUN 7 07/17/2018 1124   CREATININE 0.91 01/20/2020 1050  CALCIUM 9.3 01/20/2020 1050   PROT 7.1 01/20/2020 1050   PROT 6.5 07/17/2018 1124   ALBUMIN 4.3 01/20/2020 1050   ALBUMIN 3.8 07/17/2018 1124   AST 16 01/20/2020 1050   ALT 16 01/20/2020 1050   ALKPHOS 67 01/20/2020 1050   BILITOT 0.9 01/20/2020 1050   BILITOT 0.4 07/17/2018 1124   GFRNONAA >60 01/20/2020 1050   GFRAA >60 01/20/2020 1050     Assessment and Plan: 1. Endometrial adenocarcinoma ([place])   2. Bilateral pulmonary embolism ([place])   3. History of deep vein thrombosis (DVT) of lower extremity   4. Port-A-Cath in place     #History of FIGO 3 endometrioid adenocarcinoma Clinically she is doing well. Ca125 is stable at 6.2.   CT abdomen pelvis 09/09/2019, mild asymmetry of the left vaginal cuff which is unchanged.  Otherwise no evidence of metastatic disease.  Cholelithiasis.  Aortic atherosclerosis Recommend patient to see me in 6 months.  #History of bilateral pulmonary embolism, lower extremity DVT, Currently on anticoagulation prophylaxis with Eliquis 2.5 mg twice daily.  Continue.  She tolerates well. Reviewed and discussed with patient.  Screening mammogram showed no mammographic evidence of malignancy. #Port-A-Cath in place, continue port-every 6 to 8 weeks.  Recommend patient to keep port for another year.   Follow Up Instructions: 6 months   I discussed the assessment and  treatment plan with the patient. The patient was provided an opportunity to ask questions and all were answered. The patient agreed with the plan and demonstrated an understanding of the instructions.  The patient was advised to call back or seek an in-person evaluation if the symptoms worsen or if the condition fails to improve as anticipated.   Earlie Server, MD 01/21/2020 12:37 PM

## 2020-02-09 ENCOUNTER — Ambulatory Visit: Payer: Self-pay

## 2020-03-11 ENCOUNTER — Ambulatory Visit: Payer: 59

## 2020-03-11 ENCOUNTER — Other Ambulatory Visit: Payer: 59

## 2020-03-18 ENCOUNTER — Inpatient Hospital Stay: Payer: 59

## 2020-03-18 ENCOUNTER — Encounter: Payer: Self-pay | Admitting: Obstetrics and Gynecology

## 2020-03-18 ENCOUNTER — Inpatient Hospital Stay: Payer: 59 | Attending: Obstetrics and Gynecology | Admitting: Obstetrics and Gynecology

## 2020-03-18 VITALS — BP 133/89 | HR 93 | Temp 97.1°F | Resp 18 | Wt 245.0 lb

## 2020-03-18 DIAGNOSIS — Z7901 Long term (current) use of anticoagulants: Secondary | ICD-10-CM | POA: Insufficient documentation

## 2020-03-18 DIAGNOSIS — Z452 Encounter for adjustment and management of vascular access device: Secondary | ICD-10-CM | POA: Insufficient documentation

## 2020-03-18 DIAGNOSIS — C541 Malignant neoplasm of endometrium: Secondary | ICD-10-CM

## 2020-03-18 DIAGNOSIS — Z9079 Acquired absence of other genital organ(s): Secondary | ICD-10-CM | POA: Diagnosis not present

## 2020-03-18 DIAGNOSIS — Z8249 Family history of ischemic heart disease and other diseases of the circulatory system: Secondary | ICD-10-CM | POA: Diagnosis not present

## 2020-03-18 DIAGNOSIS — Z86711 Personal history of pulmonary embolism: Secondary | ICD-10-CM | POA: Diagnosis not present

## 2020-03-18 DIAGNOSIS — Z95828 Presence of other vascular implants and grafts: Secondary | ICD-10-CM

## 2020-03-18 DIAGNOSIS — Z90722 Acquired absence of ovaries, bilateral: Secondary | ICD-10-CM | POA: Diagnosis not present

## 2020-03-18 DIAGNOSIS — Z8041 Family history of malignant neoplasm of ovary: Secondary | ICD-10-CM | POA: Insufficient documentation

## 2020-03-18 DIAGNOSIS — Z8542 Personal history of malignant neoplasm of other parts of uterus: Secondary | ICD-10-CM | POA: Diagnosis present

## 2020-03-18 DIAGNOSIS — Z923 Personal history of irradiation: Secondary | ICD-10-CM | POA: Diagnosis not present

## 2020-03-18 DIAGNOSIS — Z90710 Acquired absence of both cervix and uterus: Secondary | ICD-10-CM | POA: Insufficient documentation

## 2020-03-18 DIAGNOSIS — Z9221 Personal history of antineoplastic chemotherapy: Secondary | ICD-10-CM | POA: Insufficient documentation

## 2020-03-18 MED ORDER — HEPARIN SOD (PORK) LOCK FLUSH 100 UNIT/ML IV SOLN
500.0000 [IU] | Freq: Once | INTRAVENOUS | Status: AC
  Administered 2020-03-18: 500 [IU] via INTRAVENOUS
  Filled 2020-03-18: qty 5

## 2020-03-18 MED ORDER — SODIUM CHLORIDE 0.9% FLUSH
10.0000 mL | Freq: Once | INTRAVENOUS | Status: AC
  Administered 2020-03-18: 10 mL via INTRAVENOUS
  Filled 2020-03-18: qty 10

## 2020-03-18 NOTE — Progress Notes (Signed)
Gynecologic Oncology Progress Note   Referring Provider:  Dr. Gilman Schmidt  Chief Complaint: Stage IIIA grade 3 endometrial adenocarcinoma  Subjective:  Karen Mann is a 59 y.o. 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   She returns today for surveillance. Doing well overall with no GI, GU or Resp complaints. Has bilateral hip pain and plans for hip replacement later this year.  Also has IV portacath, but Dr Tasia Catchings would like to keep this until next year.  She has had a colonoscopy on 10/31/2019 which showed diverticulosis, otherwise normal, and recommendation to repeat in 10 years around 10/2029  Mammogram 01/06/2020 reported as bi-rads category 1: negative.   She continues eliquis for history of VTE.   She has an appt for the COVID vaccine this weekend.   CA 125 has been followed.  01/02/2019 6.0 02/18/2019 3.9 05/08/2019 5.6 06/12/2019 5.9 09/11/2019 6.4 01/20/2020 6.2   Oncology History She was seen in GYN oncology clinic and reported continual vaginal bleeding for 5-6 months fluctuating from light to heavy (saturating tampon every hour).  She was found to be significantly anemic and started on iron pills.  She was also having significant dyspnea. Patient initially referred to Dr. Gilman Schmidt by PCP, Dr. Brita Romp, d/t large abdominal mass seen on pelvic ultrasound concerning for ovarian cancer. Patient first noticed bloating, fatigue, and constipation in July 2019. She presented to PCP. FSH 6.4, LH 5.8. 07/23/18- Ultrasound was performed which showed: pelvic mass measuring 20 x 13 x 22 cm with cystic and solid components. Ultrasound also showed: 4-5cm fibroid and thickened endometrium of 43m  08/05/18- MR PELVIS W WO CONTRAST - 20.6 cm (17.1 x 20.6 x 20.0 cm) complex cystic/solid mass  in the pelvis w/ numerous thickened/enhancing septations, compatible with malignant ovarian neoplasm. Neither ovary is discretely visualized. - Small volume abdominopelvic ascites. Gross peritoneal disease is not visualized but cannot be excluded. - Left hydroureteronephrosis secondary to extrinsic compression. - 5.3 intramural anterior uterine body fibroid  On exam, Dr. SGilman Schmidtnoted bilateral lower extremity swelling with mass extending close to sternum, ~ 30 cm. Endometrial biopsy was performed. Pathology: Endometrioid Adenocarcinoma consistent with FIGO grade 3  CA 125  08/06/2018 622.0  08/09/18- CT C/A/P-imaging showed a large cystic/solid mass measuring 28 x 11.6 x 21.4 cm suspicious for ovarian malignancy along with moderate ascites, large right pleural effusion, and moderate left and mild right hydronephrosis attributed to ureteral compression due to mass.   08/15/2018-initiated carboplatin AUC 6 and paclitaxel 175 mg/m.    CA 125- 692  09/05/2018-cycle 2 carboplatin AUC 6 and paclitaxel 175 mg/m.  [id number]  Mild hypersensitivity to paclitaxel with chest tightness. Taxol. Solu-Medrol and Benadryl given along with fluid bolus.  Challenged and patient was able to tolerate treatment at slower rate.  09/21/2018-planned for hysterectomy, however, patient diagnosed with PE/DVT.  CT chest PE protocol- 1.  Bilateral pulmonary emboli, most centrally in the bilateral main pulmonary arteries.  No evidence of right heart strain or pulmonary infarct. 2.  Moderate right pleural effusion 3.  Partially visualized left hydronephrosis  UKoreabilateral lower extremities revealed 1.  Deep vein thrombus in the right popliteal vein, thrombus extends into the right tibioperoneal trunk and proximal and mid portions of the right peroneal veins. 2.  Deep vein thrombus in the left common femoral vein and in the left popliteal  vein. 3.  Occlusive thrombus in seen on in the left greater saphenous vein, superficial vein.   Echo revealed LVEF 55%.   09/26/2018-underwent placement of IVC filter in IR.  She was started on therapeutic Lovenox.   She proceeded with cycle 3 carboplatin AUC 6 and paclitaxel 175 mg/m with Neulasta support on 09/26/2018.   Ca 125- 521  10/19/2018-CT C/A/P 1. Redemonstrated large cystic and solid mass measuring similarly in size. 2. Improved left hydronephrosis. 3.  Improved ascites with redemonstration of a moderate right pleural effusion.  Redemonstrated large cystic solid pelvic mass, dorsal to the uterus and inseparable from the ovaries, measuring approximately 22.8 x 21.9 x 12.3 cm previously measuring 21.6 x 21.2 x 14.8 cm.  Slightly improved ascites.  She underwent palliative thoracentesis on 10/26/2018, 10/11/18, 10/02/18, 09/19/18, 09/10/18, 08/30/18, 08/22/18, 08/16/18, 08/10/18. Pleural fluid was suspicious for malignancy on 10/02/18 with rare atypical PAX-8 and MOC-31 positive cells present.   10/29/18- patient underwent exploratory laparotomy, total abdominal hysterectomy, bilateral salpingo-oophorectomy, omentectomy, and right ureterolysis at [facility]. EBL 500 ml and she received 2 units of PRBCs during her admission postoperatively. Removal of uterus and very large right ovarian metastasis that was adherent to the pelvic structures. No evidence of carcinomatosis. Parts of the mass that were adherent to the pelvic peritoneum after it was removed were also resected and had cancer.    Pathology-  DIAGNOSIS A. Mesenteric nodule, excision:  - Benign inclusion with transitional type epithelium. Negative for malignancy.    B. Right pelvic peritoneum, excision: - Mesothelial-lined fibromuscular and adipose tissue. Negative for malignancy.    C. Appendices epiploica, excision: - Metastatic adenocarcinoma.    D. Omentum, omentectomy:  - Omentum. Negative for malignancy.    E. Uterus, bilateral ovaries and fallopian tubes, hysterectomy and bilateral salpingo-oophorectomy:  - Endometrioid  adenocarcinoma of the endometrium. See note FIGO grade: 3 of 3. Tumor size: 3.5 cm Depth of invasion: 3 mm in a 37 mm thick myometrium Cervical involvement: Not identified  Adnexal involvement: Metastatic carcinoma involves bilateral ovaries. Lymphovascular invasion: Not identified   MSI-stable  -Abdominal fluid-negative.  No evidence of malignancy.   She completed 3 additional cycles of adjuvant chemotherapy with carbo-Taxol on 11/21/2018-01/02/2019.  01/21/2019-CT C/A/P 1. Interval resection of dominant abdominopelvic mass with resolution of abdominal ascites 2. Hysterectomy with asymmetric soft tissue at the left superior portion of the vaginal cuff. Cannot exclude residual or recurrent disease in this area. This could either be re-evaluated at CT follow-up of 3 months or more entirely characterized with PET 3. Otherwise, no evidence of metastatic disease in the chest, abdomen, or pelvis 4. Cholelithiasis 5.  Possible constipation  11/19 Germline genetic testing with In [facility]ae was negative for mutations in BRCA1/2 and other HR pathway genes.  Possible mosaic variant in NF1 (not clinically significant per genetic counselor) and VUS in POLE and RECQL4.   CA 125 58.3 11/21/2018 6.0 01/02/2019  She initiated pelvic radiation with Dr. Baruch Gouty 01/22/2019 because fragments of ovarian mass that were peeled off the pelvic peritoneum after main mass removed had cancer. Finished radiation in 2/20 including pelvic ERT and vaginal bracytherapy.   CT abd/pelvis IMPRESSION: 1. Stable soft tissue density involving the left vaginal cuff. This may be postop in nature, although residual tumor at this site cannot definitely be excluded. Recommend continued follow-up by CT in 3 months. 2. No new or progressive disease identified within the abdomen or pelvis. 3. Cholelithiasis. No radiographic evidence of cholecystitis.  IVC filter removed 02/07/19 and she  is taking Eliquis.  Genetic Testing-  Microsatellite stable, TMB low (3) -Foundation One PD-L1 - TPS 0% PTEN, TP53, PIK3CA mutations  Family History: She reports her mother died of ovarian cancer at age 64 (diagnosed at age 43).    Family History  Problem Relation Age of Onset  . Hypertension Father   . Ovarian cancer Mother 48       deceased 16  . Healthy Brother   . Skin cancer Paternal Grandmother        deceased 80s  . Cancer Paternal Grandfather        unk. primary; deceased 77s  . Ovarian cancer Other        mat grandmother's sister    Problem List: Patient Active Problem List   Diagnosis Date Noted  . Chronic anticoagulation 01/02/2019  . Encounter for antineoplastic chemotherapy 01/02/2019  . Neuropathy due to chemotherapeutic drug ([place]) 01/02/2019  . S/P insertion of IVC (inferior vena caval) filter 09/26/2018  . Bilateral pulmonary embolism ([place]) 09/22/2018  . DVT (deep venous thrombosis) ([place]) 09/22/2018  . Anxiety associated with cancer diagnosis 09/07/2018  . Recurrent pleural effusion on right 09/07/2018  . Chemotherapy-induced peripheral neuropathy ([place]) 09/07/2018  . Iron deficiency anemia due to chronic blood loss 08/10/2018  . Goals of care, counseling/discussion 08/10/2018  . Endometrial adenocarcinoma ([place]) 08/10/2018  . Arthritis 12/01/2015  . Secondary pulmonary hypertension 11/24/2015  . Allergic rhinitis 03/02/2010  . Varicose veins of lower extremities with inflammation 01/06/2010  . Chronic venous insufficiency 01/06/2010  . Nonrheumatic tricuspid valve disorder 12/30/2009  . Fibroid 12/09/2009  . Family history of malignant neoplasm of ovary 12/02/2009    Past Medical History: Past Medical History:  Diagnosis Date  . Allergy   . Anemia 07/20/2018  . Endometrial adenocarcinoma ([place]) 07/2018   with chemo and rad tx  . Fibroid uterus 07/24/2018   5cm/2 inch   . Ovarian mass 07/24/2018   22cm/10 inch  . Pelvic mass   . Personal history of chemotherapy 2019  . Personal  history of radiation therapy 2020    Past Surgical History: Past Surgical History:  Procedure Laterality Date  . ABDOMINAL HYSTERECTOMY  10/2018  . COLONOSCOPY WITH PROPOFOL N/A 10/31/2019   Procedure: COLONOSCOPY WITH PROPOFOL;  Surgeon: Virgel Manifold, MD;  Location: [facility] ENDOSCOPY;  Service: Endoscopy;  Laterality: N/A;  . EYE SURGERY  2000   Lasik  . IVC FILTER REMOVAL Right 02/07/2019   Procedure: IVC FILTER REMOVAL;  Surgeon: Algernon Huxley, MD;  Location: [place] [facility];  Service: Cardiovascular;  Laterality: Right;  . PORTA CATH INSERTION N/A 08/13/2018   Procedure: PORTA CATH INSERTION;  Surgeon: Algernon Huxley, MD;  Location: [place] [facility];  Service: Cardiovascular;  Laterality: N/A;  . Removal of IVC filter   02/07/2019   chemo completed     Past Gynecologic History:  G1P1 Age of Menarche: 60 Regular menstrual periods Denies abnormal pap smears Denies history of STDs  OB History:  OB History  Gravida Para Term Preterm AB Living  _0 SAB TAB Ectopic Multiple Live Births          1    # Outcome Date GA Lbr Len/2nd Weight Sex Delivery Anes PTL Lv  1 Term 09/03/85 [redacted]w[redacted]d 9 lb 2.5 oz (4.153 kg) M Vag-Spont   LIV    Family History: Family History  Problem Relation Age of Onset  . Hypertension Father   .  Ovarian cancer Mother 41       deceased 54  . Healthy Brother   . Skin cancer Paternal Grandmother        deceased 65s  . Cancer Paternal Grandfather        unk. primary; deceased 41s  . Ovarian cancer Other        mat grandmother's sister    Social History: Social History   Socioeconomic History  . Marital status: Married    Spouse name: Heath Lark   . Number of children: 1  . Years of education: Not on file  . Highest education level: Not on file  Occupational History  . Not on file  Tobacco Use  . Smoking status: Never Smoker  . Smokeless tobacco: Never Used  Substance and Sexual Activity  . Alcohol use: No  . Drug use: No   . Sexual activity: Not Currently    Birth control/protection: Post-menopausal  Other Topics Concern  . Not on file  Social History Narrative  . Not on file   Social Determinants of Health   Financial Resource Strain:   . Difficulty of Paying Living Expenses:   Food Insecurity:   . Worried About Charity fundraiser in the Last Year:   . Arboriculturist in the Last Year:   Transportation Needs:   . Film/video editor (Medical):   Marland Kitchen Lack of Transportation (Non-Medical):   Physical Activity:   . Days of Exercise per Week:   . Minutes of Exercise per Session:   Stress:   . Feeling of Stress :   Social Connections:   . Frequency of Communication with Friends and Family:   . Frequency of Social Gatherings with Friends and Family:   . Attends Religious Services:   . Active Member of Clubs or Organizations:   . Attends Archivist Meetings:   Marland Kitchen Marital Status:   Intimate Partner Violence:   . Fear of Current or Ex-Partner:   . Emotionally Abused:   Marland Kitchen Physically Abused:   . Sexually Abused:     Allergies: No Active Allergies  Current Medications: Current Outpatient Medications  Medication Sig Dispense Refill  . ELIQUIS 2.5 MG TABS tablet TAKE 1 TABLET TWICE A DAY 180 tablet 3   No current facility-administered medications for this visit.   Facility-Administered Medications Ordered in Other Visits  Medication Dose Route Frequency Provider Last Rate Last Admin  . heparin lock flush 100 unit/mL  500 Units Intravenous Once Earlie Server, MD      . sodium chloride flush (NS) 0.9 % injection 10 mL  10 mL Intravenous Once Earlie Server, MD       Review of Systems General:  no complaints Skin: no complaints Eyes: no complaints HEENT: no complaints Breasts: no complaints Pulmonary: no complaints Cardiac: no complaints Gastrointestinal: no complaints Genitourinary/Sexual: no complaints Ob/Gyn: no complaints Musculoskeletal: no complaints Hematology: no  complaints Neurologic/Psych: no complaints  Objective:  Physical Examination:  Today's Vitals   03/18/20 1122  BP: 133/89  Pulse: 93  Resp: 18  Temp: (!) 97.1 F (36.2 C)  TempSrc: Tympanic  SpO2: 100%  Weight: 245 lb (111.1 kg)  PainSc: 0-No pain   Body mass index is 36.18 kg/m.   ECOG Performance Status: 0 - Asymptomatic  GENERAL: Patient is a well appearing female in no acute distress HEENT:  Sclera clear. Anicteric NODES:  Negative axillary, supraclavicular, inguinal lymph node survery LUNGS:  Clear to auscultation bilaterally.  IV port on right  chest wall.   HEART:  Regular rate and rhythm.  ABDOMEN:  Soft, nontender.  No hernias, incisions well healed. No masses or ascites EXTREMITIES:  No peripheral edema. Atraumatic. No cyanosis. Limited mobility in right hip.  SKIN:  Clear with no obvious rashes or skin changes.  NEURO:  Nonfocal. Well oriented.  Appropriate affect.  Pelvis exam EGBUS/Vag: normal. Bimanual/RV: no masses or nodularity   Assessment:  Karen Mann is a 59 y.o. female diagnosed with Stage IIIA grade 3 endometrioid endometrial adenocarcinoma 8/19 on endometrial biopsy.  She had pleural effusions that required multiple thoracenteses and were suspicious for cancer.  Received 3 cycles of carbo/taxol chemotherapy and developed DVT and pulmonary emboli and these were treated with Lovonox and IVC filter.  Then underwent TAH, BSO, omentectomy 10/29/18 for removal of uterus and very large right ovarian metastasis that was adherent to the pelvic structures. No evidence of carcinomatosis. Parts of the mass that were adherent to the pelvic peritoneum after mass was removed had cancer and were excised.  Then had 3 additional cycles of carboplatin/taxol.  CT scan C/A/P 2/20 showed area of soft tissue asymmetry above left vaginal cuff, but I did not feel anything on pelvic exam.  External pelvic radiation and vaginal brachytherapy 2/20. Repeat CT scan A/P 4/20 and 9/20  stable.  No change in vaginal cuff area. She feels well and has no complaints. CA125 stable at 6.  NED    Continues on Eliquis for PEs diagnosed 9/19 and IVC filter removed 2/20.  Genetic Testing- Microsatellite stable, TMB low (3). Foundation One PD-L1 - TPS 0%, PTEN, TP53, PIK3CA mutations.  Germline genetic testing negative in 11/19. In vitae panel showed possible significant variant in NF1. Genetic counselor discussed significance with patient.   Plan:   Problem List Items Addressed This Visit      Genitourinary   Endometrial adenocarcinoma ([place]) - Primary      We will see her in clinic for exam in 6 months or sooner if concerning symptoms arise.  Will check CA125 as well today.   She will see Dr Tasia Catchings in 3 months for continued management of her IV port and anticoagulation.  In the meantime she is sheltering in place and taking all precautions to avoid COVID infection and will get the vaccine this weekend.     Considering hip replacement for arthritis and do not think there are any contraindications other than DVT in 2019.  She continues on Eliquis and still has IV port a cath.  Would leave this in for at least a year.  I personally interviewed and examined the patient. Agreed with the above/below plan of care. Patient/family questions were answered.   Verlon Au, NP  I personally interviewed and examined the patient. Agreed with the above/below plan of care. I have directly contributed to assessment and plan of care of this patient and educated and discussed with patient and family.  Mellody Drown, MD    CC:  Brita Romp [address], [address] [place],  [place] [address] [phone]

## 2020-03-19 LAB — CA 125: Cancer Antigen (CA) 125: 6.7 U/mL (ref 0.0–38.1)

## 2020-03-22 ENCOUNTER — Other Ambulatory Visit: Payer: Self-pay

## 2020-03-22 ENCOUNTER — Ambulatory Visit: Payer: 59 | Attending: Internal Medicine

## 2020-03-22 DIAGNOSIS — Z23 Encounter for immunization: Secondary | ICD-10-CM

## 2020-03-22 NOTE — Progress Notes (Signed)
   Covid-19 Vaccination Clinic  Name:  Karen Mann    MRN: [id number] DOB: 1961-10-17  03/22/2020  [name] was observed post Covid-19 immunization for 15 minutes without incident. She was provided with Vaccine Information Sheet and instruction to access the V-Safe system.   [name] was instructed to call 911 with any severe reactions post vaccine: Marland Kitchen Difficulty breathing  . Swelling of face and throat  . A fast heartbeat  . A bad rash all over body  . Dizziness and weakness   Immunizations Administered    Name Date Dose VIS Date Route   Pfizer COVID-19 Vaccine 03/22/2020 10:14 AM 0.3 mL 12/06/2019 Intramuscular   Manufacturer: [employer], [employer]   Lot: [id number]   [place]: [id number]

## 2020-04-15 ENCOUNTER — Ambulatory Visit: Payer: 59 | Attending: Internal Medicine

## 2020-04-15 DIAGNOSIS — Z23 Encounter for immunization: Secondary | ICD-10-CM

## 2020-04-15 NOTE — Progress Notes (Signed)
   Covid-19 Vaccination Clinic  Name:  Karen Mann    MRN: [id number] DOB: 02-24-1961  04/15/2020  [name] was observed post Covid-19 immunization for 30 minutes based on pre-vaccination screening without incident. She was provided with Vaccine Information Sheet and instruction to access the V-Safe system.   [name] was instructed to call 911 with any severe reactions post vaccine: Marland Kitchen Difficulty breathing  . Swelling of face and throat  . A fast heartbeat  . A bad rash all over body  . Dizziness and weakness   Immunizations Administered    Name Date Dose VIS Date Route   Pfizer COVID-19 Vaccine 04/15/2020  9:54 AM 0.3 mL 02/19/2019 Intramuscular   Manufacturer: [place]   Lot: [id number]   [place]: [id number]

## 2020-06-03 ENCOUNTER — Other Ambulatory Visit: Payer: Self-pay

## 2020-06-03 DIAGNOSIS — C541 Malignant neoplasm of endometrium: Secondary | ICD-10-CM

## 2020-06-04 ENCOUNTER — Other Ambulatory Visit: Payer: Self-pay

## 2020-06-04 ENCOUNTER — Inpatient Hospital Stay (HOSPITAL_BASED_OUTPATIENT_CLINIC_OR_DEPARTMENT_OTHER): Payer: 59 | Admitting: Oncology

## 2020-06-04 ENCOUNTER — Encounter: Payer: Self-pay | Admitting: Oncology

## 2020-06-04 ENCOUNTER — Inpatient Hospital Stay: Payer: 59 | Attending: Oncology

## 2020-06-04 VITALS — BP 136/85 | HR 79 | Temp 97.0°F | Wt 254.0 lb

## 2020-06-04 DIAGNOSIS — C541 Malignant neoplasm of endometrium: Secondary | ICD-10-CM

## 2020-06-04 DIAGNOSIS — I2699 Other pulmonary embolism without acute cor pulmonale: Secondary | ICD-10-CM | POA: Diagnosis not present

## 2020-06-04 DIAGNOSIS — Z7901 Long term (current) use of anticoagulants: Secondary | ICD-10-CM | POA: Insufficient documentation

## 2020-06-04 DIAGNOSIS — Z452 Encounter for adjustment and management of vascular access device: Secondary | ICD-10-CM | POA: Insufficient documentation

## 2020-06-04 DIAGNOSIS — I87009 Postthrombotic syndrome without complications of unspecified extremity: Secondary | ICD-10-CM

## 2020-06-04 DIAGNOSIS — Z86711 Personal history of pulmonary embolism: Secondary | ICD-10-CM | POA: Diagnosis not present

## 2020-06-04 DIAGNOSIS — Z86718 Personal history of other venous thrombosis and embolism: Secondary | ICD-10-CM

## 2020-06-04 DIAGNOSIS — Z79899 Other long term (current) drug therapy: Secondary | ICD-10-CM | POA: Diagnosis not present

## 2020-06-04 DIAGNOSIS — Z90710 Acquired absence of both cervix and uterus: Secondary | ICD-10-CM | POA: Diagnosis not present

## 2020-06-04 DIAGNOSIS — Z95828 Presence of other vascular implants and grafts: Secondary | ICD-10-CM

## 2020-06-04 DIAGNOSIS — T451X5A Adverse effect of antineoplastic and immunosuppressive drugs, initial encounter: Secondary | ICD-10-CM

## 2020-06-04 DIAGNOSIS — G620 Drug-induced polyneuropathy: Secondary | ICD-10-CM

## 2020-06-04 LAB — CBC WITH DIFFERENTIAL/PLATELET
Abs Immature Granulocytes: 0.01 10*3/uL (ref 0.00–0.07)
Basophils Absolute: 0 10*3/uL (ref 0.0–0.1)
Basophils Relative: 1 %
Eosinophils Absolute: 0.4 10*3/uL (ref 0.0–0.5)
Eosinophils Relative: 8 %
HCT: 37.8 % (ref 36.0–46.0)
Hemoglobin: 12.6 g/dL (ref 12.0–15.0)
Immature Granulocytes: 0 %
Lymphocytes Relative: 33 %
Lymphs Abs: 1.5 10*3/uL (ref 0.7–4.0)
MCH: 29.4 pg (ref 26.0–34.0)
MCHC: 33.3 g/dL (ref 30.0–36.0)
MCV: 88.1 fL (ref 80.0–100.0)
Monocytes Absolute: 0.3 10*3/uL (ref 0.1–1.0)
Monocytes Relative: 6 %
Neutro Abs: 2.4 10*3/uL (ref 1.7–7.7)
Neutrophils Relative %: 52 %
Platelets: 226 10*3/uL (ref 150–400)
RBC: 4.29 MIL/uL (ref 3.87–5.11)
RDW: 12.7 % (ref 11.5–15.5)
WBC: 4.6 10*3/uL (ref 4.0–10.5)
nRBC: 0 % (ref 0.0–0.2)

## 2020-06-04 LAB — COMPREHENSIVE METABOLIC PANEL
ALT: 15 U/L (ref 0–44)
AST: 16 U/L (ref 15–41)
Albumin: 4 g/dL (ref 3.5–5.0)
Alkaline Phosphatase: 71 U/L (ref 38–126)
Anion gap: 7 (ref 5–15)
BUN: 16 mg/dL (ref 6–20)
CO2: 26 mmol/L (ref 22–32)
Calcium: 8.9 mg/dL (ref 8.9–10.3)
Chloride: 109 mmol/L (ref 98–111)
Creatinine, Ser: 0.73 mg/dL (ref 0.44–1.00)
GFR calc Af Amer: >60 mL/min (ref >60)
GFR calc non Af Amer: >60 mL/min (ref >60)
Glucose, Bld: 105 mg/dL — ABNORMAL HIGH (ref 70–99)
Potassium: 4 mmol/L (ref 3.5–5.1)
Sodium: 142 mmol/L (ref 135–145)
Total Bilirubin: 0.9 mg/dL (ref 0.3–1.2)
Total Protein: 6.9 g/dL (ref 6.5–8.1)

## 2020-06-04 MED ORDER — HEPARIN SOD (PORK) LOCK FLUSH 100 UNIT/ML IV SOLN
INTRAVENOUS | Status: AC
  Filled 2020-06-04: qty 5

## 2020-06-04 MED ORDER — HEPARIN SOD (PORK) LOCK FLUSH 100 UNIT/ML IV SOLN
500.0000 [IU] | Freq: Once | INTRAVENOUS | Status: AC
  Administered 2020-06-04: 500 [IU] via INTRAVENOUS
  Filled 2020-06-04: qty 5

## 2020-06-04 MED ORDER — SODIUM CHLORIDE 0.9% FLUSH
10.0000 mL | INTRAVENOUS | Status: DC | PRN
  Administered 2020-06-04: 10 mL via INTRAVENOUS
  Filled 2020-06-04: qty 10

## 2020-06-04 NOTE — Progress Notes (Signed)
Hematology/Oncology follow up  note [facility] [phone] [phone]   Patient Care Team: Virginia Crews, MD as PCP - General (Family Medicine) Clent Jacks, RN as Registered Nurse  REFERRING PROVIDER: [place] VISIT Follow up for management of  endometrial cancer.   HISTORY OF PRESENTING ILLNESS:  Karen Mann is a  59 y.o.  female with PMH listed below who was referred to me for evaluation of newly diagnosed endometrial cancer. Patient was recently referred to GYN Dr. Gilman Schmidt for abnormal uterine bleeding. 07/23/2018 ultrasound was performed showed large pelvic mass 20 x 13 x 22 cm with cystic and solid components.  Ultrasound also showed 4.9 cm fibroid and thickened endometrium 15 mm thickness 08/05/2018 MRI pelvis with and without contrast 20.6 cm complex cystic solid mass in the pelvis with numerous thickened enhancing septations, compatible with malignant ovarian neoplasm.  Neither ovary was discretely visualized.  Small volume of ascites.  Cross paratonia disease is not visualized but not excluded.  Left hydro-ureteronephrosis secondary to extrinsic compression. 5.3 intramural anterior uterine body fibroid.  #Endometrial biopsy was performed.  Pathology showed endometrioid adenocarcinoma consistent with FIGO grade 3.  8/12/19CA125 was elevated at 622 Patient was referred to see Dr. Fransisca Connors.  She was evaluated by Dr. Meredith Mody on 08/08/2018.  Staging CT was obtained. #Images independently reviewed by me.   Staging CT showed large cystic and a solid mass likely arising from the ovaries, measures 369 0 cc, suspicious for ovarian malignancy.  Moderate ascites noted.  Raising concern for malignant peritoneal spread.  No well-defined omental caking or solid tumor deposits along a situs., -Large right pleural effusion over half of right hemithorax volume. -Liver hypodensity 2.8 x 2.8 cm -Moderate left and mild right  hydronephrosis attributed to the ureteral compression due to mass. -Other chronic image findings.  MRI pelvis with and without contrast again showed 20.6 cm complex cystic/solid mass in the pelvis.  Compatible with malignant ovarian neoplasm.  Small volume of a situs.  Cross peritoneal disease is not visualized but cannot be excluded.  Left hydroureteronephrosis secondary to compression.  # Dr. Fransisca Connors recommends systemic therapy with carboplatin and paclitaxel 3 cycles, reassessment for surgical evaluation.per Dr.Berchuck, not candidate for Pembrolizumab and Levetinib trial.   # 08/10/2018 thoracentesis  with 1.2 L fluid removed.  Cytology negative for malignancy. 08/16/2018 status post thoracentesis again and removed 2.2 L of mildly bloody fluid.  I discussed with Dr. Dicie Beam and she reviewed the slides there was no malignancy cells. Pleural fluid LDH 246, albumin 2.5, protein 4.2 Serum LDH 270.  Protein level 6.4 08/22/2018, another thoracentesis both liters of fluid.  Cytology negative.  Patient's case was discussed on GYN tumor conference.  Dr. Theora Gianotti recommends NexGen sequence molecular testing.  And also check for PDL 1.  # CT PE study showed bilateral pulmonary embolism with right heart strain..  Lower extremity venous Doppler showed bilateral DVT, started on anticoagulation.  10/29/2018 patient underwent debulking procedure by Dr. Fransisca Connors. Pathology showed A. Mesenteric nodule, excision:Benign inclusion with transitional type epithelium.Negative for malignancy.  B. Right pelvic peritoneum, excision:Mesothelial-lined fibromuscular and adipose tissue.Negative for malignancy.  C. Appendices epiploica, excision:Metastatic adenocarcinoma D. Omentum, omentectomy: Omentum.Negative for malignancy.  E. Uterus, bilateral ovaries and fallopian tubes, hysterectomy and bilateral salpingo-oophorectomy:  Endometrioid adenocarcinoma of the endometrium. See note FIGO grade: 3 of 3. Tumor size: 3.5  cm Depth of invasion: 3 mm in a 37 mm thick myometrium Cervical involvement: Not identified  Adnexal involvement: Metastatic  carcinoma involves bilateral ovaries. Lymphovascular invasion: Not identified   Microsatellite instability testing has been ordered on block E5. Note: The residual carcinoma in the uterus is present in a background of abundant macrophages and lymphocytes, consistent with a partial response to therapy. No significant response to therapy is identified in the tumor in the ovaries. The tumor grade is based on the combination of architectural and cytologic features.  SPECIMEN   Procedure:  Total hysterectomy and bilateral salpingo-oophorectomy    Procedure:  Omentectomy    Procedure:  Peritoneal biopsies    Specimen Integrity:  Received disrupted  TUMOR   Tumor Site:  Fundus    Histologic Type:  Endometrioid carcinoma, NOS    Histologic Grade:  FIGO grade 3    Tumor Size:  Greatest dimension in Centimeters (cm): 3.5 Centimeters (cm)   Tumor Extent:      Myometrial Invasion:  Present      Depth of Invasion in Millimeters (mm):  3 Millimeters (mm)     Myometrial Thickness in Millimeters (mm):  37 Millimeters (mm)     Percentage of Myometrial Invasion:  8 %    Adenomyosis:  Present, involved by carcinoma     Uterine Serosa Involvement:  Not identified     Lower Uterine Segment Involvement:  Not identified     Cervical Stromal Involvement:  Not identified     Other Tissue / Organ Involvement:  Right ovary     Other Tissue / Organ Involvement:  Left ovary     Other Tissue / Organ Involvement:  Appendices epiploica    Accessory Findings:      Lymphovascular Invasion:  Not identified  Margins LYMPH NODES   Regional Lymph Nodes:  No lymph nodes submitted or found  PATHOLOGIC STAGE CLASSIFICATION (pTNM, AJCC 8th Edition)   Primary Tumor (pT):  pT3a    Regional Lymph Nodes (pN):       Category (pN):  pNX    Distant Metastasis (pM):  pM1     Site(s):  Appendices epiploica  ADDITIONAL FINDINGS   Additional Pathologic Findings:  Uterine leiomyomata    #  Family history ovarian cancer and personal history of endometrial CA.Patient also has had genetic testing done which showed 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  She has discussed findings with Dietitian.  She does not have features of NF.  Not meeting criteria for diagnosis of NF1.  Patient's case was discussed on [place] oncology tumor board.  Consensus was to continue with additional 3 cycles of carboplatin and paclitaxel.  Follow CA125 for response.  Repeat imaging after finishing chemotherapy.  Can consider radiation at that time.  # IVC filter was removed on 02/07/2019.  #08/15/2018-09/26/2018 3 cycles neoadjuvant carboplatin and Taxol #11/21/2018 - 01/02/2019, 3 cycles of adjuvant carboplatin and Taxol #03/11/2019 finished adjuvant radiation.  INTERVAL HISTORY Karen Mann is a 59 y.o. female with oncology history reviewed by me today presents for 41-monthfollow-up. Patient reports feeling pretty well at baseline. She is currently on Eliquis 2.5 mg twice daily for maintenance.  She is doing well.  Denies any shortness of breath, chest pain. She continues to have some chronic lower extremity swelling Also chemotherapy-induced lower extremity neuropathy. + urinary incontinence. Review of Systems  Constitutional: Negative for chills, fever, malaise/fatigue and weight loss.  HENT: Negative for sore throat.   Eyes: Negative for redness.  Respiratory: Negative for cough, shortness of breath and wheezing.  Cardiovascular: Negative for chest pain, palpitations and leg swelling.  Gastrointestinal: Negative for abdominal  pain, blood in stool, nausea and vomiting.  Genitourinary: Negative for dysuria.  Musculoskeletal: Negative for myalgias.  Skin: Negative for rash.  Neurological: Positive for tingling. Negative for dizziness and tremors.  Endo/Heme/Allergies: Does not bruise/bleed easily.  Psychiatric/Behavioral: Negative for hallucinations.    MEDICAL HISTORY:  Past Medical History:  Diagnosis Date  . Allergy   . Anemia 07/20/2018  . Endometrial adenocarcinoma ([place]) 07/2018   with chemo and rad tx  . Fibroid uterus 07/24/2018   5cm/2 inch   . Ovarian mass 07/24/2018   22cm/10 inch  . Pelvic mass   . Personal history of chemotherapy 2019  . Personal history of radiation therapy 2020    SURGICAL HISTORY: Past Surgical History:  Procedure Laterality Date  . ABDOMINAL HYSTERECTOMY  10/2018  . COLONOSCOPY WITH PROPOFOL N/A 10/31/2019   Procedure: COLONOSCOPY WITH PROPOFOL;  Surgeon: Virgel Manifold, MD;  Location: [facility] ENDOSCOPY;  Service: Endoscopy;  Laterality: N/A;  . EYE SURGERY  2000   Lasik  . IVC FILTER REMOVAL Right 02/07/2019   Procedure: IVC FILTER REMOVAL;  Surgeon: Algernon Huxley, MD;  Location: [facility];  Service: Cardiovascular;  Laterality: Right;  . PORTA CATH INSERTION N/A 08/13/2018   Procedure: PORTA CATH INSERTION;  Surgeon: Algernon Huxley, MD;  Location: [place] [facility];  Service: Cardiovascular;  Laterality: N/A;  . Removal of IVC filter   02/07/2019   chemo completed     SOCIAL HISTORY: Social History   Socioeconomic History  . Marital status: Married    Spouse name: Heath Lark   . Number of children: 1  . Years of education: Not on file  . Highest education level: Not on file  Occupational History  . Not on file  Tobacco Use  . Smoking status: Never Smoker  . Smokeless tobacco: Never Used  Vaping Use  . Vaping Use: Never used  Substance and Sexual Activity  . Alcohol use: No  . Drug use: No  . Sexual activity: Not Currently    Birth  control/protection: Post-menopausal  Other Topics Concern  . Not on file  Social History Narrative  . Not on file   Social Determinants of Health   Financial Resource Strain:   . Difficulty of Paying Living Expenses:   Food Insecurity:   . Worried About Charity fundraiser in the Last Year:   . Arboriculturist in the Last Year:   Transportation Needs:   . Film/video editor (Medical):   Marland Kitchen Lack of Transportation (Non-Medical):   Physical Activity:   . Days of Exercise per Week:   . Minutes of Exercise per Session:   Stress:   . Feeling of Stress :   Social Connections:   . Frequency of Communication with Friends and Family:   . Frequency of Social Gatherings with Friends and Family:   . Attends Religious Services:   . Active Member of Clubs or Organizations:   . Attends Archivist Meetings:   Marland Kitchen Marital Status:   Intimate Partner Violence:   . Fear of Current or Ex-Partner:   . Emotionally Abused:   Marland Kitchen Physically Abused:   . Sexually Abused:     FAMILY HISTORY: Family History  Problem Relation Age of Onset  . Hypertension Father   . Ovarian cancer Mother 77       deceased 30  . Healthy Brother   .  Skin cancer Paternal Grandmother        deceased 71s  . Cancer Paternal Grandfather        unk. primary; deceased 67s  . Ovarian cancer Other        mat grandmother's sister    ALLERGIES:  has no active allergies.  MEDICATIONS:  Current Outpatient Medications  Medication Sig Dispense Refill  . ELIQUIS 2.5 MG TABS tablet TAKE 1 TABLET TWICE A DAY 180 tablet 3   No current facility-administered medications for this visit.     PHYSICAL EXAMINATION: ECOG PERFORMANCE STATUS: 1 - Symptomatic but completely ambulatory Vitals:   06/04/20 1031  BP: 136/85  Pulse: 79  Temp: (!) 97 F (36.1 C)  SpO2: 97%   Filed Weights   06/04/20 1031  Weight: 254 lb (115.2 kg)    Physical Exam Constitutional:      General: She is not in acute distress. HENT:      Head: Normocephalic and atraumatic.  Eyes:     General: No scleral icterus.    Pupils: Pupils are equal, round, and reactive to light.  Cardiovascular:     Rate and Rhythm: Normal rate and regular rhythm.     Heart sounds: Normal heart sounds.  Pulmonary:     Effort: Pulmonary effort is normal. No respiratory distress.     Breath sounds: Normal breath sounds. No wheezing.  Abdominal:     General: Bowel sounds are normal. There is no distension.     Palpations: Abdomen is soft. There is no mass.     Tenderness: There is no abdominal tenderness.  Musculoskeletal:        General: No swelling or deformity. Normal range of motion.     Cervical back: Normal range of motion and neck supple.  Skin:    General: Skin is warm and dry.     Findings: No erythema or rash.  Neurological:     Mental Status: She is alert and oriented to person, place, and time. Mental status is at baseline.     Cranial Nerves: No cranial nerve deficit.     Coordination: Coordination normal.  Psychiatric:        Mood and Affect: Mood normal.        Thought Content: Thought content normal.        LABORATORY DATA:  I have reviewed the data as listed Lab Results  Component Value Date   WBC 4.6 06/04/2020   HGB 12.6 06/04/2020   HCT 37.8 06/04/2020   MCV 88.1 06/04/2020   PLT 226 06/04/2020   Recent Labs    07/23/19 1310 07/23/19 1310 07/23/19 1354 09/09/19 0825 01/20/20 1050 06/04/20 1011  NA 139  --   --   --  139 142  K 3.9  --   --   --  3.8 4.0  CL 105  --   --   --  106 109  CO2 26  --   --   --  25 26  GLUCOSE 90  --   --   --  98 105*  BUN 12  --   --   --  15 16  CREATININE 0.74   < >  --  0.90 0.91 0.73  CALCIUM 9.2  --   --   --  9.3 8.9  GFRNONAA >60  --   --   --  >60 >60  GFRAA >60  --   --   --  >60 >60  PROT 7.4  --   --   --  7.1 6.9  ALBUMIN 4.3  --   --   --  4.3 4.0  AST 14*  --   --   --  16 16  ALT 13  --   --   --  16 15  ALKPHOS 59  --   --   --  67 71  BILITOT  1.3*  --   --   --  0.9 0.9  BILIDIR  --   --  0.2  --   --   --    < > = values in this interval not displayed.   Iron/TIBC/Ferritin/ %Sat    Component Value Date/Time   IRON 25 (L) 09/05/2018 0819   IRON 11 (L) 07/17/2018 1124   TIBC 245 (L) 09/05/2018 0819   TIBC 312 07/17/2018 1124   FERRITIN 323 (H) 09/05/2018 0819   FERRITIN 60 07/17/2018 1124   IRONPCTSAT 10 (L) 09/05/2018 0819   IRONPCTSAT 4 (LL) 07/17/2018 1124    RADIOGRAPHIC STUDIES: I have personally reviewed the radiological images as listed and agreed with the findings in the report. 08/24/1999 nineteen 2D echo showed LVEF 55 to 60%. 08/30/2018 CXR Interval reduction/resolution of right-sided pleural effusion post thoracentesis. No pneumothorax.  09/23/2018, [place] lower extremity venous bilaterally positive for bilateral DVT CT chest PE protocol showed bilateral pulmonary emboli, most centrally in the bilateral main pulmonary arteries.  There are associated with signs suggestive of right heart strain.  No evidence of pulmonary infarct.  Moderate right pleural effusion.  Partially visualized left hydronephrosis also present on prior CT.   ASSESSMENT & PLAN:  1. History of deep vein thrombosis (DVT) of lower extremity   2. Endometrial adenocarcinoma ([place])   3. Port-A-Cath in place   4. Bilateral pulmonary embolism ([place])   5. Chronic anticoagulation   6. Post-thrombotic syndrome   7. Neuropathy due to chemotherapeutic drug ([place])     # FIGO grade 3 endometrial adenocarcinoma, pT3a Nx pM1 11/21/2018, post surgery CA125 58.3 Labs reviewed and discussed with patient.  CA-125 has been followed. Today's level is pending. Patient was last seen by GYN oncology in March 2021. Patient is clinically doing well.  Continue follow-up with GYN oncology  #Port-A-Cath in place, continue port flush every 6 to 8 weeks. Discussed about probably keep it until 2 years after finishing all treatment.   #History of bilateral PE and bilateral  lower extremity DVT.  Currently on Eliquis 2.5 mg for maintenance. His DVT is likely provoked due to active cancer at that time. Now she has been in remission.  Recommend her to discontinue anticoagulation.  Post thrombotic syndrome, mild lower extremity swelling.  Recommend leg elevation.  Compression stocking Incontinence, I recommend patient to pelvic floor physical therapy Chemotherapy-induced neuropathy, referred to acupuncture clinic.   Orders Placed This Encounter  Procedures  . CBC with Differential/Platelet    Standing Status:   Future    Standing Expiration Date:   06/04/2021  . Comprehensive metabolic panel    Standing Status:   Future    Standing Expiration Date:   06/04/2021  . CA 125    Standing Status:   Future    Standing Expiration Date:   06/04/2021  . Ambulatory referral to Physical Therapy    Referral Priority:   Routine    Referral Type:   Physical Medicine    Referral Reason:   Specialty Services Required    Requested Specialty:   Physical Therapy    Number of Visits Requested:   1  Return of visit: 6 months.    Earlie Server, MD, PhD Hematology Oncology [facility] at [facility] Pager- [phone] 06/04/2020

## 2020-06-05 LAB — CA 125: Cancer Antigen (CA) 125: 5.8 U/mL (ref 0.0–38.1)

## 2020-06-09 ENCOUNTER — Telehealth: Payer: Self-pay

## 2020-06-09 NOTE — Telephone Encounter (Signed)
Received message from Ms. Marshburn to confirm she will stop Eliquis and begin Aspirin 81mg  daily. Dr. Tasia Catchings made aware.

## 2020-06-18 ENCOUNTER — Other Ambulatory Visit: Payer: Self-pay

## 2020-06-18 ENCOUNTER — Ambulatory Visit: Payer: Self-pay | Admitting: Oncology

## 2020-06-24 ENCOUNTER — Other Ambulatory Visit: Payer: 59

## 2020-06-24 ENCOUNTER — Ambulatory Visit: Payer: 59

## 2020-07-13 ENCOUNTER — Other Ambulatory Visit: Payer: Self-pay

## 2020-07-15 ENCOUNTER — Other Ambulatory Visit: Payer: Self-pay

## 2020-07-15 ENCOUNTER — Ambulatory Visit: Payer: Self-pay | Admitting: Oncology

## 2020-07-17 ENCOUNTER — Other Ambulatory Visit: Payer: Self-pay

## 2020-07-20 ENCOUNTER — Ambulatory Visit: Payer: Self-pay | Admitting: Oncology

## 2020-07-22 ENCOUNTER — Ambulatory Visit: Payer: 59 | Attending: Oncology | Admitting: Physical Therapy

## 2020-07-22 ENCOUNTER — Other Ambulatory Visit: Payer: Self-pay

## 2020-07-22 ENCOUNTER — Inpatient Hospital Stay: Payer: 59 | Attending: Oncology

## 2020-07-22 DIAGNOSIS — R278 Other lack of coordination: Secondary | ICD-10-CM | POA: Insufficient documentation

## 2020-07-22 DIAGNOSIS — M6281 Muscle weakness (generalized): Secondary | ICD-10-CM | POA: Diagnosis present

## 2020-07-22 DIAGNOSIS — Z86718 Personal history of other venous thrombosis and embolism: Secondary | ICD-10-CM | POA: Diagnosis not present

## 2020-07-22 DIAGNOSIS — Z8542 Personal history of malignant neoplasm of other parts of uterus: Secondary | ICD-10-CM | POA: Diagnosis present

## 2020-07-22 DIAGNOSIS — Z452 Encounter for adjustment and management of vascular access device: Secondary | ICD-10-CM | POA: Diagnosis not present

## 2020-07-22 DIAGNOSIS — R2689 Other abnormalities of gait and mobility: Secondary | ICD-10-CM | POA: Insufficient documentation

## 2020-07-22 DIAGNOSIS — Z95828 Presence of other vascular implants and grafts: Secondary | ICD-10-CM

## 2020-07-22 MED ORDER — SODIUM CHLORIDE 0.9% FLUSH
10.0000 mL | Freq: Once | INTRAVENOUS | Status: AC
  Administered 2020-07-22: 10 mL via INTRAVENOUS
  Filled 2020-07-22: qty 10

## 2020-07-22 MED ORDER — HEPARIN SOD (PORK) LOCK FLUSH 100 UNIT/ML IV SOLN
500.0000 [IU] | Freq: Once | INTRAVENOUS | Status: AC
  Administered 2020-07-22: 500 [IU] via INTRAVENOUS
  Filled 2020-07-22: qty 5

## 2020-07-22 NOTE — Patient Instructions (Signed)
Lengthen Back rib by _ shoulder    Lie on   side , pillow between knees  Pull  arm overhead over mattress, grab the edge of mattress,pull it upward, drawing elbow away from ears  Breathing   Open book (handout)  Lying on  _ side , rotating  __ only this week    ___   Proper body mechanics with getting out of a chair to decrease strain  on back &pelvic floor   Avoid holding your breath when Getting out of the chair:  Scoot to front part of chair chair Heels behind feet, feet are hip width apart, nose over toes  Inhale like you are smelling roses Exhale to stand    ___

## 2020-07-23 ENCOUNTER — Inpatient Hospital Stay: Payer: 59

## 2020-07-23 NOTE — Therapy (Addendum)
[place] MAIN [facility] SERVICES [address] [place], [place], [address] Phone: [phone]   Fax:  [phone]  Physical Therapy Evaluation  Patient Details  Name: Karen Mann MRN: [id number] Date of Birth: 1961-03-21 Referring Provider (PT): Laqueta Jean Date: 07/22/2020    Past Medical History:  Diagnosis Date  . Allergy   . Anemia 07/20/2018  . Endometrial adenocarcinoma ([place]) 07/2018   with chemo and rad tx  . Fibroid uterus 07/24/2018   5cm/2 inch   . Ovarian mass 07/24/2018   22cm/10 inch  . Pelvic mass   . Personal history of chemotherapy 2019  . Personal history of radiation therapy 2020    Past Surgical History:  Procedure Laterality Date  . ABDOMINAL HYSTERECTOMY  10/2018  . COLONOSCOPY WITH PROPOFOL N/A 10/31/2019   Procedure: COLONOSCOPY WITH PROPOFOL;  Surgeon: Virgel Manifold, MD;  Location: [facility] ENDOSCOPY;  Service: Endoscopy;  Laterality: N/A;  . EYE SURGERY  2000   Lasik  . IVC FILTER REMOVAL Right 02/07/2019   Procedure: IVC FILTER REMOVAL;  Surgeon: Algernon Huxley, MD;  Location: [place] [facility];  Service: Cardiovascular;  Laterality: Right;  . PORTA CATH INSERTION N/A 08/13/2018   Procedure: PORTA CATH INSERTION;  Surgeon: Algernon Huxley, MD;  Location: [place] [facility];  Service: Cardiovascular;  Laterality: N/A;  . Removal of IVC filter   02/07/2019   chemo completed     There were no vitals filed for this visit.    Subjective Assessment - 07/22/20 0910    Subjective 1) R thigh pain started in 2016  located in the top of  thigh 5-6/10 average, 7/10 at its worst with movement ( stepping on it, lifting it) or stand for 10-15 min and pt will need to sit down. Upon sitting, pain subsides quickly and tired after getting back up. Sometimes at night when sleeping on her side,  it radiates as an ache to side of the thigh and calf. Pt has not been sleeping with a pillow between her knees. Denied radiating  pain with walking. Pt "does not trust her R leg". Pt has tripped a few tiems but has not fallen.  During this time when the pain started, pt was working with a trainer 2-3 x week and had been using too much weights now that she is looking back. Pt had been working with a trainer for 8 years. After getting cancer in 2019, pt stopped goping to the gym and working with a trainer.  Pt is not exercising. Walking is limited due to the pain. Since getting cancer and undergoing surgeries, R thigh pain has increased.    2) CLBP started off and on for 10 years. Pt had lost weight 100 lbs through working out several times within a day. Pt kept off the weight until 2018, a year before she got cancer (R lung filled with fluids). Pt is able to exericise if she could move without pain.   It occurs on and off standing 10 mins, walking 20 min and is located in the middle of the low back. Nonradiating.    3) Fatigue: pt gets winded  with walking from parking lot to hsopital on 1st floor, climbing up stairs with landry basket.   4) Urinary issues: SUI, urgency   Denied pelvic pain, vaginal pain.     Pertinent History Stage III uterine cancer and Tx includes removal of uterus surgrically, 6 chemotherapy Tx, 25 radiation to abdomen,  6 internal radiation Tx.  Pt reports being in remission.    Patient Stated Goals Pt would like to not be asked if she nedes a WC, and have a normal gait and play golf                [facility] PT Assessment - 07/22/20 0938      Assessment   Referring Provider (PT) Tasia Catchings      Precautions   Precautions None      Restrictions   Weight Bearing Restrictions No      Balance Screen   Has the patient fallen in the past 6 months No      AROM   Overall AROM Comments L sidelying, R hip ext -20 deg.  R knee ext lacking  ( 140 deg flexion in supine), L 180 deg)       Palpation   Patella mobility L patella higher than R    Spinal mobility limited thoracic mobilty     SI assessment  L iliac  crest higher than R, shoulders levelled.     Palpation comment R FADDIR limited 0 deg  ( post Tx:                         Objective measurements completed on examination: See above findings.       [facility] Adult PT Treatment/Exercise - 07/28/20 1502      Ambulation/Gait   Gait velocity 1.09 m/s     Gait Comments hip hike R, anterior rotation of R pelvis       Neuro Re-ed    Neuro Re-ed Details  cued for HEP, proper sit to stand for improve pelvic stability       Manual Therapy   Manual therapy comments STM/MWM to promote spinal mobility at thoracic                        PT Long Term Goals - 07/28/20 1449      PT LONG TERM GOAL #1   Title Pt will increase her walking speed from 1.09 m/s to > 1.13 m/s in order to ambulate safely in community and regain "trust in her R leg"    Time 4    Period Weeks    Status New    Target Date 08/18/20      PT LONG TERM GOAL #2   Title Pt will increase R knee ext from 140 deg to > 160 deg  and R hip ext -20 deg to > 0 deg in order to improve gait and minimize back/ hip pain    Baseline R hip ext -20 deg.  R knee ext lacking ( 140 deg flexion in supine), L 180 deg)    Time 6    Period Weeks    Status New    Target Date 09/08/20      PT LONG TERM GOAL #3   Title Pt will demo equal iliac crest height and increased SIJ mobility across 2 visits in order to progress to deep core / thoracolumabar/ hip strengthening exercises with greater outcomes    Time 2    Period Weeks    Status New    Target Date 08/11/20      PT LONG TERM GOAL #4   Title Pt will demo increased FOTO score from 42 pts to > 40 pts in order to demo improved functional mobility    Time 10  Period Weeks    Status New    Target Date 10/06/20      PT LONG TERM GOAL #5   Title Pt will report no R hip pain when stepping, lifting R LE, and getting up after sitting in order to increase functional mobility in ADLs and community    Time 10    Period  Weeks    Status New    Target Date 10/06/20      Additional Long Term Goals   Additional Long Term Goals Yes      PT LONG TERM GOAL #6   Title Pt will demo proper pelvic floor coordination and report decreased SUI by 50% in order to promote pelvic floor function    Time 8    Period Weeks    Status New    Target Date 09/22/20               Plan - 07/23/20 1648    Clinical Impression Statement Pt is a 59 yo patient who reports R chronic thigh pain, CLBP, urinary leakage, and fatigue. These deficits impact her gait and ADLs.   The R chronic thigh pain started in 2016 while pt was working with a fitness trainer and pt thinks in hindsight that she was using too much weight in her routine. Pt's CLBP started without injury in 2010. Pt had lost weight 100 lbs with gym workouts. Pt kept off the weight until 2018, a year before she got cancer. Pt's fatigue levels are impacted by Hx stage III uterine cancer 2019 and Tx includes removal of uterus surgrically, 6 chemotherapy Tx, 25 radiation to abdomen, 6 internal radiation Tx. Medical Records include: History of deep vein thrombosis of lower extremity.Endometrial adenocarcinoma. Bilateral pulmonary   Pt's musculoskeletal assessment revealed pelvic misalignment, limited R knee extension, unequal weight bearing in LE, limited spinal /pelvic mobility, dyscoordination and strength of pelvic floor mm, weak hip weakness, poor body mechanics which places strain on the abdominal/pelvic floor mm.  Addressing proper alignment / strength of BLE, pelvis, spine will yield to positive outcomes for functional mobility. Given Hx of hysterectomy and CA Tx to pelvic area, the need to promote a more effective intraabdominal pressure system will help further to address Sx for pelvic/spinal stability and pelvic floor function.  Pt will benefit from proper coordination training and education on fitness and functional positions in order to yield greater outcomes.    Pt was  provided education on etiology of Sx with anatomy, physiology explanation with images along with the benefits of customized pelvic PT Tx based on pt's medical conditions and musculoskeletal deficits.  Explained the physiology of deep core mm coordination and roles of pelvic floor function in urination, defecation, sexual function, and postural control with deep core mm system.    Biopsychosocial and regional interdependent approaches will yield greater benefits in pt's POC due to the complexity of her medical Hx and the significant impact their Sx have had on their QOL. Pt would benefit from a biopsychosocial approach to yield optimal outcomes. Plan to build interdisciplinary team with pt's providers to optimize patient-centered care.   Following Tx today which pt tolerated without complaints, pt demo'd equal alignment of pelvic girdle and increased spinal mobility. PLan to address decreased R knee extension at next session.      Personal Factors and Comorbidities Comorbidity 2    Stability/Clinical Decision Making Evolving/Moderate complexity    Clinical Decision Making High    Rehab Potential Good    PT  Frequency 2x / week    PT Duration Other (comment)   10   PT Treatment/Interventions Gait training;Balance training;Neuromuscular re-education;Moist Heat;Functional mobility training;Therapeutic activities;Therapeutic exercise;Manual techniques;Patient/family education;Scar mobilization;Cryotherapy;Taping;Splinting;Energy conservation    Consulted and Agree with Plan of Care Patient                Patient will benefit from skilled therapeutic intervention in order to improve the following deficits and impairments:  Decreased scar mobility, Decreased mobility, Decreased coordination, Abnormal gait, Improper body mechanics, Pain, Increased muscle spasms, Hypermobility, Decreased endurance, Decreased strength, Hypomobility, Decreased balance, Decreased activity tolerance, Decreased safety  awareness, Decreased knowledge of precautions, Increased fascial restricitons, Impaired sensation, Postural dysfunction, Difficulty walking, Decreased range of motion  Visit Diagnosis: Other abnormalities of gait and mobility  Muscle weakness (generalized)  Other lack of coordination     Problem List Patient Active Problem List   Diagnosis Date Noted  . Chronic anticoagulation 01/02/2019  . Encounter for antineoplastic chemotherapy 01/02/2019  . Neuropathy due to chemotherapeutic drug ([place]) 01/02/2019  . S/P insertion of IVC (inferior vena caval) filter 09/26/2018  . Bilateral pulmonary embolism ([place]) 09/22/2018  . DVT (deep venous thrombosis) ([place]) 09/22/2018  . Anxiety associated with cancer diagnosis 09/07/2018  . Recurrent pleural effusion on right 09/07/2018  . Chemotherapy-induced peripheral neuropathy ([place]) 09/07/2018  . Iron deficiency anemia due to chronic blood loss 08/10/2018  . Goals of care, counseling/discussion 08/10/2018  . Endometrial adenocarcinoma ([place]) 08/10/2018  . Arthritis 12/01/2015  . Secondary pulmonary hypertension 11/24/2015  . Allergic rhinitis 03/02/2010  . Varicose veins of lower extremities with inflammation 01/06/2010  . Chronic venous insufficiency 01/06/2010  . Nonrheumatic tricuspid valve disorder 12/30/2009  . Fibroid 12/09/2009  . Family history of malignant neoplasm of ovary 12/02/2009    Jerl Mina 07/28/2020, 3:04 PM  [place] MAIN [facility] SERVICES [address] [place], [place], [address] Phone: [phone]   Fax:  [phone]  Name: Karen Mann MRN: [id number] Date of Birth: 05/07/1961

## 2020-07-28 ENCOUNTER — Encounter: Payer: Self-pay | Admitting: Physical Therapy

## 2020-07-28 NOTE — Addendum Note (Signed)
Addended by: Jerl Mina on: 07/28/2020 04:33 PM   Modules accepted: Orders

## 2020-07-29 ENCOUNTER — Other Ambulatory Visit: Payer: Self-pay

## 2020-07-29 ENCOUNTER — Ambulatory Visit: Payer: 59 | Attending: Oncology | Admitting: Physical Therapy

## 2020-07-29 DIAGNOSIS — R2689 Other abnormalities of gait and mobility: Secondary | ICD-10-CM | POA: Diagnosis not present

## 2020-07-29 DIAGNOSIS — M533 Sacrococcygeal disorders, not elsewhere classified: Secondary | ICD-10-CM | POA: Insufficient documentation

## 2020-07-29 DIAGNOSIS — M6281 Muscle weakness (generalized): Secondary | ICD-10-CM

## 2020-07-29 DIAGNOSIS — R278 Other lack of coordination: Secondary | ICD-10-CM | POA: Diagnosis present

## 2020-07-29 NOTE — Patient Instructions (Signed)
Feet on the ground  Breathe -notice soft sound, diaphragm expands, not up in chest   ___     During the day:  kitchen counter stretches  Hands on kitchen counter,   Palms shoulder width apart  Minisquat postion Trunk is parallel to floor  A) Pull buttocks back to lengthen spine, knees bent  3 breaths   B) Bring R hand to the L, and stretch the R side trunk  3 breaths   Brings hands to center again Do the same to the L side stretch by placing L hand on top of R   D) Modified thread the needle R hand on L thigh, L  thigh pushing out slightly as the R hands pull in,  elbow bent and pulls to theR,  Look under L armpit   Do the same to other side        ____  Neck / shoulder stretches:    Lying on back - small sushi roll towel under neck  _ 6 directions  5 reps  __angel wings, lower elbows down , keep arms touching bed  10 reps

## 2020-07-29 NOTE — Therapy (Signed)
[place] MAIN [facility] SERVICES [address] [place], [place], [address] Phone: [phone]   Fax:  [phone]  Physical Therapy Treatment  Patient Details  Name: Karen Mann MRN: [id number] Date of Birth: 07-Nov-1961 Referring Provider (PT): Laqueta Jean Date: 07/29/2020   PT End of Session - 07/29/20 1103    Visit Number 2    Number of Visits 10    Date for PT Re-Evaluation 09/08/20    PT Start Time 0905    PT Stop Time 6270    PT Time Calculation (min) 100 min           Past Medical History:  Diagnosis Date  . Allergy   . Anemia 07/20/2018  . Endometrial adenocarcinoma ([place]) 07/2018   with chemo and rad tx  . Fibroid uterus 07/24/2018   5cm/2 inch   . Ovarian mass 07/24/2018   22cm/10 inch  . Pelvic mass   . Personal history of chemotherapy 2019  . Personal history of radiation therapy 2020    Past Surgical History:  Procedure Laterality Date  . ABDOMINAL HYSTERECTOMY  10/2018  . COLONOSCOPY WITH PROPOFOL N/A 10/31/2019   Procedure: COLONOSCOPY WITH PROPOFOL;  Surgeon: Virgel Manifold, MD;  Location: [facility] ENDOSCOPY;  Service: Endoscopy;  Laterality: N/A;  . EYE SURGERY  2000   Lasik  . IVC FILTER REMOVAL Right 02/07/2019   Procedure: IVC FILTER REMOVAL;  Surgeon: Algernon Huxley, MD;  Location: [place] [facility];  Service: Cardiovascular;  Laterality: Right;  . PORTA CATH INSERTION N/A 08/13/2018   Procedure: PORTA CATH INSERTION;  Surgeon: Algernon Huxley, MD;  Location: [place] [facility];  Service: Cardiovascular;  Laterality: N/A;  . Removal of IVC filter   02/07/2019   chemo completed     There were no vitals filed for this visit.   Subjective Assessment - 07/29/20 0914    Subjective Pt has been doing her exercises every day except for one day. Pt is paying attention on how she gets up and putting 50/50% on both legs. Pt is using a pillow between knees.    Pertinent History Stage III uterine cancer and Tx  includes removal of uterus surgrically, 6 chemotherapy Tx, 25 radiation to abdomen, 6 internal radiation Tx. Medical Records include: History of deep vein thrombosis of lower extremity.Endometrial adenocarcinoma. Bilateral pulmonary embolism    Patient Stated Goals Pt would like to not be asked if she needs a WC, and have a normal gait and play golf              [facility] PT Assessment - 07/29/20 0915      AROM   Overall AROM Comments standing hip ext R 15 deg, L 20 deg       Palpation   Spinal mobility hypomobile thoracic/ L thoracic posterior     Palpation comment increased tightness along L upper quadrant, cervical mm B       Ambulation/Gait   Gait Comments less R hip hike , decreased L pelvis posterior rotation, limited R hip ext, minimal trunk rotation/ armswings                          [facility] Adult PT Treatment/Exercise - 07/29/20 0915      Ambulation/Gait   Gait velocity 1.25 m/s       Therapeutic Activites    Therapeutic Activities --   explained role of posture, trunk rotation to  help with R hip     Neuro Re-ed    Neuro Re-ed Details  cued for diaphragmatic excursion, postural alignment , thoracic rotation in gait       Modalities   Modalities Moist Heat      Moist Heat Therapy   Number Minutes Moist Heat 5 Minutes    Moist Heat Location --   thoracic/ cervical     Manual Therapy   Manual therapy comments STM/MWM  at L mdeial scap/ intercostal mm posterior, cervical mm                         PT Long Term Goals - 07/28/20 1449      PT LONG TERM GOAL #1   Title Pt will increase her walking speed from 1.09 m/s to > 1.13 m/s in order to ambulate safely in community and regain "trust in her R leg"    Time 4    Period Weeks    Status New    Target Date 08/18/20      PT LONG TERM GOAL #2   Title Pt will increase R knee ext from 140 deg to > 160 deg  and R hip ext -20 deg to > 0 deg in order to improve gait and minimize back/ hip pain      Baseline R hip ext -20 deg.  R knee ext lacking ( 140 deg flexion in supine), L 180 deg)    Time 6    Period Weeks    Status New    Target Date 09/08/20      PT LONG TERM GOAL #3   Title Pt will demo equal iliac crest height and increased SIJ mobility across 2 visits in order to progress to deep core / thoracolumabar/ hip strengthening exercises with greater outcomes    Time 2    Period Weeks    Status New    Target Date 08/11/20      PT LONG TERM GOAL #4   Title Pt will demo increased FOTO score from 42 pts to > 40 pts in order to demo improved functional mobility    Time 10    Period Weeks    Status New    Target Date 10/06/20      PT LONG TERM GOAL #5   Title Pt will report no R hip pain when stepping, lifting R LE, and getting up after sitting in order to increase functional mobility in ADLs and community    Time 10    Period Weeks    Status New    Target Date 10/06/20      Additional Long Term Goals   Additional Long Term Goals Yes      PT LONG TERM GOAL #6   Title Pt will demo proper pelvic floor coordination and report decreased SUI by 50% in order to promote pelvic floor function    Time 8    Period Weeks    Status New    Target Date 09/22/20                 Plan - 07/29/20 1242    Clinical Impression Statement  Pt required manual Tx to minimize L upper quadrant and cervical mm tightness and increase thoracic extension. Pt demo'd less L posterior rotation of thorax which will help improve pt's restricted R pelvic rotation in gait. Plan to address limited R knee extension and anticipate this will help with the reciprocal  pattern between thorax and pelvis. R hip abduction/ ER remains restricted and plan to assess posterior pelvic floor mm at next session as well.   Pt required cues for more scapular/ cervical retraction/ depression for less forward head posture in gait and seated posture. Pt continues to benefit from skilled PT   Pt's session was extended  longer today due to opening in therapist's schedule which helped to make up for not being able to schedule pt 2x this week.    Personal Factors and Comorbidities Comorbidity 2    Comorbidities Stage III uterine cancer and Tx includes removal of uterus surgrically, 6 chemotherapy Tx, 25 radiation to abdomen, 6 internal radiation Tx. Medical Records include: History of deep vein thrombosis of lower extremity.Endometrial adenocarcinoma. Bilateral pulmonary embolism    Stability/Clinical Decision Making Evolving/Moderate complexity    Rehab Potential Good    PT Frequency 2x / week    PT Duration Other (comment)   10   PT Treatment/Interventions Gait training;Balance training;Neuromuscular re-education;Moist Heat;Functional mobility training;Therapeutic activities;Therapeutic exercise;Manual techniques;Patient/family education;Scar mobilization;Cryotherapy;Taping;Splinting;Energy conservation    Consulted and Agree with Plan of Care Patient           Patient will benefit from skilled therapeutic intervention in order to improve the following deficits and impairments:  Decreased scar mobility, Decreased mobility, Decreased coordination, Abnormal gait, Improper body mechanics, Pain, Increased muscle spasms, Hypermobility, Decreased endurance, Decreased strength, Hypomobility, Decreased balance, Decreased activity tolerance, Decreased safety awareness, Decreased knowledge of precautions, Increased fascial restricitons, Impaired sensation, Postural dysfunction, Difficulty walking, Decreased range of motion  Visit Diagnosis: Other abnormalities of gait and mobility  Muscle weakness (generalized)  Other lack of coordination     Problem List Patient Active Problem List   Diagnosis Date Noted  . Chronic anticoagulation 01/02/2019  . Encounter for antineoplastic chemotherapy 01/02/2019  . Neuropathy due to chemotherapeutic drug ([place]) 01/02/2019  . S/P insertion of IVC (inferior vena caval) filter  09/26/2018  . Bilateral pulmonary embolism ([place]) 09/22/2018  . DVT (deep venous thrombosis) ([place]) 09/22/2018  . Anxiety associated with cancer diagnosis 09/07/2018  . Recurrent pleural effusion on right 09/07/2018  . Chemotherapy-induced peripheral neuropathy ([place]) 09/07/2018  . Iron deficiency anemia due to chronic blood loss 08/10/2018  . Goals of care, counseling/discussion 08/10/2018  . Endometrial adenocarcinoma ([place]) 08/10/2018  . Arthritis 12/01/2015  . Secondary pulmonary hypertension 11/24/2015  . Allergic rhinitis 03/02/2010  . Varicose veins of lower extremities with inflammation 01/06/2010  . Chronic venous insufficiency 01/06/2010  . Nonrheumatic tricuspid valve disorder 12/30/2009  . Fibroid 12/09/2009  . Family history of malignant neoplasm of ovary 12/02/2009    Jerl Mina ,PT, DPT, E-RYT  07/29/2020, 12:49 PM  [place] MAIN [facility] SERVICES [address] [place], [place], [address] Phone: [phone]   Fax:  [phone]  Name: Karen Mann MRN: [id number] Date of Birth: [birth year]

## 2020-07-31 ENCOUNTER — Ambulatory Visit: Payer: 59 | Admitting: Physical Therapy

## 2020-07-31 ENCOUNTER — Other Ambulatory Visit: Payer: Self-pay

## 2020-07-31 DIAGNOSIS — R278 Other lack of coordination: Secondary | ICD-10-CM

## 2020-07-31 DIAGNOSIS — R2689 Other abnormalities of gait and mobility: Secondary | ICD-10-CM | POA: Diagnosis not present

## 2020-07-31 DIAGNOSIS — M6281 Muscle weakness (generalized): Secondary | ICD-10-CM

## 2020-07-31 NOTE — Patient Instructions (Addendum)
R knee to chest, wide knee toward armpit with a strap  Scoot hips to the R and pull strap and R knee towards L armpit    On belly: Riding horse edge of mattress  knee bent like riding a horse, move knee towards armpit and out  10 reps    Wide knee childs pose rocking on forearms  10 reps   ___   Walking with wider feet, higher knees

## 2020-07-31 NOTE — Therapy (Signed)
[place] MAIN [facility] SERVICES [address] [place], [place], [address] Phone: [phone]   Fax:  [phone]  Physical Therapy Treatment  Patient Details  Name: Karen Mann MRN: [id number] Date of Birth: [birth year] Referring Provider (PT): Laqueta Jean Date: 07/31/2020   PT End of Session - 07/31/20 1497    Visit Number 3    Number of Visits 10    Date for PT Re-Evaluation 09/08/20    PT Start Time 0904    PT Stop Time 1005    PT Time Calculation (min) 61 min           Past Medical History:  Diagnosis Date  . Allergy   . Anemia 07/20/2018  . Endometrial adenocarcinoma ([place]) 07/2018   with chemo and rad tx  . Fibroid uterus 07/24/2018   5cm/2 inch   . Ovarian mass 07/24/2018   22cm/10 inch  . Pelvic mass   . Personal history of chemotherapy 2019  . Personal history of radiation therapy 2020    Past Surgical History:  Procedure Laterality Date  . ABDOMINAL HYSTERECTOMY  10/2018  . COLONOSCOPY WITH PROPOFOL N/A 10/31/2019   Procedure: COLONOSCOPY WITH PROPOFOL;  Surgeon: Virgel Manifold, MD;  Location: [facility] ENDOSCOPY;  Service: Endoscopy;  Laterality: N/A;  . EYE SURGERY  2000   Lasik  . IVC FILTER REMOVAL Right 02/07/2019   Procedure: IVC FILTER REMOVAL;  Surgeon: Algernon Huxley, MD;  Location: [place] [facility];  Service: Cardiovascular;  Laterality: Right;  . PORTA CATH INSERTION N/A 08/13/2018   Procedure: PORTA CATH INSERTION;  Surgeon: Algernon Huxley, MD;  Location: [place] [facility];  Service: Cardiovascular;  Laterality: N/A;  . Removal of IVC filter   02/07/2019   chemo completed     There were no vitals filed for this visit.   Subjective Assessment - 07/31/20 1037    Subjective Pt reported no complaints with last session    Pertinent History Stage III uterine cancer and Tx includes removal of uterus surgrically, 6 chemotherapy Tx, 25 radiation to abdomen, 6 internal radiation Tx. Medical Records  include: History of deep vein thrombosis of lower extremity.Endometrial adenocarcinoma. Bilateral pulmonary embolism    Patient Stated Goals Pt would like to not be asked if she needs a WC, and have a normal gait and play golf              [facility] PT Assessment - 07/31/20 1029      Palpation   Spinal mobility R thoracic hypomobility     SI assessment  severely limited SIJ mobilty R , coccyx slightly deviated    Palpation comment adductor, coccygeus R limited in mobility       Ambulation/Gait   Gait Comments pre Tx: more trunk rotation, antalgic gait still present,  ( post Tx with cues: wider feet, longer stride, less antalgic gait. more weight beraing anf push off in R LE)                          [facility] Adult PT Treatment/Exercise - 07/31/20 1029      Therapeutic Activites    Therapeutic Activities --   gait training     Neuro Re-ed    Neuro Re-ed Details  cued for stretches techniques       Modalities   Modalities Moist Heat      Moist Heat Therapy   Number Minutes Moist Heat  5 Minutes    Moist Heat Location --   in prone to promote more hip extension     Manual Therapy   Manual therapy comments Thoracic:  R PA mob / STM/MWM medial scapular,   SIJ : rotational mob, STM/MWM  adductor, coccygeus, R,  superior mob sacrum to promote nutation                       PT Long Term Goals - 07/28/20 1449      PT LONG TERM GOAL #1   Title Pt will increase her walking speed from 1.09 m/s to > 1.13 m/s in order to ambulate safely in community and regain "trust in her R leg"    Time 4    Period Weeks    Status New    Target Date 08/18/20      PT LONG TERM GOAL #2   Title Pt will increase R knee ext from 140 deg to > 160 deg  and R hip ext -20 deg to > 0 deg in order to improve gait and minimize back/ hip pain    Baseline R hip ext -20 deg.  R knee ext lacking ( 140 deg flexion in supine), L 180 deg)    Time 6    Period Weeks    Status New    Target  Date 09/08/20      PT LONG TERM GOAL #3   Title Pt will demo equal iliac crest height and increased SIJ mobility across 2 visits in order to progress to deep core / thoracolumabar/ hip strengthening exercises with greater outcomes    Time 2    Period Weeks    Status New    Target Date 08/11/20      PT LONG TERM GOAL #4   Title Pt will demo increased FOTO score from 42 pts to > 40 pts in order to demo improved functional mobility    Time 10    Period Weeks    Status New    Target Date 10/06/20      PT LONG TERM GOAL #5   Title Pt will report no R hip pain when stepping, lifting R LE, and getting up after sitting in order to increase functional mobility in ADLs and community    Time 10    Period Weeks    Status New    Target Date 10/06/20      Additional Long Term Goals   Additional Long Term Goals Yes      PT LONG TERM GOAL #6   Title Pt will demo proper pelvic floor coordination and report decreased SUI by 50% in order to promote pelvic floor function    Time 8    Period Weeks    Status New    Target Date 09/22/20                 Plan - 07/31/20 1033    Clinical Impression Statement Pt demo'd increased trunk rotation in gait which is good carry over from last session. Manual Tx helped to increase nutation of scaru mon R, SIJ mobility, adducto/ cocgeus mobility which in turn improved gait ( longer stride, wider BOS, stronger push off on R LE). Pt required excessive cues for gait to minimize antalgic gait and decreased weight bearing on RLE. Anticipate pt's R knee extension will improve as hip extension improves which will facilitate more effective pre swing/ pushoff in gait. Post Tx: pt reported the  medial thigh pain on R eased off.   Pt continues to benefit from skilled PT.    Personal Factors and Comorbidities Comorbidity 2    Comorbidities Stage III uterine cancer and Tx includes removal of uterus surgrically, 6 chemotherapy Tx, 25 radiation to abdomen, 6 internal  radiation Tx. Medical Records include: History of deep vein thrombosis of lower extremity.Endometrial adenocarcinoma. Bilateral pulmonary embolism    Stability/Clinical Decision Making Evolving/Moderate complexity    Rehab Potential Good    PT Frequency 2x / week    PT Duration Other (comment)   10   PT Treatment/Interventions Gait training;Balance training;Neuromuscular re-education;Moist Heat;Functional mobility training;Therapeutic activities;Therapeutic exercise;Manual techniques;Patient/family education;Scar mobilization;Cryotherapy;Taping;Splinting;Energy conservation    Consulted and Agree with Plan of Care Patient           Patient will benefit from skilled therapeutic intervention in order to improve the following deficits and impairments:  Decreased scar mobility, Decreased mobility, Decreased coordination, Abnormal gait, Improper body mechanics, Pain, Increased muscle spasms, Hypermobility, Decreased endurance, Decreased strength, Hypomobility, Decreased balance, Decreased activity tolerance, Decreased safety awareness, Decreased knowledge of precautions, Increased fascial restricitons, Impaired sensation, Postural dysfunction, Difficulty walking, Decreased range of motion  Visit Diagnosis: No diagnosis found.     Problem List Patient Active Problem List   Diagnosis Date Noted  . Chronic anticoagulation 01/02/2019  . Encounter for antineoplastic chemotherapy 01/02/2019  . Neuropathy due to chemotherapeutic drug ([place]) 01/02/2019  . S/P insertion of IVC (inferior vena caval) filter 09/26/2018  . Bilateral pulmonary embolism ([place]) 09/22/2018  . DVT (deep venous thrombosis) ([place]) 09/22/2018  . Anxiety associated with cancer diagnosis 09/07/2018  . Recurrent pleural effusion on right 09/07/2018  . Chemotherapy-induced peripheral neuropathy ([place]) 09/07/2018  . Iron deficiency anemia due to chronic blood loss 08/10/2018  . Goals of care, counseling/discussion 08/10/2018  .  Endometrial adenocarcinoma ([place]) 08/10/2018  . Arthritis 12/01/2015  . Secondary pulmonary hypertension 11/24/2015  . Allergic rhinitis 03/02/2010  . Varicose veins of lower extremities with inflammation 01/06/2010  . Chronic venous insufficiency 01/06/2010  . Nonrheumatic tricuspid valve disorder 12/30/2009  . Fibroid 12/09/2009  . Family history of malignant neoplasm of ovary 12/02/2009    Jerl Mina ,PT, DPT, E-RYT  07/31/2020, 10:38 AM  [place] MAIN [facility] SERVICES [address] [place], [place], [address] Phone: [phone]   Fax:  [phone]  Name: ZIYA COONROD MRN: [id number] Date of Birth: August 11, 1961

## 2020-08-05 ENCOUNTER — Telehealth: Payer: Self-pay | Admitting: Oncology

## 2020-08-05 NOTE — Telephone Encounter (Signed)
Patient phoned on this date and stated that she has a lab scheduled on 08-26-20 and a port flush only on 09-10-20. Patient requested to change 08-26-20 to port flush with lab and cancel 09-10-20. Appts changed per patient's request.

## 2020-08-06 ENCOUNTER — Other Ambulatory Visit: Payer: Self-pay

## 2020-08-06 ENCOUNTER — Ambulatory Visit: Payer: 59 | Admitting: Physical Therapy

## 2020-08-06 DIAGNOSIS — R2689 Other abnormalities of gait and mobility: Secondary | ICD-10-CM

## 2020-08-06 DIAGNOSIS — M6281 Muscle weakness (generalized): Secondary | ICD-10-CM

## 2020-08-06 DIAGNOSIS — R278 Other lack of coordination: Secondary | ICD-10-CM

## 2020-08-06 NOTE — Therapy (Signed)
[place] MAIN [facility] SERVICES [address] [place], [place], [address] Phone: [phone]   Fax:  [phone]  Physical Therapy Treatment  Patient Details  Name: Karen Mann MRN: [id number] Date of Birth: 17-Apr-1961 Referring Provider (PT): Laqueta Jean Date: 08/06/2020   PT End of Session - 08/06/20 1159    Visit Number 4    Number of Visits 10    Date for PT Re-Evaluation 09/08/20    PT Start Time 1103    PT Stop Time 1200    PT Time Calculation (min) 57 min           Past Medical History:  Diagnosis Date  . Allergy   . Anemia 07/20/2018  . Endometrial adenocarcinoma ([place]) 07/2018   with chemo and rad tx  . Fibroid uterus 07/24/2018   5cm/2 inch   . Ovarian mass 07/24/2018   22cm/10 inch  . Pelvic mass   . Personal history of chemotherapy 2019  . Personal history of radiation therapy 2020    Past Surgical History:  Procedure Laterality Date  . ABDOMINAL HYSTERECTOMY  10/2018  . COLONOSCOPY WITH PROPOFOL N/A 10/31/2019   Procedure: COLONOSCOPY WITH PROPOFOL;  Surgeon: Virgel Manifold, MD;  Location: [facility] ENDOSCOPY;  Service: Endoscopy;  Laterality: N/A;  . EYE SURGERY  2000   Lasik  . IVC FILTER REMOVAL Right 02/07/2019   Procedure: IVC FILTER REMOVAL;  Surgeon: Algernon Huxley, MD;  Location: [place] [facility];  Service: Cardiovascular;  Laterality: Right;  . PORTA CATH INSERTION N/A 08/13/2018   Procedure: PORTA CATH INSERTION;  Surgeon: Algernon Huxley, MD;  Location: [place] [facility];  Service: Cardiovascular;  Laterality: N/A;  . Removal of IVC filter   02/07/2019   chemo completed     There were no vitals filed for this visit.   Subjective Assessment - 08/06/20 1110    Subjective Pt reported soreness after last session for one day.The day after the session, pt was able to walk with almost no pain.  Then it started to tighten back up. Pt noticed when her feet are wider, there is less pain with walking. Pt  is able to walk from the parking lot to clinic , pt xperienced 4/10 compared to 6-7/10. When standing pt feels R low back pain    Pertinent History Stage III uterine cancer and Tx includes removal of uterus surgrically, 6 chemotherapy Tx, 25 radiation to abdomen, 6 internal radiation Tx. Medical Records include: History of deep vein thrombosis of lower extremity.Endometrial adenocarcinoma. Bilateral pulmonary embolism    Patient Stated Goals Pt would like to not be asked if she needs a WC, and have a normal gait and play golf              [facility] PT Assessment - 08/06/20 1110      AROM   Overall AROM Comments standing hip ext R 15 deg, sidelying L,  R quad flexibility -20 deg   Knee extension improved in supine     Palpation   Palpation comment R adductor, medial attachment to knee, R foot hypomobile.       Ambulation/Gait   Gait velocity 1.31 m/s post Tx    Gait Comments pre Tx: R hip hike, limited hip ext  ( postTx: more trunk rotation, hip ext )  [place] Adult PT Treatment/Exercise - 08/06/20 1110      Therapeutic Activites    Therapeutic Activities --   reassessments     Neuro Re-ed    Neuro Re-ed Details  cued for feet massage and quad stretch       Modalities   Modalities Moist Heat      Moist Heat Therapy   Number Minutes Moist Heat 5 Minutes    Moist Heat Location --   quad in prone to promote hip ext , pillows under thigh     Manual Therapy   Manual therapy comments STM/MWM long axis distraction R LE:  adductor/ intrinsic foot mm, AP/PA mob midfoot joints of foot                        PT Long Term Goals - 07/28/20 1449      PT LONG TERM GOAL #1   Title Pt will increase her walking speed from 1.09 m/s to > 1.13 m/s in order to ambulate safely in community and regain "trust in her R leg"    Time 4    Period Weeks    Status New    Target Date 08/18/20      PT LONG TERM GOAL #2   Title Pt will increase R knee  ext from 140 deg to > 160 deg  and R hip ext -20 deg to > 0 deg in order to improve gait and minimize back/ hip pain    Baseline R hip ext -20 deg.  R knee ext lacking ( 140 deg flexion in supine), L 180 deg)    Time 6    Period Weeks    Status New    Target Date 09/08/20      PT LONG TERM GOAL #3   Title Pt will demo equal iliac crest height and increased SIJ mobility across 2 visits in order to progress to deep core / thoracolumabar/ hip strengthening exercises with greater outcomes    Time 2    Period Weeks    Status New    Target Date 08/11/20      PT LONG TERM GOAL #4   Title Pt will demo increased FOTO score from 42 pts to > 40 pts in order to demo improved functional mobility    Time 10    Period Weeks    Status New    Target Date 10/06/20      PT LONG TERM GOAL #5   Title Pt will report no R hip pain when stepping, lifting R LE, and getting up after sitting in order to increase functional mobility in ADLs and community    Time 10    Period Weeks    Status New    Target Date 10/06/20      Additional Long Term Goals   Additional Long Term Goals Yes      PT LONG TERM GOAL #6   Title Pt will demo proper pelvic floor coordination and report decreased SUI by 50% in order to promote pelvic floor function    Time 8    Period Weeks    Status New    Target Date 09/22/20                 Plan - 08/06/20 1159    Clinical Impression Statement Pt responded well to last session with report of almost no pain with walking for a few days. Pt demo'd increased knee extension,  increased trunk rotation and quicker gait speed. Pt required further manual Tx to improve adductor/ intrinsic foot mobility RLE. Quad mobility remains restricted and  Will continue to required more manual Tx at upcoming sessions. Plan to also assess pelvic floor to increase hip mobility.   Pt continues to benefit from skilled PT.    Personal Factors and Comorbidities Comorbidity 2    Comorbidities Stage  III uterine cancer and Tx includes removal of uterus surgrically, 6 chemotherapy Tx, 25 radiation to abdomen, 6 internal radiation Tx. Medical Records include: History of deep vein thrombosis of lower extremity.Endometrial adenocarcinoma. Bilateral pulmonary embolism    Stability/Clinical Decision Making Evolving/Moderate complexity    Rehab Potential Good    PT Frequency 2x / week    PT Duration Other (comment)   10   PT Treatment/Interventions Gait training;Balance training;Neuromuscular re-education;Moist Heat;Functional mobility training;Therapeutic activities;Therapeutic exercise;Manual techniques;Patient/family education;Scar mobilization;Cryotherapy;Taping;Splinting;Energy conservation    Consulted and Agree with Plan of Care Patient           Patient will benefit from skilled therapeutic intervention in order to improve the following deficits and impairments:  Decreased scar mobility, Decreased mobility, Decreased coordination, Abnormal gait, Improper body mechanics, Pain, Increased muscle spasms, Hypermobility, Decreased endurance, Decreased strength, Hypomobility, Decreased balance, Decreased activity tolerance, Decreased safety awareness, Decreased knowledge of precautions, Increased fascial restricitons, Impaired sensation, Postural dysfunction, Difficulty walking, Decreased range of motion  Visit Diagnosis: Other abnormalities of gait and mobility  Muscle weakness (generalized)  Other lack of coordination     Problem List Patient Active Problem List   Diagnosis Date Noted  . Chronic anticoagulation 01/02/2019  . Encounter for antineoplastic chemotherapy 01/02/2019  . Neuropathy due to chemotherapeutic drug ([place]) 01/02/2019  . S/P insertion of IVC (inferior vena caval) filter 09/26/2018  . Bilateral pulmonary embolism ([place]) 09/22/2018  . DVT (deep venous thrombosis) ([place]) 09/22/2018  . Anxiety associated with cancer diagnosis 09/07/2018  . Recurrent pleural effusion on  right 09/07/2018  . Chemotherapy-induced peripheral neuropathy ([place]) 09/07/2018  . Iron deficiency anemia due to chronic blood loss 08/10/2018  . Goals of care, counseling/discussion 08/10/2018  . Endometrial adenocarcinoma ([place]) 08/10/2018  . Arthritis 12/01/2015  . Secondary pulmonary hypertension 11/24/2015  . Allergic rhinitis 03/02/2010  . Varicose veins of lower extremities with inflammation 01/06/2010  . Chronic venous insufficiency 01/06/2010  . Nonrheumatic tricuspid valve disorder 12/30/2009  . Fibroid 12/09/2009  . Family history of malignant neoplasm of ovary 12/02/2009    Jerl Mina ,PT, DPT, E-RYT  08/06/2020, 12:10 PM  [place] MAIN [facility] SERVICES [address] [place], [place], [address] Phone: [phone]   Fax:  [phone]  Name: Karen Mann MRN: [id number] Date of Birth: July 26, 1961

## 2020-08-06 NOTE — Patient Instructions (Signed)
Feet care :  Self -feet massage   Handshake : fingers between toes, moving ballmounds/toes back and forth several times while other hand anchors at arch. Do the same at the hind/mid foot.  Heel to toes upward to a letter Big Letter T strokes to spread ballmounds and toes, several times, pinch between webs of toes  Run finger tips along top of foot between long bones "comb between the bones"    Wiggle toes and spread them out when relaxing   ___  Quad stretch on L sidelying,  20 reps with strap

## 2020-08-07 ENCOUNTER — Ambulatory Visit: Payer: 59 | Admitting: Physical Therapy

## 2020-08-07 ENCOUNTER — Other Ambulatory Visit: Payer: Self-pay

## 2020-08-07 DIAGNOSIS — R2689 Other abnormalities of gait and mobility: Secondary | ICD-10-CM

## 2020-08-07 DIAGNOSIS — M6281 Muscle weakness (generalized): Secondary | ICD-10-CM

## 2020-08-07 DIAGNOSIS — R278 Other lack of coordination: Secondary | ICD-10-CM

## 2020-08-07 NOTE — Patient Instructions (Signed)
Deep core level 1  Deep core level 2   Handout  __   Seated quad stretch ( lunge position)

## 2020-08-07 NOTE — Therapy (Signed)
[place] MAIN [facility] SERVICES [address] [place], [place], [address] Phone: [phone]   Fax:  [phone]  Physical Therapy Treatment  Patient Details  Name: Karen Mann MRN: [id number] Date of Birth: 03/17/1961 Referring Provider (PT): Laqueta Jean Date: 08/07/2020   PT End of Session - 08/07/20 1203    Visit Number 5    Number of Visits 10    Date for PT Re-Evaluation 09/08/20    PT Start Time 1103    PT Stop Time 1203    PT Time Calculation (min) 60 min           Past Medical History:  Diagnosis Date  . Allergy   . Anemia 07/20/2018  . Endometrial adenocarcinoma ([place]) 07/2018   with chemo and rad tx  . Fibroid uterus 07/24/2018   5cm/2 inch   . Ovarian mass 07/24/2018   22cm/10 inch  . Pelvic mass   . Personal history of chemotherapy 2019  . Personal history of radiation therapy 2020    Past Surgical History:  Procedure Laterality Date  . ABDOMINAL HYSTERECTOMY  10/2018  . COLONOSCOPY WITH PROPOFOL N/A 10/31/2019   Procedure: COLONOSCOPY WITH PROPOFOL;  Surgeon: Virgel Manifold, MD;  Location: [facility] ENDOSCOPY;  Service: Endoscopy;  Laterality: N/A;  . EYE SURGERY  2000   Lasik  . IVC FILTER REMOVAL Right 02/07/2019   Procedure: IVC FILTER REMOVAL;  Surgeon: Algernon Huxley, MD;  Location: [place] [facility];  Service: Cardiovascular;  Laterality: Right;  . PORTA CATH INSERTION N/A 08/13/2018   Procedure: PORTA CATH INSERTION;  Surgeon: Algernon Huxley, MD;  Location: [place] [facility];  Service: Cardiovascular;  Laterality: N/A;  . Removal of IVC filter   02/07/2019   chemo completed     There were no vitals filed for this visit.   Subjective Assessment - 08/07/20 1113    Subjective Pt felt sore after last session.    Pertinent History Stage III uterine cancer and Tx includes removal of uterus surgrically, 6 chemotherapy Tx, 25 radiation to abdomen, 6 internal radiation Tx. Medical Records include: History  of deep vein thrombosis of lower extremity.Endometrial adenocarcinoma. Bilateral pulmonary embolism    Patient Stated Goals Pt would like to not be asked if she needs a WC, and have a normal gait and play golf              [facility] PT Assessment - 08/07/20 1114      Posture/Postural Control   Posture Comments ab overuse in deep core       Palpation   SI assessment  R SIJ more mobile but limited hip ext in sidelying     Palpation comment Increased tightness at adductor/ hamstring, obt int attachments at obt rami R,        Ambulation/Gait   Gait velocity 1.39 m/s     Gait Comments Hip hike remains, cued for wider feet, more hip ext post Tx                         [facility] Adult PT Treatment/Exercise - 08/07/20 1114      Neuro Re-ed    Neuro Re-ed Details  cued for deep core 1 and 2       Modalities   Modalities Moist Heat      Moist Heat Therapy   Number Minutes Moist Heat 5 Minutes    Moist Heat Location --  sacrum      Manual Therapy   Manual therapy comments STM/MWM long axis distraction LLE,  adductor/ hamstring, obt int attachments at obt rami R, STM/MWM to promote more mobilty                        PT Long Term Goals - 07/28/20 1449      PT LONG TERM GOAL #1   Title Pt will increase her walking speed from 1.09 m/s to > 1.13 m/s in order to ambulate safely in community and regain "trust in her R leg"    Time 4    Period Weeks    Status New    Target Date 08/18/20      PT LONG TERM GOAL #2   Title Pt will increase R knee ext from 140 deg to > 160 deg  and R hip ext -20 deg to > 0 deg in order to improve gait and minimize back/ hip pain    Baseline R hip ext -20 deg.  R knee ext lacking ( 140 deg flexion in supine), L 180 deg)    Time 6    Period Weeks    Status New    Target Date 09/08/20      PT LONG TERM GOAL #3   Title Pt will demo equal iliac crest height and increased SIJ mobility across 2 visits in order to progress to deep core  / thoracolumabar/ hip strengthening exercises with greater outcomes    Time 2    Period Weeks    Status New    Target Date 08/11/20      PT LONG TERM GOAL #4   Title Pt will demo increased FOTO score from 42 pts to > 40 pts in order to demo improved functional mobility    Time 10    Period Weeks    Status New    Target Date 10/06/20      PT LONG TERM GOAL #5   Title Pt will report no R hip pain when stepping, lifting R LE, and getting up after sitting in order to increase functional mobility in ADLs and community    Time 10    Period Weeks    Status New    Target Date 10/06/20      Additional Long Term Goals   Additional Long Term Goals Yes      PT LONG TERM GOAL #6   Title Pt will demo proper pelvic floor coordination and report decreased SUI by 50% in order to promote pelvic floor function    Time 8    Period Weeks    Status New    Target Date 09/22/20                 Plan - 08/07/20 1223    Clinical Impression Statement Pt demo'd increased hip abd/ER ~5 in hooklying post Tx with manual Tx that addressed mm tightness at obturator rami attachment mm. Pt reported less medial thigh pain when walking with wider BOS. It occurs 50% less compared to [facility]. Gait speed is increasing, R knee ext has improved but hip hike and decreased hip extensions still remains.   Pt would benefit from an orthopedic consult and further imaging to further guide POC. DPT plans to refer pt to Dr. Candelaria Stagers.   Advanced today with deep core strengthening and coordination and seated quad stretch. Pt continues to benefit from skilled PT.       Personal  Factors and Comorbidities Comorbidity 2    Comorbidities Stage III uterine cancer and Tx includes removal of uterus surgrically, 6 chemotherapy Tx, 25 radiation to abdomen, 6 internal radiation Tx. Medical Records include: History of deep vein thrombosis of lower extremity.Endometrial adenocarcinoma. Bilateral pulmonary embolism    Stability/Clinical  Decision Making Evolving/Moderate complexity    Rehab Potential Good    PT Frequency 2x / week    PT Duration Other (comment)   10   PT Treatment/Interventions Gait training;Balance training;Neuromuscular re-education;Moist Heat;Functional mobility training;Therapeutic activities;Therapeutic exercise;Manual techniques;Patient/family education;Scar mobilization;Cryotherapy;Taping;Splinting;Energy conservation    Consulted and Agree with Plan of Care Patient           Patient will benefit from skilled therapeutic intervention in order to improve the following deficits and impairments:  Decreased scar mobility, Decreased mobility, Decreased coordination, Abnormal gait, Improper body mechanics, Pain, Increased muscle spasms, Hypermobility, Decreased endurance, Decreased strength, Hypomobility, Decreased balance, Decreased activity tolerance, Decreased safety awareness, Decreased knowledge of precautions, Increased fascial restricitons, Impaired sensation, Postural dysfunction, Difficulty walking, Decreased range of motion  Visit Diagnosis: Other abnormalities of gait and mobility  Muscle weakness (generalized)  Other lack of coordination     Problem List Patient Active Problem List   Diagnosis Date Noted  . Chronic anticoagulation 01/02/2019  . Encounter for antineoplastic chemotherapy 01/02/2019  . Neuropathy due to chemotherapeutic drug ([place]) 01/02/2019  . S/P insertion of IVC (inferior vena caval) filter 09/26/2018  . Bilateral pulmonary embolism ([place]) 09/22/2018  . DVT (deep venous thrombosis) ([place]) 09/22/2018  . Anxiety associated with cancer diagnosis 09/07/2018  . Recurrent pleural effusion on right 09/07/2018  . Chemotherapy-induced peripheral neuropathy ([place]) 09/07/2018  . Iron deficiency anemia due to chronic blood loss 08/10/2018  . Goals of care, counseling/discussion 08/10/2018  . Endometrial adenocarcinoma ([place]) 08/10/2018  . Arthritis 12/01/2015  . Secondary  pulmonary hypertension 11/24/2015  . Allergic rhinitis 03/02/2010  . Varicose veins of lower extremities with inflammation 01/06/2010  . Chronic venous insufficiency 01/06/2010  . Nonrheumatic tricuspid valve disorder 12/30/2009  . Fibroid 12/09/2009  . Family history of malignant neoplasm of ovary 12/02/2009    Jerl Mina ,PT, DPT, E-RYT  08/07/2020, 12:24 PM  [place] MAIN [facility] SERVICES [address] [place], [place], [address] Phone: [phone]   Fax:  [phone]  Name: PATTE WINKEL MRN: [id number] Date of Birth: 06/04/1961

## 2020-08-12 ENCOUNTER — Other Ambulatory Visit: Payer: Self-pay

## 2020-08-12 ENCOUNTER — Ambulatory Visit: Payer: 59 | Admitting: Physical Therapy

## 2020-08-12 DIAGNOSIS — R2689 Other abnormalities of gait and mobility: Secondary | ICD-10-CM

## 2020-08-12 DIAGNOSIS — R278 Other lack of coordination: Secondary | ICD-10-CM

## 2020-08-12 DIAGNOSIS — M6281 Muscle weakness (generalized): Secondary | ICD-10-CM

## 2020-08-12 NOTE — Patient Instructions (Addendum)
     WALKING WITH RESISTANCE BLUE Band at waist connected to doorknob  Stepping forward normal length steps, planting mid and forefoot down, center of mass ( navel) leans forward slightly as if you were walking uphill 3-4 steps till band feels taut ( MAKE SURE THE DOOR IS LOCKED AND WON'T OPEN)   Stepping backwards, lower heel slowly, carry trunk and hips back as you step  Cane in R hand    6 min

## 2020-08-13 NOTE — Therapy (Addendum)
[place] MAIN [facility] SERVICES [address] [place], [place], [address] Phone: [phone]   Fax:  [phone]  Physical Therapy Treatment  Patient Details  Name: Karen Mann MRN: [id number] Date of Birth: June 03, 1961 Referring Provider (PT): Laqueta Jean Date: 08/12/2020   PT End of Session - 08/13/20 1047    Visit Number 6    Number of Visits 10    Date for PT Re-Evaluation 09/08/20    PT Start Time 1110    PT Stop Time 1205    PT Time Calculation (min) 55 min           Past Medical History:  Diagnosis Date  . Allergy   . Anemia 07/20/2018  . Endometrial adenocarcinoma ([place]) 07/2018   with chemo and rad tx  . Fibroid uterus 07/24/2018   5cm/2 inch   . Ovarian mass 07/24/2018   22cm/10 inch  . Pelvic mass   . Personal history of chemotherapy 2019  . Personal history of radiation therapy 2020    Past Surgical History:  Procedure Laterality Date  . ABDOMINAL HYSTERECTOMY  10/2018  . COLONOSCOPY WITH PROPOFOL N/A 10/31/2019   Procedure: COLONOSCOPY WITH PROPOFOL;  Surgeon: Virgel Manifold, MD;  Location: [facility] ENDOSCOPY;  Service: Endoscopy;  Laterality: N/A;  . EYE SURGERY  2000   Lasik  . IVC FILTER REMOVAL Right 02/07/2019   Procedure: IVC FILTER REMOVAL;  Surgeon: Algernon Huxley, MD;  Location: [place] [facility];  Service: Cardiovascular;  Laterality: Right;  . PORTA CATH INSERTION N/A 08/13/2018   Procedure: PORTA CATH INSERTION;  Surgeon: Algernon Huxley, MD;  Location: [place] [facility];  Service: Cardiovascular;  Laterality: N/A;  . Removal of IVC filter   02/07/2019   chemo completed     There were no vitals filed for this visit.   Subjective Assessment - 08/12/20 1114    Subjective Pt reported her R back is not terrible anymore. It occurs with walking and siting for a while. Pt notices the medial thigh is less when she walks with a wider feet. The quad stretch is working    Pertinent History Stage III  uterine cancer and Tx includes removal of uterus surgrically, 6 chemotherapy Tx, 25 radiation to abdomen, 6 internal radiation Tx. Medical Records include: History of deep vein thrombosis of lower extremity.Endometrial adenocarcinoma. Bilateral pulmonary embolism    Patient Stated Goals Pt would like to not be asked if she needs a WC, and have a normal gait and play golf              [facility] PT Assessment - 08/12/20 1111      AROM   Overall AROM Comments knee ext - 30 deg ,. hooklying R  hip abd/ER preTx: ~5 deg, Post Tx: 20 deg)        Palpation   Palpation comment fascial restrictions along upper/lower quadrant R and medial aspect of abdomen                          [facility] Adult PT Treatment/Exercise - 08/12/20 1111      Ambulation/Gait   Gait Comments more knee ext      Therapeutic Activites    Therapeutic Activities --   explained benefit form deep core for deep core      Neuro Re-ed    Neuro Re-ed Details  cued for co-activation of feet and breathing in Deep core  level 2, and alignment in eccentric control with resisted band        Modalities   Modalities Moist Heat      Moist Heat Therapy   Number Minutes Moist Heat 5 Minutes    Moist Heat Location --   abdomen      Manual Therapy   Manual therapy comments fascial releases over abdomen in areas noted in assessment                        PT Long Term Goals - 07/28/20 1449      PT LONG TERM GOAL #1   Title Pt will increase her walking speed from 1.09 m/s to > 1.13 m/s in order to ambulate safely in community and regain "trust in her R leg"    Time 4    Period Weeks    Status New    Target Date 08/18/20      PT LONG TERM GOAL #2   Title Pt will increase R knee ext from 140 deg to > 160 deg  and R hip ext -20 deg to > 0 deg in order to improve gait and minimize back/ hip pain    Baseline R hip ext -20 deg.  R knee ext lacking ( 140 deg flexion in supine), L 180 deg)    Time 6    Period  Weeks    Status New    Target Date 09/08/20      PT LONG TERM GOAL #3   Title Pt will demo equal iliac crest height and increased SIJ mobility across 2 visits in order to progress to deep core / thoracolumabar/ hip strengthening exercises with greater outcomes    Time 2    Period Weeks    Status New    Target Date 08/11/20      PT LONG TERM GOAL #4   Title Pt will demo increased FOTO score from 42 pts to > 40 pts in order to demo improved functional mobility    Time 10    Period Weeks    Status New    Target Date 10/06/20      PT LONG TERM GOAL #5   Title Pt will report no R hip pain when stepping, lifting R LE, and getting up after sitting in order to increase functional mobility in ADLs and community    Time 10    Period Weeks    Status New    Target Date 10/06/20      Additional Long Term Goals   Additional Long Term Goals Yes      PT LONG TERM GOAL #6   Title Pt will demo proper pelvic floor coordination and report decreased SUI by 50% in order to promote pelvic floor function    Time 8    Period Weeks    Status New    Target Date 09/22/20                 Plan - 08/13/20 1047    Clinical Impression Statement Pt achieved more hip abd/ ER/ flex on R LE post Tx which helped to minimize abdominal fascial restrictions. Pt demo'd good carry over with more knee extension in gait from past Tx but pt still demo decreased weight bearing on RLE. Referred pt to orthopedic doctor for consultation and imaging. Pt has an appt with Dr. Candelaria Stagers on 08/24/20 which will help direct POC and appropriate Tx. Pt continues to benefit from  skilled PT.    Personal Factors and Comorbidities Comorbidity 2    Comorbidities Stage III uterine cancer and Tx includes removal of uterus surgrically, 6 chemotherapy Tx, 25 radiation to abdomen, 6 internal radiation Tx. Medical Records include: History of deep vein thrombosis of lower extremity.Endometrial adenocarcinoma. Bilateral pulmonary embolism     Stability/Clinical Decision Making Evolving/Moderate complexity    Rehab Potential Good    PT Frequency 2x / week    PT Duration Other (comment)   10   PT Treatment/Interventions Gait training;Balance training;Neuromuscular re-education;Moist Heat;Functional mobility training;Therapeutic activities;Therapeutic exercise;Manual techniques;Patient/family education;Scar mobilization;Cryotherapy;Taping;Splinting;Energy conservation    Consulted and Agree with Plan of Care Patient           Patient will benefit from skilled therapeutic intervention in order to improve the following deficits and impairments:  Decreased scar mobility, Decreased mobility, Decreased coordination, Abnormal gait, Improper body mechanics, Pain, Increased muscle spasms, Hypermobility, Decreased endurance, Decreased strength, Hypomobility, Decreased balance, Decreased activity tolerance, Decreased safety awareness, Decreased knowledge of precautions, Increased fascial restricitons, Impaired sensation, Postural dysfunction, Difficulty walking, Decreased range of motion  Visit Diagnosis: Other abnormalities of gait and mobility  Muscle weakness (generalized)  Other lack of coordination     Problem List Patient Active Problem List   Diagnosis Date Noted  . Chronic anticoagulation 01/02/2019  . Encounter for antineoplastic chemotherapy 01/02/2019  . Neuropathy due to chemotherapeutic drug ([place]) 01/02/2019  . S/P insertion of IVC (inferior vena caval) filter 09/26/2018  . Bilateral pulmonary embolism ([place]) 09/22/2018  . DVT (deep venous thrombosis) ([place]) 09/22/2018  . Anxiety associated with cancer diagnosis 09/07/2018  . Recurrent pleural effusion on right 09/07/2018  . Chemotherapy-induced peripheral neuropathy ([place]) 09/07/2018  . Iron deficiency anemia due to chronic blood loss 08/10/2018  . Goals of care, counseling/discussion 08/10/2018  . Endometrial adenocarcinoma ([place]) 08/10/2018  . Arthritis 12/01/2015  .  Secondary pulmonary hypertension 11/24/2015  . Allergic rhinitis 03/02/2010  . Varicose veins of lower extremities with inflammation 01/06/2010  . Chronic venous insufficiency 01/06/2010  . Nonrheumatic tricuspid valve disorder 12/30/2009  . Fibroid 12/09/2009  . Family history of malignant neoplasm of ovary 12/02/2009    Jerl Mina ,PT, DPT, E-RYT  08/13/2020, 10:48 AM  [place] MAIN [facility] SERVICES [address] [place], [place], [address] Phone: [phone]   Fax:  [phone]  Name: CLOTIEL TROOP MRN: [id number] Date of Birth: Oct 28, 1961

## 2020-08-19 ENCOUNTER — Ambulatory Visit: Payer: 59 | Admitting: Physical Therapy

## 2020-08-19 ENCOUNTER — Other Ambulatory Visit: Payer: Self-pay

## 2020-08-19 DIAGNOSIS — M6281 Muscle weakness (generalized): Secondary | ICD-10-CM

## 2020-08-19 DIAGNOSIS — R2689 Other abnormalities of gait and mobility: Secondary | ICD-10-CM | POA: Diagnosis not present

## 2020-08-19 DIAGNOSIS — R278 Other lack of coordination: Secondary | ICD-10-CM

## 2020-08-20 NOTE — Patient Instructions (Signed)
Toe roll in and out  Knee bent, rock side to side

## 2020-08-20 NOTE — Therapy (Addendum)
[place] MAIN [facility] SERVICES [address] [place], [place], [address] Phone: [phone]   Fax:  [phone]  Physical Therapy Treatment  Patient Details  Name: AKSHITHA CULMER MRN: [id number] Date of Birth: 18-Dec-1961 Referring Provider (PT): Laqueta Jean Date: 08/19/2020   PT End of Session - 08/19/20 1341    Visit Number 7    Number of Visits 10    Date for PT Re-Evaluation 09/08/20    PT Start Time 1103    PT Stop Time 1200    PT Time Calculation (min) 57 min    Activity Tolerance Patient tolerated treatment well    Behavior During Therapy [facility] for tasks assessed/performed           Past Medical History:  Diagnosis Date  . Allergy   . Anemia 07/20/2018  . Endometrial adenocarcinoma ([place]) 07/2018   with chemo and rad tx  . Fibroid uterus 07/24/2018   5cm/2 inch   . Ovarian mass 07/24/2018   22cm/10 inch  . Pelvic mass   . Personal history of chemotherapy 2019  . Personal history of radiation therapy 2020    Past Surgical History:  Procedure Laterality Date  . ABDOMINAL HYSTERECTOMY  10/2018  . COLONOSCOPY WITH PROPOFOL N/A 10/31/2019   Procedure: COLONOSCOPY WITH PROPOFOL;  Surgeon: Virgel Manifold, MD;  Location: [facility] ENDOSCOPY;  Service: Endoscopy;  Laterality: N/A;  . EYE SURGERY  2000   Lasik  . IVC FILTER REMOVAL Right 02/07/2019   Procedure: IVC FILTER REMOVAL;  Surgeon: Algernon Huxley, MD;  Location: [place] [facility];  Service: Cardiovascular;  Laterality: Right;  . PORTA CATH INSERTION N/A 08/13/2018   Procedure: PORTA CATH INSERTION;  Surgeon: Algernon Huxley, MD;  Location: [place] [facility];  Service: Cardiovascular;  Laterality: N/A;  . Removal of IVC filter   02/07/2019   chemo completed     There were no vitals filed for this visit.   Subjective Assessment - 08/19/20 1107    Subjective Pt reported she has good and bad days but no increased pain.  Pt had more bad days last week and she was more  aware that the mid thigh was uncomfortable with some pain. The next day, the thigh felt better again. Some days she feels she is too aggressive with her stretching.  Pt pushes on her knee when in her figure-4 stretch    Pertinent History Stage III uterine cancer and Tx includes removal of uterus surgrically, 6 chemotherapy Tx, 25 radiation to abdomen, 6 internal radiation Tx. Medical Records include: History of deep vein thrombosis of lower extremity.Endometrial adenocarcinoma. Bilateral pulmonary embolism    Patient Stated Goals Pt would like to not be asked if she needs a WC, and have a normal gait and play golf              [facility] PT Assessment - 08/20/20 1730      Palpation   Spinal mobility restriction at lower lumbar     Palpation comment tightness along pubic symphysis mm attachments R                           [facility] Adult PT Treatment/Exercise - 08/20/20 1708      Exercises   Exercises --   hip mobility stretches     Modalities   Modalities Moist Heat      Moist Heat Therapy   Number Minutes Moist  Heat 5 Minutes    Moist Heat Location --   R groin      Manual Therapy   Manual therapy comments STM/MWM at lower lumbar ( gentle lower trunk rotation), pubic symphysis attachments                         PT Long Term Goals - 07/28/20 1449      PT LONG TERM GOAL #1   Title Pt will increase her walking speed from 1.09 m/s to > 1.13 m/s in order to ambulate safely in community and regain "trust in her R leg"    Time 4    Period Weeks    Status New    Target Date 08/18/20      PT LONG TERM GOAL #2   Title Pt will increase R knee ext from 140 deg to > 160 deg  and R hip ext -20 deg to > 0 deg in order to improve gait and minimize back/ hip pain    Baseline R hip ext -20 deg.  R knee ext lacking ( 140 deg flexion in supine), L 180 deg)    Time 6    Period Weeks    Status New    Target Date 09/08/20      PT LONG TERM GOAL #3   Title Pt will  demo equal iliac crest height and increased SIJ mobility across 2 visits in order to progress to deep core / thoracolumabar/ hip strengthening exercises with greater outcomes    Time 2    Period Weeks    Status New    Target Date 08/11/20      PT LONG TERM GOAL #4   Title Pt will demo increased FOTO score from 42 pts to > 40 pts in order to demo improved functional mobility    Time 10    Period Weeks    Status New    Target Date 10/06/20      PT LONG TERM GOAL #5   Title Pt will report no R hip pain when stepping, lifting R LE, and getting up after sitting in order to increase functional mobility in ADLs and community    Time 10    Period Weeks    Status New    Target Date 10/06/20      Additional Long Term Goals   Additional Long Term Goals Yes      PT LONG TERM GOAL #6   Title Pt will demo proper pelvic floor coordination and report decreased SUI by 50% in order to promote pelvic floor function    Time 8    Period Weeks    Status New    Target Date 09/22/20                 Plan - 08/20/20 1707    Clinical Impression Statement  Pt reported R hip pain  Started in 2016 with referral pattern to medial thigh.   Across the past 7 sessions,  pt continues to show more equally aligned pelvic girdle,  increased SIJ mobility and gradually gaining hip flexion/abd/ER mobility in R hip  over the past 7 sessions. Pt's gait speed has increased and deviations are diminished but pt still demonstrates decreased RLE in gait. Lumbar spine mobilizations with gentle lower trunk rotations and external pelvic floor releases on RLE  were applied today after which pt demonstrated more reciprocal rotation between thorax/ pelvis in gait but decreased WBing on  RLE still remains.   Referring pt to orthopedist for imaging and guidance for contraindications/ precautions given pt's history of deep vein thrombosis of lower extremity, bilateral pulmonary embolism. Pt is in remission from Stage III uterine  cancer and had undergone Tx in 2019 which includes removal of uterus surgrically, 6 chemotherapy Tx, 25 radiation to abdomen, 6 internal radiation Tx.  Pt continues to benefit from skilled PT and interdisciplinary care. Plan to f/u with Dr. Candelaria Stagers ( orthopedist).       Personal Factors and Comorbidities Comorbidity 2    Comorbidities Stage III uterine cancer and Tx includes removal of uterus surgrically, 6 chemotherapy Tx, 25 radiation to abdomen, 6 internal radiation Tx. Medical Records include: History of deep vein thrombosis of lower extremity.Endometrial adenocarcinoma. Bilateral pulmonary embolism    Stability/Clinical Decision Making Evolving/Moderate complexity    Rehab Potential Good    PT Frequency 2x / week    PT Duration Other (comment)   10   PT Treatment/Interventions Gait training;Balance training;Neuromuscular re-education;Moist Heat;Functional mobility training;Therapeutic activities;Therapeutic exercise;Manual techniques;Patient/family education;Scar mobilization;Cryotherapy;Taping;Splinting;Energy conservation    Consulted and Agree with Plan of Care Patient           Patient will benefit from skilled therapeutic intervention in order to improve the following deficits and impairments:  Decreased scar mobility, Decreased mobility, Decreased coordination, Abnormal gait, Improper body mechanics, Pain, Increased muscle spasms, Hypermobility, Decreased endurance, Decreased strength, Hypomobility, Decreased balance, Decreased activity tolerance, Decreased safety awareness, Decreased knowledge of precautions, Increased fascial restricitons, Impaired sensation, Postural dysfunction, Difficulty walking, Decreased range of motion  Visit Diagnosis: Other abnormalities of gait and mobility  Muscle weakness (generalized)  Other lack of coordination     Problem List Patient Active Problem List   Diagnosis Date Noted  . Chronic anticoagulation 01/02/2019  . Encounter for  antineoplastic chemotherapy 01/02/2019  . Neuropathy due to chemotherapeutic drug ([place]) 01/02/2019  . S/P insertion of IVC (inferior vena caval) filter 09/26/2018  . Bilateral pulmonary embolism ([place]) 09/22/2018  . DVT (deep venous thrombosis) ([place]) 09/22/2018  . Anxiety associated with cancer diagnosis 09/07/2018  . Recurrent pleural effusion on right 09/07/2018  . Chemotherapy-induced peripheral neuropathy ([place]) 09/07/2018  . Iron deficiency anemia due to chronic blood loss 08/10/2018  . Goals of care, counseling/discussion 08/10/2018  . Endometrial adenocarcinoma ([place]) 08/10/2018  . Arthritis 12/01/2015  . Secondary pulmonary hypertension 11/24/2015  . Allergic rhinitis 03/02/2010  . Varicose veins of lower extremities with inflammation 01/06/2010  . Chronic venous insufficiency 01/06/2010  . Nonrheumatic tricuspid valve disorder 12/30/2009  . Fibroid 12/09/2009  . Family history of malignant neoplasm of ovary 12/02/2009    Jerl Mina ,PT, DPT, E-RYT  08/20/2020, 6:10 PM  [place] MAIN [facility] SERVICES [address] [place], [place], [address] Phone: [phone]   Fax:  [phone]  Name: DEMANI WEYRAUCH MRN: [id number] Date of Birth: [birth year]

## 2020-08-25 ENCOUNTER — Ambulatory Visit: Payer: 59 | Admitting: Physical Therapy

## 2020-08-25 ENCOUNTER — Other Ambulatory Visit: Payer: Self-pay

## 2020-08-25 DIAGNOSIS — R278 Other lack of coordination: Secondary | ICD-10-CM

## 2020-08-25 DIAGNOSIS — M6281 Muscle weakness (generalized): Secondary | ICD-10-CM

## 2020-08-25 DIAGNOSIS — R2689 Other abnormalities of gait and mobility: Secondary | ICD-10-CM

## 2020-08-25 NOTE — Therapy (Addendum)
[place] MAIN [facility] SERVICES [address] [place], [place], [address] Phone: [phone]   Fax:  [phone]  Physical Therapy Treatment  Patient Details  Name: Karen Mann MRN: [id number] Date of Birth: [birth year] Referring Provider (PT): Laqueta Jean Date: 08/25/2020   PT End of Session - 08/25/20 1536    Visit Number 8    Number of Visits 10    Date for PT Re-Evaluation 09/08/20    PT Start Time 1509    PT Stop Time 1615    PT Time Calculation (min) 66 min    Activity Tolerance Patient tolerated treatment well    Behavior During Therapy [facility] for tasks assessed/performed           Past Medical History:  Diagnosis Date  . Allergy   . Anemia 07/20/2018  . Endometrial adenocarcinoma ([place]) 07/2018   with chemo and rad tx  . Fibroid uterus 07/24/2018   5cm/2 inch   . Ovarian mass 07/24/2018   22cm/10 inch  . Pelvic mass   . Personal history of chemotherapy 2019  . Personal history of radiation therapy 2020    Past Surgical History:  Procedure Laterality Date  . ABDOMINAL HYSTERECTOMY  10/2018  . COLONOSCOPY WITH PROPOFOL N/A 10/31/2019   Procedure: COLONOSCOPY WITH PROPOFOL;  Surgeon: Virgel Manifold, MD;  Location: [facility] ENDOSCOPY;  Service: Endoscopy;  Laterality: N/A;  . EYE SURGERY  2000   Lasik  . IVC FILTER REMOVAL Right 02/07/2019   Procedure: IVC FILTER REMOVAL;  Surgeon: Algernon Huxley, MD;  Location: [place] [facility];  Service: Cardiovascular;  Laterality: Right;  . PORTA CATH INSERTION N/A 08/13/2018   Procedure: PORTA CATH INSERTION;  Surgeon: Algernon Huxley, MD;  Location: [place] [facility];  Service: Cardiovascular;  Laterality: N/A;  . Removal of IVC filter   02/07/2019   chemo completed     There were no vitals filed for this visit.   Subjective Assessment - 08/25/20 1540    Subjective Pt had an orthopedic consult with Dr. Allegra Lai and she was explained that she had severe OA in her R hip with  bone spur and additional bone growth at the top of femur head, OA at SIJ and areas where bone cysts had tried to make bone back. Pt felt increased LBP like pop in her back ( R side)  after bending to put sticking on her dad.  Pt felt the LBP again after getting off X ray table and felt it in the middle. Pt reported no radiating pain. 4/10 pain and this has stopped her with some HEP exercises  ( not able to do figure-4)    Pertinent History Stage III uterine cancer and Tx includes removal of uterus surgrically, 6 chemotherapy Tx, 25 radiation to abdomen, 6 internal radiation Tx. Medical Records include: History of deep vein thrombosis of lower extremity.Endometrial adenocarcinoma. Bilateral pulmonary embolism    Patient Stated Goals Pt would like to not be asked if she needs a WC, and have a normal gait and play golf    Currently in Pain? No/denies              [facility] PT Assessment - 08/25/20 1547      Coordination   Gross Motor Movements are Fluid and Coordinated --   poor priopioception of feet in sit to stand narrow BOS      Sit to Stand   Comments pain pre Tx,  post Tx  with cues mechanics: less pain at SIJ 60%        AROM   Overall AROM Comments no reproduction of SIJ pain with spinal motions       Palpation   Spinal mobility R convex curve at T/L     Palpation comment levelled iliac crest               Treatment:  Manual Tx:  PA mob Grade III at SIJ, rotational mob at T-spine, STM/MWM along paraspinals to minimize convex curve    Neuromuscualr re-edu/ Therapeutic ex:  See pt instructions to lengthen convex side of curve                        PT Long Term Goals - 07/28/20 1449      PT LONG TERM GOAL #1   Title Pt will increase her walking speed from 1.09 m/s to > 1.13 m/s in order to ambulate safely in community and regain "trust in her R leg"    Time 4    Period Weeks    Status New    Target Date 08/18/20      PT LONG TERM GOAL #2   Title Pt will  increase R knee ext from 140 deg to > 160 deg  and R hip ext -20 deg to > 0 deg in order to improve gait and minimize back/ hip pain    Baseline R hip ext -20 deg.  R knee ext lacking ( 140 deg flexion in supine), L 180 deg)    Time 6    Period Weeks    Status New    Target Date 09/08/20      PT LONG TERM GOAL #3   Title Pt will demo equal iliac crest height and increased SIJ mobility across 2 visits in order to progress to deep core / thoracolumabar/ hip strengthening exercises with greater outcomes    Time 2    Period Weeks    Status New    Target Date 08/11/20      PT LONG TERM GOAL #4   Title Pt will demo increased FOTO score from 42 pts to > 40 pts in order to demo improved functional mobility    Time 10    Period Weeks    Status New    Target Date 10/06/20      PT LONG TERM GOAL #5   Title Pt will report no R hip pain when stepping, lifting R LE, and getting up after sitting in order to increase functional mobility in ADLs and community    Time 10    Period Weeks    Status New    Target Date 10/06/20      Additional Long Term Goals   Additional Long Term Goals Yes      PT LONG TERM GOAL #6   Title Pt will demo proper pelvic floor coordination and report decreased SUI by 50% in order to promote pelvic floor function    Time 8    Period Weeks    Status New    Target Date 09/22/20                 Plan - 08/25/20 1757    Clinical Impression Statement Pt continues to demo increased SIJ mobility. Focused today on manual Tx to minimize convex curve of  T-spine. Pt tolerated without pain. Modified HEP for R sided flexibility to adjust for R convex curve at  thoracic spine. Suspect increased loading on R hip may be associated with spinal curves, slight R trunk lean.   Pt consulted Orthopedist and had imaging which showed severe OA on R hip. Pt was educated on additional surgeons in the area. Plan to continue to strengthen and consider loading forces on R hip when  prescribing exercises and with neuromuscular re-education to minimize pain in daily activities.   Pt continues to benefit from skilled PT.     Personal Factors and Comorbidities Comorbidity 2    Comorbidities Stage III uterine cancer and Tx includes removal of uterus surgrically, 6 chemotherapy Tx, 25 radiation to abdomen, 6 internal radiation Tx. Medical Records include: History of deep vein thrombosis of lower extremity.Endometrial adenocarcinoma. Bilateral pulmonary embolism    Stability/Clinical Decision Making Evolving/Moderate complexity    Rehab Potential Good    PT Frequency 2x / week    PT Duration Other (comment)   10   PT Treatment/Interventions Gait training;Balance training;Neuromuscular re-education;Moist Heat;Functional mobility training;Therapeutic activities;Therapeutic exercise;Manual techniques;Patient/family education;Scar mobilization;Cryotherapy;Taping;Splinting;Energy conservation    Consulted and Agree with Plan of Care Patient           Patient will benefit from skilled therapeutic intervention in order to improve the following deficits and impairments:  Decreased scar mobility, Decreased mobility, Decreased coordination, Abnormal gait, Improper body mechanics, Pain, Increased muscle spasms, Hypermobility, Decreased endurance, Decreased strength, Hypomobility, Decreased balance, Decreased activity tolerance, Decreased safety awareness, Decreased knowledge of precautions, Increased fascial restricitons, Impaired sensation, Postural dysfunction, Difficulty walking, Decreased range of motion  Visit Diagnosis: Other abnormalities of gait and mobility  Muscle weakness (generalized)  Other lack of coordination     Problem List Patient Active Problem List   Diagnosis Date Noted  . Chronic anticoagulation 01/02/2019  . Encounter for antineoplastic chemotherapy 01/02/2019  . Neuropathy due to chemotherapeutic drug ([place]) 01/02/2019  . S/P insertion of IVC (inferior  vena caval) filter 09/26/2018  . Bilateral pulmonary embolism ([place]) 09/22/2018  . DVT (deep venous thrombosis) ([place]) 09/22/2018  . Anxiety associated with cancer diagnosis 09/07/2018  . Recurrent pleural effusion on right 09/07/2018  . Chemotherapy-induced peripheral neuropathy ([place]) 09/07/2018  . Iron deficiency anemia due to chronic blood loss 08/10/2018  . Goals of care, counseling/discussion 08/10/2018  . Endometrial adenocarcinoma ([place]) 08/10/2018  . Arthritis 12/01/2015  . Secondary pulmonary hypertension 11/24/2015  . Allergic rhinitis 03/02/2010  . Varicose veins of lower extremities with inflammation 01/06/2010  . Chronic venous insufficiency 01/06/2010  . Nonrheumatic tricuspid valve disorder 12/30/2009  . Fibroid 12/09/2009  . Family history of malignant neoplasm of ovary 12/02/2009    Jerl Mina ,PT, DPT, E-RYT  08/25/2020, 6:00 PM  [place] MAIN [facility] SERVICES [address] [place], [place], [address] Phone: [phone]   Fax:  [phone]  Name: Karen Mann MRN: [id number] Date of Birth: 1961/09/28

## 2020-08-25 NOTE — Patient Instructions (Addendum)
Lengthen Back rib by _ shoulder    Lie on  R side , pillow between knees  Pull L  arm overhead over mattress, grab the edge of mattress,pull it upward, drawing elbow away from ears  Breathing 15 reps  Open book (handout)  Lying on  R side , rotating  _L_ only this week   15 reps ___   ___   [employer] wipers   Still do angel wings  ___   Notice anterior til of pelvis when sitting and get in this position prior to getting up from sitting    Proper body mechanics with getting out of a chair to decrease strain  on back &pelvic floor   Avoid holding your breath when Getting out of the chair:  Scoot to front part of chair chair Heels behind feet, feet are hip width apart, nose over toes  Inhale like you are smelling roses Exhale to stand    Use hands to push off

## 2020-08-26 ENCOUNTER — Inpatient Hospital Stay: Payer: 59

## 2020-08-26 ENCOUNTER — Inpatient Hospital Stay: Payer: 59 | Attending: Oncology

## 2020-08-26 ENCOUNTER — Inpatient Hospital Stay (HOSPITAL_BASED_OUTPATIENT_CLINIC_OR_DEPARTMENT_OTHER): Payer: 59 | Admitting: Obstetrics and Gynecology

## 2020-08-26 ENCOUNTER — Encounter: Payer: 59 | Admitting: Physical Therapy

## 2020-08-26 ENCOUNTER — Ambulatory Visit: Payer: 59 | Attending: Oncology | Admitting: Physical Therapy

## 2020-08-26 ENCOUNTER — Other Ambulatory Visit: Payer: Self-pay

## 2020-08-26 VITALS — BP 118/87 | HR 80 | Temp 97.8°F | Resp 20 | Wt 234.3 lb

## 2020-08-26 DIAGNOSIS — R278 Other lack of coordination: Secondary | ICD-10-CM | POA: Diagnosis present

## 2020-08-26 DIAGNOSIS — M6281 Muscle weakness (generalized): Secondary | ICD-10-CM | POA: Diagnosis present

## 2020-08-26 DIAGNOSIS — Z90722 Acquired absence of ovaries, bilateral: Secondary | ICD-10-CM | POA: Insufficient documentation

## 2020-08-26 DIAGNOSIS — Z923 Personal history of irradiation: Secondary | ICD-10-CM | POA: Insufficient documentation

## 2020-08-26 DIAGNOSIS — Z90710 Acquired absence of both cervix and uterus: Secondary | ICD-10-CM | POA: Insufficient documentation

## 2020-08-26 DIAGNOSIS — C541 Malignant neoplasm of endometrium: Secondary | ICD-10-CM | POA: Insufficient documentation

## 2020-08-26 DIAGNOSIS — M533 Sacrococcygeal disorders, not elsewhere classified: Secondary | ICD-10-CM | POA: Insufficient documentation

## 2020-08-26 DIAGNOSIS — R2689 Other abnormalities of gait and mobility: Secondary | ICD-10-CM

## 2020-08-26 DIAGNOSIS — Z95828 Presence of other vascular implants and grafts: Secondary | ICD-10-CM

## 2020-08-26 MED ORDER — HEPARIN SOD (PORK) LOCK FLUSH 100 UNIT/ML IV SOLN
500.0000 [IU] | Freq: Once | INTRAVENOUS | Status: AC
  Administered 2020-08-26: 500 [IU] via INTRAVENOUS
  Filled 2020-08-26: qty 5

## 2020-08-26 MED ORDER — SODIUM CHLORIDE 0.9% FLUSH
10.0000 mL | Freq: Once | INTRAVENOUS | Status: AC
  Administered 2020-08-26: 10 mL via INTRAVENOUS
  Filled 2020-08-26: qty 10

## 2020-08-26 MED ORDER — HEPARIN SOD (PORK) LOCK FLUSH 100 UNIT/ML IV SOLN
INTRAVENOUS | Status: AC
  Filled 2020-08-26: qty 5

## 2020-08-26 NOTE — Patient Instructions (Signed)
   Feet slides :   Points of contact at sitting bones  Four points of contact of foot, Heel up, ankle not twist out Lower heel while keeping R knee out , arch lifted , more pressure on outer points and big toe  Four points of contact of foot, Slide foot    Repeated with other foot

## 2020-08-26 NOTE — H&P (View-Only) (Signed)
Gynecologic Oncology Progress Note   Referring Provider:  Dr. Gilman Schmidt  Chief Complaint: Stage IIIA grade 3 endometrial adenocarcinoma  Subjective:  Karen Mann is a 59 y.o. 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   She returns today for surveillance. Doing well overall with no GI, GU or Resp complaints. Has bilateral hip pain and plans for hip replacement later this year.  Also still has IV portacath.  She has had a colonoscopy on 10/31/2019 which showed diverticulosis, otherwise normal, and recommendation to repeat in 10 years around 10/2029.  Mammogram 01/06/2020 reported as bi-rads category 1: negative.   She transitioned from eliquis to ASA for history of VTE after cancer diagnosis.   She got the COVID19 vaccine 3/21.   CA 125 has been followed and normal (< 10) since 1/20 06/04/2020 5.8   Oncology History She was seen in GYN oncology clinic and reported continual vaginal bleeding for 5-6 months.  She was found to be significantly anemic and started on iron pills.  She was also having significant dyspnea. Patient initially referred to Dr. Gilman Schmidt by PCP, Dr. Brita Romp, d/t large abdominal mass seen on pelvic ultrasound concerning for ovarian cancer. Patient first noticed bloating, fatigue, and constipation in July 2019. She presented to PCP. FSH 6.4, LH 5.8. 07/23/18- Ultrasound was performed which showed: pelvic mass measuring 20 x 13 x 22 cm with cystic and solid components. Ultrasound also showed: 4-5cm fibroid and thickened endometrium of 5m  08/05/18- MR PELVIS W WO CONTRAST - 20.6 cm (17.1 x 20.6 x 20.0 cm) complex cystic/solid mass in the pelvis w/ numerous thickened/enhancing septations, compatible with malignant ovarian neoplasm. Neither ovary is discretely  visualized. - Small volume abdominopelvic ascites. Gross peritoneal disease is not visualized but cannot be excluded. - Left hydroureteronephrosis secondary to extrinsic compression. - 5.3 intramural anterior uterine body fibroid  On exam, Dr. SGilman Schmidtnoted bilateral lower extremity swelling with mass extending close to sternum, ~ 30 cm. Endometrial biopsy was performed. Pathology: Endometrioid Adenocarcinoma consistent with FIGO grade 3  CA 125  08/06/2018 622.0  08/09/18- CT C/A/P-imaging showed a large cystic/solid mass measuring 28 x 11.6 x 21.4 cm suspicious for ovarian malignancy along with moderate ascites, large right pleural effusion, and moderate left and mild right hydronephrosis attributed to ureteral compression due to mass.   08/15/2018-initiated carboplatin AUC 6 and paclitaxel 175 mg/m.    CA 125- 692  09/05/2018-cycle 2 carboplatin AUC 6 and paclitaxel 175 mg/m.  [id number]  Mild hypersensitivity to paclitaxel with chest tightness. Taxol. Solu-Medrol and Benadryl given along with fluid bolus.  Challenged and patient was able to tolerate treatment at slower rate.  09/21/2018-planned for hysterectomy, however, patient diagnosed with PE/DVT.  CT chest PE protocol- 1.  Bilateral pulmonary emboli, most centrally in the bilateral main pulmonary arteries.  No evidence of right heart strain or pulmonary infarct. 2.  Moderate right pleural effusion 3.  Partially visualized left hydronephrosis  UKoreabilateral lower extremities revealed 1.  Deep vein thrombus in the right popliteal vein, thrombus extends into the right tibioperoneal trunk and proximal and mid portions of the right peroneal veins. 2.  Deep vein thrombus in the left common femoral vein and in the left popliteal vein. 3.  Occlusive thrombus in seen on in the left greater saphenous vein, superficial vein.  Echo revealed LVEF 55%.  09/26/2018-underwent placement of IVC filter in IR.  She was started on therapeutic Lovenox.   She  proceeded with cycle 3 carboplatin AUC 6 and paclitaxel 175 mg/m with Neulasta support on 09/26/2018.   Ca 125- 521  10/19/2018-CT C/A/P 1. Redemonstrated large cystic and solid mass measuring similarly in size. 2. Improved left hydronephrosis. 3.  Improved ascites with redemonstration of a moderate right pleural effusion.  Redemonstrated large cystic solid pelvic mass, dorsal to the uterus and inseparable from the ovaries, measuring approximately 22.8 x 21.9 x 12.3 cm previously measuring 21.6 x 21.2 x 14.8 cm.  Slightly improved ascites.  She underwent palliative thoracentesis on 10/26/2018, 10/11/18, 10/02/18, 09/19/18, 09/10/18, 08/30/18, 08/22/18, 08/16/18, 08/10/18. Pleural fluid was suspicious for malignancy on 10/02/18 with rare atypical PAX-8 and MOC-31 positive cells present.   10/29/18- patient underwent exploratory laparotomy, total abdominal hysterectomy, bilateral salpingo-oophorectomy, omentectomy, and right ureterolysis at [facility]. EBL 500 ml and she received 2 units of PRBCs during her admission postoperatively. Removal of uterus and very large right ovarian metastasis that was adherent to the pelvic structures. No evidence of carcinomatosis. Parts of the mass that were adherent to the pelvic peritoneum after it was removed were also resected and had cancer.    Pathology-  DIAGNOSIS A. Mesenteric nodule, excision:  - Benign inclusion with transitional type epithelium. Negative for malignancy.    B. Right pelvic peritoneum, excision: - Mesothelial-lined fibromuscular and adipose tissue. Negative for malignancy.    C. Appendices epiploica, excision: - Metastatic adenocarcinoma.    D. Omentum, omentectomy:  - Omentum. Negative for malignancy.    E. Uterus, bilateral ovaries and fallopian tubes, hysterectomy and bilateral salpingo-oophorectomy:  - Endometrioid adenocarcinoma of the endometrium. See note FIGO grade: 3 of 3. Tumor size: 3.5 cm Depth of invasion: 3 mm in a 37 mm thick  myometrium Cervical involvement: Not identified  Adnexal involvement: Metastatic carcinoma involves bilateral ovaries. Lymphovascular invasion: Not identified   MSI-stable  -Abdominal fluid-negative.  No evidence of malignancy.   She completed 3 additional cycles of adjuvant chemotherapy with carbo-Taxol on 11/21/2018-01/02/2019.  01/21/2019-CT C/A/P 1. Interval resection of dominant abdominopelvic mass with resolution of abdominal ascites 2. Hysterectomy with asymmetric soft tissue at the left superior portion of the vaginal cuff. Cannot exclude residual or recurrent disease in this area. This could either be re-evaluated at CT follow-up of 3 months or more entirely characterized with PET 3. Otherwise, no evidence of metastatic disease in the chest, abdomen, or pelvis 4. Cholelithiasis 5.  Possible constipation  11/19 Germline genetic testing with In Vitae was negative for mutations in BRCA1/2 and other HR pathway genes.  Possible mosaic variant in NF1 (not clinically significant per genetic counselor) and VUS in POLE and RECQL4.   CA 125 58.3 11/21/2018 6.0 01/02/2019  She initiated pelvic radiation with Dr. Baruch Gouty 01/22/2019 because fragments of ovarian mass that were peeled off the pelvic peritoneum after main mass removed had cancer. Finished radiation in 2/20 including pelvic ERT and vaginal bracytherapy.   CT abd/pelvis 1/20 IMPRESSION: 1. Stable soft tissue density involving the left vaginal cuff. This may be postop in nature, although residual tumor at this site cannot definitely be excluded. Recommend continued follow-up by CT in 3 months. 2. No new or progressive disease identified within the abdomen or pelvis. 3. Cholelithiasis. No radiographic evidence of cholecystitis.  IVC filter removed 02/07/19 and she is takes Eliquis.  Follow up CT scans in 4/20 and 9/20 showed stability of vaginal cuff and pelvic exams normal.  Genetic Testing- Microsatellite stable, TMB low  (3) -Foundation One PD-L1 - TPS 0% PTEN, TP53, PIK3CA mutations  Family History: She reports her mother died of ovarian cancer at age 40 (diagnosed at age 59).    Family History  Problem Relation Age of Onset  . Hypertension Father   . Ovarian cancer Mother 31       deceased 29  . Healthy Brother   . Skin cancer Paternal Grandmother        deceased 34s  . Cancer Paternal Grandfather        unk. primary; deceased 60s  . Ovarian cancer Other        mat grandmother's sister    Problem List: Patient Active Problem List   Diagnosis Date Noted  . Chronic anticoagulation 01/02/2019  . Encounter for antineoplastic chemotherapy 01/02/2019  . Neuropathy due to chemotherapeutic drug ([place]) 01/02/2019  . S/P insertion of IVC (inferior vena caval) filter 09/26/2018  . Bilateral pulmonary embolism ([place]) 09/22/2018  . DVT (deep venous thrombosis) ([place]) 09/22/2018  . Anxiety associated with cancer diagnosis 09/07/2018  . Recurrent pleural effusion on right 09/07/2018  . Chemotherapy-induced peripheral neuropathy ([place]) 09/07/2018  . Iron deficiency anemia due to chronic blood loss 08/10/2018  . Goals of care, counseling/discussion 08/10/2018  . Endometrial adenocarcinoma ([place]) 08/10/2018  . Arthritis 12/01/2015  . Secondary pulmonary hypertension 11/24/2015  . Allergic rhinitis 03/02/2010  . Varicose veins of lower extremities with inflammation 01/06/2010  . Chronic venous insufficiency 01/06/2010  . Nonrheumatic tricuspid valve disorder 12/30/2009  . Fibroid 12/09/2009  . Family history of malignant neoplasm of ovary 12/02/2009    Past Medical History: Past Medical History:  Diagnosis Date  . Allergy   . Anemia 07/20/2018  . Endometrial adenocarcinoma ([place]) 07/2018   with chemo and rad tx  . Fibroid uterus 07/24/2018   5cm/2 inch   . Ovarian mass 07/24/2018   22cm/10 inch  . Pelvic mass   . Personal history of chemotherapy 2019  . Personal history of radiation therapy 2020     Past Surgical History: Past Surgical History:  Procedure Laterality Date  . ABDOMINAL HYSTERECTOMY  10/2018  . COLONOSCOPY WITH PROPOFOL N/A 10/31/2019   Procedure: COLONOSCOPY WITH PROPOFOL;  Surgeon: Virgel Manifold, MD;  Location: [facility] ENDOSCOPY;  Service: Endoscopy;  Laterality: N/A;  . EYE SURGERY  2000   Lasik  . IVC FILTER REMOVAL Right 02/07/2019   Procedure: IVC FILTER REMOVAL;  Surgeon: Algernon Huxley, MD;  Location: [place] [facility];  Service: Cardiovascular;  Laterality: Right;  . PORTA CATH INSERTION N/A 08/13/2018   Procedure: PORTA CATH INSERTION;  Surgeon: Algernon Huxley, MD;  Location: [place] [facility];  Service: Cardiovascular;  Laterality: N/A;  . Removal of IVC filter   02/07/2019   chemo completed     Past Gynecologic History:  G1P1 Age of Menarche: 63 Regular menstrual periods Denies abnormal pap smears Denies history of STDs  OB History:  OB History  Gravida Para Term Preterm AB Living  _0 SAB TAB Ectopic Multiple Live Births          1    # Outcome Date GA Lbr Len/2nd Weight Sex Delivery Anes PTL Lv  1 Term 09/03/85 [redacted]w[redacted]d 9 lb 2.5 oz (4.153 kg) M Vag-Spont   LIV    Family History: Family History  Problem Relation Age of Onset  . Hypertension Father   . Ovarian cancer Mother  46       deceased 73  . Healthy Brother   . Skin cancer Paternal Grandmother        deceased 52s  . Cancer Paternal Grandfather        unk. primary; deceased 22s  . Ovarian cancer Other        mat grandmother's sister    Social History: Social History   Socioeconomic History  . Marital status: Married    Spouse name: Heath Lark   . Number of children: 1  . Years of education: Not on file  . Highest education level: Not on file  Occupational History  . Not on file  Tobacco Use  . Smoking status: Never Smoker  . Smokeless tobacco: Never Used  Vaping Use  . Vaping Use: Never used  Substance and Sexual Activity  . Alcohol use: No  . Drug use:  No  . Sexual activity: Not Currently    Birth control/protection: Post-menopausal  Other Topics Concern  . Not on file  Social History Narrative  . Not on file   Social Determinants of Health   Financial Resource Strain:   . Difficulty of Paying Living Expenses: Not on file  Food Insecurity:   . Worried About Charity fundraiser in the Last Year: Not on file  . Ran Out of Food in the Last Year: Not on file  Transportation Needs:   . Lack of Transportation (Medical): Not on file  . Lack of Transportation (Non-Medical): Not on file  Physical Activity:   . Days of Exercise per Week: Not on file  . Minutes of Exercise per Session: Not on file  Stress:   . Feeling of Stress : Not on file  Social Connections:   . Frequency of Communication with Friends and Family: Not on file  . Frequency of Social Gatherings with Friends and Family: Not on file  . Attends Religious Services: Not on file  . Active Member of Clubs or Organizations: Not on file  . Attends Archivist Meetings: Not on file  . Marital Status: Not on file  Intimate Partner Violence:   . Fear of Current or Ex-Partner: Not on file  . Emotionally Abused: Not on file  . Physically Abused: Not on file  . Sexually Abused: Not on file    Allergies: No Known Allergies  Current Medications: Current Outpatient Medications  Medication Sig Dispense Refill  . aspirin EC 81 MG tablet Take 81 mg by mouth daily. Swallow whole.    Marland Kitchen aspirin 81 MG chewable tablet Chew by mouth daily. (Patient not taking: Reported on 08/26/2020)    . ELIQUIS 2.5 MG TABS tablet TAKE 1 TABLET TWICE A DAY (Patient not taking: Reported on 07/22/2020) 180 tablet 3   No current facility-administered medications for this visit.   Review of Systems General:  no complaints Skin: no complaints Eyes: no complaints HEENT: no complaints Breasts: no complaints Pulmonary: no complaints Cardiac: no complaints Gastrointestinal: no  complaints Genitourinary/Sexual: no complaints Ob/Gyn: no complaints Musculoskeletal: no complaints Hematology: no complaints Neurologic/Psych: no complaints  Objective:  Physical Examination:  Today's Vitals   08/26/20 1350 08/26/20 1351  BP: 118/87   Pulse: 80   Resp: 20   Temp: 97.8 F (36.6 C)   SpO2: 97%   Weight: 234 lb 4.8 oz (106.3 kg)   PainSc:  0-No pain   Body mass index is 34.6 kg/m.   ECOG Performance Status: 0 - Asymptomatic  GENERAL: Patient is a well  appearing female in no acute distress HEENT:  Sclera clear. Anicteric NODES:  Negative axillary, supraclavicular, inguinal lymph node survery LUNGS:  Clear to auscultation bilaterally.  IV port on right chest wall.   HEART:  Regular rate and rhythm.  ABDOMEN:  Soft, nontender.  No hernias, incisions well healed. No masses or ascites EXTREMITIES:  No peripheral edema. Atraumatic. No cyanosis. Limited mobility in right hip.  SKIN:  Clear with no obvious rashes or skin changes.  NEURO:  Nonfocal. Well oriented.  Appropriate affect.  Pelvis exam EGBUS/Vag: normal. No masses or tissue thickening Bimanual/RV: no masses or nodularity  Assessment:  Karen Mann is a 59 y.o. female diagnosed with Stage IIIA grade 3 endometrioid endometrial adenocarcinoma 8/19 on endometrial biopsy.  She had pleural effusions that required multiple thoracenteses and were suspicious for cancer.  CA125 elevated at 622. Received 3 cycles of carbo/taxol chemotherapy and developed DVT and pulmonary emboli and these were treated with Lovonox and IVC filter.  Then underwent TAH, BSO, omentectomy 10/29/18 for removal of uterus and very large right ovarian metastasis that was adherent to the pelvic structures. No evidence of carcinomatosis. Parts of the mass that were adherent to the pelvic peritoneum after mass was removed had cancer and were excised.  Then had 3 additional cycles of carboplatin/taxol.  CT scan C/A/P 2/20 showed area of soft  tissue asymmetry above left vaginal cuff, but I did not feel anything on pelvic exam.  External pelvic radiation and vaginal brachytherapy 2/20. Repeat CT scan A/P 4/20 and 9/20 stable.  No change in vaginal cuff area. She feels well and has no complaints. CA125 stable at 6.  NED.  Transitioned from Eliquis to ASA for PEs diagnosed 9/19 and IVC filter removed 2/20.  Genetic Testing- Microsatellite stable, TMB low (3). Foundation One PD-L1 - TPS 0%, PTEN, TP53, PIK3CA mutations.  Germline genetic testing negative in 11/19. In vitae panel showed possible significant variant in NF1. Genetic counselor discussed significance with patient.   Plan:   Problem List Items Addressed This Visit      Genitourinary   Endometrial adenocarcinoma ([place]) - Primary      We will see her in clinic for exam in 6 months or sooner if concerning symptoms arise.  Will check CA125 as well today.   Since CA125 is a good marker for her cancer will not order CT scan unless it is rising or new symptoms.    She will see Dr Tasia Catchings in 3 months for continued management.  Patient would like IV port removed and think this is reasonable since now 2 years since diagnosis.   Considering hip replacement for arthritis and do not think there are any contraindications other than DVT in 2019.     I personally interviewed and examined the patient. Agreed with the above/below plan of care. Patient/family questions were answered.   Mellody Drown, MD  I personally interviewed and examined the patient. Agreed with the above/below plan of care. I have directly contributed to assessment and plan of care of this patient and educated and discussed with patient and family.  Mellody Drown, MD  CC:  Brita Romp Dionne Bucy, [place] [address] [place],  [place] [address] [phone]

## 2020-08-26 NOTE — Progress Notes (Signed)
Gynecologic Oncology Progress Note   Referring Provider:  Dr. Gilman Schmidt  Chief Complaint: Stage IIIA grade 3 endometrial adenocarcinoma  Subjective:  Karen Mann is a 59 y.o. 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   She returns today for surveillance. Doing well overall with no GI, GU or Resp complaints. Has bilateral hip pain and plans for hip replacement later this year.  Also still has IV portacath.  She has had a colonoscopy on 10/31/2019 which showed diverticulosis, otherwise normal, and recommendation to repeat in 10 years around 10/2029.  Mammogram 01/06/2020 reported as bi-rads category 1: negative.   She transitioned from eliquis to ASA for history of VTE after cancer diagnosis.   She got the COVID19 vaccine 3/21.   CA 125 has been followed and normal (< 10) since 1/20 06/04/2020 5.8   Oncology History She was seen in GYN oncology clinic and reported continual vaginal bleeding for 5-6 months.  She was found to be significantly anemic and started on iron pills.  She was also having significant dyspnea. Patient initially referred to Dr. Gilman Schmidt by PCP, Dr. Brita Romp, d/t large abdominal mass seen on pelvic ultrasound concerning for ovarian cancer. Patient first noticed bloating, fatigue, and constipation in July 2019. She presented to PCP. FSH 6.4, LH 5.8. 07/23/18- Ultrasound was performed which showed: pelvic mass measuring 20 x 13 x 22 cm with cystic and solid components. Ultrasound also showed: 4-5cm fibroid and thickened endometrium of 42m  08/05/18- MR PELVIS W WO CONTRAST - 20.6 cm (17.1 x 20.6 x 20.0 cm) complex cystic/solid mass in the pelvis w/ numerous thickened/enhancing septations, compatible with malignant ovarian neoplasm. Neither ovary is discretely  visualized. - Small volume abdominopelvic ascites. Gross peritoneal disease is not visualized but cannot be excluded. - Left hydroureteronephrosis secondary to extrinsic compression. - 5.3 intramural anterior uterine body fibroid  On exam, Dr. SGilman Schmidtnoted bilateral lower extremity swelling with mass extending close to sternum, ~ 30 cm. Endometrial biopsy was performed. Pathology: Endometrioid Adenocarcinoma consistent with FIGO grade 3  CA 125  08/06/2018 622.0  08/09/18- CT C/A/P-imaging showed a large cystic/solid mass measuring 28 x 11.6 x 21.4 cm suspicious for ovarian malignancy along with moderate ascites, large right pleural effusion, and moderate left and mild right hydronephrosis attributed to ureteral compression due to mass.   08/15/2018-initiated carboplatin AUC 6 and paclitaxel 175 mg/m.    CA 125- 692  09/05/2018-cycle 2 carboplatin AUC 6 and paclitaxel 175 mg/m.  [id number]  Mild hypersensitivity to paclitaxel with chest tightness. Taxol. Solu-Medrol and Benadryl given along with fluid bolus.  Challenged and patient was able to tolerate treatment at slower rate.  09/21/2018-planned for hysterectomy, however, patient diagnosed with PE/DVT.  CT chest PE protocol- 1.  Bilateral pulmonary emboli, most centrally in the bilateral main pulmonary arteries.  No evidence of right heart strain or pulmonary infarct. 2.  Moderate right pleural effusion 3.  Partially visualized left hydronephrosis  UKoreabilateral lower extremities revealed 1.  Deep vein thrombus in the right popliteal vein, thrombus extends into the right tibioperoneal trunk and proximal and mid portions of the right peroneal veins. 2.  Deep vein thrombus in the left common femoral vein and in the left popliteal vein. 3.  Occlusive thrombus in seen on in the left greater saphenous vein, superficial vein.  Echo revealed LVEF 55%.  09/26/2018-underwent placement of IVC filter in IR.  She was started on therapeutic Lovenox.   She  proceeded with cycle 3 carboplatin AUC 6 and paclitaxel 175 mg/m with Neulasta support on 09/26/2018.   Ca 125- 521  10/19/2018-CT C/A/P 1. Redemonstrated large cystic and solid mass measuring similarly in size. 2. Improved left hydronephrosis. 3.  Improved ascites with redemonstration of a moderate right pleural effusion.  Redemonstrated large cystic solid pelvic mass, dorsal to the uterus and inseparable from the ovaries, measuring approximately 22.8 x 21.9 x 12.3 cm previously measuring 21.6 x 21.2 x 14.8 cm.  Slightly improved ascites.  She underwent palliative thoracentesis on 10/26/2018, 10/11/18, 10/02/18, 09/19/18, 09/10/18, 08/30/18, 08/22/18, 08/16/18, 08/10/18. Pleural fluid was suspicious for malignancy on 10/02/18 with rare atypical PAX-8 and MOC-31 positive cells present.   10/29/18- patient underwent exploratory laparotomy, total abdominal hysterectomy, bilateral salpingo-oophorectomy, omentectomy, and right ureterolysis at [facility]. EBL 500 ml and she received 2 units of PRBCs during her admission postoperatively. Removal of uterus and very large right ovarian metastasis that was adherent to the pelvic structures. No evidence of carcinomatosis. Parts of the mass that were adherent to the pelvic peritoneum after it was removed were also resected and had cancer.    Pathology-  DIAGNOSIS A. Mesenteric nodule, excision:  - Benign inclusion with transitional type epithelium. Negative for malignancy.    B. Right pelvic peritoneum, excision: - Mesothelial-lined fibromuscular and adipose tissue. Negative for malignancy.    C. Appendices epiploica, excision: - Metastatic adenocarcinoma.    D. Omentum, omentectomy:  - Omentum. Negative for malignancy.    E. Uterus, bilateral ovaries and fallopian tubes, hysterectomy and bilateral salpingo-oophorectomy:  - Endometrioid adenocarcinoma of the endometrium. See note FIGO grade: 3 of 3. Tumor size: 3.5 cm Depth of invasion: 3 mm in a 37 mm thick  myometrium Cervical involvement: Not identified  Adnexal involvement: Metastatic carcinoma involves bilateral ovaries. Lymphovascular invasion: Not identified   MSI-stable  -Abdominal fluid-negative.  No evidence of malignancy.   She completed 3 additional cycles of adjuvant chemotherapy with carbo-Taxol on 11/21/2018-01/02/2019.  01/21/2019-CT C/A/P 1. Interval resection of dominant abdominopelvic mass with resolution of abdominal ascites 2. Hysterectomy with asymmetric soft tissue at the left superior portion of the vaginal cuff. Cannot exclude residual or recurrent disease in this area. This could either be re-evaluated at CT follow-up of 3 months or more entirely characterized with PET 3. Otherwise, no evidence of metastatic disease in the chest, abdomen, or pelvis 4. Cholelithiasis 5.  Possible constipation  11/19 Germline genetic testing with In Vitae was negative for mutations in BRCA1/2 and other HR pathway genes.  Possible mosaic variant in NF1 (not clinically significant per genetic counselor) and VUS in POLE and RECQL4.   CA 125 58.3 11/21/2018 6.0 01/02/2019  She initiated pelvic radiation with Dr. Baruch Gouty 01/22/2019 because fragments of ovarian mass that were peeled off the pelvic peritoneum after main mass removed had cancer. Finished radiation in 2/20 including pelvic ERT and vaginal bracytherapy.   CT abd/pelvis 1/20 IMPRESSION: 1. Stable soft tissue density involving the left vaginal cuff. This may be postop in nature, although residual tumor at this site cannot definitely be excluded. Recommend continued follow-up by CT in 3 months. 2. No new or progressive disease identified within the abdomen or pelvis. 3. Cholelithiasis. No radiographic evidence of cholecystitis.  IVC filter removed 02/07/19 and she is takes Eliquis.  Follow up CT scans in 4/20 and 9/20 showed stability of vaginal cuff and pelvic exams normal.  Genetic Testing- Microsatellite stable, TMB low  (3) -Foundation One PD-L1 - TPS 0% PTEN, TP53, PIK3CA mutations  Family History: She reports her mother died of ovarian cancer at age 37 (diagnosed at age 58).    Family History  Problem Relation Age of Onset  . Hypertension Father   . Ovarian cancer Mother 24       deceased 58  . Healthy Brother   . Skin cancer Paternal Grandmother        deceased 11s  . Cancer Paternal Grandfather        unk. primary; deceased 37s  . Ovarian cancer Other        mat grandmother's sister    Problem List: Patient Active Problem List   Diagnosis Date Noted  . Chronic anticoagulation 01/02/2019  . Encounter for antineoplastic chemotherapy 01/02/2019  . Neuropathy due to chemotherapeutic drug ([place]) 01/02/2019  . S/P insertion of IVC (inferior vena caval) filter 09/26/2018  . Bilateral pulmonary embolism ([place]) 09/22/2018  . DVT (deep venous thrombosis) ([place]) 09/22/2018  . Anxiety associated with cancer diagnosis 09/07/2018  . Recurrent pleural effusion on right 09/07/2018  . Chemotherapy-induced peripheral neuropathy ([place]) 09/07/2018  . Iron deficiency anemia due to chronic blood loss 08/10/2018  . Goals of care, counseling/discussion 08/10/2018  . Endometrial adenocarcinoma ([place]) 08/10/2018  . Arthritis 12/01/2015  . Secondary pulmonary hypertension 11/24/2015  . Allergic rhinitis 03/02/2010  . Varicose veins of lower extremities with inflammation 01/06/2010  . Chronic venous insufficiency 01/06/2010  . Nonrheumatic tricuspid valve disorder 12/30/2009  . Fibroid 12/09/2009  . Family history of malignant neoplasm of ovary 12/02/2009    Past Medical History: Past Medical History:  Diagnosis Date  . Allergy   . Anemia 07/20/2018  . Endometrial adenocarcinoma ([place]) 07/2018   with chemo and rad tx  . Fibroid uterus 07/24/2018   5cm/2 inch   . Ovarian mass 07/24/2018   22cm/10 inch  . Pelvic mass   . Personal history of chemotherapy 2019  . Personal history of radiation therapy 2020     Past Surgical History: Past Surgical History:  Procedure Laterality Date  . ABDOMINAL HYSTERECTOMY  10/2018  . COLONOSCOPY WITH PROPOFOL N/A 10/31/2019   Procedure: COLONOSCOPY WITH PROPOFOL;  Surgeon: Virgel Manifold, MD;  Location: [facility] ENDOSCOPY;  Service: Endoscopy;  Laterality: N/A;  . EYE SURGERY  2000   Lasik  . IVC FILTER REMOVAL Right 02/07/2019   Procedure: IVC FILTER REMOVAL;  Surgeon: Algernon Huxley, MD;  Location: [place] [facility];  Service: Cardiovascular;  Laterality: Right;  . PORTA CATH INSERTION N/A 08/13/2018   Procedure: PORTA CATH INSERTION;  Surgeon: Algernon Huxley, MD;  Location: [place] [facility];  Service: Cardiovascular;  Laterality: N/A;  . Removal of IVC filter   02/07/2019   chemo completed     Past Gynecologic History:  G1P1 Age of Menarche: 38 Regular menstrual periods Denies abnormal pap smears Denies history of STDs  OB History:  OB History  Gravida Para Term Preterm AB Living  _0 SAB TAB Ectopic Multiple Live Births          1    # Outcome Date GA Lbr Len/2nd Weight Sex Delivery Anes PTL Lv  1 Term 09/03/85 [redacted]w[redacted]d 9 lb 2.5 oz (4.153 kg) M Vag-Spont   LIV    Family History: Family History  Problem Relation Age of Onset  . Hypertension Father   . Ovarian cancer Mother  45       deceased 76  . Healthy Brother   . Skin cancer Paternal Grandmother        deceased 69s  . Cancer Paternal Grandfather        unk. primary; deceased 17s  . Ovarian cancer Other        mat grandmother's sister    Social History: Social History   Socioeconomic History  . Marital status: Married    Spouse name: Heath Lark   . Number of children: 1  . Years of education: Not on file  . Highest education level: Not on file  Occupational History  . Not on file  Tobacco Use  . Smoking status: Never Smoker  . Smokeless tobacco: Never Used  Vaping Use  . Vaping Use: Never used  Substance and Sexual Activity  . Alcohol use: No  . Drug use:  No  . Sexual activity: Not Currently    Birth control/protection: Post-menopausal  Other Topics Concern  . Not on file  Social History Narrative  . Not on file   Social Determinants of Health   Financial Resource Strain:   . Difficulty of Paying Living Expenses: Not on file  Food Insecurity:   . Worried About Charity fundraiser in the Last Year: Not on file  . Ran Out of Food in the Last Year: Not on file  Transportation Needs:   . Lack of Transportation (Medical): Not on file  . Lack of Transportation (Non-Medical): Not on file  Physical Activity:   . Days of Exercise per Week: Not on file  . Minutes of Exercise per Session: Not on file  Stress:   . Feeling of Stress : Not on file  Social Connections:   . Frequency of Communication with Friends and Family: Not on file  . Frequency of Social Gatherings with Friends and Family: Not on file  . Attends Religious Services: Not on file  . Active Member of Clubs or Organizations: Not on file  . Attends Archivist Meetings: Not on file  . Marital Status: Not on file  Intimate Partner Violence:   . Fear of Current or Ex-Partner: Not on file  . Emotionally Abused: Not on file  . Physically Abused: Not on file  . Sexually Abused: Not on file    Allergies: No Known Allergies  Current Medications: Current Outpatient Medications  Medication Sig Dispense Refill  . aspirin EC 81 MG tablet Take 81 mg by mouth daily. Swallow whole.    Marland Kitchen aspirin 81 MG chewable tablet Chew by mouth daily. (Patient not taking: Reported on 08/26/2020)    . ELIQUIS 2.5 MG TABS tablet TAKE 1 TABLET TWICE A DAY (Patient not taking: Reported on 07/22/2020) 180 tablet 3   No current facility-administered medications for this visit.   Review of Systems General:  no complaints Skin: no complaints Eyes: no complaints HEENT: no complaints Breasts: no complaints Pulmonary: no complaints Cardiac: no complaints Gastrointestinal: no  complaints Genitourinary/Sexual: no complaints Ob/Gyn: no complaints Musculoskeletal: no complaints Hematology: no complaints Neurologic/Psych: no complaints  Objective:  Physical Examination:  Today's Vitals   08/26/20 1350 08/26/20 1351  BP: 118/87   Pulse: 80   Resp: 20   Temp: 97.8 F (36.6 C)   SpO2: 97%   Weight: 234 lb 4.8 oz (106.3 kg)   PainSc:  0-No pain   Body mass index is 34.6 kg/m.   ECOG Performance Status: 0 - Asymptomatic  GENERAL: Patient is a well  appearing female in no acute distress HEENT:  Sclera clear. Anicteric NODES:  Negative axillary, supraclavicular, inguinal lymph node survery LUNGS:  Clear to auscultation bilaterally.  IV port on right chest wall.   HEART:  Regular rate and rhythm.  ABDOMEN:  Soft, nontender.  No hernias, incisions well healed. No masses or ascites EXTREMITIES:  No peripheral edema. Atraumatic. No cyanosis. Limited mobility in right hip.  SKIN:  Clear with no obvious rashes or skin changes.  NEURO:  Nonfocal. Well oriented.  Appropriate affect.  Pelvis exam EGBUS/Vag: normal. No masses or tissue thickening Bimanual/RV: no masses or nodularity  Assessment:  Karen Mann is a 59 y.o. female diagnosed with Stage IIIA grade 3 endometrioid endometrial adenocarcinoma 8/19 on endometrial biopsy.  She had pleural effusions that required multiple thoracenteses and were suspicious for cancer.  CA125 elevated at 622. Received 3 cycles of carbo/taxol chemotherapy and developed DVT and pulmonary emboli and these were treated with Lovonox and IVC filter.  Then underwent TAH, BSO, omentectomy 10/29/18 for removal of uterus and very large right ovarian metastasis that was adherent to the pelvic structures. No evidence of carcinomatosis. Parts of the mass that were adherent to the pelvic peritoneum after mass was removed had cancer and were excised.  Then had 3 additional cycles of carboplatin/taxol.  CT scan C/A/P 2/20 showed area of soft  tissue asymmetry above left vaginal cuff, but I did not feel anything on pelvic exam.  External pelvic radiation and vaginal brachytherapy 2/20. Repeat CT scan A/P 4/20 and 9/20 stable.  No change in vaginal cuff area. She feels well and has no complaints. CA125 stable at 6.  NED.  Transitioned from Eliquis to ASA for PEs diagnosed 9/19 and IVC filter removed 2/20.  Genetic Testing- Microsatellite stable, TMB low (3). Foundation One PD-L1 - TPS 0%, PTEN, TP53, PIK3CA mutations.  Germline genetic testing negative in 11/19. In vitae panel showed possible significant variant in NF1. Genetic counselor discussed significance with patient.   Plan:   Problem List Items Addressed This Visit      Genitourinary   Endometrial adenocarcinoma ([place]) - Primary      We will see her in clinic for exam in 6 months or sooner if concerning symptoms arise.  Will check CA125 as well today.   Since CA125 is a good marker for her cancer will not order CT scan unless it is rising or new symptoms.    She will see Dr Tasia Catchings in 3 months for continued management.  Patient would like IV port removed and think this is reasonable since now 2 years since diagnosis.   Considering hip replacement for arthritis and do not think there are any contraindications other than DVT in 2019.     I personally interviewed and examined the patient. Agreed with the above/below plan of care. Patient/family questions were answered.   Mellody Drown, MD  I personally interviewed and examined the patient. Agreed with the above/below plan of care. I have directly contributed to assessment and plan of care of this patient and educated and discussed with patient and family.  Mellody Drown, MD  CC:  Brita Romp [address], [address] [place],  [place] [address] [phone]

## 2020-08-27 ENCOUNTER — Telehealth (INDEPENDENT_AMBULATORY_CARE_PROVIDER_SITE_OTHER): Payer: Self-pay

## 2020-08-27 LAB — CA 125: Cancer Antigen (CA) 125: 5.5 U/mL (ref 0.0–38.1)

## 2020-08-27 NOTE — Therapy (Signed)
[place] MAIN [facility] SERVICES [address] [place], [place], [address] Phone: [phone]   Fax:  [phone]  Physical Therapy Treatment  Patient Details  Name: Karen Mann MRN: [id number] Date of Birth: [birth year] Referring Provider (PT): Laqueta Jean Date: 08/26/2020   PT End of Session - 08/26/20 2637    Visit Number 9    Number of Visits 10    Date for PT Re-Evaluation 09/08/20    PT Start Time 1210    PT Stop Time 1245    PT Time Calculation (min) 35 min    Activity Tolerance Patient tolerated treatment well    Behavior During Therapy [facility] for tasks assessed/performed           Past Medical History:  Diagnosis Date  . Allergy   . Anemia 07/20/2018  . Endometrial adenocarcinoma ([place]) 07/2018   with chemo and rad tx  . Fibroid uterus 07/24/2018   5cm/2 inch   . Ovarian mass 07/24/2018   22cm/10 inch  . Pelvic mass   . Personal history of chemotherapy 2019  . Personal history of radiation therapy 2020    Past Surgical History:  Procedure Laterality Date  . ABDOMINAL HYSTERECTOMY  10/2018  . COLONOSCOPY WITH PROPOFOL N/A 10/31/2019   Procedure: COLONOSCOPY WITH PROPOFOL;  Surgeon: Virgel Manifold, MD;  Location: [facility] ENDOSCOPY;  Service: Endoscopy;  Laterality: N/A;  . EYE SURGERY  2000   Lasik  . IVC FILTER REMOVAL Right 02/07/2019   Procedure: IVC FILTER REMOVAL;  Surgeon: Algernon Huxley, MD;  Location: [place] [facility];  Service: Cardiovascular;  Laterality: Right;  . PORTA CATH INSERTION N/A 08/13/2018   Procedure: PORTA CATH INSERTION;  Surgeon: Algernon Huxley, MD;  Location: [place] [facility];  Service: Cardiovascular;  Laterality: N/A;  . Removal of IVC filter   02/07/2019   chemo completed     There were no vitals filed for this visit.   Subjective Assessment - 08/26/20 1212    Subjective Pt reported after yesterday's session, pt had no more back pain.    Pertinent History Stage III uterine cancer  and Tx includes removal of uterus surgrically, 6 chemotherapy Tx, 25 radiation to abdomen, 6 internal radiation Tx. Medical Records include: History of deep vein thrombosis of lower extremity.Endometrial adenocarcinoma. Bilateral pulmonary embolism    Patient Stated Goals Pt would like to not be asked if she needs a WC, and have a normal gait and play golf              [facility] PT Assessment - 08/27/20 0817      Palpation   Palpation comment R foot hypomobile / navicular / cuneiform, tightness at extensor digitorium longus/ tib ant                             [facility] Adult PT Treatment/Exercise - 08/27/20 8588      Ambulation/Gait   Gait Comments medial collapse of R foot , adduction of R knee      Neuro Re-ed    Neuro Re-ed Details  cued for more supination       Manual Therapy   Manual therapy comments STM/MWM R   distraction at talocrural joints, AP/PA along rays to promote balance between supination/ pronation, STM at transverse head of hallus adductor mm, along pereoneal longus, AP/ superior mob at fibula head to promote DF/EV, STM/MWM extensor digitorium /  longus/ peroneal longus/brevis      Kinesiotex --   lift R medial arch                      PT Long Term Goals - 07/28/20 1449      PT LONG TERM GOAL #1   Title Pt will increase her walking speed from 1.09 m/s to > 1.13 m/s in order to ambulate safely in community and regain "trust in her R leg"    Time 4    Period Weeks    Status New    Target Date 08/18/20      PT LONG TERM GOAL #2   Title Pt will increase R knee ext from 140 deg to > 160 deg  and R hip ext -20 deg to > 0 deg in order to improve gait and minimize back/ hip pain    Baseline R hip ext -20 deg.  R knee ext lacking ( 140 deg flexion in supine), L 180 deg)    Time 6    Period Weeks    Status New    Target Date 09/08/20      PT LONG TERM GOAL #3   Title Pt will demo equal iliac crest height and increased SIJ mobility across 2  visits in order to progress to deep core / thoracolumabar/ hip strengthening exercises with greater outcomes    Time 2    Period Weeks    Status New    Target Date 08/11/20      PT LONG TERM GOAL #4   Title Pt will demo increased FOTO score from 42 pts to > 40 pts in order to demo improved functional mobility    Time 10    Period Weeks    Status New    Target Date 10/06/20      PT LONG TERM GOAL #5   Title Pt will report no R hip pain when stepping, lifting R LE, and getting up after sitting in order to increase functional mobility in ADLs and community    Time 10    Period Weeks    Status New    Target Date 10/06/20      Additional Long Term Goals   Additional Long Term Goals Yes      PT LONG TERM GOAL #6   Title Pt will demo proper pelvic floor coordination and report decreased SUI by 50% in order to promote pelvic floor function    Time 8    Period Weeks    Status New    Target Date 09/22/20                 Plan - 08/26/20 1244    Clinical Impression Statement Pt had to leave early for another medical appt and thus appt was abbreviated today.  Pt showed good carry over with less thoracic curve compared to last session. Pt responded well to manual Tx at the T/L junction which helped diminish pt's LBP.   Today, focused on pt's R medial collapse at ankle and R adducted knee. Manual Tx helped to promote more transverse arch co-activation, DF/EV, and lift of plantar arch. Neuromuscular re-education was provided to help pt with proper knee and ankle alignment to minimize medial collapse of R ankle. This lower kinetic chain alignment re-education will help with uneven loading forces through the hip joint which has severe OA.   Pt continues to benefit from skilled PT    Personal Factors and  Comorbidities Comorbidity 2    Comorbidities Stage III uterine cancer and Tx includes removal of uterus surgrically, 6 chemotherapy Tx, 25 radiation to abdomen, 6 internal radiation Tx.  Medical Records include: History of deep vein thrombosis of lower extremity.Endometrial adenocarcinoma. Bilateral pulmonary embolism    Stability/Clinical Decision Making Evolving/Moderate complexity    Rehab Potential Good    PT Frequency 2x / week    PT Duration Other (comment)   10   PT Treatment/Interventions Gait training;Balance training;Neuromuscular re-education;Moist Heat;Functional mobility training;Therapeutic activities;Therapeutic exercise;Manual techniques;Patient/family education;Scar mobilization;Cryotherapy;Taping;Splinting;Energy conservation    Consulted and Agree with Plan of Care Patient           Patient will benefit from skilled therapeutic intervention in order to improve the following deficits and impairments:  Decreased scar mobility, Decreased mobility, Decreased coordination, Abnormal gait, Improper body mechanics, Pain, Increased muscle spasms, Hypermobility, Decreased endurance, Decreased strength, Hypomobility, Decreased balance, Decreased activity tolerance, Decreased safety awareness, Decreased knowledge of precautions, Increased fascial restricitons, Impaired sensation, Postural dysfunction, Difficulty walking, Decreased range of motion  Visit Diagnosis: Other abnormalities of gait and mobility  Muscle weakness (generalized)  Other lack of coordination     Problem List Patient Active Problem List   Diagnosis Date Noted  . Chronic anticoagulation 01/02/2019  . Encounter for antineoplastic chemotherapy 01/02/2019  . Neuropathy due to chemotherapeutic drug ([place]) 01/02/2019  . S/P insertion of IVC (inferior vena caval) filter 09/26/2018  . Bilateral pulmonary embolism ([place]) 09/22/2018  . DVT (deep venous thrombosis) ([place]) 09/22/2018  . Anxiety associated with cancer diagnosis 09/07/2018  . Recurrent pleural effusion on right 09/07/2018  . Chemotherapy-induced peripheral neuropathy ([place]) 09/07/2018  . Iron deficiency anemia due to chronic blood loss  08/10/2018  . Goals of care, counseling/discussion 08/10/2018  . Endometrial adenocarcinoma ([place]) 08/10/2018  . Arthritis 12/01/2015  . Secondary pulmonary hypertension 11/24/2015  . Allergic rhinitis 03/02/2010  . Varicose veins of lower extremities with inflammation 01/06/2010  . Chronic venous insufficiency 01/06/2010  . Nonrheumatic tricuspid valve disorder 12/30/2009  . Fibroid 12/09/2009  . Family history of malignant neoplasm of ovary 12/02/2009    Jerl Mina ,PT, DPT, E-RYT  08/27/2020, 8:30 AM  [place] MAIN [facility] SERVICES [address] [place], [place], [address] Phone: [phone]   Fax:  [phone]  Name: Karen Mann MRN: [id number] Date of Birth: [birth year]

## 2020-08-27 NOTE — Telephone Encounter (Signed)
09/03/20/Lauren Zenia Resides NP: Hi Dr. Lucky Cowboy- this lovely patient was wondering if you'd remove  the port which you had placed for her chemo. She's now 2 years out and ready to have it removed. Thanks!  Dr. Lucky Cowboy: Sure. No problem. I am sure we can get her port taken out some time next week. Will send to our scheduler, and they will contact her with the info Beckey Rutter NP: Doristine Devoid! Thank you  Me: Spoke with the patient she is scheduled with Dr. Lucky Cowboy for a port removal on 09/03/20 with a 9:45 am arrival time to the MM. Covid testing on 09/01/20 between 8-1 pm at the Time. Pre-procedure instructions were discussed and will be mailed.

## 2020-08-28 ENCOUNTER — Encounter: Payer: 59 | Admitting: Physical Therapy

## 2020-09-01 ENCOUNTER — Other Ambulatory Visit: Payer: Self-pay

## 2020-09-01 ENCOUNTER — Ambulatory Visit: Payer: 59 | Admitting: Physical Therapy

## 2020-09-01 ENCOUNTER — Other Ambulatory Visit
Admission: RE | Admit: 2020-09-01 | Discharge: 2020-09-01 | Disposition: A | Discharge status: Home / Self Care | Payer: 59 | Source: Ambulatory Visit | Attending: Vascular Surgery | Admitting: Vascular Surgery

## 2020-09-01 DIAGNOSIS — Z20822 Contact with and (suspected) exposure to covid-19: Secondary | ICD-10-CM | POA: Diagnosis not present

## 2020-09-01 DIAGNOSIS — R2689 Other abnormalities of gait and mobility: Secondary | ICD-10-CM | POA: Diagnosis not present

## 2020-09-01 DIAGNOSIS — M6281 Muscle weakness (generalized): Secondary | ICD-10-CM

## 2020-09-01 DIAGNOSIS — Z01812 Encounter for preprocedural laboratory examination: Secondary | ICD-10-CM | POA: Diagnosis not present

## 2020-09-01 DIAGNOSIS — R278 Other lack of coordination: Secondary | ICD-10-CM

## 2020-09-01 NOTE — Patient Instructions (Signed)
Locust pose  Pillow under hips if needed for decreased low back pain  Palms face midline by hips  Finger tips shooting straight down  Imagine holding pencil under your armpits Draw shoulders away from ears Inhale Exhale lift chest up slightly without feeling it in your back. The bend happens in the midback  (keep chin tucked)   10 reps

## 2020-09-02 ENCOUNTER — Encounter: Payer: 59 | Admitting: Physical Therapy

## 2020-09-02 ENCOUNTER — Other Ambulatory Visit (INDEPENDENT_AMBULATORY_CARE_PROVIDER_SITE_OTHER): Payer: Self-pay | Admitting: Nurse Practitioner

## 2020-09-02 LAB — SARS CORONAVIRUS 2 (TAT 6-24 HRS): SARS Coronavirus 2: NEGATIVE

## 2020-09-02 NOTE — Therapy (Addendum)
[place] MAIN [facility] SERVICES [address] [place], [place], [address] Phone: [phone]   Fax:  [phone]  Physical Therapy Treatment / Progress Note  Patient Details  Name: Karen Mann MRN: [id number] Date of Birth: [birth year] Referring Provider (PT): Laqueta Jean Date: 09/01/2020   PT End of Session - 09/01/20 1551    Visit Number 10    Number of Visits 20    Date for PT Re-Evaluation 11/10/20    PT Start Time 1500    PT Stop Time 1600    PT Time Calculation (min) 60 min    Activity Tolerance Patient tolerated treatment well    Behavior During Therapy [facility] for tasks assessed/performed           Past Medical History:  Diagnosis Date  . Allergy   . Anemia 07/20/2018  . Endometrial adenocarcinoma ([place]) 07/2018   with chemo and rad tx  . Fibroid uterus 07/24/2018   5cm/2 inch   . Ovarian mass 07/24/2018   22cm/10 inch  . Pelvic mass   . Personal history of chemotherapy 2019  . Personal history of radiation therapy 2020    Past Surgical History:  Procedure Laterality Date  . ABDOMINAL HYSTERECTOMY  10/2018  . COLONOSCOPY WITH PROPOFOL N/A 10/31/2019   Procedure: COLONOSCOPY WITH PROPOFOL;  Surgeon: Virgel Manifold, MD;  Location: [facility] ENDOSCOPY;  Service: Endoscopy;  Laterality: N/A;  . EYE SURGERY  2000   Lasik  . IVC FILTER REMOVAL Right 02/07/2019   Procedure: IVC FILTER REMOVAL;  Surgeon: Algernon Huxley, MD;  Location: [place] [facility];  Service: Cardiovascular;  Laterality: Right;  . PORTA CATH INSERTION N/A 08/13/2018   Procedure: PORTA CATH INSERTION;  Surgeon: Algernon Huxley, MD;  Location: [place] [facility];  Service: Cardiovascular;  Laterality: N/A;  . Removal of IVC filter   02/07/2019   chemo completed     There were no vitals filed for this visit.   Subjective Assessment - 09/01/20 1503    Subjective Pt reported she is making it to the bathroom in time before leakage. Pt reports her oncology  appt last week went really good. Her markers showed remission. Her port will be taken out.    Pertinent History Stage III uterine cancer and Tx includes removal of uterus surgrically, 6 chemotherapy Tx, 25 radiation to abdomen, 6 internal radiation Tx. Medical Records include: History of deep vein thrombosis of lower extremity.Endometrial adenocarcinoma. Bilateral pulmonary embolism    Patient Stated Goals Pt would like to not be asked if she needs a WC, and have a normal gait and play golf              [facility] PT Assessment - 09/02/20 1034      Palpation   Spinal mobility less spinal deviations , forward head posture prominent     Palpation comment increased mm tightness at L medial scap. intercostals, upper trap                          [facility] Adult PT Treatment/Exercise - 09/02/20 1033      Neuro Re-ed    Neuro Re-ed Details  cued for new HEP      Moist Heat Therapy   Number Minutes Moist Heat 5 Minutes    Moist Heat Location --   L UE quadrant thoracic      Manual Therapy   Manual therapy comments STM/MWM  at L medial scapula, paraspinals, intercostals L                        PT Long Term Goals - 09/01/20 1551      PT LONG TERM GOAL #1   Title Pt will increase her walking speed from 1.09 m/s to > 1.13 m/s in order to ambulate safely in community and regain "trust in her R leg"    Time 4    Period Weeks    Status On-going      PT LONG TERM GOAL #2   Title Pt will increase R knee ext from 140 deg to > 160 deg  and R hip ext -20 deg to > 0 deg in order to improve gait and minimize back/ hip pain    Baseline R hip ext -20 deg.  R knee ext lacking ( 140 deg flexion in supine), L 180 deg)    Time 6    Period Weeks    Status On-going      PT LONG TERM GOAL #3   Title Pt will demo equal iliac crest height and increased SIJ mobility across 2 visits in order to progress to deep core / thoracolumabar/ hip strengthening exercises with greater outcomes      Time 2    Period Weeks    Status Achieved      PT LONG TERM GOAL #4   Title Pt will demo increased FOTO score from 42 pts to > 40 pts in order to demo improved functional mobility    Time 10    Period Weeks    Status On-going      PT LONG TERM GOAL #5   Title Pt will report no R hip pain when stepping, lifting R LE, and getting up after sitting in order to increase functional mobility in ADLs and community    Time 10    Period Weeks    Status On-going      PT LONG TERM GOAL #6   Title Pt will demo proper pelvic floor coordination and report decreased SUI by 50% in order to promote pelvic floor function    Time 8    Period Weeks    Status On-going                 Plan - 09/02/20 1107    Clinical Impression Statement Pt is progressing well towards her goals as she demonstrates more spinal, pelvic mobility, levelled pelvic alignment,  decreased mm tensions along hip and lower kinetic chain which altogether have helped with longer stride length and a more reciprocal gait pattern in gait. However, pt continues to demonstrate a limp which is attributed to severe hip OA as verified by Xray ad orthopedic consult. Pt expressed she would consider THA.   Plan to continue addressing pelvic floor coordination and deep core coordination which will help address SUI and improving IAP system. Improving her IAP will help minimize worsening of SUI and prevent pelvic/ abdominal dysfunctions because this area has  increased scar restrictions and less effective intra-abdominal pressure system 2/2 hysterectomy and cancer-related surgeries.   Pt continues to benefit from skilled PT.    Personal Factors and Comorbidities Comorbidity 2    Comorbidities Stage III uterine cancer and Tx includes removal of uterus surgrically, 6 chemotherapy Tx, 25 radiation to abdomen, 6 internal radiation Tx. Medical Records include: History of deep vein thrombosis of lower extremity.Endometrial adenocarcinoma. Bilateral  pulmonary embolism  Stability/Clinical Decision Making Evolving/Moderate complexity    Rehab Potential Good    PT Frequency 2x / week    PT Duration Other (comment)   10   PT Treatment/Interventions Gait training;Balance training;Neuromuscular re-education;Moist Heat;Functional mobility training;Therapeutic activities;Therapeutic exercise;Manual techniques;Patient/family education;Scar mobilization;Cryotherapy;Taping;Splinting;Energy conservation    Consulted and Agree with Plan of Care Patient           Patient will benefit from skilled therapeutic intervention in order to improve the following deficits and impairments:  Decreased scar mobility, Decreased mobility, Decreased coordination, Abnormal gait, Improper body mechanics, Pain, Increased muscle spasms, Hypermobility, Decreased endurance, Decreased strength, Hypomobility, Decreased balance, Decreased activity tolerance, Decreased safety awareness, Decreased knowledge of precautions, Increased fascial restricitons, Impaired sensation, Postural dysfunction, Difficulty walking, Decreased range of motion  Visit Diagnosis: Other abnormalities of gait and mobility  Muscle weakness (generalized)  Other lack of coordination     Problem List Patient Active Problem List   Diagnosis Date Noted  . Chronic anticoagulation 01/02/2019  . Encounter for antineoplastic chemotherapy 01/02/2019  . Neuropathy due to chemotherapeutic drug ([place]) 01/02/2019  . S/P insertion of IVC (inferior vena caval) filter 09/26/2018  . Bilateral pulmonary embolism ([place]) 09/22/2018  . DVT (deep venous thrombosis) ([place]) 09/22/2018  . Anxiety associated with cancer diagnosis 09/07/2018  . Recurrent pleural effusion on right 09/07/2018  . Chemotherapy-induced peripheral neuropathy ([place]) 09/07/2018  . Iron deficiency anemia due to chronic blood loss 08/10/2018  . Goals of care, counseling/discussion 08/10/2018  . Endometrial adenocarcinoma ([place]) 08/10/2018  .  Arthritis 12/01/2015  . Secondary pulmonary hypertension 11/24/2015  . Allergic rhinitis 03/02/2010  . Varicose veins of lower extremities with inflammation 01/06/2010  . Chronic venous insufficiency 01/06/2010  . Nonrheumatic tricuspid valve disorder 12/30/2009  . Fibroid 12/09/2009  . Family history of malignant neoplasm of ovary 12/02/2009    Jerl Mina ,PT, DPT, E-RYT  09/02/2020, 11:07 AM  [place] MAIN [facility] SERVICES [address] [place], [place], [address] Phone: [phone]   Fax:  [phone]  Name: ELEORA SUTHERLAND MRN: [id number] Date of Birth: [birth year]

## 2020-09-03 ENCOUNTER — Encounter
Admission: RE | Disposition: A | Discharge status: Home / Self Care | Payer: Self-pay | Source: Home / Self Care | Attending: Vascular Surgery

## 2020-09-03 ENCOUNTER — Encounter: Payer: Self-pay | Admitting: Vascular Surgery

## 2020-09-03 ENCOUNTER — Ambulatory Visit: Payer: 59 | Admitting: Physical Therapy

## 2020-09-03 ENCOUNTER — Ambulatory Visit
Admission: RE | Admit: 2020-09-03 | Discharge: 2020-09-03 | Disposition: A | Discharge status: Home / Self Care | Payer: 59 | Attending: Vascular Surgery | Admitting: Vascular Surgery

## 2020-09-03 DIAGNOSIS — G620 Drug-induced polyneuropathy: Secondary | ICD-10-CM | POA: Insufficient documentation

## 2020-09-03 DIAGNOSIS — Z452 Encounter for adjustment and management of vascular access device: Secondary | ICD-10-CM | POA: Insufficient documentation

## 2020-09-03 DIAGNOSIS — Z8041 Family history of malignant neoplasm of ovary: Secondary | ICD-10-CM | POA: Diagnosis not present

## 2020-09-03 DIAGNOSIS — Z90722 Acquired absence of ovaries, bilateral: Secondary | ICD-10-CM | POA: Insufficient documentation

## 2020-09-03 DIAGNOSIS — C541 Malignant neoplasm of endometrium: Secondary | ICD-10-CM | POA: Diagnosis not present

## 2020-09-03 DIAGNOSIS — Z7982 Long term (current) use of aspirin: Secondary | ICD-10-CM | POA: Insufficient documentation

## 2020-09-03 DIAGNOSIS — C7961 Secondary malignant neoplasm of right ovary: Secondary | ICD-10-CM | POA: Insufficient documentation

## 2020-09-03 DIAGNOSIS — Z86711 Personal history of pulmonary embolism: Secondary | ICD-10-CM | POA: Insufficient documentation

## 2020-09-03 DIAGNOSIS — Z9221 Personal history of antineoplastic chemotherapy: Secondary | ICD-10-CM | POA: Diagnosis not present

## 2020-09-03 DIAGNOSIS — Z923 Personal history of irradiation: Secondary | ICD-10-CM | POA: Insufficient documentation

## 2020-09-03 HISTORY — PX: PORTA CATH REMOVAL: CATH118286

## 2020-09-03 SURGERY — PORTA CATH REMOVAL
Anesthesia: Moderate Sedation

## 2020-09-03 MED ORDER — METHYLPREDNISOLONE SODIUM SUCC 125 MG IJ SOLR: 125.0000 mg | Freq: Once | INTRAMUSCULAR | Status: DC | PRN

## 2020-09-03 MED ORDER — FENTANYL CITRATE (PF) 100 MCG/2ML IJ SOLN
INTRAMUSCULAR | Status: AC
  Filled 2020-09-03: qty 2

## 2020-09-03 MED ORDER — FAMOTIDINE 20 MG PO TABS: 40.0000 mg | ORAL_TABLET | Freq: Once | ORAL | Status: DC | PRN

## 2020-09-03 MED ORDER — MIDAZOLAM HCL 2 MG/2ML IJ SOLN
INTRAMUSCULAR | Status: DC | PRN
  Administered 2020-09-03: 1 mg via INTRAVENOUS
  Administered 2020-09-03: 2 mg via INTRAVENOUS

## 2020-09-03 MED ORDER — HYDROMORPHONE HCL 1 MG/ML IJ SOLN: 1.0000 mg | Freq: Once | INTRAMUSCULAR | Status: DC | PRN

## 2020-09-03 MED ORDER — CEFAZOLIN SODIUM-DEXTROSE 2-4 GM/100ML-% IV SOLN: 2.0000 g | Freq: Once | INTRAVENOUS | Status: AC

## 2020-09-03 MED ORDER — SODIUM CHLORIDE 0.9 % IV SOLN: INTRAVENOUS | Status: DC

## 2020-09-03 MED ORDER — CEFAZOLIN SODIUM-DEXTROSE 2-4 GM/100ML-% IV SOLN
INTRAVENOUS | Status: AC
  Administered 2020-09-03: 2 g via INTRAVENOUS
  Filled 2020-09-03: qty 100

## 2020-09-03 MED ORDER — MIDAZOLAM HCL 5 MG/5ML IJ SOLN
INTRAMUSCULAR | Status: AC
  Filled 2020-09-03: qty 5

## 2020-09-03 MED ORDER — ONDANSETRON HCL 4 MG/2ML IJ SOLN: 4.0000 mg | Freq: Four times a day (QID) | INTRAMUSCULAR | Status: DC | PRN

## 2020-09-03 MED ORDER — MIDAZOLAM HCL 2 MG/ML PO SYRP: 8.0000 mg | ORAL_SOLUTION | Freq: Once | ORAL | Status: DC | PRN

## 2020-09-03 MED ORDER — FENTANYL CITRATE (PF) 100 MCG/2ML IJ SOLN
INTRAMUSCULAR | Status: DC | PRN
Start: 2020-09-03 — End: 2020-09-03
  Administered 2020-09-03: 50 ug via INTRAVENOUS
  Administered 2020-09-03: 25 ug via INTRAVENOUS

## 2020-09-03 SURGICAL SUPPLY — 7 items
DERMABOND ADVANCED (GAUZE/BANDAGES/DRESSINGS) ×1
DERMABOND ADVANCED .7 DNX12 (GAUZE/BANDAGES/DRESSINGS) ×1 IMPLANT
PACK ANGIOGRAPHY (CUSTOM PROCEDURE TRAY) ×2 IMPLANT
PENCIL ELECTRO HAND CTR (MISCELLANEOUS) ×2 IMPLANT
SUT MNCRL AB 4-0 PS2 18 (SUTURE) ×2 IMPLANT
SUT VIC AB 3-0 SH 27 (SUTURE) ×1
SUT VIC AB 3-0 SH 27X BRD (SUTURE) ×1 IMPLANT

## 2020-09-03 NOTE — Op Note (Signed)
[place] [facility]       Operative Note  Date: 09/03/2020  Preoperative diagnosis:  1.  Endometrial adenocarcinoma no longer using port  Postoperative diagnosis:  Same as above  Procedures: #1. Removal of right jugular port a cath   Surgeon: Leotis Pain, MD  Anesthesia: Local with moderate conscious sedation for 17 minutes using 3 mg of Versed and 75 mcg of Fentanyl  Fluoroscopy time: none  Contrast used: 0  Estimated blood loss: Minimal  Indication for the procedure:  The patient is a 59 y.o. female who has completed her therapy for endometrial adenocarcinoma and no longer needs their Port-A-Cath. The patient desires to have this removed. Risks and benefits including need for potential replacement with recurrent disease were discussed and patient is agreeable to proceed.  Description of procedure: The patient was brought to the vascular and interventional radiology suite. Moderate conscious sedation was administered during a face to face encounter with the patient throughout the procedure with my supervision of the RN administering medicines and monitoring the patient's vital signs, pulse oximetry, telemetry and mental status throughout from the start of the procedure until the patient was taken to the recovery room.  The right neck chest and shoulder were sterilely prepped and draped, and a sterile surgical field was created. The area was then anesthetized with 1% lidocaine copiously. The previous incision was reopened and electrocautery used to dissected down to the port and the catheter. These were dissected free and the catheter was gently removed from the vein in its entirety. The port was dissected out from the fibrous connective tissue and the Prolene sutures were removed. The port was then removed in its entirety including the catheter. The wound was then closed with a 3-0 Vicryl and a 4-0 Monocryl and Dermabond was placed as a  dressing. The patient was then taken to the recovery room in stable condition having tolerated the procedure well.  Complications: none  Condition: stable   Leotis Pain, MD 09/03/2020 10:57 AM   This note was created with Dragon Medical transcription system. Any errors in dictation are purely unintentional.

## 2020-09-03 NOTE — Interval H&P Note (Signed)
History and Physical Interval Note:  09/03/2020 10:22 AM  Karen Mann  has presented today for surgery, with the diagnosis of Porta Cath Removal    Endometrial CA Covid Sept 7.  The various methods of treatment have been discussed with the patient and family. After consideration of risks, benefits and other options for treatment, the patient has consented to  Procedure(s): PORTA CATH REMOVAL (N/A) as a surgical intervention.  The patient's history has been reviewed, patient examined, no change in status, stable for surgery.  I have reviewed the patient's chart and labs.  Questions were answered to the patient's satisfaction.     Leotis Pain

## 2020-09-09 ENCOUNTER — Other Ambulatory Visit: Payer: Self-pay

## 2020-09-09 ENCOUNTER — Ambulatory Visit: Payer: 59 | Admitting: Physical Therapy

## 2020-09-09 DIAGNOSIS — M6281 Muscle weakness (generalized): Secondary | ICD-10-CM

## 2020-09-09 DIAGNOSIS — R2689 Other abnormalities of gait and mobility: Secondary | ICD-10-CM | POA: Diagnosis not present

## 2020-09-09 DIAGNOSIS — R278 Other lack of coordination: Secondary | ICD-10-CM

## 2020-09-09 NOTE — Patient Instructions (Addendum)
  Avoid straining pelvic floor, abdominal muscles , spine  Use log rolling technique instead of getting out of bed with your neck or the sit-up     Log rolling into and out of bed   Log rolling into and out of bed If getting out of bed on R side, Bent knees, scoot hips/ shoulder to L  Raise R arm completely overhead, rolling onto armpit  Then lower bent knees to bed to get into complete side lying position  Then drop legs off bed, and push up onto R elbow/forearm, and use L hand to push onto the bed   ___    Side of hip stretch:  Reclined twist for hips and side of the hips/ legs  Lay on your back, knees bend Scoot hips to the L , leave shoulders in place  Rock knees to the R side, R arm overhead    ___  Relaxation in restorative pose 10 min  - beach towel folded into thirds lengthwise ,  -small hand towel rolled under neck  Lie on it at sacrum, pillow under your knees.  Allow your arms to side and feel pect muscles relax and open,   __ bring hiking poles for next session

## 2020-09-09 NOTE — Therapy (Signed)
[place] MAIN [facility] SERVICES [address] [place], [place], [address] Phone: [phone]   Fax:  [phone]  Physical Therapy Treatment  Patient Details  Name: Karen Mann MRN: [id number] Date of Birth: Jan 28, 1961 Referring Provider (PT): Laqueta Jean Date: 09/09/2020   PT End of Session - 09/09/20 1112    Visit Number 11    Number of Visits 20    Date for PT Re-Evaluation 11/10/20    PT Start Time 1107    PT Stop Time 1210    PT Time Calculation (min) 63 min    Activity Tolerance Patient tolerated treatment well    Behavior During Therapy [facility] for tasks assessed/performed           Past Medical History:  Diagnosis Date   Allergy    Anemia 07/20/2018   Endometrial adenocarcinoma ([place]) 07/2018   with chemo and rad tx   Fibroid uterus 07/24/2018   5cm/2 inch    Ovarian mass 07/24/2018   22cm/10 inch   Pelvic mass    Personal history of chemotherapy 2019   Personal history of radiation therapy 2020    Past Surgical History:  Procedure Laterality Date   ABDOMINAL HYSTERECTOMY  10/2018   COLONOSCOPY WITH PROPOFOL N/A 10/31/2019   Procedure: COLONOSCOPY WITH PROPOFOL;  Surgeon: Virgel Manifold, MD;  Location: [facility] ENDOSCOPY;  Service: Endoscopy;  Laterality: N/A;   EYE SURGERY  2000   Lasik   IVC FILTER REMOVAL Right 02/07/2019   Procedure: IVC FILTER REMOVAL;  Surgeon: Algernon Huxley, MD;  Location: [place] [facility];  Service: Cardiovascular;  Laterality: Right;   PORTA CATH INSERTION N/A 08/13/2018   Procedure: PORTA CATH INSERTION;  Surgeon: Algernon Huxley, MD;  Location: [place] [facility];  Service: Cardiovascular;  Laterality: N/A;   PORTA CATH REMOVAL N/A 09/03/2020   Procedure: PORTA CATH REMOVAL;  Surgeon: Algernon Huxley, MD;  Location: [place] [facility];  Service: Cardiovascular;  Laterality: N/A;   Removal of IVC filter   02/07/2019   chemo completed     There were no vitals filed for this  visit.   Subjective Assessment - 09/09/20 1113    Subjective Pt notices her sense of urinary urgency is less than what it was. Even when she has leakage, it is less than what it was.              [facility] PT Assessment - 09/09/20 1119      Observation/Other Assessments   Observations R shoulder slightly lowered than L       Palpation   Spinal mobility no R thoracic convex.  no L convex lumbar curve, R shoulder slightly lower.     Palpation comment scapular downward rotation delayed on R ,  ( post Tx: improved mobility on R, but will still need more manual Tx at next session)      Ambulation/Gait   Gait Comments slower gait causess more medial thigh pain                          [facility] Adult PT Treatment/Exercise - 09/09/20 1335      Therapeutic Activites    Therapeutic Activities Other Therapeutic Activities      Neuro Re-ed    Neuro Re-ed Details  excessive cues for log rolling,        Moist Heat Therapy   Moist Heat Location --   thoracic  over folded towel lengthwise for pect stretch     Manual Therapy   Manual therapy comments PA mob T10-12, interspinal R, medial scapula mm B , PA mob at interspinal on L and L SIJ                       PT Long Term Goals - 09/01/20 1551      PT LONG TERM GOAL #1   Title Pt will increase her walking speed from 1.09 m/s to > 1.13 m/s in order to ambulate safely in community and regain "trust in her R leg"    Time 4    Period Weeks    Status On-going      PT LONG TERM GOAL #2   Title Pt will increase R knee ext from 140 deg to > 160 deg  and R hip ext -20 deg to > 0 deg in order to improve gait and minimize back/ hip pain    Baseline R hip ext -20 deg.  R knee ext lacking ( 140 deg flexion in supine), L 180 deg)    Time 6    Period Weeks    Status On-going      PT LONG TERM GOAL #3   Title Pt will demo equal iliac crest height and increased SIJ mobility across 2 visits in order to progress to deep core /  thoracolumabar/ hip strengthening exercises with greater outcomes    Time 2    Period Weeks    Status Achieved      PT LONG TERM GOAL #4   Title Pt will demo increased FOTO score from 42 pts to > 40 pts in order to demo improved functional mobility    Time 10    Period Weeks    Status On-going      PT LONG TERM GOAL #5   Title Pt will report no R hip pain when stepping, lifting R LE, and getting up after sitting in order to increase functional mobility in ADLs and community    Time 10    Period Weeks    Status On-going      PT LONG TERM GOAL #6   Title Pt will demo proper pelvic floor coordination and report decreased SUI by 50% in order to promote pelvic floor function    Time 8    Period Weeks    Status On-going                 Plan - 09/09/20 1336    Clinical Impression Statement Pt required further manual Tx to minimize thoracic kyphosis. Plan to continue addressing forward head posture to optimize pelvic floor ./ GI function given pt's surgeries over the abdominopelvic area 2/2 to cancer. Applied biopsychosocial approaches to help pt with cancer survivorship journey. Pt is making progress with urinary urgency and leakage. At next session, plan to educate the use of hiking poles to minimize weight bearing on severe OA of R hip.  Pt continues to benefit from skilled PT.    Personal Factors and Comorbidities Comorbidity 2    Comorbidities Stage III uterine cancer and Tx includes removal of uterus surgrically, 6 chemotherapy Tx, 25 radiation to abdomen, 6 internal radiation Tx. Medical Records include: History of deep vein thrombosis of lower extremity.Endometrial adenocarcinoma. Bilateral pulmonary embolism    Stability/Clinical Decision Making Evolving/Moderate complexity    Rehab Potential Good    PT Frequency 2x / week    PT Duration Other (  comment)   10   PT Treatment/Interventions Gait training;Balance training;Neuromuscular re-education;Moist Heat;Functional mobility  training;Therapeutic activities;Therapeutic exercise;Manual techniques;Patient/family education;Scar mobilization;Cryotherapy;Taping;Splinting;Energy conservation    Consulted and Agree with Plan of Care Patient           Patient will benefit from skilled therapeutic intervention in order to improve the following deficits and impairments:  Decreased scar mobility, Decreased mobility, Decreased coordination, Abnormal gait, Improper body mechanics, Pain, Increased muscle spasms, Hypermobility, Decreased endurance, Decreased strength, Hypomobility, Decreased balance, Decreased activity tolerance, Decreased safety awareness, Decreased knowledge of precautions, Increased fascial restricitons, Impaired sensation, Postural dysfunction, Difficulty walking, Decreased range of motion  Visit Diagnosis: Other abnormalities of gait and mobility  Muscle weakness (generalized)  Other lack of coordination     Problem List Patient Active Problem List   Diagnosis Date Noted   Chronic anticoagulation 01/02/2019   Encounter for antineoplastic chemotherapy 01/02/2019   Neuropathy due to chemotherapeutic drug ([place]) 01/02/2019   S/P insertion of IVC (inferior vena caval) filter 09/26/2018   Bilateral pulmonary embolism ([place]) 09/22/2018   DVT (deep venous thrombosis) ([facility]) 09/22/2018   Anxiety associated with cancer diagnosis 09/07/2018   Recurrent pleural effusion on right 09/07/2018   Chemotherapy-induced peripheral neuropathy ([place]) 09/07/2018   Iron deficiency anemia due to chronic blood loss 08/10/2018   Goals of care, counseling/discussion 08/10/2018   Endometrial adenocarcinoma ([place]) 08/10/2018   Arthritis 12/01/2015   Secondary pulmonary hypertension 11/24/2015   Allergic rhinitis 03/02/2010   Varicose veins of lower extremities with inflammation 01/06/2010   Chronic venous insufficiency 01/06/2010   Nonrheumatic tricuspid valve disorder 12/30/2009   Fibroid 12/09/2009    Family history of malignant neoplasm of ovary 12/02/2009    Jerl Mina ,PT, DPT, E-RYT  09/09/2020, 1:37 PM  [place] [address] [place], [place], [address] Phone: [phone]   Fax:  [phone]  Name: Karen Mann MRN: [id number] Date of Birth: [birth year]

## 2020-09-14 ENCOUNTER — Ambulatory Visit: Payer: 59 | Admitting: Physical Therapy

## 2020-09-14 ENCOUNTER — Other Ambulatory Visit: Payer: Self-pay

## 2020-09-14 DIAGNOSIS — R2689 Other abnormalities of gait and mobility: Secondary | ICD-10-CM

## 2020-09-14 DIAGNOSIS — R278 Other lack of coordination: Secondary | ICD-10-CM

## 2020-09-14 DIAGNOSIS — M6281 Muscle weakness (generalized): Secondary | ICD-10-CM

## 2020-09-14 NOTE — Patient Instructions (Signed)
Body scan, mindfulness research  Emailed

## 2020-09-14 NOTE — Therapy (Signed)
[place] MAIN [facility] SERVICES [address] [place], [place], [address] Phone: [phone]   Fax:  [phone]  Physical Therapy Treatment  Patient Details  Name: Karen Mann MRN: [id number] Date of Birth: July 04, 1961 Referring Provider (PT): Laqueta Jean Date: 09/14/2020   PT End of Session - 09/14/20 1417    Visit Number 12    Number of Visits 20    Date for PT Re-Evaluation 11/10/20    PT Start Time 2831    PT Stop Time 1400    PT Time Calculation (min) 57 min    Activity Tolerance Patient tolerated treatment well    Behavior During Therapy [facility] for tasks assessed/performed           Past Medical History:  Diagnosis Date  . Allergy   . Anemia 07/20/2018  . Endometrial adenocarcinoma ([place]) 07/2018   with chemo and rad tx  . Fibroid uterus 07/24/2018   5cm/2 inch   . Ovarian mass 07/24/2018   22cm/10 inch  . Pelvic mass   . Personal history of chemotherapy 2019  . Personal history of radiation therapy 2020    Past Surgical History:  Procedure Laterality Date  . ABDOMINAL HYSTERECTOMY  10/2018  . COLONOSCOPY WITH PROPOFOL N/A 10/31/2019   Procedure: COLONOSCOPY WITH PROPOFOL;  Surgeon: Virgel Manifold, MD;  Location: [facility] ENDOSCOPY;  Service: Endoscopy;  Laterality: N/A;  . EYE SURGERY  2000   Lasik  . IVC FILTER REMOVAL Right 02/07/2019   Procedure: IVC FILTER REMOVAL;  Surgeon: Algernon Huxley, MD;  Location: [place] [facility];  Service: Cardiovascular;  Laterality: Right;  . PORTA CATH INSERTION N/A 08/13/2018   Procedure: PORTA CATH INSERTION;  Surgeon: Algernon Huxley, MD;  Location: [place] [facility];  Service: Cardiovascular;  Laterality: N/A;  . PORTA CATH REMOVAL N/A 09/03/2020   Procedure: PORTA CATH REMOVAL;  Surgeon: Algernon Huxley, MD;  Location: [place] [facility];  Service: Cardiovascular;  Laterality: N/A;  . Removal of IVC filter   02/07/2019   chemo completed     There were no vitals filed for this  visit.   Subjective Assessment - 09/14/20 1307    Subjective Pt has decided to undergo THA on RLE on Oct 14. Pt wants to go to to [facility] for grandkids in Dec.              [facility] PT Assessment - 09/14/20 1415      Ambulation/Gait   Gait Comments less forward head                          [facility] Adult PT Treatment/Exercise - 09/14/20 1331      Therapeutic Activites    Therapeutic Activities Other Therapeutic Activities    Other Therapeutic Activities discussed and showed / explained  anatomy/physiology role of pelvic floor and hip, explained pain science for greater outcome following pending  post THA        Neuro Re-ed    Neuro Re-ed Details  guided body scan, mindfulness.                        PT Long Term Goals - 09/01/20 1551      PT LONG TERM GOAL #1   Title Pt will increase her walking speed from 1.09 m/s to > 1.13 m/s in order to ambulate safely in community and regain "trust in  her R leg"    Time 4    Period Weeks    Status On-going      PT LONG TERM GOAL #2   Title Pt will increase R knee ext from 140 deg to > 160 deg  and R hip ext -20 deg to > 0 deg in order to improve gait and minimize back/ hip pain    Baseline R hip ext -20 deg.  R knee ext lacking ( 140 deg flexion in supine), L 180 deg)    Time 6    Period Weeks    Status On-going      PT LONG TERM GOAL #3   Title Pt will demo equal iliac crest height and increased SIJ mobility across 2 visits in order to progress to deep core / thoracolumabar/ hip strengthening exercises with greater outcomes    Time 2    Period Weeks    Status Achieved      PT LONG TERM GOAL #4   Title Pt will demo increased FOTO score from 42 pts to > 40 pts in order to demo improved functional mobility    Time 10    Period Weeks    Status On-going      PT LONG TERM GOAL #5   Title Pt will report no R hip pain when stepping, lifting R LE, and getting up after sitting in order to increase functional  mobility in ADLs and community    Time 10    Period Weeks    Status On-going      PT LONG TERM GOAL #6   Title Pt will demo proper pelvic floor coordination and report decreased SUI by 50% in order to promote pelvic floor function    Time 8    Period Weeks    Status On-going                 Plan - 09/14/20 1418    Clinical Impression Statement Pt demo'd significantly less forward head posture with more spinal mobility.   Discussed anatomy and physiology about the role of pelvic floor for urinary Sx and hip stability.   Provided pt pain science education and mindfulness training to yield better outcome post pending THA surgery scheduled on 10/08/20 with Dr. Harlow Mares at [facility].    Plan to perform pelvic floor at upcoming session and pt reported she will need more time to be ready for the internal assessment.   Plan to perform hip ext/ quad/ hip abd/ PF / ankle strengthening and body mechanics in preparation for hip surgery.  Pt continues to benefits from skilled PT.       Personal Factors and Comorbidities Comorbidity 2    Comorbidities Stage III uterine cancer and Tx includes removal of uterus surgrically, 6 chemotherapy Tx, 25 radiation to abdomen, 6 internal radiation Tx. Medical Records include: History of deep vein thrombosis of lower extremity.Endometrial adenocarcinoma. Bilateral pulmonary embolism    Stability/Clinical Decision Making Evolving/Moderate complexity    Rehab Potential Good    PT Frequency 2x / week    PT Duration Other (comment)   10   PT Treatment/Interventions Gait training;Balance training;Neuromuscular re-education;Moist Heat;Functional mobility training;Therapeutic activities;Therapeutic exercise;Manual techniques;Patient/family education;Scar mobilization;Cryotherapy;Taping;Splinting;Energy conservation    Consulted and Agree with Plan of Care Patient           Patient will benefit from skilled therapeutic intervention in order to improve the  following deficits and impairments:  Decreased scar mobility, Decreased mobility, Decreased coordination, Abnormal gait, Improper  body mechanics, Pain, Increased muscle spasms, Hypermobility, Decreased endurance, Decreased strength, Hypomobility, Decreased balance, Decreased activity tolerance, Decreased safety awareness, Decreased knowledge of precautions, Increased fascial restricitons, Impaired sensation, Postural dysfunction, Difficulty walking, Decreased range of motion  Visit Diagnosis: Other abnormalities of gait and mobility  Muscle weakness (generalized)  Other lack of coordination     Problem List Patient Active Problem List   Diagnosis Date Noted  . Chronic anticoagulation 01/02/2019  . Encounter for antineoplastic chemotherapy 01/02/2019  . Neuropathy due to chemotherapeutic drug ([place]) 01/02/2019  . S/P insertion of IVC (inferior vena caval) filter 09/26/2018  . Bilateral pulmonary embolism ([place]) 09/22/2018  . DVT (deep venous thrombosis) ([place]) 09/22/2018  . Anxiety associated with cancer diagnosis 09/07/2018  . Recurrent pleural effusion on right 09/07/2018  . Chemotherapy-induced peripheral neuropathy ([place]) 09/07/2018  . Iron deficiency anemia due to chronic blood loss 08/10/2018  . Goals of care, counseling/discussion 08/10/2018  . Endometrial adenocarcinoma ([place]) 08/10/2018  . Arthritis 12/01/2015  . Secondary pulmonary hypertension 11/24/2015  . Allergic rhinitis 03/02/2010  . Varicose veins of lower extremities with inflammation 01/06/2010  . Chronic venous insufficiency 01/06/2010  . Nonrheumatic tricuspid valve disorder 12/30/2009  . Fibroid 12/09/2009  . Family history of malignant neoplasm of ovary 12/02/2009    Jerl Mina ,PT, DPT, E-RYT  09/14/2020, 2:21 PM  [place] MAIN [facility] SERVICES [address] [place], [place], [address] Phone: [phone]   Fax:  [phone]  Name: Karen Mann MRN:  [id number] Date of Birth: [birth year]

## 2020-09-16 ENCOUNTER — Encounter: Payer: 59 | Admitting: Physical Therapy

## 2020-09-21 ENCOUNTER — Other Ambulatory Visit: Payer: Self-pay

## 2020-09-21 ENCOUNTER — Ambulatory Visit: Payer: 59 | Admitting: Physical Therapy

## 2020-09-21 DIAGNOSIS — R278 Other lack of coordination: Secondary | ICD-10-CM

## 2020-09-21 DIAGNOSIS — R2689 Other abnormalities of gait and mobility: Secondary | ICD-10-CM

## 2020-09-21 DIAGNOSIS — M6281 Muscle weakness (generalized): Secondary | ICD-10-CM

## 2020-09-21 NOTE — Therapy (Signed)
[place] MAIN [facility] SERVICES [address] [place], [place], [address] Phone: [phone]   Fax:  [phone]  Physical Therapy Treatment  Patient Details  Name: Karen Mann MRN: [id number] Date of Birth: June 01, 1961 Referring Provider (PT): Laqueta Jean Date: 09/21/2020   PT End of Session - 09/21/20 0903    Visit Number 13    Number of Visits 20    Date for PT Re-Evaluation 11/10/20    PT Start Time 0805    PT Stop Time 0903    PT Time Calculation (min) 58 min    Activity Tolerance Patient tolerated treatment well    Behavior During Therapy [facility] for tasks assessed/performed           Past Medical History:  Diagnosis Date  . Allergy   . Anemia 07/20/2018  . Endometrial adenocarcinoma ([place]) 07/2018   with chemo and rad tx  . Fibroid uterus 07/24/2018   5cm/2 inch   . Ovarian mass 07/24/2018   22cm/10 inch  . Pelvic mass   . Personal history of chemotherapy 2019  . Personal history of radiation therapy 2020    Past Surgical History:  Procedure Laterality Date  . ABDOMINAL HYSTERECTOMY  10/2018  . COLONOSCOPY WITH PROPOFOL N/A 10/31/2019   Procedure: COLONOSCOPY WITH PROPOFOL;  Surgeon: Virgel Manifold, MD;  Location: [facility] ENDOSCOPY;  Service: Endoscopy;  Laterality: N/A;  . EYE SURGERY  2000   Lasik  . IVC FILTER REMOVAL Right 02/07/2019   Procedure: IVC FILTER REMOVAL;  Surgeon: Algernon Huxley, MD;  Location: [place] [facility];  Service: Cardiovascular;  Laterality: Right;  . PORTA CATH INSERTION N/A 08/13/2018   Procedure: PORTA CATH INSERTION;  Surgeon: Algernon Huxley, MD;  Location: [place] [facility];  Service: Cardiovascular;  Laterality: N/A;  . PORTA CATH REMOVAL N/A 09/03/2020   Procedure: PORTA CATH REMOVAL;  Surgeon: Algernon Huxley, MD;  Location: [place] [facility];  Service: Cardiovascular;  Laterality: N/A;  . Removal of IVC filter   02/07/2019   chemo completed     There were no vitals filed for this  visit.   Subjective Assessment - 09/21/20 0813    Subjective Pt feels tight this morning.                          Pelvic Floor Special Questions - 09/21/20 0904    External Perineal Exam with undergarment, tightness at anterior pelvic floor triangle B, obt int / coccygeus B               [facility] Adult PT Treatment/Exercise - 09/21/20 7673      Neuro Re-ed    Neuro Re-ed Details  cued for less tighting of glut with pelvic tilts, lengthening of pelvic floor on inhalation, new HEP technqiue      Modalities   Modalities Moist Heat      Moist Heat Therapy   Number Minutes Moist Heat 5 Minutes    Moist Heat Location --   perineum      Manual Therapy   Manual therapy comments external; STM/MWM at  anterior pelvic floor triangle B, obt int / coccygeus B w/ cues for  less glut in pelvic tilts                        PT Long Term Goals - 09/01/20 1551      PT  LONG TERM GOAL #1   Title Pt will increase her walking speed from 1.09 m/s to > 1.13 m/s in order to ambulate safely in community and regain "trust in her R leg"    Time 4    Period Weeks    Status On-going      PT LONG TERM GOAL #2   Title Pt will increase R knee ext from 140 deg to > 160 deg  and R hip ext -20 deg to > 0 deg in order to improve gait and minimize back/ hip pain    Baseline R hip ext -20 deg.  R knee ext lacking ( 140 deg flexion in supine), L 180 deg)    Time 6    Period Weeks    Status On-going      PT LONG TERM GOAL #3   Title Pt will demo equal iliac crest height and increased SIJ mobility across 2 visits in order to progress to deep core / thoracolumabar/ hip strengthening exercises with greater outcomes    Time 2    Period Weeks    Status Achieved      PT LONG TERM GOAL #4   Title Pt will demo increased FOTO score from 42 pts to > 40 pts in order to demo improved functional mobility    Time 10    Period Weeks    Status On-going      PT LONG TERM GOAL #5   Title Pt  will report no R hip pain when stepping, lifting R LE, and getting up after sitting in order to increase functional mobility in ADLs and community    Time 10    Period Weeks    Status On-going      PT LONG TERM GOAL #6   Title Pt will demo proper pelvic floor coordination and report decreased SUI by 50% in order to promote pelvic floor function    Time 8    Period Weeks    Status On-going                 Plan - 09/21/20 0955    Clinical Impression Statement Pt demo'd decreased pelvic floor mm tightness post external Tx. Pt achieved improved propioception of pelvic lengthening post Tx. Pt continues to benefit from skilled PT.     Personal Factors and Comorbidities Comorbidity 2    Comorbidities Stage III uterine cancer and Tx includes removal of uterus surgrically, 6 chemotherapy Tx, 25 radiation to abdomen, 6 internal radiation Tx. Medical Records include: History of deep vein thrombosis of lower extremity.Endometrial adenocarcinoma. Bilateral pulmonary embolism    Stability/Clinical Decision Making Evolving/Moderate complexity    Rehab Potential Good    PT Frequency 2x / week    PT Duration Other (comment)   10   PT Treatment/Interventions Gait training;Balance training;Neuromuscular re-education;Moist Heat;Functional mobility training;Therapeutic activities;Therapeutic exercise;Manual techniques;Patient/family education;Scar mobilization;Cryotherapy;Taping;Splinting;Energy conservation    Consulted and Agree with Plan of Care Patient           Patient will benefit from skilled therapeutic intervention in order to improve the following deficits and impairments:  Decreased scar mobility, Decreased mobility, Decreased coordination, Abnormal gait, Improper body mechanics, Pain, Increased muscle spasms, Hypermobility, Decreased endurance, Decreased strength, Hypomobility, Decreased balance, Decreased activity tolerance, Decreased safety awareness, Decreased knowledge of precautions,  Increased fascial restricitons, Impaired sensation, Postural dysfunction, Difficulty walking, Decreased range of motion  Visit Diagnosis: Muscle weakness (generalized)  Other abnormalities of gait and mobility  Other lack of coordination  Problem List Patient Active Problem List   Diagnosis Date Noted  . Chronic anticoagulation 01/02/2019  . Encounter for antineoplastic chemotherapy 01/02/2019  . Neuropathy due to chemotherapeutic drug ([place]) 01/02/2019  . S/P insertion of IVC (inferior vena caval) filter 09/26/2018  . Bilateral pulmonary embolism ([place]) 09/22/2018  . DVT (deep venous thrombosis) ([place]) 09/22/2018  . Anxiety associated with cancer diagnosis 09/07/2018  . Recurrent pleural effusion on right 09/07/2018  . Chemotherapy-induced peripheral neuropathy ([place]) 09/07/2018  . Iron deficiency anemia due to chronic blood loss 08/10/2018  . Goals of care, counseling/discussion 08/10/2018  . Endometrial adenocarcinoma ([place]) 08/10/2018  . Arthritis 12/01/2015  . Secondary pulmonary hypertension 11/24/2015  . Allergic rhinitis 03/02/2010  . Varicose veins of lower extremities with inflammation 01/06/2010  . Chronic venous insufficiency 01/06/2010  . Nonrheumatic tricuspid valve disorder 12/30/2009  . Fibroid 12/09/2009  . Family history of malignant neoplasm of ovary 12/02/2009    Jerl Mina ,PT, DPT, E-RYT  09/21/2020, 9:56 AM  [place] MAIN [facility] SERVICES [address] [place], [place], [address] Phone: [phone]   Fax:  [phone]  Name: ANNIAH GLICK MRN: [id number] Date of Birth: [birth year]

## 2020-09-21 NOTE — Patient Instructions (Signed)
Abdominal massage upward from L,  R , center to belly button 3 stroke x 3 , pressure is gentle and light with all fingers flat, not using finger tips   __  Stretch for pelvic floor   V- slides  "v heels slide away and then back toward buttocks and then rock knee to slight ,  slide heel along at 11 o clock away from buttocks   10 reps    Pelvic tilts, pressing feet    _-  NOtice feet are planted hip width apart   __  Notice the pelvic basket drop on inhale with Deep core ex

## 2020-09-22 ENCOUNTER — Encounter: Payer: 59 | Admitting: Physical Therapy

## 2020-09-23 ENCOUNTER — Encounter: Payer: 59 | Admitting: Physical Therapy

## 2020-09-23 ENCOUNTER — Telehealth: Payer: Self-pay | Admitting: Oncology

## 2020-09-23 NOTE — Telephone Encounter (Signed)
Patient phoned on this date and cancelled port flush as she no longer has a port.

## 2020-09-24 ENCOUNTER — Ambulatory Visit: Payer: 59 | Admitting: Physical Therapy

## 2020-09-24 ENCOUNTER — Other Ambulatory Visit: Payer: Self-pay

## 2020-09-24 DIAGNOSIS — R2689 Other abnormalities of gait and mobility: Secondary | ICD-10-CM | POA: Diagnosis not present

## 2020-09-24 DIAGNOSIS — R278 Other lack of coordination: Secondary | ICD-10-CM

## 2020-09-24 DIAGNOSIS — M6281 Muscle weakness (generalized): Secondary | ICD-10-CM

## 2020-09-24 NOTE — Patient Instructions (Addendum)
Prehab modification to locaust  Seated,  Hands and arms extended press down into chair chin tuck  5 sec holds,. 10 reps x 3    __   Clam Shell 45 Degrees  Lying with hips and knees bent 45, one pillow between knees and ankles. Heel together, toes apart like ballerina,  Lift knee with exhale while pressing heels together. Be sure pelvis does not roll backward. Do not arch back. Do 20 times, each leg, 2 times per day.    ___   Squeeze pelvic floor before sneezing/ coughing

## 2020-09-24 NOTE — Therapy (Signed)
[place] MAIN [facility] SERVICES [address] [place], [place], [address] Phone: [phone]   Fax:  [phone]  Physical Therapy Treatment  Patient Details  Name: Karen Mann MRN: [id number] Date of Birth: [birth year] Referring Provider (PT): Laqueta Jean Date: 09/24/2020   PT End of Session - 09/24/20 0910    Visit Number 14    Number of Visits 20    Date for PT Re-Evaluation 11/10/20    PT Start Time 0903    PT Stop Time 1000    PT Time Calculation (min) 57 min    Activity Tolerance Patient tolerated treatment well    Behavior During Therapy [facility] for tasks assessed/performed           Past Medical History:  Diagnosis Date  . Allergy   . Anemia 07/20/2018  . Endometrial adenocarcinoma ([place]) 07/2018   with chemo and rad tx  . Fibroid uterus 07/24/2018   5cm/2 inch   . Ovarian mass 07/24/2018   22cm/10 inch  . Pelvic mass   . Personal history of chemotherapy 2019  . Personal history of radiation therapy 2020    Past Surgical History:  Procedure Laterality Date  . ABDOMINAL HYSTERECTOMY  10/2018  . COLONOSCOPY WITH PROPOFOL N/A 10/31/2019   Procedure: COLONOSCOPY WITH PROPOFOL;  Surgeon: Virgel Manifold, MD;  Location: [facility] ENDOSCOPY;  Service: Endoscopy;  Laterality: N/A;  . EYE SURGERY  2000   Lasik  . IVC FILTER REMOVAL Right 02/07/2019   Procedure: IVC FILTER REMOVAL;  Surgeon: Algernon Huxley, MD;  Location: [place] CV LAB;  Service: Cardiovascular;  Laterality: Right;  . PORTA CATH INSERTION N/A 08/13/2018   Procedure: PORTA CATH INSERTION;  Surgeon: Algernon Huxley, MD;  Location: [place] CV LAB;  Service: Cardiovascular;  Laterality: N/A;  . PORTA CATH REMOVAL N/A 09/03/2020   Procedure: PORTA CATH REMOVAL;  Surgeon: Algernon Huxley, MD;  Location: [place] CV LAB;  Service: Cardiovascular;  Laterality: N/A;  . Removal of IVC filter   02/07/2019   chemo completed     There were no vitals filed for this  visit.   Subjective Assessment - 09/24/20 0910    Subjective Pt tried to pay attention to relaxing her pelvic floor. Pt was a touch sore in the area especially above the pubic bone.              [facility] PT Assessment - 09/24/20 1600      AROM   Overall AROM Comments hip ext R  -10 deg       Strength   Overall Strength Comments hip abd B 3+/5 , h ip ext L 4/5 , R limited by limited ROM with overuse of erector spinae                      Pelvic Floor Special Questions - 09/24/20 0912    External Perineal Exam through clothing, no tenderness/ tightness at mm.  proper lengthening              [facility] Adult PT Treatment/Exercise - 09/24/20 1601      Therapeutic Activites    Other Therapeutic Activities reassessed goals, communicated with other PTs on prehab for R THA       Neuro Re-ed    Neuro Re-ed Details  cued for clam shells  PT Long Term Goals - 09/24/20 1610      PT LONG TERM GOAL #1   Title Pt will increase her walking speed from 1.09 m/s to > 1.13 m/s in order to ambulate safely in community and regain "trust in her R leg"    Time 4    Period Weeks    Status On-going      PT LONG TERM GOAL #2   Title Pt will increase R knee ext from 140 deg to > 160 deg  and R hip ext -20 deg to > 0 deg in order to improve gait and minimize back/ hip pain    Baseline R hip ext -20 deg.  R knee ext lacking ( 140 deg flexion in supine), L 180 deg)  ( 9/30: R knee ext 165 deg extension, hip ext -10 deg )    Time 6    Period Weeks    Status Partially Met      PT LONG TERM GOAL #3   Title Pt will demo equal iliac crest height and increased SIJ mobility across 2 visits in order to progress to deep core / thoracolumabar/ hip strengthening exercises with greater outcomes    Time 2    Period Weeks    Status Achieved      PT LONG TERM GOAL #4   Title Pt will demo increased FOTO score from 42 pts to > 40 pts in order to demo improved  functional mobility    Time 10    Period Weeks    Status On-going      PT LONG TERM GOAL #5   Title Pt will report no R hip pain when stepping, lifting R LE, and getting up after sitting in order to increase functional mobility in ADLs and community ( not met, pain is related severe OA and requires THA)    Time 10    Period Weeks    Status Not Met      PT LONG TERM GOAL #6   Title Pt will demo proper pelvic floor coordination and report decreased SUI by 50% in order to promote pelvic floor function    Time 8    Period Weeks    Status Achieved                 Plan - 09/24/20 1601    Clinical Impression Statement Pt demonstrated no tightness/ tenderness at pelvic floor mm today which is good carry over from last session. Progressed pt to hip abduction strengthening to prepare for THA surgery.  Hip extension strengthening will require modification as pt has limited hip extensions but has improved slightly on R. Plan to progress to pelvic floor endurance and quick contraction training at next session. Plan to address lower kinetic chain chain.  Modified past HEP that was performed in prone to seated position so pt can continue to perform during post THA surgery phase.  Pt continues to benefit from skilled PT.    Personal Factors and Comorbidities Comorbidity 2    Comorbidities Stage III uterine cancer and Tx includes removal of uterus surgrically, 6 chemotherapy Tx, 25 radiation to abdomen, 6 internal radiation Tx. Medical Records include: History of deep vein thrombosis of lower extremity.Endometrial adenocarcinoma. Bilateral pulmonary embolism    Stability/Clinical Decision Making Evolving/Moderate complexity    Rehab Potential Good    PT Frequency 2x / week    PT Duration Other (comment)   10   PT Treatment/Interventions Gait training;Balance training;Neuromuscular re-education;Moist Heat;Functional  mobility training;Therapeutic activities;Therapeutic exercise;Manual  techniques;Patient/family education;Scar mobilization;Cryotherapy;Taping;Splinting;Energy conservation    Consulted and Agree with Plan of Care Patient           Patient will benefit from skilled therapeutic intervention in order to improve the following deficits and impairments:  Decreased scar mobility, Decreased mobility, Decreased coordination, Abnormal gait, Improper body mechanics, Pain, Increased muscle spasms, Hypermobility, Decreased endurance, Decreased strength, Hypomobility, Decreased balance, Decreased activity tolerance, Decreased safety awareness, Decreased knowledge of precautions, Increased fascial restricitons, Impaired sensation, Postural dysfunction, Difficulty walking, Decreased range of motion  Visit Diagnosis: Other abnormalities of gait and mobility  Muscle weakness (generalized)  Other lack of coordination     Problem List Patient Active Problem List   Diagnosis Date Noted  . Chronic anticoagulation 01/02/2019  . Encounter for antineoplastic chemotherapy 01/02/2019  . Neuropathy due to chemotherapeutic drug ([place]) 01/02/2019  . S/P insertion of IVC (inferior vena caval) filter 09/26/2018  . Bilateral pulmonary embolism ([place]) 09/22/2018  . DVT (deep venous thrombosis) ([place]) 09/22/2018  . Anxiety associated with cancer diagnosis 09/07/2018  . Recurrent pleural effusion on right 09/07/2018  . Chemotherapy-induced peripheral neuropathy ([place]) 09/07/2018  . Iron deficiency anemia due to chronic blood loss 08/10/2018  . Goals of care, counseling/discussion 08/10/2018  . Endometrial adenocarcinoma ([place]) 08/10/2018  . Arthritis 12/01/2015  . Secondary pulmonary hypertension 11/24/2015  . Allergic rhinitis 03/02/2010  . Varicose veins of lower extremities with inflammation 01/06/2010  . Chronic venous insufficiency 01/06/2010  . Nonrheumatic tricuspid valve disorder 12/30/2009  . Fibroid 12/09/2009  . Family history of malignant neoplasm of ovary 12/02/2009     Jerl Mina ,PT, DPT, E-RYT  09/24/2020, 4:02 PM  [place] MAIN [facility] SERVICES [address] [place], [place], [address] Phone: [phone]   Fax:  [phone]  Name: MAXWELL MARTORANO MRN: [id number] Date of Birth: Mar 12, 1961

## 2020-09-28 ENCOUNTER — Ambulatory Visit: Payer: 59 | Admitting: Physical Therapy

## 2020-09-29 ENCOUNTER — Encounter: Payer: 59 | Admitting: Physical Therapy

## 2020-09-29 ENCOUNTER — Ambulatory Visit: Payer: 59 | Attending: Oncology | Admitting: Physical Therapy

## 2020-09-29 ENCOUNTER — Other Ambulatory Visit: Payer: Self-pay

## 2020-09-29 DIAGNOSIS — R278 Other lack of coordination: Secondary | ICD-10-CM

## 2020-09-29 DIAGNOSIS — M6281 Muscle weakness (generalized): Secondary | ICD-10-CM | POA: Diagnosis present

## 2020-09-29 DIAGNOSIS — R2689 Other abnormalities of gait and mobility: Secondary | ICD-10-CM

## 2020-09-29 DIAGNOSIS — M533 Sacrococcygeal disorders, not elsewhere classified: Secondary | ICD-10-CM | POA: Insufficient documentation

## 2020-09-29 NOTE — Therapy (Signed)
[place] MAIN [facility] SERVICES [address] [place], [place], [address] Phone: [phone]   Fax:  [phone]  Physical Therapy Treatment  Patient Details  Name: Karen Mann MRN: [id number] Date of Birth: [birth year] Referring Provider (PT): Laqueta Jean Date: 09/29/2020   PT End of Session - 09/29/20 1448    Visit Number 15    Number of Visits 20    Date for PT Re-Evaluation 11/10/20    PT Start Time 1400    PT Stop Time 1500    PT Time Calculation (min) 60 min    Activity Tolerance Patient tolerated treatment well    Behavior During Therapy [facility] for tasks assessed/performed           Past Medical History:  Diagnosis Date  . Allergy   . Anemia 07/20/2018  . Endometrial adenocarcinoma ([place]) 07/2018   with chemo and rad tx  . Fibroid uterus 07/24/2018   5cm/2 inch   . Ovarian mass 07/24/2018   22cm/10 inch  . Pelvic mass   . Personal history of chemotherapy 2019  . Personal history of radiation therapy 2020    Past Surgical History:  Procedure Laterality Date  . ABDOMINAL HYSTERECTOMY  10/2018  . COLONOSCOPY WITH PROPOFOL N/A 10/31/2019   Procedure: COLONOSCOPY WITH PROPOFOL;  Surgeon: Virgel Manifold, MD;  Location: [facility] ENDOSCOPY;  Service: Endoscopy;  Laterality: N/A;  . EYE SURGERY  2000   Lasik  . IVC FILTER REMOVAL Right 02/07/2019   Procedure: IVC FILTER REMOVAL;  Surgeon: Algernon Huxley, MD;  Location: [place] CV LAB;  Service: Cardiovascular;  Laterality: Right;  . PORTA CATH INSERTION N/A 08/13/2018   Procedure: PORTA CATH INSERTION;  Surgeon: Algernon Huxley, MD;  Location: [place] CV LAB;  Service: Cardiovascular;  Laterality: N/A;  . PORTA CATH REMOVAL N/A 09/03/2020   Procedure: PORTA CATH REMOVAL;  Surgeon: Algernon Huxley, MD;  Location: [place] CV LAB;  Service: Cardiovascular;  Laterality: N/A;  . Removal of IVC filter   02/07/2019   chemo completed     There were no vitals filed for this  visit.   Subjective Assessment - 09/29/20 1412    Subjective Pt reported she walked in [place] for 1 hour and  noticed she she needed to lift her R leg into the car because she was tired.              [facility] PT Assessment - 09/29/20 1555      Coordination   Gross Motor Movements are Fluid and Coordinated --   upper trap overuse in upper kinetic chain exercises w/band                        [facility] Adult PT Treatment/Exercise - 09/29/20 1449      Neuro Re-ed    Neuro Re-ed Details  cued for new strengthening HEP see pt instructions , cued for more cervical/ scapular retraction      Exercises   Exercises --   hip and back strengthening in prep for THA surgery                       PT Long Term Goals - 09/24/20 0921      PT LONG TERM GOAL #1   Title Pt will increase her walking speed from 1.09 m/s to > 1.13 m/s in order to ambulate safely in community and regain "  trust in her R leg"    Time 4    Period Weeks    Status On-going      PT LONG TERM GOAL #2   Title Pt will increase R knee ext from 140 deg to > 160 deg  and R hip ext -20 deg to > 0 deg in order to improve gait and minimize back/ hip pain    Baseline R hip ext -20 deg.  R knee ext lacking ( 140 deg flexion in supine), L 180 deg)  ( 9/30: R knee ext 165 deg extension, hip ext -10 deg )    Time 6    Period Weeks    Status Partially Met      PT LONG TERM GOAL #3   Title Pt will demo equal iliac crest height and increased SIJ mobility across 2 visits in order to progress to deep core / thoracolumabar/ hip strengthening exercises with greater outcomes    Time 2    Period Weeks    Status Achieved      PT LONG TERM GOAL #4   Title Pt will demo increased FOTO score from 42 pts to > 40 pts in order to demo improved functional mobility    Time 10    Period Weeks    Status On-going      PT LONG TERM GOAL #5   Title Pt will report no R hip pain when stepping, lifting R LE, and getting up  after sitting in order to increase functional mobility in ADLs and community ( not met, pain is related severe OA and requires THA)    Time 10    Period Weeks    Status Not Met      PT LONG TERM GOAL #6   Title Pt will demo proper pelvic floor coordination and report decreased SUI by 50% in order to promote pelvic floor function    Time 8    Period Weeks    Status Achieved                 Plan - 09/29/20 1448    Clinical Impression Statement Pt progressed to hip and multifidis and upper kinetic chain strengthening with resistance band.  Pt required cervical/ scapular retraction. Multifidis exercise was performed in seated position so pt can continue to perform this exercise after surgery as this muscle will help with hip/ back mobility and function. R hip flexion eccentric strengthening was added as R hip was weaker. Pt continues to benefit from skilled PT.    Personal Factors and Comorbidities Comorbidity 2    Comorbidities Stage III uterine cancer and Tx includes removal of uterus surgrically, 6 chemotherapy Tx, 25 radiation to abdomen, 6 internal radiation Tx. Medical Records include: History of deep vein thrombosis of lower extremity.Endometrial adenocarcinoma. Bilateral pulmonary embolism    Stability/Clinical Decision Making Evolving/Moderate complexity    Rehab Potential Good    PT Frequency 2x / week    PT Duration Other (comment)   10   PT Treatment/Interventions Gait training;Balance training;Neuromuscular re-education;Moist Heat;Functional mobility training;Therapeutic activities;Therapeutic exercise;Manual techniques;Patient/family education;Scar mobilization;Cryotherapy;Taping;Splinting;Energy conservation    Consulted and Agree with Plan of Care Patient           Patient will benefit from skilled therapeutic intervention in order to improve the following deficits and impairments:  Decreased scar mobility, Decreased mobility, Decreased coordination, Abnormal gait,  Improper body mechanics, Pain, Increased muscle spasms, Hypermobility, Decreased endurance, Decreased strength, Hypomobility, Decreased balance, Decreased activity tolerance, Decreased  safety awareness, Decreased knowledge of precautions, Increased fascial restricitons, Impaired sensation, Postural dysfunction, Difficulty walking, Decreased range of motion  Visit Diagnosis: Other abnormalities of gait and mobility  Other lack of coordination     Problem List Patient Active Problem List   Diagnosis Date Noted  . Chronic anticoagulation 01/02/2019  . Encounter for antineoplastic chemotherapy 01/02/2019  . Neuropathy due to chemotherapeutic drug ([place]) 01/02/2019  . S/P insertion of IVC (inferior vena caval) filter 09/26/2018  . Bilateral pulmonary embolism ([place]) 09/22/2018  . DVT (deep venous thrombosis) ([place]) 09/22/2018  . Anxiety associated with cancer diagnosis ([place]) 09/07/2018  . Recurrent pleural effusion on right 09/07/2018  . Chemotherapy-induced peripheral neuropathy ([place]) 09/07/2018  . Iron deficiency anemia due to chronic blood loss 08/10/2018  . Goals of care, counseling/discussion 08/10/2018  . Endometrial adenocarcinoma ([place]) 08/10/2018  . Arthritis 12/01/2015  . Secondary pulmonary hypertension 11/24/2015  . Allergic rhinitis 03/02/2010  . Varicose veins of lower extremities with inflammation 01/06/2010  . Chronic venous insufficiency 01/06/2010  . Nonrheumatic tricuspid valve disorder 12/30/2009  . Fibroid 12/09/2009  . Family history of malignant neoplasm of ovary 12/02/2009    Jerl Mina ,PT, DPT, E-RYT  09/29/2020, 6:04 PM  [place] MAIN [facility] SERVICES [address] [place], [place], [address] Phone: [phone]   Fax:  [phone]  Name: Karen Mann MRN: [id number] Date of Birth: 1961-12-18

## 2020-09-29 NOTE — Patient Instructions (Signed)
Stir up presses: Band under R foot,  Elbow at 90 deg by ribs, chin tuck, hold your crown Inhale, raise thigh, exhale press foot down   30 reps   __  Multifidis twist  Band is on doorknob: stand further away from door (facing perpendicular)   Twisting trunk without moving the hips and knees Hold band at the level of ribcage, elbows bent,shoulder blades roll back and down like squeezing a pencil under armpit    Exhale twist,.10-15 deg away from door without moving your hips/ knees. Continue to maintain equal weight through legs. Keep knee unlocked.  30 reps   ( complimentary stretches:  Rainbow arms, hug a ball)    __  Arms   Wings span   Rotator cuff ( accordian)   30 reps each

## 2020-09-30 ENCOUNTER — Encounter: Payer: 59 | Admitting: Physical Therapy

## 2020-10-01 ENCOUNTER — Ambulatory Visit: Payer: 59 | Admitting: Physical Therapy

## 2020-10-01 ENCOUNTER — Other Ambulatory Visit: Payer: Self-pay

## 2020-10-01 DIAGNOSIS — R278 Other lack of coordination: Secondary | ICD-10-CM

## 2020-10-01 DIAGNOSIS — M6281 Muscle weakness (generalized): Secondary | ICD-10-CM

## 2020-10-01 DIAGNOSIS — R2689 Other abnormalities of gait and mobility: Secondary | ICD-10-CM

## 2020-10-02 NOTE — Patient Instructions (Signed)
Happy baby pose with strap

## 2020-10-02 NOTE — Therapy (Signed)
[place] MAIN [facility] SERVICES [address] [place], [place], [address] Phone: [phone]   Fax:  [phone]  Physical Therapy Treatment  Patient Details  Name: Karen Mann MRN: [id number] Date of Birth: 03/24/1961 Referring Provider (PT): Laqueta Jean Date: 10/01/2020   PT End of Session - 10/01/20 8250    Visit Number 16    Number of Visits 20    Date for PT Re-Evaluation 11/10/20    PT Start Time 1600    PT Stop Time 1644    PT Time Calculation (min) 44 min    Activity Tolerance Patient tolerated treatment well    Behavior During Therapy [facility] for tasks assessed/performed           Past Medical History:  Diagnosis Date  . Allergy   . Anemia 07/20/2018  . Endometrial adenocarcinoma ([place]) 07/2018   with chemo and rad tx  . Fibroid uterus 07/24/2018   5cm/2 inch   . Ovarian mass 07/24/2018   22cm/10 inch  . Pelvic mass   . Personal history of chemotherapy 2019  . Personal history of radiation therapy 2020    Past Surgical History:  Procedure Laterality Date  . ABDOMINAL HYSTERECTOMY  10/2018  . COLONOSCOPY WITH PROPOFOL N/A 10/31/2019   Procedure: COLONOSCOPY WITH PROPOFOL;  Surgeon: Virgel Manifold, MD;  Location: [facility] ENDOSCOPY;  Service: Endoscopy;  Laterality: N/A;  . EYE SURGERY  2000   Lasik  . IVC FILTER REMOVAL Right 02/07/2019   Procedure: IVC FILTER REMOVAL;  Surgeon: Algernon Huxley, MD;  Location: [place] [facility];  Service: Cardiovascular;  Laterality: Right;  . PORTA CATH INSERTION N/A 08/13/2018   Procedure: PORTA CATH INSERTION;  Surgeon: Algernon Huxley, MD;  Location: [place] [facility];  Service: Cardiovascular;  Laterality: N/A;  . PORTA CATH REMOVAL N/A 09/03/2020   Procedure: PORTA CATH REMOVAL;  Surgeon: Algernon Huxley, MD;  Location: [place] [facility];  Service: Cardiovascular;  Laterality: N/A;  . Removal of IVC filter   02/07/2019   chemo completed     There were no vitals filed for this  visit.   Subjective Assessment - 10/01/20 1603    Subjective Pt liked the last exercises. Pt had no pain with them.  Pt made it to the bathroom without leaking after drinking 2 cups of coffee, running errands.                          Pelvic Floor Special Questions - 10/02/20 1000    External Perineal Exam through clothing: ischiocavernosus/ bulbospongiosus B by pubic symphysis              [facility] Adult PT Treatment/Exercise - 10/02/20 1000      Neuro Re-ed    Neuro Re-ed Details  cued for pelvic floor lengthening and pelvic floor stretches       Moist Heat Therapy   Number Minutes Moist Heat 5 Minutes    Moist Heat Location --   perineum      Manual Therapy   Manual therapy comments ischiocavernosus/ bulbospongiosus B by pubic symphysis   STM/MWM to release tensions                      PT Long Term Goals - 10/01/20 1636      PT LONG TERM GOAL #1   Title Pt will increase her walking speed from 1.09 m/s  to > 1.13 m/s in order to ambulate safely in community and regain "trust in her R leg"    Time 4    Period Weeks    Status On-going      PT LONG TERM GOAL #2   Title Pt will increase R knee ext from 140 deg to > 160 deg  and R hip ext -20 deg to > 0 deg in order to improve gait and minimize back/ hip pain    Baseline R hip ext -20 deg.  R knee ext lacking ( 140 deg flexion in supine), L 180 deg)  ( 9/30: R knee ext 165 deg extension, hip ext -10 deg )    Time 6    Period Weeks    Status Partially Met      PT LONG TERM GOAL #3   Title Pt will demo equal iliac crest height and increased SIJ mobility across 2 visits in order to progress to deep core / thoracolumabar/ hip strengthening exercises with greater outcomes    Time 2    Period Weeks    Status Achieved      PT LONG TERM GOAL #4   Title Pt will demo increased FOTO score from 42 pts to > 40 pts in order to demo improved functional mobility    Time 10    Period Weeks    Status On-going       PT LONG TERM GOAL #5   Title Pt will report no R hip pain when stepping, lifting R LE, and getting up after sitting in order to increase functional mobility in ADLs and community ( not met, pain is related severe OA and requires THA)    Time 10    Period Weeks    Status Not Met      PT LONG TERM GOAL #6   Title Pt will demo proper pelvic floor coordination and report decreased SUI by 50% in order to promote pelvic floor function    Time 8    Period Weeks    Status Achieved                 Plan - 10/01/20 1635    Clinical Impression Statement Pt required external manual Tx to release mm tensions at anterior mm of pelvic floor. Following Tx, pt demo'd improved lengthening of pelvic floor. Pt was explained the importance of reverse kegels for continence. Plan to add chair yoga to help pt return to yoga but in a modified fashion as pt will undergo THA surgery in 2 weeks. Chair yoga will be a safer option while pt is healing post op.  Pt continues to benefit from skilled PT   Personal Factors and Comorbidities Comorbidity 2    Comorbidities Stage III uterine cancer and Tx includes removal of uterus surgrically, 6 chemotherapy Tx, 25 radiation to abdomen, 6 internal radiation Tx. Medical Records include: History of deep vein thrombosis of lower extremity.Endometrial adenocarcinoma. Bilateral pulmonary embolism    Stability/Clinical Decision Making Evolving/Moderate complexity    Rehab Potential Good    PT Frequency 2x / week    PT Duration Other (comment)   10   PT Treatment/Interventions Gait training;Balance training;Neuromuscular re-education;Moist Heat;Functional mobility training;Therapeutic activities;Therapeutic exercise;Manual techniques;Patient/family education;Scar mobilization;Cryotherapy;Taping;Splinting;Energy conservation    Consulted and Agree with Plan of Care Patient           Patient will benefit from skilled therapeutic intervention in order to improve the  following deficits and impairments:  Decreased scar mobility,  Decreased mobility, Decreased coordination, Abnormal gait, Improper body mechanics, Pain, Increased muscle spasms, Hypermobility, Decreased endurance, Decreased strength, Hypomobility, Decreased balance, Decreased activity tolerance, Decreased safety awareness, Decreased knowledge of precautions, Increased fascial restricitons, Impaired sensation, Postural dysfunction, Difficulty walking, Decreased range of motion  Visit Diagnosis: Other abnormalities of gait and mobility  Other lack of coordination  Muscle weakness (generalized)     Problem List Patient Active Problem List   Diagnosis Date Noted  . Chronic anticoagulation 01/02/2019  . Encounter for antineoplastic chemotherapy 01/02/2019  . Neuropathy due to chemotherapeutic drug ([place]) 01/02/2019  . S/P insertion of IVC (inferior vena caval) filter 09/26/2018  . Bilateral pulmonary embolism ([place]) 09/22/2018  . DVT (deep venous thrombosis) ([place]) 09/22/2018  . Anxiety associated with cancer diagnosis ([place]) 09/07/2018  . Recurrent pleural effusion on right 09/07/2018  . Chemotherapy-induced peripheral neuropathy ([place]) 09/07/2018  . Iron deficiency anemia due to chronic blood loss 08/10/2018  . Goals of care, counseling/discussion 08/10/2018  . Endometrial adenocarcinoma ([place]) 08/10/2018  . Arthritis 12/01/2015  . Secondary pulmonary hypertension 11/24/2015  . Allergic rhinitis 03/02/2010  . Varicose veins of lower extremities with inflammation 01/06/2010  . Chronic venous insufficiency 01/06/2010  . Nonrheumatic tricuspid valve disorder 12/30/2009  . Fibroid 12/09/2009  . Family history of malignant neoplasm of ovary 12/02/2009    Jerl Mina ,PT, DPT, E-RYT  10/02/2020, 10:03 AM  [place] MAIN [facility] SERVICES [address] [place], [place], [address] Phone: [phone]   Fax:  [phone]  Name: NATARSHA HURWITZ MRN:  [id number] Date of Birth: 1961-05-27

## 2020-10-05 ENCOUNTER — Ambulatory Visit: Payer: 59 | Admitting: Physical Therapy

## 2020-10-05 ENCOUNTER — Other Ambulatory Visit: Payer: Self-pay

## 2020-10-05 DIAGNOSIS — R278 Other lack of coordination: Secondary | ICD-10-CM

## 2020-10-05 DIAGNOSIS — M6281 Muscle weakness (generalized): Secondary | ICD-10-CM

## 2020-10-05 DIAGNOSIS — R2689 Other abnormalities of gait and mobility: Secondary | ICD-10-CM

## 2020-10-05 NOTE — Therapy (Signed)
[place] MAIN [facility] SERVICES [address] [place], [place], [address] Phone: [phone]   Fax:  [phone]  Physical Therapy Treatment  Patient Details  Name: Karen Mann MRN: [id number] Date of Birth: 1961/04/23 Referring Provider (PT): Laqueta Jean Date: 10/05/2020    Past Medical History:  Diagnosis Date  . Allergy   . Anemia 07/20/2018  . Endometrial adenocarcinoma ([place]) 07/2018   with chemo and rad tx  . Fibroid uterus 07/24/2018   5cm/2 inch   . Ovarian mass 07/24/2018   22cm/10 inch  . Pelvic mass   . Personal history of chemotherapy 2019  . Personal history of radiation therapy 2020    Past Surgical History:  Procedure Laterality Date  . ABDOMINAL HYSTERECTOMY  10/2018  . COLONOSCOPY WITH PROPOFOL N/A 10/31/2019   Procedure: COLONOSCOPY WITH PROPOFOL;  Surgeon: Virgel Manifold, MD;  Location: [facility] ENDOSCOPY;  Service: Endoscopy;  Laterality: N/A;  . EYE SURGERY  2000   Lasik  . IVC FILTER REMOVAL Right 02/07/2019   Procedure: IVC FILTER REMOVAL;  Surgeon: Algernon Huxley, MD;  Location: [place] [facility];  Service: Cardiovascular;  Laterality: Right;  . PORTA CATH INSERTION N/A 08/13/2018   Procedure: PORTA CATH INSERTION;  Surgeon: Algernon Huxley, MD;  Location: [place] [facility];  Service: Cardiovascular;  Laterality: N/A;  . PORTA CATH REMOVAL N/A 09/03/2020   Procedure: PORTA CATH REMOVAL;  Surgeon: Algernon Huxley, MD;  Location: [place] [facility];  Service: Cardiovascular;  Laterality: N/A;  . Removal of IVC filter   02/07/2019   chemo completed     There were no vitals filed for this visit.   Subjective Assessment - 10/05/20 1202    Subjective Pt reported she stopped talking Alleve as recommended one week prior to her surgery and she realized why it was masking alot of her pain.                 Assessment: tightness of upper trap, forward head posture             [facility] Adult PT  Treatment/Exercise - 10/05/20 1249      Therapeutic Activites    Other Therapeutic Activities guided chair yoga and restortive yoga       Exercises   Other Exercises  chair yoga poses for spinal and midback flexibility                        PT Long Term Goals - 10/01/20 1636      PT LONG TERM GOAL #1   Title Pt will increase her walking speed from 1.09 m/s to > 1.13 m/s in order to ambulate safely in community and regain "trust in her R leg"    Time 4    Period Weeks    Status On-going      PT LONG TERM GOAL #2   Title Pt will increase R knee ext from 140 deg to > 160 deg  and R hip ext -20 deg to > 0 deg in order to improve gait and minimize back/ hip pain    Baseline R hip ext -20 deg.  R knee ext lacking ( 140 deg flexion in supine), L 180 deg)  ( 9/30: R knee ext 165 deg extension, hip ext -10 deg )    Time 6    Period Weeks    Status Partially Met  PT LONG TERM GOAL #3   Title Pt will demo equal iliac crest height and increased SIJ mobility across 2 visits in order to progress to deep core / thoracolumabar/ hip strengthening exercises with greater outcomes    Time 2    Period Weeks    Status Achieved      PT LONG TERM GOAL #4   Title Pt will demo increased FOTO score from 42 pts to > 40 pts in order to demo improved functional mobility    Time 10    Period Weeks    Status On-going      PT LONG TERM GOAL #5   Title Pt will report no R hip pain when stepping, lifting R LE, and getting up after sitting in order to increase functional mobility in ADLs and community ( not met, pain is related severe OA and requires THA)    Time 10    Period Weeks    Status Not Met      PT LONG TERM GOAL #6   Title Pt will demo proper pelvic floor coordination and report decreased SUI by 50% in order to promote pelvic floor function    Time 8    Period Weeks    Status Achieved                 Plan - 10/05/20 1237    Clinical Impression Statement Pt was  guided to chair yoga poses and restorative poses because pt used to do yoga but she required modifications to optimize the flexibility she gained through past sessions. Yoga poses were prescribed to maintain spinal and neck and thoracic flexibility which were areas where she had been restricted. Pt was educated the use of restorative yoga for rest , particularly as she prepares for her surgery this Thursday and following surgery to optimize healing. Pt continues to benefit from skilled PT with one more visit before d/c.    Personal Factors and Comorbidities Comorbidity 2    Comorbidities Stage III uterine cancer and Tx includes removal of uterus surgrically, 6 chemotherapy Tx, 25 radiation to abdomen, 6 internal radiation Tx. Medical Records include: History of deep vein thrombosis of lower extremity.Endometrial adenocarcinoma. Bilateral pulmonary embolism    Stability/Clinical Decision Making Evolving/Moderate complexity    Rehab Potential Good    PT Frequency 2x / week    PT Duration Other (comment)   10   PT Treatment/Interventions Gait training;Balance training;Neuromuscular re-education;Moist Heat;Functional mobility training;Therapeutic activities;Therapeutic exercise;Manual techniques;Patient/family education;Scar mobilization;Cryotherapy;Taping;Splinting;Energy conservation    Consulted and Agree with Plan of Care Patient           Patient will benefit from skilled therapeutic intervention in order to improve the following deficits and impairments:  Decreased scar mobility, Decreased mobility, Decreased coordination, Abnormal gait, Improper body mechanics, Pain, Increased muscle spasms, Hypermobility, Decreased endurance, Decreased strength, Hypomobility, Decreased balance, Decreased activity tolerance, Decreased safety awareness, Decreased knowledge of precautions, Increased fascial restricitons, Impaired sensation, Postural dysfunction, Difficulty walking, Decreased range of motion  Visit  Diagnosis: Other abnormalities of gait and mobility  Other lack of coordination  Muscle weakness (generalized)     Problem List Patient Active Problem List   Diagnosis Date Noted  . Chronic anticoagulation 01/02/2019  . Encounter for antineoplastic chemotherapy 01/02/2019  . Neuropathy due to chemotherapeutic drug ([place]) 01/02/2019  . S/P insertion of IVC (inferior vena caval) filter 09/26/2018  . Bilateral pulmonary embolism ([place]) 09/22/2018  . DVT (deep venous thrombosis) ([place]) 09/22/2018  . Anxiety associated with  cancer diagnosis ([place]) 09/07/2018  . Recurrent pleural effusion on right 09/07/2018  . Chemotherapy-induced peripheral neuropathy ([place]) 09/07/2018  . Iron deficiency anemia due to chronic blood loss 08/10/2018  . Goals of care, counseling/discussion 08/10/2018  . Endometrial adenocarcinoma ([place]) 08/10/2018  . Arthritis 12/01/2015  . Secondary pulmonary hypertension 11/24/2015  . Allergic rhinitis 03/02/2010  . Varicose veins of lower extremities with inflammation 01/06/2010  . Chronic venous insufficiency 01/06/2010  . Nonrheumatic tricuspid valve disorder 12/30/2009  . Fibroid 12/09/2009  . Family history of malignant neoplasm of ovary 12/02/2009    Jerl Mina ,PT, DPT, E-RYT  10/05/2020, 12:54 PM  [place] MAIN [facility] SERVICES [address] [place], [place], [address] Phone: [phone]   Fax:  [phone]  Name: AINSLEY DEAKINS MRN: [id number] Date of Birth: 1961-06-04

## 2020-10-05 NOTE — Patient Instructions (Addendum)
Yoga chair series    Stretches :    6 directions of the spine :  5 reps   Rotation: armswings to rotate the spine  Side flexion: arm overhead arching like a rainbow , both sides  Forward bend and strainghtening up: minisquat-  Like you are about to sit and then stand up with a slight lift in the chest    ___  Midback stretches:   Elbow circles with hands on shoulders  10 reps     Forearm, wrists, and finger stretches: Elbows straight,thumbs down, cross wrists over pull away to feel the middle of shoulder blades stretch ( wings pulling apart from each other) Then squeeze shoulder blades together and down, Bring knuckles towards your chest and end with them under chin Reverse 5 reps And switch to the other side   Self hug, to pulling over sweater over head  5reps   ___   Low back   Hands on kitchen counter/ chair     A) Pull buttocks back to lengthen spine, knees bent  3 breaths   B) Bring R hand to the L, and stretch the R side trunk  3 breaths   Brings hands to center again Do the same to the L side stretch by placing L hand on top of R   D) Modified thread the needle R hand on L thigh, L  thigh pushing out slightly as the R hands pull in,  elbow bent and pulls to theR,  Look under L armpit   Do the same to other side  ___   Upper body strengthening    One sided push up 15 reps  Elbow by ribs , hands in alignment with hips  ___   Restorative:  bolster on incline, Ankles to one side of hips  Switch both sides   End with corpse pose

## 2020-10-07 ENCOUNTER — Other Ambulatory Visit: Payer: Self-pay

## 2020-10-07 ENCOUNTER — Ambulatory Visit: Payer: 59 | Admitting: Physical Therapy

## 2020-10-07 ENCOUNTER — Encounter: Payer: 59 | Admitting: Family Medicine

## 2020-10-07 DIAGNOSIS — M6281 Muscle weakness (generalized): Secondary | ICD-10-CM

## 2020-10-07 DIAGNOSIS — R278 Other lack of coordination: Secondary | ICD-10-CM

## 2020-10-07 DIAGNOSIS — R2689 Other abnormalities of gait and mobility: Secondary | ICD-10-CM

## 2020-10-08 HISTORY — PX: TOTAL HIP ARTHROPLASTY: SHX124

## 2020-10-08 NOTE — Therapy (Signed)
[place] MAIN [facility] SERVICES [address] [place], [place], [address] Phone: [phone]   Fax:  [phone]  Physical Therapy Treatment / Discharge Summary   Patient Details  Name: Karen Mann MRN: [id number] Date of Birth: 1961/01/02 Referring Provider (PT): Laqueta Jean Date: 10/07/2020   PT End of Session - 10/08/20 1458    Visit Number 18    Number of Visits 20    Date for PT Re-Evaluation 11/10/20    PT Start Time 1200    PT Stop Time 1300    PT Time Calculation (min) 60 min    Activity Tolerance Patient tolerated treatment well    Behavior During Therapy [facility] for tasks assessed/performed           Past Medical History:  Diagnosis Date  . Allergy   . Anemia 07/20/2018  . Endometrial adenocarcinoma ([place]) 07/2018   with chemo and rad tx  . Fibroid uterus 07/24/2018   5cm/2 inch   . Ovarian mass 07/24/2018   22cm/10 inch  . Pelvic mass   . Personal history of chemotherapy 2019  . Personal history of radiation therapy 2020    Past Surgical History:  Procedure Laterality Date  . ABDOMINAL HYSTERECTOMY  10/2018  . COLONOSCOPY WITH PROPOFOL N/A 10/31/2019   Procedure: COLONOSCOPY WITH PROPOFOL;  Surgeon: Virgel Manifold, MD;  Location: [facility] ENDOSCOPY;  Service: Endoscopy;  Laterality: N/A;  . EYE SURGERY  2000   Lasik  . IVC FILTER REMOVAL Right 02/07/2019   Procedure: IVC FILTER REMOVAL;  Surgeon: Algernon Huxley, MD;  Location: [place] [facility];  Service: Cardiovascular;  Laterality: Right;  . PORTA CATH INSERTION N/A 08/13/2018   Procedure: PORTA CATH INSERTION;  Surgeon: Algernon Huxley, MD;  Location: [place] [facility];  Service: Cardiovascular;  Laterality: N/A;  . PORTA CATH REMOVAL N/A 09/03/2020   Procedure: PORTA CATH REMOVAL;  Surgeon: Algernon Huxley, MD;  Location: [place] [facility];  Service: Cardiovascular;  Laterality: N/A;  . Removal of IVC filter   02/07/2019   chemo completed     There were no  vitals filed for this visit.   Subjective Assessment - 10/08/20 1314    Subjective Pt has been busy the past few days and her surgery is tomorrow                          Pelvic Floor Special Questions - 10/08/20 1457    External Perineal Exam through clothing: no pelvic floor tightness             [facility] Adult PT Treatment/Exercise - 10/08/20 1314      Therapeutic Activites    Other Therapeutic Activities guided restorative yoga positions for pelvic floor and back mm stretching and relaxation                        PT Long Term Goals - 10/08/20 1459      PT LONG TERM GOAL #1   Title Pt will increase her walking speed from 1.09 m/s to > 1.13 m/s in order to ambulate safely in community and regain "trust in her R leg"    Time 4    Period Weeks    Status Achieved      PT LONG TERM GOAL #2   Title Pt will increase R knee ext from 140 deg to > 160 deg  and R hip ext -20 deg to > 0 deg in order to improve gait and minimize back/ hip pain    Baseline R hip ext -20 deg.  R knee ext lacking ( 140 deg flexion in supine), L 180 deg)  ( 9/30: R knee ext 165 deg extension, hip ext -10 deg )    Time 6    Period Weeks    Status Not Met      PT LONG TERM GOAL #3   Title Pt will demo equal iliac crest height and increased SIJ mobility across 2 visits in order to progress to deep core / thoracolumabar/ hip strengthening exercises with greater outcomes    Time 2    Period Weeks    Status Achieved      PT LONG TERM GOAL #4   Title Pt will demo increased FOTO score from 42 pts to > 40 pts in order to demo improved functional mobility ( 10/08/20:  69 pts)    Time 10    Period Weeks    Status Achieved      PT LONG TERM GOAL #5   Title Pt will report no R hip pain when stepping, lifting R LE, and getting up after sitting in order to increase functional mobility in ADLs and community ( not met, pain is related severe OA and requires THA)    Time 10    Period  Weeks    Status Not Met      PT LONG TERM GOAL #6   Title Pt will demo proper pelvic floor coordination and report decreased SUI by 50% in order to promote pelvic floor function    Time 8    Period Weeks    Status Achieved                 Plan - 10/08/20 1458    Clinical Impression Statement Pt achieved 4/6 goals with improved LBP ( FOTO score increased from 42 to 69 pts), decreased stress incontinence, and increased gait speed.  Pt did not met remaining goals because pt's R thigh pain was due to severe OA which was confirmed by orthopedist and imaging and pt will be undergoing THA tomorrow.  Pt is IND with HEP that helps to decrease spinal / hip / pelvic floor mm tensions. Pt is IND with chair yoga and restorative yoga that pt can apply pre- post surgery as pt has been interested in returning to yoga. Yoga postures were customized for pt's deficits and promoting balance to her previous mm tightness. Chair yoga was selected instead of standing poses due to pt's R hip OA. Pt is ready for d/c at this time.     Personal Factors and Comorbidities Comorbidity 2    Comorbidities Stage III uterine cancer and Tx includes removal of uterus surgrically, 6 chemotherapy Tx, 25 radiation to abdomen, 6 internal radiation Tx. Medical Records include: History of deep vein thrombosis of lower extremity.Endometrial adenocarcinoma. Bilateral pulmonary embolism    Stability/Clinical Decision Making Evolving/Moderate complexity    Rehab Potential Good    PT Frequency 2x / week    PT Duration Other (comment)   10   PT Treatment/Interventions Gait training;Balance training;Neuromuscular re-education;Moist Heat;Functional mobility training;Therapeutic activities;Therapeutic exercise;Manual techniques;Patient/family education;Scar mobilization;Cryotherapy;Taping;Splinting;Energy conservation    Consulted and Agree with Plan of Care Patient           Patient will benefit from skilled therapeutic intervention  in order to improve the following deficits and impairments:  Decreased scar  mobility, Decreased mobility, Decreased coordination, Abnormal gait, Improper body mechanics, Pain, Increased muscle spasms, Hypermobility, Decreased endurance, Decreased strength, Hypomobility, Decreased balance, Decreased activity tolerance, Decreased safety awareness, Decreased knowledge of precautions, Increased fascial restricitons, Impaired sensation, Postural dysfunction, Difficulty walking, Decreased range of motion  Visit Diagnosis: Other abnormalities of gait and mobility  Other lack of coordination  Muscle weakness (generalized)     Problem List Patient Active Problem List   Diagnosis Date Noted  . Chronic anticoagulation 01/02/2019  . Encounter for antineoplastic chemotherapy 01/02/2019  . Neuropathy due to chemotherapeutic drug ([place]) 01/02/2019  . S/P insertion of IVC (inferior vena caval) filter 09/26/2018  . Bilateral pulmonary embolism ([place]) 09/22/2018  . DVT (deep venous thrombosis) ([place]) 09/22/2018  . Anxiety associated with cancer diagnosis ([place]) 09/07/2018  . Recurrent pleural effusion on right 09/07/2018  . Chemotherapy-induced peripheral neuropathy ([place]) 09/07/2018  . Iron deficiency anemia due to chronic blood loss 08/10/2018  . Goals of care, counseling/discussion 08/10/2018  . Endometrial adenocarcinoma ([place]) 08/10/2018  . Arthritis 12/01/2015  . Secondary pulmonary hypertension 11/24/2015  . Allergic rhinitis 03/02/2010  . Varicose veins of lower extremities with inflammation 01/06/2010  . Chronic venous insufficiency 01/06/2010  . Nonrheumatic tricuspid valve disorder 12/30/2009  . Fibroid 12/09/2009  . Family history of malignant neoplasm of ovary 12/02/2009    Jerl Mina ,PT, DPT, E-RYT  10/08/2020, 3:03 PM  [place] MAIN [facility] SERVICES [address] [place], [place], [address] Phone: [phone]   Fax:  [phone]  Name:  Karen Mann MRN: [id number] Date of Birth: 13-Jan-1961

## 2020-10-12 ENCOUNTER — Encounter: Payer: 59 | Admitting: Physical Therapy

## 2020-10-12 DIAGNOSIS — Z96649 Presence of unspecified artificial hip joint: Secondary | ICD-10-CM | POA: Insufficient documentation

## 2020-10-14 ENCOUNTER — Encounter: Payer: 59 | Admitting: Physical Therapy

## 2020-10-19 ENCOUNTER — Encounter: Payer: 59 | Admitting: Physical Therapy

## 2020-10-21 ENCOUNTER — Encounter: Payer: 59 | Admitting: Physical Therapy

## 2020-10-27 NOTE — Addendum Note (Signed)
Addended by: Jerl Mina on: 10/27/2020 08:31 PM   Modules accepted: Orders

## 2020-11-05 ENCOUNTER — Ambulatory Visit (INDEPENDENT_AMBULATORY_CARE_PROVIDER_SITE_OTHER): Payer: 59 | Admitting: Family Medicine

## 2020-11-05 ENCOUNTER — Other Ambulatory Visit: Payer: Self-pay

## 2020-11-05 ENCOUNTER — Ambulatory Visit: Payer: Self-pay

## 2020-11-05 ENCOUNTER — Encounter: Payer: Self-pay | Admitting: Family Medicine

## 2020-11-05 VITALS — BP 127/84 | HR 81 | Temp 98.3°F | Resp 16 | Wt 243.0 lb

## 2020-11-05 DIAGNOSIS — T783XXA Angioneurotic edema, initial encounter: Secondary | ICD-10-CM | POA: Diagnosis not present

## 2020-11-05 DIAGNOSIS — Z91018 Allergy to other foods: Secondary | ICD-10-CM | POA: Diagnosis not present

## 2020-11-05 MED ORDER — EPINEPHRINE 0.3 MG/0.3ML IJ SOAJ: 0.3000 mg | INTRAMUSCULAR | 0 refills | Status: DC | PRN

## 2020-11-05 MED ORDER — PREDNISONE 5 MG PO TABS: ORAL_TABLET | ORAL | 0 refills | Status: DC

## 2020-11-05 NOTE — Patient Instructions (Signed)
Angioedema Angioedema is the sudden swelling of tissue in the body. Angioedema can affect any part of the body, but it most often affects the deeper parts of the skin, causing red, itchy patches (hives) to appear over the affected area. It often begins during the night and is found in the morning. Depending on the cause, angioedema may happen:  Only once.  Several times. It may come back in unpredictable patterns.  Repeatedly for several years. Over time, it may gradually stop coming back. Angioedema can be life-threatening if it affects the air passages that you breathe through. What are the causes? This condition may be caused by:  Foods, such as milk, eggs, shellfish, wheat, or nuts.  Certain medicines, such as ACE inhibitors, antibiotics, nonsteroidal anti-inflammatory drugs, birth control pills, or dyes used in X-rays.  Insect stings.  Infections. Angioedema can be inherited, and episodes can be triggered by:  Mild injury.  Dental work.  Surgery.  Stress.  Sudden changes in temperature.  Exercise. In some cases, the cause of this condition is not known. What are the signs or symptoms? Symptoms of this condition depend on where the swelling happens. Symptoms may include:  Swollen skin.  Red, itchy patches of skin (hives).  Redness in the affected area.  Pain in the affected area.  Swollen lips or tongue.  Wheezing.  Breathing problems.  If your internal organs are involved, symptoms may also include:  Nausea.  Abdominal pain.  Vomiting.  Difficulty swallowing.  Difficulty passing urine. How is this diagnosed? This condition may be diagnosed based on:  An exam of the affected area.  Your medical history.  Whether anyone in your family has had this condition before.  A review of any medicines you have been taking.  Tests, including: ? Allergy skin tests to see if the condition was caused by an allergic reaction. ? Blood tests to see if the  condition was caused by a gene. ? Tests to check for underlying diseases that could cause the condition. How is this treated? Treatment for this condition depends on the cause. It may involve any of the following:  If something triggered the condition, making changes to keep it from triggering the condition again.  If the condition affects your breathing, having tubes placed in your airway to keep it open.  Taking medicines to treat symptoms or prevent future episodes. These may include: ? Antihistamines. ? Epinephrine injections. ? Steroids. If your condition is severe, you may need to be treated at the hospital. Angioedema usually gets better in 24-48 hours. Follow these instructions at home:  Take over-the-counter and prescription medicines only as told by your health care provider.  If you were given medicines for emergency allergy treatment, always carry them with you.  Wear a medical bracelet as told by your health care provider.  If something triggers your condition, avoid the trigger, if possible.  If your condition is inherited and you are thinking about having children, talk to your health care provider. It is important to discuss the risks of passing on the condition to your children. Contact a health care provider if:  You have repeated episodes of angioedema.  Episodes of angioedema start to happen more often than they used to, even after you take steps to prevent them.  You have episodes of angioedema that are more severe than they have been before, even after you take steps to prevent them.  You are thinking about having children. Get help right away if:  You  have severe swelling of your mouth, tongue, or lips.  You have trouble breathing.  You have trouble swallowing.  You faint. This information is not intended to replace advice given to you by your health care provider. Make sure you discuss any questions you have with your health care provider. Document  Revised: 11/24/2017 Document Reviewed: 06/21/2016 Elsevier Patient Education  2020 [employer].

## 2020-11-05 NOTE — Progress Notes (Signed)
Established patient visit   Patient: Karen Mann   DOB: 06-08-1961   59 y.o. Female  MRN: [id number] Visit Date: 11/05/2020  Today's healthcare provider: Vernie Murders, PA   Chief Complaint  Patient presents with  . Oral Swelling   Subjective    HPI  Lip swelling: Patient complains of sudden swelling of the lips. She reports she woke up this morning like this. She has taken a dose of Zyrtec and 2 doses  Of Benadryl. Patient states swelling has started to improve.   Past Medical History:  Diagnosis Date  . Allergy   . Anemia 07/20/2018  . Endometrial adenocarcinoma ([place]) 07/2018   with chemo and rad tx  . Fibroid uterus 07/24/2018   5cm/2 inch   . Ovarian mass 07/24/2018   22cm/10 inch  . Pelvic mass   . Personal history of chemotherapy 2019  . Personal history of radiation therapy 2020   Past Surgical History:  Procedure Laterality Date  . ABDOMINAL HYSTERECTOMY  10/2018  . COLONOSCOPY WITH PROPOFOL N/A 10/31/2019   Procedure: COLONOSCOPY WITH PROPOFOL;  Surgeon: Virgel Manifold, MD;  Location: [facility] ENDOSCOPY;  Service: Endoscopy;  Laterality: N/A;  . EYE SURGERY  2000   Lasik  . IVC FILTER REMOVAL Right 02/07/2019   Procedure: IVC FILTER REMOVAL;  Surgeon: Algernon Huxley, MD;  Location: [place] [facility];  Service: Cardiovascular;  Laterality: Right;  . PORTA CATH INSERTION N/A 08/13/2018   Procedure: PORTA CATH INSERTION;  Surgeon: Algernon Huxley, MD;  Location: [place] [facility];  Service: Cardiovascular;  Laterality: N/A;  . PORTA CATH REMOVAL N/A 09/03/2020   Procedure: PORTA CATH REMOVAL;  Surgeon: Algernon Huxley, MD;  Location: [place] [facility];  Service: Cardiovascular;  Laterality: N/A;  . Removal of IVC filter   02/07/2019   chemo completed    Social History   Tobacco Use  . Smoking status: Never Smoker  . Smokeless tobacco: Never Used  Vaping Use  . Vaping Use: Never used  Substance Use Topics  . Alcohol use: No  . Drug use: No    Family History  Problem Relation Age of Onset  . Hypertension Father   . Ovarian cancer Mother 58       deceased 5  . Healthy Brother   . Skin cancer Paternal Grandmother        deceased 52s  . Cancer Paternal Grandfather        unk. primary; deceased 2s  . Ovarian cancer Other        mat grandmother's sister   No Known Allergies     Medications: Outpatient Medications Prior to Visit  Medication Sig  . aspirin EC 81 MG tablet Take 81 mg by mouth daily. Swallow whole.  . [DISCONTINUED] aspirin 81 MG chewable tablet Chew by mouth daily.   . [DISCONTINUED] ELIQUIS 2.5 MG TABS tablet TAKE 1 TABLET TWICE A DAY (Patient not taking: Reported on 07/22/2020)   No facility-administered medications prior to visit.    Review of Systems  Constitutional: Negative for appetite change, chills, fatigue and fever.  HENT:       Swelling of the lips  Respiratory: Negative for chest tightness and shortness of breath.   Cardiovascular: Negative for chest pain and palpitations.  Gastrointestinal: Negative for abdominal pain, nausea and vomiting.  Neurological: Negative for dizziness and weakness.      Objective    BP 127/84 (BP Location: Right Arm, Patient Position: Sitting)  Pulse 81   Temp 98.3 F (36.8 C) (Oral)   Resp 16   Wt 243 lb (110.2 kg)   LMP 08/13/2018 Comment: patient continues to bleed  BMI 35.88 kg/m    Physical Exam Constitutional:      General: She is not in acute distress.    Appearance: She is well-developed.  HENT:     Head: Normocephalic and atraumatic.     Right Ear: Hearing and tympanic membrane normal.     Left Ear: Hearing and tympanic membrane normal.     Nose: Nose normal.     Mouth/Throat:     Comments: Still has a little swelling of lips and pinkness around mouth and cheeks. No dyspnea or drooling now. Eyes:     General: Lids are normal. No scleral icterus.       Right eye: No discharge.        Left eye: No discharge.      Conjunctiva/sclera: Conjunctivae normal.  Pulmonary:     Effort: Pulmonary effort is normal. No respiratory distress.  Musculoskeletal:        General: Normal range of motion.  Skin:    Findings: No lesion or rash.  Neurological:     Mental Status: She is alert and oriented to person, place, and time.  Psychiatric:        Speech: Speech normal.        Behavior: Behavior normal.        Thought Content: Thought content normal.       No results found for any visits on 11/05/20.  Assessment & Plan     1. Angioedema, initial encounter Approximate time of first noticing swelling of upper and lower lips was around 9:30 am today. Decided to take Zyrtec and Benadryl instead of going to the ER as triage recommended. Denies any difficulty with breathing but had some uncontrollable drooling for a short time. Reaction has abated a great deal before coming to the office. Had a Epi-Pen that is a couple years old and did not use it. Denies any known exposure to meats with her Alpha-Gal allergy. Will treat with a prednisone taper, may continue use of antihistamines and given a fresh Epi-Pen. If reaction recurs, will need to go to the ER and possibly need referral to an allergist. - EPINEPHrine 0.3 mg/0.3 mL IJ SOAJ injection; Inject 0.3 mg into the muscle as needed for anaphylaxis.  Dispense: 2 each; Refill: 0 - predniSONE (DELTASONE) 5 MG tablet; Taper down by 1 tablet by mouth daily starting at 6 day 1, then 5 day 2, 4 day 3, 3 day 4, 2 day 5 and 1 day 6. Divide dosage among meals and bedtime.  Dispense: 21 tablet; Refill: 0  2. Allergy to alpha-gal States she had been tested 4-5 years ago and found to have alpha-gal allergy. Probably secondary to tick bites in the past. Counseled regarding meat and dairy products causing allergy reactions.   No follow-ups on file.      Andres Shad, PA, have reviewed all documentation for this visit. The documentation on 11/05/20 for the exam, diagnosis,  procedures, and orders are all accurate and complete.    Vernie Murders, [place] [phone] (phone) [phone] (fax)  [place]

## 2020-11-05 NOTE — Telephone Encounter (Signed)
Pt. States she woke up with her lips swollen. Swelling is at jaw line as well. No difficulty swallowing or shortness of breath. Has taken Benadryl 25 mg and Zyrtec.Reports she has started a new supplement recently. Appointment made. Will take another Benadryl. Instructed to call 911 for any difficulty swallowing or shortness of breath. Verbalizes understanding.  Reason for Disposition . Swelling began after taking a drug  Answer Assessment - Initial Assessment Questions 1. ONSET: "When did the swelling start?" (e.g., minutes, hours, days)     This morning 2. LOCATION: "What part of the face is swollen?"     Lips, jaw 3. SEVERITY: "How swollen is it?"     Severe 4. ITCHING: "Is there any itching?" If Yes, ask: "How much?"   (Scale 1-10; mild, moderate or severe)     No 5. PAIN: "Is the swelling painful to touch?" If Yes, ask: "How painful is it?"   (Scale 1-10; mild, moderate or severe)     No 6. FEVER: "Do you have a fever?" If Yes, ask: "What is it, how was it measured, and when did it start?"      No 7. CAUSE: "What do you think is causing the face swelling?"     Unsure 8. RECURRENT SYMPTOM: "Have you had face swelling before?" If Yes, ask: "When was the last time?" "What happened that time?"     Yes 9. OTHER SYMPTOMS: "Do you have any other symptoms?" (e.g., toothache, leg swelling)     No 10. PREGNANCY: "Is there any chance you are pregnant?" "When was your last menstrual period?"       No  Protocols used: [facility]

## 2020-11-10 ENCOUNTER — Ambulatory Visit (INDEPENDENT_AMBULATORY_CARE_PROVIDER_SITE_OTHER): Payer: 59 | Admitting: Family Medicine

## 2020-11-10 ENCOUNTER — Other Ambulatory Visit: Payer: Self-pay

## 2020-11-10 ENCOUNTER — Encounter: Payer: Self-pay | Admitting: Family Medicine

## 2020-11-10 VITALS — BP 128/90 | HR 80 | Temp 98.5°F | Resp 16 | Ht 68.5 in | Wt 237.5 lb

## 2020-11-10 DIAGNOSIS — Z6835 Body mass index (BMI) 35.0-35.9, adult: Secondary | ICD-10-CM

## 2020-11-10 DIAGNOSIS — Z0000 Encounter for general adult medical examination without abnormal findings: Secondary | ICD-10-CM

## 2020-11-10 DIAGNOSIS — E669 Obesity, unspecified: Secondary | ICD-10-CM

## 2020-11-10 DIAGNOSIS — Z1231 Encounter for screening mammogram for malignant neoplasm of breast: Secondary | ICD-10-CM | POA: Diagnosis not present

## 2020-11-10 DIAGNOSIS — Z23 Encounter for immunization: Secondary | ICD-10-CM

## 2020-11-10 NOTE — Patient Instructions (Signed)

## 2020-11-10 NOTE — Progress Notes (Signed)
Complete physical exam   Patient: Karen Mann   DOB: 06-06-1961   59 y.o. Female  MRN: [id number] Visit Date: 11/10/2020  Today's healthcare provider: Lavon Paganini, MD   Chief Complaint  Patient presents with  . Annual Exam   Subjective    Karen Mann is a 59 y.o. female who presents today for a complete physical exam.  She reports consuming a general diet. Exercise is limited by orthopedic condition(s): hip. She generally feels well. She reports sleeping well. She does not have additional problems to discuss today.  HPI  01/01/2016 Pap/HPV-negative 01/06/2020 Ma mmogram-BIRADS 1 10/31/2019 Colonoscopy  Past Medical History:  Diagnosis Date  . Allergy   . Anemia 07/20/2018  . Endometrial adenocarcinoma ([place]) 07/2018   with chemo and rad tx  . Fibroid uterus 07/24/2018   5cm/2 inch   . Ovarian mass 07/24/2018   22cm/10 inch  . Pelvic mass   . Personal history of chemotherapy 2019  . Personal history of radiation therapy 2020   Past Surgical History:  Procedure Laterality Date  . ABDOMINAL HYSTERECTOMY  10/2018  . COLONOSCOPY WITH PROPOFOL N/A 10/31/2019   Procedure: COLONOSCOPY WITH PROPOFOL;  Surgeon: Virgel Manifold, MD;  Location: [facility];  Service: Endoscopy;  Laterality: N/A;  . EYE SURGERY  2000   Lasik  . IVC FILTER REMOVAL Right 02/07/2019   Procedure: IVC FILTER REMOVAL;  Surgeon: Algernon Huxley, MD;  Location: [place] [facility];  Service: Cardiovascular;  Laterality: Right;  . PORTA CATH INSERTION N/A 08/13/2018   Procedure: PORTA CATH INSERTION;  Surgeon: Algernon Huxley, MD;  Location: [place] [facility];  Service: Cardiovascular;  Laterality: N/A;  . PORTA CATH REMOVAL N/A 09/03/2020   Procedure: PORTA CATH REMOVAL;  Surgeon: Algernon Huxley, MD;  Location: [place] [facility];  Service: Cardiovascular;  Laterality: N/A;  . Removal of IVC filter   02/07/2019   chemo completed   . TOTAL HIP ARTHROPLASTY Right 10/08/2020   Social  History   Socioeconomic History  . Marital status: Married    Spouse name: Heath Lark   . Number of children: 1  . Years of education: Not on file  . Highest education level: Not on file  Occupational History  . Not on file  Tobacco Use  . Smoking status: Never Smoker  . Smokeless tobacco: Never Used  Vaping Use  . Vaping Use: Never used  Substance and Sexual Activity  . Alcohol use: No  . Drug use: No  . Sexual activity: Not Currently    Birth control/protection: Post-menopausal  Other Topics Concern  . Not on file  Social History Narrative  . Not on file   Social Determinants of Health   Financial Resource Strain:   . Difficulty of Paying Living Expenses: Not on file  Food Insecurity:   . Worried About Charity fundraiser in the Last Year: Not on file  . Ran Out of Food in the Last Year: Not on file  Transportation Needs:   . Lack of Transportation (Medical): Not on file  . Lack of Transportation (Non-Medical): Not on file  Physical Activity:   . Days of Exercise per Week: Not on file  . Minutes of Exercise per Session: Not on file  Stress:   . Feeling of Stress : Not on file  Social Connections:   . Frequency of Communication with Friends and Family: Not on file  . Frequency of Social Gatherings with Friends  and Family: Not on file  . Attends Religious Services: Not on file  . Active Member of Clubs or Organizations: Not on file  . Attends Archivist Meetings: Not on file  . Marital Status: Not on file  Intimate Partner Violence:   . Fear of Current or Ex-Partner: Not on file  . Emotionally Abused: Not on file  . Physically Abused: Not on file  . Sexually Abused: Not on file   Family Status  Relation Name Status  . Father  Alive  . Mother  Deceased at age 41       ovarian cancer  . Brother 80 Alive  . Mat Aunt 75 Alive  . MGM  Deceased  . MGF  Deceased  . PGM  Deceased  . PGF  Deceased  . Son 75 Alive  . Other  Deceased   Family History    Problem Relation Age of Onset  . Hypertension Father   . Ovarian cancer Mother 84       deceased 36  . Healthy Brother   . Skin cancer Paternal Grandmother        deceased 64s  . Cancer Paternal Grandfather        unk. primary; deceased 57s  . Ovarian cancer Other        mat grandmother's sister   No Known Allergies  Patient Care Team: Virginia Crews, MD as PCP - General (Family Medicine) Clent Jacks, RN as Registered Nurse   Medications: Outpatient Medications Prior to Visit  Medication Sig  . aspirin EC 81 MG tablet Take 81 mg by mouth daily. Swallow whole.  Marland Kitchen EPINEPHrine 0.3 mg/0.3 mL IJ SOAJ injection Inject 0.3 mg into the muscle as needed for anaphylaxis.  . [DISCONTINUED] predniSONE (DELTASONE) 5 MG tablet Taper down by 1 tablet by mouth daily starting at 6 day 1, then 5 day 2, 4 day 3, 3 day 4, 2 day 5 and 1 day 6. Divide dosage among meals and bedtime.   No facility-administered medications prior to visit.    Review of Systems  Constitutional: Positive for diaphoresis.  HENT: Negative.   Eyes: Negative.   Respiratory: Negative.   Cardiovascular: Negative.   Gastrointestinal: Negative.   Endocrine: Negative.   Genitourinary: Negative.   Musculoskeletal: Negative.   Skin: Negative.   Allergic/Immunologic: Negative.   Neurological: Positive for numbness.  Hematological: Negative.   Psychiatric/Behavioral: Negative.     Last CBC Lab Results  Component Value Date   WBC 4.6 06/04/2020   HGB 12.6 06/04/2020   HCT 37.8 06/04/2020   MCV 88.1 06/04/2020   MCH 29.4 06/04/2020   RDW 12.7 06/04/2020   PLT 226 09/60/4540   Last metabolic panel Lab Results  Component Value Date   GLUCOSE 105 (H) 06/04/2020   NA 142 06/04/2020   K 4.0 06/04/2020   CL 109 06/04/2020   CO2 26 06/04/2020   BUN 16 06/04/2020   CREATININE 0.73 06/04/2020   GFRNONAA >60 06/04/2020   GFRAA >60 06/04/2020   CALCIUM 8.9 06/04/2020   PROT 6.9 06/04/2020   ALBUMIN 4.0  06/04/2020   LABGLOB 2.7 07/17/2018   AGRATIO 1.4 07/17/2018   BILITOT 0.9 06/04/2020   ALKPHOS 71 06/04/2020   AST 16 06/04/2020   ALT 15 06/04/2020   ANIONGAP 7 06/04/2020   Last lipids Lab Results  Component Value Date   CHOL 219 (H) 10/07/2019   HDL 67 10/07/2019   LDLCALC 113 (H) 10/07/2019   TRIG  225 (H) 10/07/2019   CHOLHDL 3.3 10/07/2019   Last hemoglobin A1c No results found for: HGBA1C Last thyroid functions Lab Results  Component Value Date   TSH 1.140 10/07/2019   Last vitamin D No results found for: 25OHVITD2, 25OHVITD3, VD25OH Last vitamin B12 and Folate No results found for: VITAMINB12, FOLATE    Objective    BP 128/90 (BP Location: Left Arm, Patient Position: Sitting, Cuff Size: Normal)   Pulse 80   Temp 98.5 F (36.9 C) (Oral)   Resp 16   Ht 5' 8.5" (1.74 m)   Wt 237 lb 8 oz (107.7 kg)   LMP 08/13/2018 Comment: patient continues to bleed  SpO2 96%   BMI 35.59 kg/m  BP Readings from Last 3 Encounters:  11/10/20 128/90  11/05/20 127/84  09/03/20 96/71   Wt Readings from Last 3 Encounters:  11/10/20 237 lb 8 oz (107.7 kg)  11/05/20 243 lb (110.2 kg)  09/03/20 234 lb 5.6 oz (106.3 kg)      Physical Exam Vitals reviewed.  Constitutional:      General: She is not in acute distress.    Appearance: Normal appearance. She is well-developed. She is not diaphoretic.  HENT:     Head: Normocephalic and atraumatic.     Right Ear: Tympanic membrane, ear canal and external ear normal.     Left Ear: Tympanic membrane, ear canal and external ear normal.     Nose: Nose normal.     Mouth/Throat:     Mouth: Mucous membranes are moist.     Pharynx: Oropharynx is clear. No oropharyngeal exudate.  Eyes:     General: No scleral icterus.    Conjunctiva/sclera: Conjunctivae normal.     Pupils: Pupils are equal, round, and reactive to light.  Neck:     Thyroid: No thyromegaly.  Cardiovascular:     Rate and Rhythm: Normal rate and regular rhythm.      Pulses: Normal pulses.     Heart sounds: Normal heart sounds. No murmur heard.   Pulmonary:     Effort: Pulmonary effort is normal. No respiratory distress.     Breath sounds: Normal breath sounds. No wheezing or rales.  Abdominal:     General: There is no distension.     Palpations: Abdomen is soft.     Tenderness: There is no abdominal tenderness.  Musculoskeletal:        General: No deformity.     Cervical back: Neck supple.     Right lower leg: No edema.     Left lower leg: No edema.  Lymphadenopathy:     Cervical: No cervical adenopathy.  Skin:    General: Skin is warm and dry.     Findings: No rash.  Neurological:     Mental Status: She is alert and oriented to person, place, and time. Mental status is at baseline.     Sensory: No sensory deficit.     Motor: No weakness.  Psychiatric:        Mood and Affect: Mood normal.        Behavior: Behavior normal.        Thought Content: Thought content normal.      Last depression screening scores PHQ 2/9 Scores 11/10/2020 10/07/2019 04/18/2019  PHQ - 2 Score 0 0 0  PHQ- 9 Score 2 3 -   Last fall risk screening Fall Risk  11/10/2020  Falls in the past year? 0  Number falls in past yr: 0  Injury  with Fall? 0  Risk for fall due to : No Fall Risks  Follow up Falls evaluation completed   Last Audit-C alcohol use screening Alcohol Use Disorder Test (AUDIT) 11/10/2020  1. How often do you have a drink containing alcohol? 0  2. How many drinks containing alcohol do you have on a typical day when you are drinking? 0  3. How often do you have six or more drinks on one occasion? 0  AUDIT-C Score 0  Alcohol Brief Interventions/Follow-up AUDIT Score <7 follow-up not indicated   A score of 3 or more in women, and 4 or more in men indicates increased risk for alcohol abuse, EXCEPT if all of the points are from question 1   No results found for any visits on 11/10/20.  Assessment & Plan    Routine Health Maintenance and Physical  Exam  Exercise Activities and Dietary recommendations Goals    . Exercise 150 minutes per week (moderate activity)       Immunization History  Administered Date(s) Administered  . Influenza-Unspecified 10/08/2020  . PFIZER SARS-COV-2 Vaccination 03/22/2020, 04/15/2020, 11/10/2020    Health Maintenance  Topic Date Due  . TETANUS/TDAP  Never done  . MAMMOGRAM  01/05/2022  . COLONOSCOPY  10/30/2029  . INFLUENZA VACCINE  Completed  . COVID-19 Vaccine  Completed  . Hepatitis C Screening  Completed  . HIV Screening  Completed    Discussed health benefits of physical activity, and encouraged her to engage in regular exercise appropriate for her age and condition.  Problem List Items Addressed This Visit      Other   Class 2 obesity without serious comorbidity with body mass index (BMI) of 35.0 to 35.9 in adult    Discussed importance of healthy weight management Discussed diet and exercise       Relevant Orders   Lipid panel    Other Visit Diagnoses    Encounter for annual physical exam    -  Primary   Relevant Orders   MM 3D SCREEN BREAST BILATERAL   Lipid panel   Screening mammogram for breast cancer       Relevant Orders   MM 3D SCREEN BREAST BILATERAL   Encounter for immunization       Relevant Orders   Pfizer SARS-COV-2 Vaccine (Completed)       Return in about 1 year (around 11/10/2021) for CPE.     I, Lavon Paganini, MD, have reviewed all documentation for this visit. The documentation on 11/10/20 for the exam, diagnosis, procedures, and orders are all accurate and complete.   Jionni Helming, Dionne Bucy, MD, MPH [facility]

## 2020-11-10 NOTE — Assessment & Plan Note (Signed)
Discussed importance of healthy weight management Discussed diet and exercise  

## 2020-12-08 ENCOUNTER — Inpatient Hospital Stay: Payer: 59 | Attending: Oncology | Admitting: Oncology

## 2020-12-08 ENCOUNTER — Inpatient Hospital Stay: Payer: 59

## 2020-12-08 ENCOUNTER — Other Ambulatory Visit: Payer: Self-pay

## 2020-12-08 ENCOUNTER — Encounter: Payer: Self-pay | Admitting: Oncology

## 2020-12-08 VITALS — BP 122/91 | HR 76 | Temp 96.6°F | Resp 16 | Wt 240.6 lb

## 2020-12-08 DIAGNOSIS — Z90710 Acquired absence of both cervix and uterus: Secondary | ICD-10-CM | POA: Diagnosis not present

## 2020-12-08 DIAGNOSIS — Z7982 Long term (current) use of aspirin: Secondary | ICD-10-CM | POA: Diagnosis not present

## 2020-12-08 DIAGNOSIS — T451X5A Adverse effect of antineoplastic and immunosuppressive drugs, initial encounter: Secondary | ICD-10-CM | POA: Insufficient documentation

## 2020-12-08 DIAGNOSIS — Z86711 Personal history of pulmonary embolism: Secondary | ICD-10-CM | POA: Insufficient documentation

## 2020-12-08 DIAGNOSIS — C541 Malignant neoplasm of endometrium: Secondary | ICD-10-CM | POA: Diagnosis present

## 2020-12-08 DIAGNOSIS — Z79899 Other long term (current) drug therapy: Secondary | ICD-10-CM | POA: Insufficient documentation

## 2020-12-08 DIAGNOSIS — I2699 Other pulmonary embolism without acute cor pulmonale: Secondary | ICD-10-CM | POA: Diagnosis not present

## 2020-12-08 DIAGNOSIS — G620 Drug-induced polyneuropathy: Secondary | ICD-10-CM | POA: Insufficient documentation

## 2020-12-08 DIAGNOSIS — Z86718 Personal history of other venous thrombosis and embolism: Secondary | ICD-10-CM | POA: Insufficient documentation

## 2020-12-08 DIAGNOSIS — I87009 Postthrombotic syndrome without complications of unspecified extremity: Secondary | ICD-10-CM

## 2020-12-08 LAB — COMPREHENSIVE METABOLIC PANEL
ALT: 15 U/L (ref 0–44)
AST: 18 U/L (ref 15–41)
Albumin: 4 g/dL (ref 3.5–5.0)
Alkaline Phosphatase: 70 U/L (ref 38–126)
Anion gap: 15 (ref 5–15)
BUN: 11 mg/dL (ref 6–20)
CO2: 21 mmol/L — ABNORMAL LOW (ref 22–32)
Calcium: 9.1 mg/dL (ref 8.9–10.3)
Chloride: 103 mmol/L (ref 98–111)
Creatinine, Ser: 0.75 mg/dL (ref 0.44–1.00)
GFR, Estimated: >60 mL/min (ref >60)
Glucose, Bld: 97 mg/dL (ref 70–99)
Potassium: 3.8 mmol/L (ref 3.5–5.1)
Sodium: 139 mmol/L (ref 135–145)
Total Bilirubin: 0.8 mg/dL (ref 0.3–1.2)
Total Protein: 7 g/dL (ref 6.5–8.1)

## 2020-12-08 LAB — CBC WITH DIFFERENTIAL/PLATELET
Abs Immature Granulocytes: 0.01 10*3/uL (ref 0.00–0.07)
Basophils Absolute: 0.1 10*3/uL (ref 0.0–0.1)
Basophils Relative: 1 %
Eosinophils Absolute: 0.3 10*3/uL (ref 0.0–0.5)
Eosinophils Relative: 6 %
HCT: 38.9 % (ref 36.0–46.0)
Hemoglobin: 12.8 g/dL (ref 12.0–15.0)
Immature Granulocytes: 0 %
Lymphocytes Relative: 34 %
Lymphs Abs: 1.7 10*3/uL (ref 0.7–4.0)
MCH: 28.6 pg (ref 26.0–34.0)
MCHC: 32.9 g/dL (ref 30.0–36.0)
MCV: 87 fL (ref 80.0–100.0)
Monocytes Absolute: 0.3 10*3/uL (ref 0.1–1.0)
Monocytes Relative: 6 %
Neutro Abs: 2.7 10*3/uL (ref 1.7–7.7)
Neutrophils Relative %: 53 %
Platelets: 259 10*3/uL (ref 150–400)
RBC: 4.47 MIL/uL (ref 3.87–5.11)
RDW: 12.4 % (ref 11.5–15.5)
WBC: 5 10*3/uL (ref 4.0–10.5)
nRBC: 0 % (ref 0.0–0.2)

## 2020-12-08 LAB — LIPID PANEL
Cholesterol: 209 mg/dL — ABNORMAL HIGH (ref 0–200)
HDL: 67 mg/dL (ref >40)
LDL Cholesterol: 119 mg/dL — ABNORMAL HIGH (ref 0–99)
Total CHOL/HDL Ratio: 3.1 RATIO
Triglycerides: 115 mg/dL (ref <150)
VLDL: 23 mg/dL (ref 0–40)

## 2020-12-08 NOTE — Progress Notes (Signed)
Hematology/Oncology follow up  note [facility] [phone] Fax:([phone]   Patient Care Team: Virginia Crews, MD as PCP - General (Family Medicine) Clent Jacks, RN as Registered Nurse  REFERRING PROVIDER: [facility] VISIT Follow up for management of  endometrial cancer.   HISTORY OF PRESENTING ILLNESS:  Karen Mann is a  59 y.o.  female with PMH listed below who was referred to me for evaluation of newly diagnosed endometrial cancer. Patient was recently referred to GYN Dr. Gilman Schmidt for abnormal uterine bleeding. 07/23/2018 ultrasound was performed showed large pelvic mass 20 x 13 x 22 cm with cystic and solid components.  Ultrasound also showed 4.9 cm fibroid and thickened endometrium 15 mm thickness 08/05/2018 MRI pelvis with and without contrast 20.6 cm complex cystic solid mass in the pelvis with numerous thickened enhancing septations, compatible with malignant ovarian neoplasm.  Neither ovary was discretely visualized.  Small volume of ascites.  Cross paratonia disease is not visualized but not excluded.  Left hydro-ureteronephrosis secondary to extrinsic compression. 5.3 intramural anterior uterine body fibroid.  #Endometrial biopsy was performed.  Pathology showed endometrioid adenocarcinoma consistent with FIGO grade 3.  8/12/19CA125 was elevated at 622 Patient was referred to see Dr. Fransisca Connors.  She was evaluated by Dr. Meredith Mody on 08/08/2018.  Staging CT was obtained. #Images independently reviewed by me.   Staging CT showed large cystic and a solid mass likely arising from the ovaries, measures 369 0 cc, suspicious for ovarian malignancy.  Moderate ascites noted.  Raising concern for malignant peritoneal spread.  No well-defined omental caking or solid tumor deposits along a situs., -Large right pleural effusion over half of right hemithorax volume. -Liver hypodensity 2.8 x 2.8 cm -Moderate left and mild right  hydronephrosis attributed to the ureteral compression due to mass. -Other chronic image findings.  MRI pelvis with and without contrast again showed 20.6 cm complex cystic/solid mass in the pelvis.  Compatible with malignant ovarian neoplasm.  Small volume of a situs.  Cross peritoneal disease is not visualized but cannot be excluded.  Left hydroureteronephrosis secondary to compression.  # Dr. Fransisca Connors recommends systemic therapy with carboplatin and paclitaxel 3 cycles, reassessment for surgical evaluation.per Dr.Berchuck, not candidate for Pembrolizumab and Levetinib trial.   # 08/10/2018 thoracentesis  with 1.2 L fluid removed.  Cytology negative for malignancy. 08/16/2018 status post thoracentesis again and removed 2.2 L of mildly bloody fluid.  I discussed with Dr. Dicie Beam and she reviewed the slides there was no malignancy cells. Pleural fluid LDH 246, albumin 2.5, protein 4.2 Serum LDH 270.  Protein level 6.4 08/22/2018, another thoracentesis both liters of fluid.  Cytology negative.  Patient's case was discussed on GYN tumor conference.  Dr. Theora Gianotti recommends NexGen sequence molecular testing.  And also check for PDL 1- TPS 0%  # CT PE study showed bilateral pulmonary embolism with right heart strain..  Lower extremity venous Doppler showed bilateral DVT, started on anticoagulation.  10/29/2018 patient underwent debulking procedure by Dr. Fransisca Connors. Pathology showed A. Mesenteric nodule, excision:Benign inclusion with transitional type epithelium.Negative for malignancy.  B. Right pelvic peritoneum, excision:Mesothelial-lined fibromuscular and adipose tissue.Negative for malignancy.  C. Appendices epiploica, excision:Metastatic adenocarcinoma D. Omentum, omentectomy: Omentum.Negative for malignancy.  E. Uterus, bilateral ovaries and fallopian tubes, hysterectomy and bilateral salpingo-oophorectomy:  Endometrioid adenocarcinoma of the endometrium. See note FIGO grade: 3 of 3. Tumor size:  3.5 cm Depth of invasion: 3 mm in a 37 mm thick myometrium Cervical involvement: Not identified  Adnexal  involvement: Metastatic carcinoma involves bilateral ovaries. Lymphovascular invasion: Not identified   Microsatellite instability testing has been ordered on block E5. Note: The residual carcinoma in the uterus is present in a background of abundant macrophages and lymphocytes, consistent with a partial response to therapy. No significant response to therapy is identified in the tumor in the ovaries. The tumor grade is based on the combination of architectural and cytologic features.  SPECIMEN   Procedure:  Total hysterectomy and bilateral salpingo-oophorectomy    Procedure:  Omentectomy    Procedure:  Peritoneal biopsies    Specimen Integrity:  Received disrupted  TUMOR   Tumor Site:  Fundus    Histologic Type:  Endometrioid carcinoma, NOS    Histologic Grade:  FIGO grade 3    Tumor Size:  Greatest dimension in Centimeters (cm): 3.5 Centimeters (cm)   Tumor Extent:      Myometrial Invasion:  Present      Depth of Invasion in Millimeters (mm):  3 Millimeters (mm)     Myometrial Thickness in Millimeters (mm):  37 Millimeters (mm)     Percentage of Myometrial Invasion:  8 %    Adenomyosis:  Present, involved by carcinoma     Uterine Serosa Involvement:  Not identified     Lower Uterine Segment Involvement:  Not identified     Cervical Stromal Involvement:  Not identified     Other Tissue / Organ Involvement:  Right ovary     Other Tissue / Organ Involvement:  Left ovary     Other Tissue / Organ Involvement:  Appendices epiploica    Accessory Findings:      Lymphovascular Invasion:  Not identified  Margins LYMPH NODES   Regional Lymph Nodes:  No lymph nodes submitted or found  PATHOLOGIC STAGE CLASSIFICATION (pTNM, AJCC 8th Edition)   Primary Tumor (pT):  pT3a    Regional Lymph Nodes (pN):       Category (pN):  pNX    Distant Metastasis (pM):  pM1     Site(s):  Appendices epiploica  ADDITIONAL FINDINGS   Additional Pathologic Findings:  Uterine leiomyomata    #  Family history ovarian cancer and personal history of endometrial CA.Patient also has had genetic testing done which showed 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  She has discussed findings with Dietitian.  She does not have features of NF.  Not meeting criteria for diagnosis of NF1.  Patient's case was discussed on [place] oncology tumor board.  Consensus was to continue with additional 3 cycles of carboplatin and paclitaxel.  Follow CA125 for response.  Repeat imaging after finishing chemotherapy.  Can consider radiation at that time.  # IVC filter was removed on 02/07/2019.  #08/15/2018-09/26/2018 3 cycles neoadjuvant carboplatin and Taxol #11/21/2018 - 01/02/2019, 3 cycles of adjuvant carboplatin and Taxol #03/11/2019 finished adjuvant radiation.  INTERVAL HISTORY Karen Mann is a 59 y.o. female with oncology history reviewed by me today presents for 30-monthfollow-up. Patient was seen by Dr. BFransisca ConnorsGYN oncology 3 months ago.  CA-125 has been stable and within normal limits. Patient is on aspirin 81 mg p.o. She also had Mediport discontinued in September 2021. Also had right hip surgery done during interval.  She was supposed to take Xarelto for 2 weeks for postop anticoagulation prophylaxis and due to the delayed insurance authorization, she took aspirin 162 mg daily for 2 weeks.  Not experienced any right lower extremity swelling erythema  or pain.   chemotherapy-induced lower extremity neuropathy.  She has tried acupuncture which did not help.  Symptoms are slightly better . Review of Systems  Constitutional:  Negative for chills, fever, malaise/fatigue and weight loss.  HENT: Negative for sore throat.   Eyes: Negative for redness.  Respiratory: Negative for cough, shortness of breath and wheezing.   Cardiovascular: Negative for chest pain, palpitations and leg swelling.  Gastrointestinal: Negative for abdominal pain, blood in stool, nausea and vomiting.  Genitourinary: Negative for dysuria.  Musculoskeletal: Negative for myalgias.  Skin: Negative for rash.  Neurological: Positive for tingling. Negative for dizziness and tremors.  Endo/Heme/Allergies: Does not bruise/bleed easily.  Psychiatric/Behavioral: Negative for hallucinations.    MEDICAL HISTORY:  Past Medical History:  Diagnosis Date  . Allergy   . Anemia 07/20/2018  . Endometrial adenocarcinoma ([place]) 07/2018   with chemo and rad tx  . Fibroid uterus 07/24/2018   5cm/2 inch   . Ovarian mass 07/24/2018   22cm/10 inch  . Pelvic mass   . Personal history of chemotherapy 2019  . Personal history of radiation therapy 2020    SURGICAL HISTORY: Past Surgical History:  Procedure Laterality Date  . ABDOMINAL HYSTERECTOMY  10/2018  . COLONOSCOPY WITH PROPOFOL N/A 10/31/2019   Procedure: COLONOSCOPY WITH PROPOFOL;  Surgeon: Virgel Manifold, MD;  Location: [facility] ENDOSCOPY;  Service: Endoscopy;  Laterality: N/A;  . EYE SURGERY  2000   Lasik  . IVC FILTER REMOVAL Right 02/07/2019   Procedure: IVC FILTER REMOVAL;  Surgeon: Algernon Huxley, MD;  Location: [facility];  Service: Cardiovascular;  Laterality: Right;  . PORTA CATH INSERTION N/A 08/13/2018   Procedure: PORTA CATH INSERTION;  Surgeon: Algernon Huxley, MD;  Location: [facility];  Service: Cardiovascular;  Laterality: N/A;  . PORTA CATH REMOVAL N/A 09/03/2020   Procedure: PORTA CATH REMOVAL;  Surgeon: Algernon Huxley, MD;  Location: [facility];  Service: Cardiovascular;  Laterality: N/A;  . Removal of IVC filter   02/07/2019   chemo completed   . TOTAL HIP  ARTHROPLASTY Right 10/08/2020    SOCIAL HISTORY: Social History   Socioeconomic History  . Marital status: Married    Spouse name: Heath Lark   . Number of children: 1  . Years of education: Not on file  . Highest education level: Not on file  Occupational History  . Not on file  Tobacco Use  . Smoking status: Never Smoker  . Smokeless tobacco: Never Used  Vaping Use  . Vaping Use: Never used  Substance and Sexual Activity  . Alcohol use: No  . Drug use: No  . Sexual activity: Not Currently    Birth control/protection: Post-menopausal  Other Topics Concern  . Not on file  Social History Narrative  . Not on file   Social Determinants of Health   Financial Resource Strain: Not on file  Food Insecurity: Not on file  Transportation Needs: Not on file  Physical Activity: Not on file  Stress: Not on file  Social Connections: Not on file  Intimate Partner Violence: Not on file    FAMILY HISTORY: Family History  Problem Relation Age of Onset  . Hypertension Father   . Ovarian cancer Mother 52       deceased 51  . Healthy Brother   . Skin cancer Paternal Grandmother        deceased 87s  . Cancer Paternal Grandfather        unk. primary; deceased  25s  . Ovarian cancer Other        mat grandmother's sister    ALLERGIES:  has No Known Allergies.  MEDICATIONS:  Current Outpatient Medications  Medication Sig Dispense Refill  . aspirin EC 81 MG tablet Take 81 mg by mouth daily. Swallow whole.    . cetirizine (ZYRTEC) 10 MG tablet Take 10 mg by mouth daily.    Marland Kitchen EPINEPHrine 0.3 mg/0.3 mL IJ SOAJ injection Inject 0.3 mg into the muscle as needed for anaphylaxis. 2 each 0   No current facility-administered medications for this visit.     PHYSICAL EXAMINATION: ECOG PERFORMANCE STATUS: 0 - Asymptomatic Vitals:   12/08/20 1020  BP: (!) 122/91  Pulse: 76  Resp: 16  Temp: (!) 96.6 F (35.9 C)   Filed Weights   12/08/20 1020  Weight: 240 lb 9.6 oz (109.1 kg)     Physical Exam Constitutional:      General: She is not in acute distress. HENT:     Head: Normocephalic and atraumatic.  Eyes:     General: No scleral icterus.    Pupils: Pupils are equal, round, and reactive to light.  Cardiovascular:     Rate and Rhythm: Normal rate and regular rhythm.     Heart sounds: Normal heart sounds.  Pulmonary:     Effort: Pulmonary effort is normal. No respiratory distress.     Breath sounds: Normal breath sounds. No wheezing.  Abdominal:     General: Bowel sounds are normal. There is no distension.     Palpations: Abdomen is soft. There is no mass.     Tenderness: There is no abdominal tenderness.  Musculoskeletal:        General: No swelling or deformity. Normal range of motion.     Cervical back: Normal range of motion and neck supple.  Skin:    General: Skin is warm and dry.     Findings: No erythema or rash.  Neurological:     Mental Status: She is alert and oriented to person, place, and time. Mental status is at baseline.     Cranial Nerves: No cranial nerve deficit.     Coordination: Coordination normal.  Psychiatric:        Mood and Affect: Mood normal.        Thought Content: Thought content normal.        LABORATORY DATA:  I have reviewed the data as listed Lab Results  Component Value Date   WBC 5.0 12/08/2020   HGB 12.8 12/08/2020   HCT 38.9 12/08/2020   MCV 87.0 12/08/2020   PLT 259 12/08/2020   Recent Labs    01/20/20 1050 06/04/20 1011 12/08/20 0958  NA 139 142 139  K 3.8 4.0 3.8  CL 106 109 103  CO2 25 26 21*  GLUCOSE 98 105* 97  BUN 15 16 11   CREATININE 0.91 0.73 0.75  CALCIUM 9.3 8.9 9.1  GFRNONAA >60 >60 >60  GFRAA >60 >60  --   PROT 7.1 6.9 7.0  ALBUMIN 4.3 4.0 4.0  AST 16 16 18   ALT 16 15 15   ALKPHOS 67 71 70  BILITOT 0.9 0.9 0.8   Iron/TIBC/Ferritin/ %Sat    Component Value Date/Time   IRON 25 (L) 09/05/2018 0819   IRON 11 (L) 07/17/2018 1124   TIBC 245 (L) 09/05/2018 0819   TIBC 312  07/17/2018 1124   FERRITIN 323 (H) 09/05/2018 0819   FERRITIN 60 07/17/2018 1124   IRONPCTSAT 10 (L)  09/05/2018 0819   IRONPCTSAT 4 (LL) 07/17/2018 1124    RADIOGRAPHIC STUDIES: I have personally reviewed the radiological images as listed and agreed with the findings in the report. 08/24/1999 nineteen 2D echo showed LVEF 55 to 60%. 08/30/2018 CXR Interval reduction/resolution of right-sided pleural effusion post thoracentesis. No pneumothorax.  09/23/2018, [place] lower extremity venous bilaterally positive for bilateral DVT CT chest PE protocol showed bilateral pulmonary emboli, most centrally in the bilateral main pulmonary arteries.  There are associated with signs suggestive of right heart strain.  No evidence of pulmonary infarct.  Moderate right pleural effusion.  Partially visualized left hydronephrosis also present on prior CT.   ASSESSMENT & PLAN:  1. Endometrial adenocarcinoma ([place])   2. History of deep vein thrombosis (DVT) of lower extremity   3. Bilateral pulmonary embolism ([place])   4. Neuropathy due to chemotherapeutic drug ([place])   5. Post-thrombotic syndrome     # FIGO grade 3 endometrial adenocarcinoma, pT3a Nx pM1 11/21/2018, post surgery CA125 58.3 08/26/2020, CA-125 5.5.  Today CA125 is pending.  Continue follow-up with gynecology oncology for pelvic examination.   Clinically she is doing very well.   #Port-A-Cath in place, port has been discontinued.  #History of bilateral PE and bilateral lower extremity DVT.  Continue aspirin 81 mg.  Post thrombotic syndrome, mild lower extremity swelling.  Recommend leg elevation and compression stocking. Chemotherapy-induced neuropathy, symptoms are stable.Marland Kitchen  #Per request from primary care provider, lipid panel was checked today and the results were forwarded to [place].   Orders Placed This Encounter  Procedures  . Lipid panel    Standing Status:   Future    Number of Occurrences:   1    Standing Expiration Date:    12/08/2021    Return of visit: 6 months.    Earlie Server, MD, PhD Hematology Oncology [facility] Pager- [phone] 12/08/2020

## 2020-12-08 NOTE — Progress Notes (Signed)
Patient denies new problems/concerns today.   °

## 2020-12-09 LAB — CA 125: Cancer Antigen (CA) 125: 7.2 U/mL (ref 0.0–38.1)

## 2020-12-10 ENCOUNTER — Telehealth: Payer: Self-pay

## 2020-12-10 DIAGNOSIS — C541 Malignant neoplasm of endometrium: Secondary | ICD-10-CM

## 2020-12-10 NOTE — Telephone Encounter (Signed)
Patient notified via [place].  Please schedule.

## 2020-12-10 NOTE — Telephone Encounter (Signed)
-----   Message from Earlie Server, MD sent at 12/09/2020  9:16 PM EST ----- Please arrange patient to repeat CA125 after new year day. Thanks.

## 2020-12-10 NOTE — Telephone Encounter (Signed)
Done.. Pt has been sched to RTC to has Repeat labs drawn on 12/28/20 @ 11:15

## 2020-12-28 ENCOUNTER — Inpatient Hospital Stay: Payer: 59 | Attending: Oncology

## 2020-12-28 ENCOUNTER — Other Ambulatory Visit: Payer: Self-pay

## 2020-12-28 DIAGNOSIS — Z86718 Personal history of other venous thrombosis and embolism: Secondary | ICD-10-CM | POA: Insufficient documentation

## 2020-12-28 DIAGNOSIS — Z86711 Personal history of pulmonary embolism: Secondary | ICD-10-CM | POA: Insufficient documentation

## 2020-12-28 DIAGNOSIS — C541 Malignant neoplasm of endometrium: Secondary | ICD-10-CM | POA: Insufficient documentation

## 2020-12-28 DIAGNOSIS — Z7982 Long term (current) use of aspirin: Secondary | ICD-10-CM | POA: Diagnosis not present

## 2020-12-28 DIAGNOSIS — Z79899 Other long term (current) drug therapy: Secondary | ICD-10-CM | POA: Insufficient documentation

## 2020-12-29 LAB — CA 125: Cancer Antigen (CA) 125: 6.6 U/mL (ref 0.0–38.1)

## 2020-12-30 ENCOUNTER — Telehealth: Payer: Self-pay

## 2020-12-30 DIAGNOSIS — C541 Malignant neoplasm of endometrium: Secondary | ICD-10-CM

## 2020-12-30 NOTE — Telephone Encounter (Signed)
M[place] message sent to patient notifying her.   Please schedule labs in 3 months and notify pt of appt. Thanks.

## 2020-12-30 NOTE — Telephone Encounter (Signed)
-----   Message from Rickard Patience, MD sent at 12/29/2020 11:10 PM EST ----- Let her know that cancer marker has improved. Recommend her to repeat CA125 in 3 months, please arrange. Thanks.

## 2021-01-01 NOTE — Telephone Encounter (Signed)
Done..  Pt has been sched for labs in 34mths as requested. I was unable to reach her by phone. A NEW reminder letter will be mailed mailed out.

## 2021-02-24 ENCOUNTER — Ambulatory Visit
Admission: RE | Admit: 2021-02-24 | Discharge: 2021-02-24 | Disposition: A | Discharge status: Home / Self Care | Payer: 59 | Source: Ambulatory Visit | Attending: Obstetrics and Gynecology | Admitting: Obstetrics and Gynecology

## 2021-02-24 ENCOUNTER — Inpatient Hospital Stay (HOSPITAL_BASED_OUTPATIENT_CLINIC_OR_DEPARTMENT_OTHER): Payer: 59 | Admitting: Obstetrics and Gynecology

## 2021-02-24 ENCOUNTER — Inpatient Hospital Stay: Payer: Self-pay | Attending: Obstetrics and Gynecology

## 2021-02-24 ENCOUNTER — Other Ambulatory Visit: Payer: Self-pay

## 2021-02-24 VITALS — BP 123/83 | HR 70 | Temp 98.7°F | Resp 20 | Wt 238.0 lb

## 2021-02-24 DIAGNOSIS — C541 Malignant neoplasm of endometrium: Secondary | ICD-10-CM

## 2021-02-24 DIAGNOSIS — Z90722 Acquired absence of ovaries, bilateral: Secondary | ICD-10-CM | POA: Insufficient documentation

## 2021-02-24 DIAGNOSIS — Z9221 Personal history of antineoplastic chemotherapy: Secondary | ICD-10-CM | POA: Insufficient documentation

## 2021-02-24 DIAGNOSIS — Z6835 Body mass index (BMI) 35.0-35.9, adult: Secondary | ICD-10-CM | POA: Insufficient documentation

## 2021-02-24 DIAGNOSIS — Z86711 Personal history of pulmonary embolism: Secondary | ICD-10-CM | POA: Insufficient documentation

## 2021-02-24 DIAGNOSIS — E669 Obesity, unspecified: Secondary | ICD-10-CM | POA: Insufficient documentation

## 2021-02-24 DIAGNOSIS — Z90710 Acquired absence of both cervix and uterus: Secondary | ICD-10-CM | POA: Insufficient documentation

## 2021-02-24 DIAGNOSIS — Z923 Personal history of irradiation: Secondary | ICD-10-CM | POA: Insufficient documentation

## 2021-02-24 DIAGNOSIS — I872 Venous insufficiency (chronic) (peripheral): Secondary | ICD-10-CM | POA: Insufficient documentation

## 2021-02-24 DIAGNOSIS — Z86718 Personal history of other venous thrombosis and embolism: Secondary | ICD-10-CM | POA: Insufficient documentation

## 2021-02-24 DIAGNOSIS — I2729 Other secondary pulmonary hypertension: Secondary | ICD-10-CM | POA: Insufficient documentation

## 2021-02-24 DIAGNOSIS — Z96641 Presence of right artificial hip joint: Secondary | ICD-10-CM | POA: Insufficient documentation

## 2021-02-24 NOTE — Progress Notes (Signed)
Gynecologic Oncology Progress Note   Referring Provider:  Dr. Gilman Schmidt  Chief Complaint: Stage IIIA grade 3 endometrial adenocarcinoma  Subjective:  Karen Mann is a 59 y.o. with stage IIIA grade 3 endometrioid adenocarcinoma of the endometrium s/p 3 cycles neoadjuvant carbo-Taxol followed by ex-lap, TAH-BSO, omentectomy, and right ureterolysis, followed by 3 cycles of adjuvant carbo-taxol, pelvic ERT and vaginal brachytherapy.   She returns today for surveillance. Doing well overall with no GI, GU or Resp complaints. She had the R hip replacement last 09/2020 and is very happy with the results. The IV portacath was removed. She saw Dr. Tasia Catchings on 12/08/2020 and had a reassuring exam.  CA 125 has been followed and normal (< 10) since 1/20 12/28/2020 6.6   Oncology History She was seen in GYN oncology clinic and reported continual vaginal bleeding for 5-6 months.  She was found to be significantly anemic and started on iron pills.  She was also having significant dyspnea. Patient initially referred to Dr. Gilman Schmidt by PCP, Dr. Brita Romp, d/t large abdominal mass seen on pelvic ultrasound concerning for ovarian cancer. Patient first noticed bloating, fatigue, and constipation in July 2019. She presented to PCP. FSH 6.4, LH 5.8. 07/23/18- Ultrasound was performed which showed: pelvic mass measuring 20 x 13 x 22 cm with cystic and solid components. Ultrasound also showed: 4-5cm fibroid and thickened endometrium of 47m  08/05/18- MR PELVIS W WO CONTRAST - 20.6 cm (17.1 x 20.6 x 20.0 cm) complex cystic/solid mass in the pelvis w/ numerous thickened/enhancing septations, compatible with malignant ovarian neoplasm. Neither ovary is discretely visualized. - Small volume abdominopelvic ascites. Gross peritoneal disease is not visualized but cannot be excluded. - Left hydroureteronephrosis secondary to extrinsic compression. - 5.3 intramural anterior uterine body fibroid  On exam, Dr. SGilman Schmidtnoted bilateral  lower extremity swelling with mass extending close to sternum, ~ 30 cm. Endometrial biopsy was performed. Pathology: Endometrioid Adenocarcinoma consistent with FIGO grade 3  CA 125  08/06/2018 622.0  08/09/18- CT C/A/P-imaging showed a large cystic/solid mass measuring 28 x 11.6 x 21.4 cm suspicious for ovarian malignancy along with moderate ascites, large right pleural effusion, and moderate left and mild right hydronephrosis attributed to ureteral compression due to mass.   08/15/2018-initiated carboplatin AUC 6 and paclitaxel 175 mg/m.    CA 125- 692  09/05/2018-cycle 2 carboplatin AUC 6 and paclitaxel 175 mg/m.  [id number]  Mild hypersensitivity to paclitaxel with chest tightness. Taxol. Solu-Medrol and Benadryl given along with fluid bolus.  Challenged and patient was able to tolerate treatment at slower rate.  09/21/2018-planned for hysterectomy, however, patient diagnosed with PE/DVT.  CT chest PE protocol- 1.  Bilateral pulmonary emboli, most centrally in the bilateral main pulmonary arteries.  No evidence of right heart strain or pulmonary infarct. 2.  Moderate right pleural effusion 3.  Partially visualized left hydronephrosis  UKoreabilateral lower extremities revealed 1.  Deep vein thrombus in the right popliteal vein, thrombus extends into the right tibioperoneal trunk and proximal and mid portions of the right peroneal veins. 2.  Deep vein thrombus in the left common femoral vein and in the left popliteal vein. 3.  Occlusive thrombus in seen on in the left greater saphenous vein, superficial vein.  Echo revealed LVEF 55%.   09/26/2018-underwent placement of IVC filter in IR.  She was started on therapeutic Lovenox.   She proceeded with cycle 3 carboplatin AUC 6 and paclitaxel 175 mg/m with Neulasta support on 09/26/2018.   Ca 125- 521  10/19/2018-CT C/A/P  1. Redemonstrated large cystic and solid mass measuring similarly in size. 2. Improved left hydronephrosis. 3.  Improved ascites  with redemonstration of a moderate right pleural effusion.  Redemonstrated large cystic solid pelvic mass, dorsal to the uterus and inseparable from the ovaries, measuring approximately 22.8 x 21.9 x 12.3 cm previously measuring 21.6 x 21.2 x 14.8 cm.  Slightly improved ascites.  She underwent palliative thoracentesis on 10/26/2018, 10/11/18, 10/02/18, 09/19/18, 09/10/18, 08/30/18, 08/22/18, 08/16/18, 08/10/18. Pleural fluid was suspicious for malignancy on 10/02/18 with rare atypical PAX-8 and MOC-31 positive cells present.   10/29/18- patient underwent exploratory laparotomy, total abdominal hysterectomy, bilateral salpingo-oophorectomy, omentectomy, and right ureterolysis at [facility]. EBL 500 ml and she received 2 units of PRBCs during her admission postoperatively. Removal of uterus and very large right ovarian metastasis that was adherent to the pelvic structures. No evidence of carcinomatosis. Parts of the mass that were adherent to the pelvic peritoneum after it was removed were also resected and had cancer.    Pathology-  DIAGNOSIS A. Mesenteric nodule, excision:  - Benign inclusion with transitional type epithelium. Negative for malignancy.    B. Right pelvic peritoneum, excision: - Mesothelial-lined fibromuscular and adipose tissue. Negative for malignancy.    C. Appendices epiploica, excision: - Metastatic adenocarcinoma.    D. Omentum, omentectomy:  - Omentum. Negative for malignancy.    E. Uterus, bilateral ovaries and fallopian tubes, hysterectomy and bilateral salpingo-oophorectomy:  - Endometrioid adenocarcinoma of the endometrium. See note FIGO grade: 3 of 3. Tumor size: 3.5 cm Depth of invasion: 3 mm in a 37 mm thick myometrium Cervical involvement: Not identified  Adnexal involvement: Metastatic carcinoma involves bilateral ovaries. Lymphovascular invasion: Not identified   MSI-stable  -Abdominal fluid-negative.  No evidence of malignancy.   She completed 3 additional cycles of  adjuvant chemotherapy with carbo-Taxol on 11/21/2018-01/02/2019.  01/21/2019-CT C/A/P 1. Interval resection of dominant abdominopelvic mass with resolution of abdominal ascites 2. Hysterectomy with asymmetric soft tissue at the left superior portion of the vaginal cuff. Cannot exclude residual or recurrent disease in this area. This could either be re-evaluated at CT follow-up of 3 months or more entirely characterized with PET 3. Otherwise, no evidence of metastatic disease in the chest, abdomen, or pelvis 4. Cholelithiasis 5.  Possible constipation  CA 125 58.3 11/21/2018 6.0 01/02/2019  She initiated pelvic radiation with Dr. Baruch Gouty 01/22/2019 because fragments of ovarian mass that were peeled off the pelvic peritoneum after main mass removed had cancer. Finished radiation in 2/20 including pelvic ERT and vaginal bracytherapy.   02/07/19 IVC filter removed and she is takes Eliquis.  4/20 and 9/20  Follow up CT scans in showed stability of vaginal cuff and pelvic exams normal.    Genetic Testing-  11/19 Germline genetic testing with [facility] was negative for mutations in BRCA1/2 and other HR pathway genes.  Possible mosaic variant in NF1 (not clinically significant per genetic counselor) and VUS in POLE and RECQL4.  Tumor Testing- Microsatellite stable, TMB low (3) -Foundation One PD-L1 - TPS 0% PTEN, TP53, PIK3CA mutations  Family History: She reports her mother died of ovarian cancer at age 83 (diagnosed at age 46).  Screening She has had a colonoscopy on 10/31/2019 which showed diverticulosis, otherwise normal, and recommendation to repeat in 10 years around 10/2029.  Mammogram 01/06/2020 reported as bi-rads category 1: negative.    Family History  Problem Relation Age of Onset  . Hypertension Father   . Ovarian cancer Mother 65  deceased 74  . Healthy Brother   . Skin cancer Paternal Grandmother        deceased 36s  . Cancer Paternal Grandfather        unk. primary;  deceased 53s  . Ovarian cancer Other        mat grandmother's sister    Problem List: Patient Active Problem List   Diagnosis Date Noted  . Class 2 obesity without serious comorbidity with body mass index (BMI) of 35.0 to 35.9 in adult 11/10/2020  . Chronic anticoagulation 01/02/2019  . Encounter for antineoplastic chemotherapy 01/02/2019  . Neuropathy due to chemotherapeutic drug ([place]) 01/02/2019  . S/P insertion of IVC (inferior vena caval) filter 09/26/2018  . Bilateral pulmonary embolism ([place]) 09/22/2018  . DVT (deep venous thrombosis) ([place]) 09/22/2018  . Anxiety associated with cancer diagnosis ([place]) 09/07/2018  . Recurrent pleural effusion on right 09/07/2018  . Chemotherapy-induced peripheral neuropathy ([place]) 09/07/2018  . Iron deficiency anemia due to chronic blood loss 08/10/2018  . Goals of care, counseling/discussion 08/10/2018  . Endometrial adenocarcinoma ([place]) 08/10/2018  . Arthritis 12/01/2015  . Secondary pulmonary hypertension 11/24/2015  . Allergic rhinitis 03/02/2010  . Varicose veins of lower extremities with inflammation 01/06/2010  . Chronic venous insufficiency 01/06/2010  . Nonrheumatic tricuspid valve disorder 12/30/2009  . Fibroid 12/09/2009  . Family history of malignant neoplasm of ovary 12/02/2009    Past Medical History: Past Medical History:  Diagnosis Date  . Allergy   . Anemia 07/20/2018  . Endometrial adenocarcinoma ([place]) 07/2018   with chemo and rad tx  . Fibroid uterus 07/24/2018   5cm/2 inch   . Ovarian mass 07/24/2018   22cm/10 inch  . Pelvic mass   . Personal history of chemotherapy 2019  . Personal history of radiation therapy 2020    Past Surgical History: Past Surgical History:  Procedure Laterality Date  . ABDOMINAL HYSTERECTOMY  10/2018  . COLONOSCOPY WITH PROPOFOL N/A 10/31/2019   Procedure: COLONOSCOPY WITH PROPOFOL;  Surgeon: Virgel Manifold, MD;  Location: [facility] ENDOSCOPY;  Service: Endoscopy;  Laterality: N/A;  .  EYE SURGERY  2000   Lasik  . IVC FILTER REMOVAL Right 02/07/2019   Procedure: IVC FILTER REMOVAL;  Surgeon: Algernon Huxley, MD;  Location: [place] [facility];  Service: Cardiovascular;  Laterality: Right;  . PORTA CATH INSERTION N/A 08/13/2018   Procedure: PORTA CATH INSERTION;  Surgeon: Algernon Huxley, MD;  Location: [place] [facility];  Service: Cardiovascular;  Laterality: N/A;  . PORTA CATH REMOVAL N/A 09/03/2020   Procedure: PORTA CATH REMOVAL;  Surgeon: Algernon Huxley, MD;  Location: [place] [facility];  Service: Cardiovascular;  Laterality: N/A;  . Removal of IVC filter   02/07/2019   chemo completed   . TOTAL HIP ARTHROPLASTY Right 10/08/2020    Past Gynecologic History:  G1P1 Age of Menarche: 61 Regular menstrual periods Denies abnormal pap smears Denies history of STDs  OB History:  OB History  Gravida Para Term Preterm AB Living  1 1 1     1   SAB IAB Ectopic Multiple Live Births          1    # Outcome Date GA Lbr Len/2nd Weight Sex Delivery Anes PTL Lv  1 Term 09/03/85 [redacted]w[redacted]d 9 lb 2.5 oz (4.153 kg) M Vag-Spont   LIV    Family History: Family History  Problem Relation Age of Onset  . Hypertension Father   . Ovarian cancer Mother 568  deceased 35  . Healthy Brother   . Skin cancer Paternal Grandmother        deceased 58s  . Cancer Paternal Grandfather        unk. primary; deceased 68s  . Ovarian cancer Other        mat grandmother's sister    Social History: Social History   Socioeconomic History  . Marital status: Married    Spouse name: Heath Lark   . Number of children: 1  . Years of education: Not on file  . Highest education level: Not on file  Occupational History  . Not on file  Tobacco Use  . Smoking status: Never Smoker  . Smokeless tobacco: Never Used  Vaping Use  . Vaping Use: Never used  Substance and Sexual Activity  . Alcohol use: No  . Drug use: No  . Sexual activity: Not Currently    Birth control/protection: Post-menopausal   Other Topics Concern  . Not on file  Social History Narrative  . Not on file   Social Determinants of Health   Financial Resource Strain: Not on file  Food Insecurity: Not on file  Transportation Needs: Not on file  Physical Activity: Not on file  Stress: Not on file  Social Connections: Not on file  Intimate Partner Violence: Not on file    Allergies: No Known Allergies  Current Medications: Current Outpatient Medications  Medication Sig Dispense Refill  . aspirin EC 81 MG tablet Take 81 mg by mouth daily. Swallow whole.    . cetirizine (ZYRTEC) 10 MG tablet Take 10 mg by mouth daily. (Patient not taking: Reported on 02/24/2021)    . EPINEPHrine 0.3 mg/0.3 mL IJ SOAJ injection Inject 0.3 mg into the muscle as needed for anaphylaxis. (Patient not taking: Reported on 02/24/2021) 2 each 0   No current facility-administered medications for this visit.   Review of Systems General:  no complaints Skin: no complaints Eyes: no complaints HEENT: no complaints Breasts: no complaints Pulmonary: no complaints Cardiac: no complaints Gastrointestinal: no complaints Genitourinary/Sexual: no complaints Ob/Gyn: no complaints Musculoskeletal: no complaints Hematology: no complaints Neurologic/Psych: no complaints   Objective:  Physical Examination:  Today's Vitals   02/24/21 1052 02/24/21 1056  BP: 123/83   Pulse: 70   Resp: 20   Temp: 98.7 F (37.1 C)   SpO2: 100%   Weight: 238 lb (108 kg)   PainSc:  0-No pain   Body mass index is 35.66 kg/m.   ECOG Performance Status: 0 - Asymptomatic  GENERAL: Patient is a well appearing female in no acute distress HEENT:  Sclera clear. Anicteric NODES:  Negative axillary, supraclavicular, inguinal lymph node survery LUNGS:  Clear to auscultation on the right, but decreased breath sounds on the left.  HEART:  Regular rate and rhythm.  ABDOMEN:  Soft, nontender.  No hernias, incisions well healed. No masses or ascites EXTREMITIES:   No peripheral edema. Atraumatic. No cyanosis. Limited mobility in right hip SKIN:  Clear with no obvious rashes or skin changes.  NEURO:  Nonfocal. Well oriented.  Appropriate affect.   Pelvic: exam chaperoned by nurse;  Vulva: normal appearing vulva with no masses, tenderness or lesions; Vagina: foreshortened but o/w normal vagina, no lesions or masses; Adnexa: surgically absent, BMI negative for masses; Uterus: surgically absent, vaginal cuff well healed; Cervix: absent; Rectal: confirmatory   Assessment:  Karen Mann is a 60 y.o. female diagnosed with Stage IIIA grade 3 endometrioid endometrial adenocarcinoma 8/19 on endometrial biopsy.  She had  pleural effusions that required multiple thoracenteses and were suspicious for cancer.  CA125 elevated at 622. Received 3 cycles of carbo/taxol chemotherapy and developed DVT and pulmonary emboli and these were treated with Lovonox and IVC filter.  Then underwent TAH, BSO, omentectomy 10/29/18 for removal of uterus and very large right ovarian metastasis that was adherent to the pelvic structures. No evidence of carcinomatosis. Parts of the mass that were adherent to the pelvic peritoneum after mass was removed had cancer and were excised.  Then had 3 additional cycles of carboplatin/taxol.  CT scan C/A/P 2/20 showed area of soft tissue asymmetry above left vaginal cuff, but I did not feel anything on pelvic exam.  External pelvic radiation and vaginal brachytherapy 2/20. Repeat CT scan A/P 4/20 and 9/20 stable.  No change in vaginal cuff area. She feels well and has no complaints. CA125 stable at 6.  NED except on exam decreased BS on the left and h/o pleural effusions.   Transitioned from Eliquis to ASA for PEs diagnosed 9/19 and IVC filter removed 2/20.  Tumor Testing- Microsatellite stable, TMB low (3). Foundation One PD-L1 - TPS 0%, PTEN, TP53, PIK3CA mutations.  Germline genetic testing negative in 11/19. In vitae panel showed possible significant  variant in NF1. Genetic counselor discussed significance with patient.   Plan:   Problem List Items Addressed This Visit      Genitourinary   Endometrial adenocarcinoma ([place]) - Primary   Relevant Orders   DG Chest 2 View      Obtain CXR PA and lateral. If negative we will see her in clinic for exam in 6 months alternating with Dr. Tasia Catchings. We will see her  sooner if concerning symptoms arise.  CA125 was ordered with her visit today. CA125 in 12/2020 reassuring.  Since CA125 is a good marker for her cancer will not order CT scan unless it is rising or new symptoms.    She will see Dr Tasia Catchings in 3 months for continued management.    I personally interviewed and examined the patient and determined the plan and assessment. I provided all counseling. Beckey Rutter, NP scribed the note.   Leotta Weingarten Gaetana Michaelis, MD   CC:  Virginia Crews, MD [address] [place],  [place] [address] [phone]

## 2021-02-25 LAB — CA 125: Cancer Antigen (CA) 125: 6.2 U/mL (ref 0.0–38.1)

## 2021-04-01 ENCOUNTER — Other Ambulatory Visit: Payer: 59

## 2021-06-10 ENCOUNTER — Other Ambulatory Visit: Payer: Self-pay

## 2021-06-10 ENCOUNTER — Other Ambulatory Visit: Payer: 59

## 2021-06-10 ENCOUNTER — Ambulatory Visit: Payer: Self-pay | Admitting: Oncology

## 2021-06-10 ENCOUNTER — Ambulatory Visit: Payer: 59 | Admitting: Oncology

## 2021-08-31 ENCOUNTER — Telehealth: Payer: Self-pay

## 2021-08-31 NOTE — Telephone Encounter (Signed)
Trial:  [facility] [place]-2102 Peripheral Neuropathy Patient Karen Mann was identified by Jeral Fruit, RN and Dr. Tasia Catchings as a potential candidate for the above listed study. Research nurse reached out to the patient ahead of her appointment on Wednesday, September 9,6116 to ascertain her interest in participating in the clinical trial.  This Clinical Research Nurse met with Karen Mann, [id number], today via a telephone conversation, patient was identified using her name and date of birth.  Patient is Unaccompanied.  A copy of the informed consent document and separate HIPAA Authorization were reviewed briefly with the patient.  Patient states her pain is less than a 3, she doesn't have pain daily.  States her neuropathy pain is only at night, but not on a regular basis and she takes no medications for her pain currently. Patient understands she would not be eligible for the protocol at this time due to her pain level. The patient states she would love to participate if she could to possibly help others, but she is glad she doesn't qualify for the study.   Approximately 20 minutes were spent with the patient reviewing the informed consent documents. Research nurse thanked the patient for her willingness to participate and encouraged her to call if she has any further questions.  Jeral Fruit, RN 08/31/21 11:42 AM

## 2021-09-01 ENCOUNTER — Inpatient Hospital Stay: Payer: 59 | Attending: Obstetrics and Gynecology

## 2021-09-01 ENCOUNTER — Inpatient Hospital Stay (HOSPITAL_BASED_OUTPATIENT_CLINIC_OR_DEPARTMENT_OTHER): Payer: 59 | Admitting: Obstetrics and Gynecology

## 2021-09-01 ENCOUNTER — Encounter: Payer: Self-pay | Admitting: Oncology

## 2021-09-01 ENCOUNTER — Other Ambulatory Visit: Payer: Self-pay

## 2021-09-01 VITALS — BP 120/84 | HR 65 | Temp 97.0°F | Resp 18 | Wt 260.6 lb

## 2021-09-01 DIAGNOSIS — Z90710 Acquired absence of both cervix and uterus: Secondary | ICD-10-CM | POA: Diagnosis not present

## 2021-09-01 DIAGNOSIS — Z923 Personal history of irradiation: Secondary | ICD-10-CM | POA: Diagnosis not present

## 2021-09-01 DIAGNOSIS — Z8542 Personal history of malignant neoplasm of other parts of uterus: Secondary | ICD-10-CM | POA: Diagnosis not present

## 2021-09-01 DIAGNOSIS — Z9221 Personal history of antineoplastic chemotherapy: Secondary | ICD-10-CM | POA: Diagnosis not present

## 2021-09-01 DIAGNOSIS — C541 Malignant neoplasm of endometrium: Secondary | ICD-10-CM

## 2021-09-01 DIAGNOSIS — Z90722 Acquired absence of ovaries, bilateral: Secondary | ICD-10-CM | POA: Diagnosis not present

## 2021-09-01 NOTE — Progress Notes (Signed)
Gynecologic Oncology Progress Note   Referring Provider:  Dr. Gilman Schmidt  Chief Complaint: Stage IIIA grade 3 endometrial adenocarcinoma  Subjective:  Karen Mann is a 60 y.o. with stage IIIA grade 3 endometrioid adenocarcinoma of the endometrium s/p 3 cycles neoadjuvant carbo-Taxol followed by ex-lap, TAH-BSO, omentectomy, and right ureterolysis, followed by 3 cycles of adjuvant carbo-taxol, pelvic ERT and vaginal brachytherapy.   She returns today for surveillance. Doing well overall.  Occasional mild RLQ pain, but no GI or GU complaints. Not sexually active. Has gained significant amount of work since retiring.    She had the R hip replacement 09/2020 and is very happy with the results. The IV portacath was removed. She saw Dr. Tasia Catchings on 12/08/2020 and had a reassuring exam.    CA 125 has been followed and normal (< 10) since 1/20    Oncology History She was seen in GYN oncology clinic and reported continual vaginal bleeding for 5-6 months.  She was found to be significantly anemic and started on iron pills.  She was also having significant dyspnea. Patient initially referred to Dr. Gilman Schmidt by PCP, Dr. Brita Romp, d/t large abdominal mass seen on pelvic ultrasound concerning for ovarian cancer. Patient first noticed bloating, fatigue, and constipation in July 2019. She presented to PCP. FSH 6.4, LH 5.8. 07/23/18- Ultrasound was performed which showed: pelvic mass measuring 20 x 13 x 22 cm with cystic and solid components. Ultrasound also showed: 4-5cm fibroid and thickened endometrium of 21m  08/05/18- MR PELVIS W WO CONTRAST - 20.6 cm (17.1 x 20.6 x 20.0 cm) complex cystic/solid mass in the pelvis w/ numerous thickened/enhancing septations, compatible with malignant ovarian neoplasm. Neither ovary is discretely visualized. - Small volume abdominopelvic ascites. Gross peritoneal disease is not visualized but cannot be excluded. - Left hydroureteronephrosis secondary to extrinsic compression. -  5.3 intramural anterior uterine body fibroid  On exam, Dr. SGilman Schmidtnoted bilateral lower extremity swelling with mass extending close to sternum, ~ 30 cm. Endometrial biopsy was performed. Pathology: Endometrioid Adenocarcinoma consistent with FIGO grade 3  CA 125  08/06/2018 622.0  08/09/18- CT C/A/P-imaging showed a large cystic/solid mass measuring 28 x 11.6 x 21.4 cm suspicious for ovarian malignancy along with moderate ascites, large right pleural effusion, and moderate left and mild right hydronephrosis attributed to ureteral compression due to mass.   08/15/2018-initiated carboplatin AUC 6 and paclitaxel 175 mg/m.    CA 125- 692  09/05/2018-cycle 2 carboplatin AUC 6 and paclitaxel 175 mg/m.  [id number]  Mild hypersensitivity to paclitaxel with chest tightness. Taxol. Solu-Medrol and Benadryl given along with fluid bolus.  Challenged and patient was able to tolerate treatment at slower rate.  09/21/2018-planned for hysterectomy, however, patient diagnosed with PE/DVT.  CT chest PE protocol- 1.  Bilateral pulmonary emboli, most centrally in the bilateral main pulmonary arteries.  No evidence of right heart strain or pulmonary infarct. 2.  Moderate right pleural effusion 3.  Partially visualized left hydronephrosis  UKoreabilateral lower extremities revealed 1.  Deep vein thrombus in the right popliteal vein, thrombus extends into the right tibioperoneal trunk and proximal and mid portions of the right peroneal veins. 2.  Deep vein thrombus in the left common femoral vein and in the left popliteal vein. 3.  Occlusive thrombus in seen on in the left greater saphenous vein, superficial vein.  Echo revealed LVEF 55%.   09/26/2018-underwent placement of IVC filter in IR.  She was started on therapeutic Lovenox.   She proceeded with cycle 3 carboplatin AUC  6 and paclitaxel 175 mg/m with Neulasta support on 09/26/2018.   Ca 125- 521  10/19/2018-CT C/A/P 1. Redemonstrated large cystic and solid mass  measuring similarly in size. 2. Improved left hydronephrosis. 3.  Improved ascites with redemonstration of a moderate right pleural effusion.  Redemonstrated large cystic solid pelvic mass, dorsal to the uterus and inseparable from the ovaries, measuring approximately 22.8 x 21.9 x 12.3 cm previously measuring 21.6 x 21.2 x 14.8 cm.  Slightly improved ascites.  She underwent palliative thoracentesis on 10/26/2018, 10/11/18, 10/02/18, 09/19/18, 09/10/18, 08/30/18, 08/22/18, 08/16/18, 08/10/18. Pleural fluid was suspicious for malignancy on 10/02/18 with rare atypical PAX-8 and MOC-31 positive cells present.   10/29/18- patient underwent exploratory laparotomy, total abdominal hysterectomy, bilateral salpingo-oophorectomy, omentectomy, and right ureterolysis at [facility]. EBL 500 ml and she received 2 units of PRBCs during her admission postoperatively. Removal of uterus and very large right ovarian metastasis that was adherent to the pelvic structures. No evidence of carcinomatosis. Parts of the mass that were adherent to the pelvic peritoneum after it was removed were also resected and had cancer.    Pathology-  DIAGNOSIS A. Mesenteric nodule, excision:  - Benign inclusion with transitional type epithelium. Negative for malignancy.    B. Right pelvic peritoneum, excision: - Mesothelial-lined fibromuscular and adipose tissue. Negative for malignancy.    C. Appendices epiploica, excision: - Metastatic adenocarcinoma.    D. Omentum, omentectomy:  - Omentum. Negative for malignancy.    E. Uterus, bilateral ovaries and fallopian tubes, hysterectomy and bilateral salpingo-oophorectomy:  - Endometrioid adenocarcinoma of the endometrium. See note FIGO grade: 3 of 3. Tumor size: 3.5 cm Depth of invasion: 3 mm in a 37 mm thick myometrium Cervical involvement: Not identified  Adnexal involvement: Metastatic carcinoma involves bilateral ovaries. Lymphovascular invasion: Not identified   MSI-stable  -Abdominal  fluid-negative.  No evidence of malignancy.   She completed 3 additional cycles of adjuvant chemotherapy with carbo-Taxol on 11/21/2018-01/02/2019.  01/21/2019-CT C/A/P 1. Interval resection of dominant abdominopelvic mass with resolution of abdominal ascites 2. Hysterectomy with asymmetric soft tissue at the left superior portion of the vaginal cuff. Cannot exclude residual or recurrent disease in this area. This could either be re-evaluated at CT follow-up of 3 months or more entirely characterized with PET 3. Otherwise, no evidence of metastatic disease in the chest, abdomen, or pelvis 4. Cholelithiasis 5.  Possible constipation  She initiated pelvic radiation with Dr. Baruch Gouty 01/22/2019 because fragments of ovarian mass that were peeled off the pelvic peritoneum after main mass removed had cancer. Finished radiation in 2/20 including pelvic ERT and vaginal bracytherapy.   02/07/19 IVC filter removed and she is takes Eliquis.  4/20 and 9/20  Follow up CT scans in showed stability of vaginal cuff and pelvic exams normal.   CA 125 58.3 11/21/2018 6.0 01/02/2019 6.6  12/28/2020  3/22 CXR for decreased BS normal.  Genetic Testing-  11/19 Germline genetic testing with [facility] was negative for mutations in BRCA1/2 and other HR pathway genes.  Possible mosaic variant in NF1 (not clinically significant per genetic counselor) and VUS in POLE and RECQL4.  Tumor Testing- Microsatellite stable, TMB low (3) -Foundation One PD-L1 - TPS 0% PTEN, TP53, PIK3CA mutations  Family History: She reports her mother died of ovarian cancer at age 79 (diagnosed at age 53).  Screening She has had a colonoscopy on 10/31/2019 which showed diverticulosis, otherwise normal, and recommendation to repeat in 10 years around 10/2029.  Mammogram 01/06/2020 reported as bi-rads category 1: negative.  Family History  Problem Relation Age of Onset   Hypertension Father    Ovarian cancer Mother 65       deceased 83    Healthy Brother    Skin cancer Paternal Grandmother        deceased 38s   Cancer Paternal Grandfather        unk. primary; deceased 17s   Ovarian cancer Other        mat grandmother's sister    Problem List: Patient Active Problem List   Diagnosis Date Noted   Class 2 obesity without serious comorbidity with body mass index (BMI) of 35.0 to 35.9 in adult 11/10/2020   Chronic anticoagulation 01/02/2019   Encounter for antineoplastic chemotherapy 01/02/2019   Neuropathy due to chemotherapeutic drug ([place]) 01/02/2019   S/P insertion of IVC (inferior vena caval) filter 09/26/2018   Bilateral pulmonary embolism ([place]) 09/22/2018   DVT (deep venous thrombosis) ([place]) 09/22/2018   Anxiety associated with cancer diagnosis ([place]) 09/07/2018   Recurrent pleural effusion on right 09/07/2018   Chemotherapy-induced peripheral neuropathy ([place]) 09/07/2018   Iron deficiency anemia due to chronic blood loss 08/10/2018   Goals of care, counseling/discussion 08/10/2018   Endometrial adenocarcinoma ([place]) 08/10/2018   Arthritis 12/01/2015   Secondary pulmonary hypertension 11/24/2015   Allergic rhinitis 03/02/2010   Varicose veins of lower extremities with inflammation 01/06/2010   Chronic venous insufficiency 01/06/2010   Nonrheumatic tricuspid valve disorder 12/30/2009   Fibroid 12/09/2009   Family history of malignant neoplasm of ovary 12/02/2009    Past Medical History: Past Medical History:  Diagnosis Date   Allergy    Anemia 07/20/2018   Endometrial adenocarcinoma ([place]) 07/2018   with chemo and rad tx   Fibroid uterus 07/24/2018   5cm/2 inch    Ovarian mass 07/24/2018   22cm/10 inch   Pelvic mass    Personal history of chemotherapy 2019   Personal history of radiation therapy 2020    Past Surgical History: Past Surgical History:  Procedure Laterality Date   ABDOMINAL HYSTERECTOMY  10/2018   COLONOSCOPY WITH PROPOFOL N/A 10/31/2019   Procedure: COLONOSCOPY WITH PROPOFOL;  Surgeon:  Virgel Manifold, MD;  Location: [facility] ENDOSCOPY;  Service: Endoscopy;  Laterality: N/A;   EYE SURGERY  2000   Lasik   IVC FILTER REMOVAL Right 02/07/2019   Procedure: IVC FILTER REMOVAL;  Surgeon: Algernon Huxley, MD;  Location: [place] [facility];  Service: Cardiovascular;  Laterality: Right;   PORTA CATH INSERTION N/A 08/13/2018   Procedure: PORTA CATH INSERTION;  Surgeon: Algernon Huxley, MD;  Location: [place] [facility];  Service: Cardiovascular;  Laterality: N/A;   PORTA CATH REMOVAL N/A 09/03/2020   Procedure: PORTA CATH REMOVAL;  Surgeon: Algernon Huxley, MD;  Location: [place] [facility];  Service: Cardiovascular;  Laterality: N/A;   Removal of IVC filter   02/07/2019   chemo completed    TOTAL HIP ARTHROPLASTY Right 10/08/2020    Past Gynecologic History:  G1P1 Age of Menarche: 65 Regular menstrual periods Denies abnormal pap smears Denies history of STDs  OB History:  OB History  Gravida Para Term Preterm AB Living  _0 SAB IAB Ectopic Multiple Live Births          1    # Outcome Date GA Lbr Len/2nd Weight Sex Delivery Anes PTL Lv  1 Term 09/03/85 [redacted]w[redacted]d 9 lb 2.5 oz (4.153 kg) M Vag-Spont   LIV  Family History: Family History  Problem Relation Age of Onset   Hypertension Father    Ovarian cancer Mother 66       deceased 34   Healthy Brother    Skin cancer Paternal Grandmother        deceased 48s   Cancer Paternal Grandfather        unk. primary; deceased 47s   Ovarian cancer Other        mat grandmother's sister    Social History: Social History   Socioeconomic History   Marital status: Married    Spouse name: Robbie    Number of children: 1   Years of education: Not on file   Highest education level: Not on file  Occupational History   Not on file  Tobacco Use   Smoking status: Never   Smokeless tobacco: Never  Vaping Use   Vaping Use: Never used  Substance and Sexual Activity   Alcohol use: No   Drug use: No   Sexual activity: Not  Currently    Birth control/protection: Post-menopausal  Other Topics Concern   Not on file  Social History Narrative   Not on file   Social Determinants of Health   Financial Resource Strain: Not on file  Food Insecurity: Not on file  Transportation Needs: Not on file  Physical Activity: Not on file  Stress: Not on file  Social Connections: Not on file  Intimate Partner Violence: Not on file    Allergies: No Known Allergies  Current Medications: Current Outpatient Medications  Medication Sig Dispense Refill   aspirin EC 81 MG tablet Take 81 mg by mouth daily. Swallow whole. (Patient not taking: Reported on 09/01/2021)     cetirizine (ZYRTEC) 10 MG tablet Take 10 mg by mouth daily. (Patient not taking: No sig reported)     EPINEPHrine 0.3 mg/0.3 mL IJ SOAJ injection Inject 0.3 mg into the muscle as needed for anaphylaxis. (Patient not taking: No sig reported) 2 each 0   No current facility-administered medications for this visit.   Review of Systems General:  no complaints Skin: no complaints Eyes: no complaints HEENT: no complaints Breasts: no complaints Pulmonary: no complaints Cardiac: no complaints Gastrointestinal: no complaints Genitourinary/Sexual: no complaints Ob/Gyn: no complaints Musculoskeletal: no complaints Hematology: no complaints Neurologic/Psych: no complaints   Objective:  Physical Examination:  Today's Vitals   09/01/21 1111 09/01/21 1112  BP: 120/84   Pulse: 65   Resp: 18   Temp: (!) 97 F (36.1 C)   SpO2: 97%   Weight: 260 lb 9 oz (118.2 kg)   PainSc:  0-No pain   Body mass index is 39.04 kg/m.   ECOG Performance Status: 0 - Asymptomatic  GENERAL: Patient is a well appearing female in no acute distress HEENT:  Sclera clear. Anicteric NODES:  Negative axillary, supraclavicular, inguinal lymph node survery LUNGS:  Clear to auscultation on the right, but decreased breath sounds on the left.  HEART:  Regular rate and rhythm.   ABDOMEN:  Soft, nontender.  No hernias, incisions well healed. No masses or ascites EXTREMITIES:  No peripheral edema. Atraumatic. No cyanosis. Limited mobility in right hip SKIN:  Clear with no obvious rashes or skin changes.  NEURO:  Nonfocal. Well oriented.  Appropriate affect.  Pelvic: exam chaperoned by nurse;  Vulva: normal appearing vulva with no masses, tenderness or lesions; Vagina: foreshortened but o/w normal vagina, no lesions or masses; Adnexa: surgically absent, BMI negative for masses; Uterus: surgically absent, vaginal cuff well  healed; Cervix: absent; Rectal: confirmatory   Assessment:  Karen Mann is a 60 y.o. female diagnosed with Stage IIIA grade 3 endometrioid endometrial adenocarcinoma 8/19 on endometrial biopsy.  She had pleural effusions that required multiple thoracenteses and were suspicious for cancer.  CA125 elevated at 622. Received 3 cycles of carbo/taxol chemotherapy and developed DVT and pulmonary emboli and these were treated with Lovonox and IVC filter.  Then underwent TAH, BSO, omentectomy 10/29/18 for removal of uterus and very large right ovarian metastasis that was adherent to the pelvic structures. No evidence of carcinomatosis. Parts of the mass that were adherent to the pelvic peritoneum after mass was removed had cancer and were excised.  Then had 3 additional cycles of carboplatin/taxol.  CT scan C/A/P 2/20 showed area of soft tissue asymmetry above left vaginal cuff, but I did not feel anything on pelvic exam.  External pelvic radiation and vaginal brachytherapy 2/20. Repeat CT scan A/P 4/20 and 9/20 stable.  No change in vaginal cuff area. She feels well and has no complaints. CA125 stable at 6.  On exam 3/22 decreased BS on the left and h/o pleural effusions. CXR normal. NED today.   Transitioned from Eliquis to ASA for PEs diagnosed 9/19 and IVC filter removed 2/20.  Tumor Testing- Microsatellite stable, TMB low (3). Foundation One PD-L1 - TPS 0%,  PTEN, TP53, PIK3CA mutations.  She has gained about 25 pounds in the past year.    Filmy adhesions in the vagina due to brachytherapy and these released easily.  Not sexually active.   Germline genetic testing negative in 11/19. In vitae panel showed possible significant variant in NF1. Genetic counselor discussed significance with patient.   Plan:   Problem List Items Addressed This Visit       Genitourinary   Endometrial adenocarcinoma ([place]) - Primary    We will see her in clinic for exam in 6 months alternating with Dr. Tasia Catchings. We will see her  sooner if concerning symptoms arise.  CA125 was ordered with her visit today. CA125 in 02/2021 reassuring.  Since CA125 is a good marker for her cancer will not order CT scan unless it is rising or new symptoms.    Discussed need for weight loss and she is in agreement.   Will monitor vaginal adhesions and give her a candle if this recurs.   She will see Dr Tasia Catchings in 3 months for continued management.    I personally interviewed and examined the patient and determined the plan and assessment. I provided all counseling. Beckey Rutter, NP scribed the note.  Mellody Drown, MD  CC:  [address]ia Crews, [place] [address],  [place] [address] [phone]

## 2021-09-02 LAB — CA 125: Cancer Antigen (CA) 125: 6.6 U/mL (ref 0.0–38.1)

## 2021-11-11 ENCOUNTER — Encounter: Payer: Self-pay | Admitting: Family Medicine

## 2021-12-23 ENCOUNTER — Encounter: Payer: Self-pay | Admitting: Oncology

## 2022-03-02 ENCOUNTER — Encounter: Payer: Self-pay | Admitting: Oncology

## 2022-03-02 ENCOUNTER — Inpatient Hospital Stay: Payer: 59 | Attending: Obstetrics and Gynecology | Admitting: Nurse Practitioner

## 2022-03-02 ENCOUNTER — Other Ambulatory Visit: Payer: Self-pay

## 2022-03-02 ENCOUNTER — Inpatient Hospital Stay: Payer: 59

## 2022-03-02 VITALS — BP 137/91 | HR 66 | Temp 98.0°F | Resp 18 | Wt 262.0 lb

## 2022-03-02 DIAGNOSIS — I2729 Other secondary pulmonary hypertension: Secondary | ICD-10-CM | POA: Insufficient documentation

## 2022-03-02 DIAGNOSIS — I872 Venous insufficiency (chronic) (peripheral): Secondary | ICD-10-CM | POA: Diagnosis not present

## 2022-03-02 DIAGNOSIS — I071 Rheumatic tricuspid insufficiency: Secondary | ICD-10-CM | POA: Insufficient documentation

## 2022-03-02 DIAGNOSIS — R188 Other ascites: Secondary | ICD-10-CM | POA: Insufficient documentation

## 2022-03-02 DIAGNOSIS — Z8041 Family history of malignant neoplasm of ovary: Secondary | ICD-10-CM | POA: Insufficient documentation

## 2022-03-02 DIAGNOSIS — G620 Drug-induced polyneuropathy: Secondary | ICD-10-CM | POA: Diagnosis not present

## 2022-03-02 DIAGNOSIS — Z7982 Long term (current) use of aspirin: Secondary | ICD-10-CM | POA: Diagnosis not present

## 2022-03-02 DIAGNOSIS — M1611 Unilateral primary osteoarthritis, right hip: Secondary | ICD-10-CM | POA: Insufficient documentation

## 2022-03-02 DIAGNOSIS — M25559 Pain in unspecified hip: Secondary | ICD-10-CM | POA: Insufficient documentation

## 2022-03-02 DIAGNOSIS — Z7901 Long term (current) use of anticoagulants: Secondary | ICD-10-CM | POA: Diagnosis not present

## 2022-03-02 DIAGNOSIS — Z9079 Acquired absence of other genital organ(s): Secondary | ICD-10-CM | POA: Insufficient documentation

## 2022-03-02 DIAGNOSIS — C541 Malignant neoplasm of endometrium: Secondary | ICD-10-CM | POA: Diagnosis not present

## 2022-03-02 DIAGNOSIS — Z9221 Personal history of antineoplastic chemotherapy: Secondary | ICD-10-CM | POA: Diagnosis not present

## 2022-03-02 DIAGNOSIS — Z08 Encounter for follow-up examination after completed treatment for malignant neoplasm: Secondary | ICD-10-CM

## 2022-03-02 DIAGNOSIS — Z8542 Personal history of malignant neoplasm of other parts of uterus: Secondary | ICD-10-CM | POA: Diagnosis not present

## 2022-03-02 DIAGNOSIS — Z90710 Acquired absence of both cervix and uterus: Secondary | ICD-10-CM | POA: Insufficient documentation

## 2022-03-02 DIAGNOSIS — S0500XA Injury of conjunctiva and corneal abrasion without foreign body, unspecified eye, initial encounter: Secondary | ICD-10-CM | POA: Insufficient documentation

## 2022-03-02 DIAGNOSIS — Z923 Personal history of irradiation: Secondary | ICD-10-CM | POA: Diagnosis not present

## 2022-03-02 DIAGNOSIS — C55 Malignant neoplasm of uterus, part unspecified: Secondary | ICD-10-CM | POA: Insufficient documentation

## 2022-03-02 DIAGNOSIS — I2699 Other pulmonary embolism without acute cor pulmonale: Secondary | ICD-10-CM | POA: Diagnosis not present

## 2022-03-02 DIAGNOSIS — J90 Pleural effusion, not elsewhere classified: Secondary | ICD-10-CM | POA: Insufficient documentation

## 2022-03-02 DIAGNOSIS — K8020 Calculus of gallbladder without cholecystitis without obstruction: Secondary | ICD-10-CM | POA: Insufficient documentation

## 2022-03-02 DIAGNOSIS — I340 Nonrheumatic mitral (valve) insufficiency: Secondary | ICD-10-CM | POA: Insufficient documentation

## 2022-03-02 DIAGNOSIS — D500 Iron deficiency anemia secondary to blood loss (chronic): Secondary | ICD-10-CM | POA: Insufficient documentation

## 2022-03-02 NOTE — Progress Notes (Signed)
Gynecologic Oncology Progress Note   Referring Provider:  Dr. Gilman Schmidt  Chief Complaint: Stage IIIA grade 3 endometrial adenocarcinoma  Subjective:  Karen Mann is a 61 y.o. with stage IIIA grade 3 endometrioid adenocarcinoma of the endometrium s/p 3 cycles neoadjuvant carbo-Taxol followed by ex-lap, TAH-BSO, omentectomy, and right ureterolysis, followed by 3 cycles of adjuvant carbo-taxol, pelvic ERT and vaginal brachytherapy, completed treatment in March 2020. NED since.   She continues to feel well and denies pelvic complaints. She has some upper abdominal pain, sharp. Has noticed a small bulge. Has started working from home. Continues to struggle with weight gain. Has noticed some swelling in her feet which is slightly worse. Has not had mammogram.   CA 125 has been followed and normal (< 10) since 1/20.     Oncology History She was seen in GYN oncology clinic and reported continual vaginal bleeding for 5-6 months.  She was found to be significantly anemic and started on iron pills.  She was also having significant dyspnea. Patient initially referred to Dr. Gilman Schmidt by PCP, Dr. Brita Romp, d/t large abdominal mass seen on pelvic ultrasound concerning for ovarian cancer. Patient first noticed bloating, fatigue, and constipation in July 2019. She presented to PCP. FSH 6.4, LH 5.8. 07/23/18- Ultrasound was performed which showed: pelvic mass measuring 20 x 13 x 22 cm with cystic and solid components. Ultrasound also showed: 4-5cm fibroid and thickened endometrium of 109m  08/05/18- MR PELVIS W WO CONTRAST - 20.6 cm (17.1 x 20.6 x 20.0 cm) complex cystic/solid mass in the pelvis w/ numerous thickened/enhancing septations, compatible with malignant ovarian neoplasm. Neither ovary is discretely visualized. - Small volume abdominopelvic ascites. Gross peritoneal disease is not visualized but cannot be excluded. - Left hydroureteronephrosis secondary to extrinsic compression. - 5.3 intramural  anterior uterine body fibroid  On exam, Dr. SGilman Schmidtnoted bilateral lower extremity swelling with mass extending close to sternum, ~ 30 cm. Endometrial biopsy was performed. Pathology: Endometrioid Adenocarcinoma consistent with FIGO grade 3  CA 125  08/06/2018 622.0  08/09/18- CT C/A/P-imaging showed a large cystic/solid mass measuring 28 x 11.6 x 21.4 cm suspicious for ovarian malignancy along with moderate ascites, large right pleural effusion, and moderate left and mild right hydronephrosis attributed to ureteral compression due to mass.   08/15/2018-initiated carboplatin AUC 6 and paclitaxel 175 mg/m.    CA 125- 692  09/05/2018-cycle 2 carboplatin AUC 6 and paclitaxel 175 mg/m.  [id number]  Mild hypersensitivity to paclitaxel with chest tightness. Taxol. Solu-Medrol and Benadryl given along with fluid bolus.  Challenged and patient was able to tolerate treatment at slower rate.  09/21/2018-planned for hysterectomy, however, patient diagnosed with PE/DVT.  CT chest PE protocol- 1.  Bilateral pulmonary emboli, most centrally in the bilateral main pulmonary arteries.  No evidence of right heart strain or pulmonary infarct. 2.  Moderate right pleural effusion 3.  Partially visualized left hydronephrosis  UKoreabilateral lower extremities revealed 1.  Deep vein thrombus in the right popliteal vein, thrombus extends into the right tibioperoneal trunk and proximal and mid portions of the right peroneal veins. 2.  Deep vein thrombus in the left common femoral vein and in the left popliteal vein. 3.  Occlusive thrombus in seen on in the left greater saphenous vein, superficial vein.  Echo revealed LVEF 55%.   09/26/2018-underwent placement of IVC filter in IR.  She was started on therapeutic Lovenox.   She proceeded with cycle 3 carboplatin AUC 6 and paclitaxel 175 mg/m with Neulasta support on  09/26/2018.   Ca 125- 521  10/19/2018-CT C/A/P 1. Redemonstrated large cystic and solid mass measuring  similarly in size. 2. Improved left hydronephrosis. 3.  Improved ascites with redemonstration of a moderate right pleural effusion.  Redemonstrated large cystic solid pelvic mass, dorsal to the uterus and inseparable from the ovaries, measuring approximately 22.8 x 21.9 x 12.3 cm previously measuring 21.6 x 21.2 x 14.8 cm.  Slightly improved ascites.  She underwent palliative thoracentesis on 10/26/2018, 10/11/18, 10/02/18, 09/19/18, 09/10/18, 08/30/18, 08/22/18, 08/16/18, 08/10/18. Pleural fluid was suspicious for malignancy on 10/02/18 with rare atypical PAX-8 and MOC-31 positive cells present.   10/29/18- patient underwent exploratory laparotomy, total abdominal hysterectomy, bilateral salpingo-oophorectomy, omentectomy, and right ureterolysis at [facility] EBL 500 ml and she received 2 units of PRBCs during her admission postoperatively. Removal of uterus and very large right ovarian metastasis that was adherent to the pelvic structures. No evidence of carcinomatosis. Parts of the mass that were adherent to the pelvic peritoneum after it was removed were also resected and had cancer.    Pathology-  DIAGNOSIS A. Mesenteric nodule, excision:  - Benign inclusion with transitional type epithelium. Negative for malignancy.    B. Right pelvic peritoneum, excision: - Mesothelial-lined fibromuscular and adipose tissue. Negative for malignancy.    C. Appendices epiploica, excision: - Metastatic adenocarcinoma.    D. Omentum, omentectomy:  - Omentum. Negative for malignancy.    E. Uterus, bilateral ovaries and fallopian tubes, hysterectomy and bilateral salpingo-oophorectomy:  - Endometrioid adenocarcinoma of the endometrium. See note FIGO grade: 3 of 3. Tumor size: 3.5 cm Depth of invasion: 3 mm in a 37 mm thick myometrium Cervical involvement: Not identified  Adnexal involvement: Metastatic carcinoma involves bilateral ovaries. Lymphovascular invasion: Not identified   MSI-stable  -Abdominal  fluid-negative.  No evidence of malignancy.   She completed 3 additional cycles of adjuvant chemotherapy with carbo-Taxol on 11/21/2018-01/02/2019.  01/21/2019-CT C/A/P 1. Interval resection of dominant abdominopelvic mass with resolution of abdominal ascites 2. Hysterectomy with asymmetric soft tissue at the left superior portion of the vaginal cuff. Cannot exclude residual or recurrent disease in this area. This could either be re-evaluated at CT follow-up of 3 months or more entirely characterized with PET 3. Otherwise, no evidence of metastatic disease in the chest, abdomen, or pelvis 4. Cholelithiasis 5.  Possible constipation  She initiated pelvic radiation with Dr. Baruch Gouty 01/22/2019 because fragments of ovarian mass that were peeled off the pelvic peritoneum after main mass removed had cancer. Finished radiation in 2/20 including pelvic ERT and vaginal bracytherapy.   02/07/19 IVC filter removed and she is takes Eliquis.  4/20 and 9/20  Follow up CT scans in showed stability of vaginal cuff and pelvic exams normal.   She had the R hip replacement 09/2020 and is very happy with the results. The IV portacath was removed.   CA 125 58.3 11/21/2018 6.0 01/02/2019 6.6  12/28/2020 6.2 02/24/21 6/6 09/01/21  3/22 CXR for decreased BS normal.  Genetic Testing-  11/19 Germline genetic testing with [facility] was negative for mutations in BRCA1/2 and other HR pathway genes.  Possible mosaic variant in NF1 (not clinically significant per genetic counselor) and VUS in POLE and RECQL4.  Tumor Testing- Microsatellite stable, TMB low (3) -Foundation One PD-L1 - TPS 0% PTEN, TP53, PIK3CA mutations  Family History: She reports her mother died of ovarian cancer at age 50 (diagnosed at age 79).  Screening She has had a colonoscopy on 10/31/2019 which showed diverticulosis, otherwise normal, and recommendation  to repeat in 10 years around 10/2029.  Mammogram 01/06/2020 reported as bi-rads category 1:  negative.    Family History  Problem Relation Age of Onset   Hypertension Father    Ovarian cancer Mother 93       deceased 27   Healthy Brother    Skin cancer Paternal Grandmother        deceased 44s   Cancer Paternal Grandfather        unk. primary; deceased 62s   Ovarian cancer Other        mat grandmother's sister    Problem List: Patient Active Problem List   Diagnosis Date Noted   History of total hysterectomy with bilateral salpingo-oophorectomy (BSO) 03/02/2022   Osteoarthritis of right hip 03/02/2022   Corneal abrasion 03/02/2022   Malignant neoplasm of uterus ([place]) 03/02/2022   Hip pain 03/02/2022   Mitral valve regurgitation 03/02/2022   Tricuspid valve regurgitation 03/02/2022   Class 2 obesity without serious comorbidity with body mass index (BMI) of 35.0 to 35.9 in adult 11/10/2020   History of total hip arthroplasty 10/12/2020   Chronic anticoagulation 01/02/2019   Encounter for antineoplastic chemotherapy 01/02/2019   Neuropathy due to chemotherapeutic drug ([place]) 01/02/2019   S/P insertion of IVC (inferior vena caval) filter 09/26/2018   Bilateral pulmonary embolism ([place]) 09/22/2018   DVT (deep venous thrombosis) ([place]) 09/22/2018   Anxiety associated with cancer diagnosis ([place]) 09/07/2018   Recurrent pleural effusion on right 09/07/2018   Chemotherapy-induced peripheral neuropathy ([place]) 09/07/2018   Iron deficiency anemia due to chronic blood loss 08/10/2018   Goals of care, counseling/discussion 08/10/2018   Endometrial adenocarcinoma ([place]) 08/10/2018   Arthritis 12/01/2015   Secondary pulmonary hypertension 11/24/2015   Allergic rhinitis 03/02/2010   Varicose veins of lower extremities with inflammation 01/06/2010   Chronic venous insufficiency 01/06/2010   Nonrheumatic tricuspid valve disorder 12/30/2009   Fibroid 12/09/2009   Family history of malignant neoplasm of ovary 12/02/2009    Past Medical History: Past Medical History:  Diagnosis Date    Allergy    Anemia 07/20/2018   Endometrial adenocarcinoma ([place]) 07/2018   with chemo and rad tx   Fibroid uterus 07/24/2018   5cm/2 inch    Ovarian mass 07/24/2018   22cm/10 inch   Pelvic mass    Personal history of chemotherapy 2019   Personal history of radiation therapy 2020    Past Surgical History: Past Surgical History:  Procedure Laterality Date   ABDOMINAL HYSTERECTOMY  10/2018   COLONOSCOPY WITH PROPOFOL N/A 10/31/2019   Procedure: COLONOSCOPY WITH PROPOFOL;  Surgeon: Virgel Manifold, MD;  Location: [facility] ENDOSCOPY;  Service: Endoscopy;  Laterality: N/A;   EYE SURGERY  2000   Lasik   IVC FILTER REMOVAL Right 02/07/2019   Procedure: IVC FILTER REMOVAL;  Surgeon: Algernon Huxley, MD;  Location: [facility];  Service: Cardiovascular;  Laterality: Right;   PORTA CATH INSERTION N/A 08/13/2018   Procedure: PORTA CATH INSERTION;  Surgeon: Algernon Huxley, MD;  Location: [facility] [facility];  Service: Cardiovascular;  Laterality: N/A;   PORTA CATH REMOVAL N/A 09/03/2020   Procedure: PORTA CATH REMOVAL;  Surgeon: Algernon Huxley, MD;  Location: [place] [facility];  Service: Cardiovascular;  Laterality: N/A;   Removal of IVC filter   02/07/2019   chemo completed    TOTAL HIP ARTHROPLASTY Right 10/08/2020    Past Gynecologic History:  G1P1 Age of Menarche: 66 Regular menstrual periods Denies abnormal pap smears Denies history  of STDs  OB History:  OB History  Gravida Para Term Preterm AB Living  _0 SAB IAB Ectopic Multiple Live Births          1    # Outcome Date GA Lbr Len/2nd Weight Sex Delivery Anes PTL Lv  1 Term 09/03/85 [redacted]w[redacted]d 9 lb 2.5 oz (4.153 kg) M Vag-Spont   LIV    Family History: Family History  Problem Relation Age of Onset   Hypertension Father    Ovarian cancer Mother 533      deceased 566  Healthy Brother    Skin cancer Paternal Grandmother        deceased 796s  Cancer Paternal Grandfather        unk. primary; deceased 635s   Ovarian cancer Other        mat grandmother's sister    Social History: Social History   Socioeconomic History   Marital status: Married    Spouse name: Robbie    Number of children: 1   Years of education: Not on file   Highest education level: Not on file  Occupational History   Not on file  Tobacco Use   Smoking status: Never   Smokeless tobacco: Never  Vaping Use   Vaping Use: Never used  Substance and Sexual Activity   Alcohol use: No   Drug use: No   Sexual activity: Not Currently    Birth control/protection: Post-menopausal  Other Topics Concern   Not on file  Social History Narrative   Not on file   Social Determinants of Health   Financial Resource Strain: Not on file  Food Insecurity: Not on file  Transportation Needs: Not on file  Physical Activity: Not on file  Stress: Not on file  Social Connections: Not on file  Intimate Partner Violence: Not on file    Allergies: No Known Allergies  Current Medications: Current Outpatient Medications  Medication Sig Dispense Refill   acetaminophen (TYLENOL) 325 MG tablet Take 650 mg by mouth every 6 (six) hours as needed.     aspirin EC 81 MG tablet Take 81 mg by mouth daily. Swallow whole. (Patient not taking: Reported on 09/01/2021)     cetirizine (ZYRTEC) 10 MG tablet Take 10 mg by mouth daily. (Patient not taking: Reported on 02/24/2021)     EPINEPHrine 0.3 mg/0.3 mL IJ SOAJ injection Inject 0.3 mg into the muscle as needed for anaphylaxis. (Patient not taking: Reported on 02/24/2021) 2 each 0   No current facility-administered medications for this visit.   Review of Systems General:  no complaints Skin: no complaints Eyes: no complaints HEENT: no complaints Breasts: no complaints Pulmonary: no complaints Cardiac: no complaints Gastrointestinal: no complaints Genitourinary/Sexual: no complaints Ob/Gyn: no complaints Musculoskeletal: no complaints Hematology: no complaints Neurologic/Psych: no  complaints   Objective:  Physical Examination:  Today's Vitals   03/02/22 1044 03/02/22 1046 03/02/22 1047  BP:   (!) 137/91  Pulse:   66  Resp:   18  Temp:   98 F (36.7 C)  TempSrc:   Tympanic  SpO2:   96%  Weight: 262 lb (118.8 kg)    PainSc:  0-No pain 0-No pain   Body mass index is 39.26 kg/m.   ECOG Performance Status: 0 - Asymptomatic  GENERAL: Patient is a well appearing female in no acute distress HEENT:  Sclera clear. Anicteric NODES:  Negative axillary, supraclavicular, inguinal lymph node survery  LUNGS:  Clear to auscultation bilaterally HEART:  Regular rate and rhythm ABDOMEN:  Soft, nontender.  Incisional hernia at upper midline incision. Easily reduces. Non-tender. No other masses or ascites EXTREMITIES:  1+ bilateral pedal edema. Left > right. Atraumatic. No cyanosis. Limited ROM in hips.  SKIN:  Clear with no obvious rashes or skin changes.  NEURO:  Nonfocal. Well oriented.  Appropriate affect.  Pelvic: exam chaperoned by CMA;  Vulva: normal appearing vulva with no masses, tenderness or lesions; Vagina: foreshortened. Atrophic. No lesions, masses. Adnexa: surgically absent but no masses palpated. Exam limited d/t habitus. Uterus, Cervix, Adnexa: surgically absent. Rectal: deferred  Assessment:  Karen Mann is a 61 y.o. female diagnosed with Stage IIIA grade 3 endometrioid endometrial adenocarcinoma 8/19 on endometrial biopsy.  She had pleural effusions that required multiple thoracenteses and were suspicious for cancer.  CA125 elevated at 622. Received 3 cycles of carbo/taxol chemotherapy and developed DVT and pulmonary emboli and these were treated with Lovonox and IVC filter.  Then underwent TAH, BSO, omentectomy 10/29/18 for removal of uterus and very large right ovarian metastasis that was adherent to the pelvic structures. No evidence of carcinomatosis. Parts of the mass that were adherent to the pelvic peritoneum after mass was removed had cancer and were  excised.  Then had 3 additional cycles of carboplatin/taxol.  CT scan C/A/P 2/20 showed area of soft tissue asymmetry above left vaginal cuff, but I did not feel anything on pelvic exam.  External pelvic radiation and vaginal brachytherapy 2/20. Repeat CT scan A/P 4/20 and 9/20 stable.  No change in vaginal cuff area. She feels well and has no complaints. CA125 stable at 6.  On exam 3/22 decreased BS on the left and h/o pleural effusions. CXR normal. NED today.   Transitioned from Eliquis to ASA for PEs diagnosed 9/19 and IVC filter removed 2/20.  Tumor Testing- Microsatellite stable, TMB low (3). Foundation One PD-L1 - TPS 0%, PTEN, TP53, PIK3CA mutations.  Obesity- BMI 39.3  Hernia- Suspect incisional hernia given location. Easily reduces. Asymptomatic.   Dependent edema  Screening for breast cancer- last mammogram January 2021.   Vaginal adhesions- secondary to brachytherapy. Also, foreshortening. No adhesions today. Not sexually active.   Germline genetic testing negative in 11/19. In vitae panel showed possible significant variant in NF1. Genetic counselor discussed significance with patient.   Plan:   Problem List Items Addressed This Visit   None  Continue 6 month surveillance visits with pelvic exam. CA 125 is pending today. Will continue to follow. CA 125 is a good marker for her cancer and I wouldn't recommend surveillance imaging unless she has rising tumor marker or concerning symptoms.    Obesity- BMI 39.3. Provided patient with number for [facility] and she can self- refer. Lengthy discussion regarding role of adiposity and impact on recurrent hormonally driven malignancies.   Hernia- should she become symptomatic, could consider surgical management  Dependent edema- encouraged frequent physical activity and compression  Vaginal adhesions- consider candle exercises if recurrent symptoms.   Breast screening- will order mammogram today.   Please  contact Dr. Tasia Catchings for continued surveillance or she can be followed by gyn-onc.   Verlon Au, NP  CC:  Karen Crews, MD [address] [place],  [place] [address] [phone]

## 2022-03-02 NOTE — Patient Instructions (Signed)
[facility] in [place] ? ?Start using compression stockings 15-20 mm Hg at least knee high to help with swelling.  ? ?If hernia is bothersome, let me know and we can discuss management options.  ? ?It's always great to see you. If you have concerning symptoms, questions, or concerns in the interim, please call the clinic.  ?

## 2022-03-03 LAB — CA 125: Cancer Antigen (CA) 125: 6.5 U/mL (ref 0.0–38.1)

## 2022-04-18 ENCOUNTER — Ambulatory Visit
Admission: RE | Admit: 2022-04-18 | Discharge: 2022-04-18 | Disposition: A | Discharge status: Home / Self Care | Payer: 59 | Source: Ambulatory Visit | Attending: Nurse Practitioner | Admitting: Nurse Practitioner

## 2022-04-18 DIAGNOSIS — Z1231 Encounter for screening mammogram for malignant neoplasm of breast: Secondary | ICD-10-CM | POA: Insufficient documentation

## 2022-04-18 DIAGNOSIS — Z8542 Personal history of malignant neoplasm of other parts of uterus: Secondary | ICD-10-CM | POA: Insufficient documentation

## 2022-04-18 DIAGNOSIS — Z08 Encounter for follow-up examination after completed treatment for malignant neoplasm: Secondary | ICD-10-CM | POA: Diagnosis present

## 2022-06-06 ENCOUNTER — Other Ambulatory Visit: Payer: Self-pay | Admitting: Nurse Practitioner

## 2022-08-31 ENCOUNTER — Inpatient Hospital Stay: Payer: 59

## 2022-09-14 ENCOUNTER — Other Ambulatory Visit: Payer: Self-pay | Admitting: *Deleted

## 2022-09-14 ENCOUNTER — Encounter: Payer: Self-pay | Admitting: Nurse Practitioner

## 2022-09-14 ENCOUNTER — Inpatient Hospital Stay: Payer: 59

## 2022-09-14 ENCOUNTER — Inpatient Hospital Stay: Payer: 59 | Attending: Obstetrics and Gynecology | Admitting: Nurse Practitioner

## 2022-09-14 VITALS — BP 135/90 | HR 69 | Temp 97.8°F | Resp 19 | Wt 266.6 lb

## 2022-09-14 DIAGNOSIS — C541 Malignant neoplasm of endometrium: Secondary | ICD-10-CM

## 2022-09-14 DIAGNOSIS — Z923 Personal history of irradiation: Secondary | ICD-10-CM | POA: Insufficient documentation

## 2022-09-14 DIAGNOSIS — Z90710 Acquired absence of both cervix and uterus: Secondary | ICD-10-CM | POA: Insufficient documentation

## 2022-09-14 DIAGNOSIS — Z9221 Personal history of antineoplastic chemotherapy: Secondary | ICD-10-CM | POA: Insufficient documentation

## 2022-09-14 DIAGNOSIS — Z6839 Body mass index (BMI) 39.0-39.9, adult: Secondary | ICD-10-CM | POA: Diagnosis not present

## 2022-09-14 DIAGNOSIS — Z08 Encounter for follow-up examination after completed treatment for malignant neoplasm: Secondary | ICD-10-CM | POA: Diagnosis not present

## 2022-09-14 DIAGNOSIS — E669 Obesity, unspecified: Secondary | ICD-10-CM | POA: Insufficient documentation

## 2022-09-14 DIAGNOSIS — Z8542 Personal history of malignant neoplasm of other parts of uterus: Secondary | ICD-10-CM | POA: Insufficient documentation

## 2022-09-14 DIAGNOSIS — Z90722 Acquired absence of ovaries, bilateral: Secondary | ICD-10-CM | POA: Diagnosis not present

## 2022-09-14 NOTE — Progress Notes (Signed)
Gynecologic Oncology Progress Note   Referring Provider:  Dr. Gilman Schmidt  Chief Complaint: Stage IIIA grade 3 endometrial adenocarcinoma  Subjective:  Karen Mann is a 61 y.o. with stage IIIA grade 3 endometrioid adenocarcinoma of the endometrium s/p 3 cycles neoadjuvant carbo-Taxol followed by ex-lap, TAH-BSO, omentectomy, and right ureterolysis, followed by 3 cycles of adjuvant carbo-taxol, pelvic ERT and vaginal brachytherapy, completed treatment in March 2020. NED since.   She continues to feel well and denies pelvic complaints. Had one episode of pain in her hernia after exercise which has resolved. She continues to work. Continues to enjoy spending time at home in [place] with her husband.   CA 125 has been followed and normal (< 10) since 1/20.     Oncology History She was seen in GYN oncology clinic and reported continual vaginal bleeding for 5-6 months.  She was found to be significantly anemic and started on iron pills.  She was also having significant dyspnea. Patient initially referred to Dr. Gilman Schmidt by PCP, Dr. Brita Romp, d/t large abdominal mass seen on pelvic ultrasound concerning for ovarian cancer. Patient first noticed bloating, fatigue, and constipation in July 2019. She presented to PCP. FSH 6.4, LH 5.8. 07/23/18- Ultrasound was performed which showed: pelvic mass measuring 20 x 13 x 22 cm with cystic and solid components. Ultrasound also showed: 4-5cm fibroid and thickened endometrium of 35m  08/05/18- MR PELVIS W WO CONTRAST - 20.6 cm (17.1 x 20.6 x 20.0 cm) complex cystic/solid mass in the pelvis w/ numerous thickened/enhancing septations, compatible with malignant ovarian neoplasm. Neither ovary is discretely visualized. - Small volume abdominopelvic ascites. Gross peritoneal disease is not visualized but cannot be excluded. - Left hydroureteronephrosis secondary to extrinsic compression. - 5.3 intramural anterior uterine body fibroid  On exam, Dr. SGilman Schmidtnoted  bilateral lower extremity swelling with mass extending close to sternum, ~ 30 cm. Endometrial biopsy was performed. Pathology: Endometrioid Adenocarcinoma consistent with FIGO grade 3  CA 125  08/06/2018 622.0  08/09/18- CT C/A/P-imaging showed a large cystic/solid mass measuring 28 x 11.6 x 21.4 cm suspicious for ovarian malignancy along with moderate ascites, large right pleural effusion, and moderate left and mild right hydronephrosis attributed to ureteral compression due to mass.   08/15/2018-initiated carboplatin AUC 6 and paclitaxel 175 mg/m.    CA 125- 692  09/05/2018-cycle 2 carboplatin AUC 6 and paclitaxel 175 mg/m.  [id number]  Mild hypersensitivity to paclitaxel with chest tightness. Taxol. Solu-Medrol and Benadryl given along with fluid bolus.  Challenged and patient was able to tolerate treatment at slower rate.  09/21/2018-planned for hysterectomy, however, patient diagnosed with PE/DVT.  CT chest PE protocol- 1.  Bilateral pulmonary emboli, most centrally in the bilateral main pulmonary arteries.  No evidence of right heart strain or pulmonary infarct. 2.  Moderate right pleural effusion 3.  Partially visualized left hydronephrosis  UKoreabilateral lower extremities revealed 1.  Deep vein thrombus in the right popliteal vein, thrombus extends into the right tibioperoneal trunk and proximal and mid portions of the right peroneal veins. 2.  Deep vein thrombus in the left common femoral vein and in the left popliteal vein. 3.  Occlusive thrombus in seen on in the left greater saphenous vein, superficial vein.  Echo revealed LVEF 55%.   09/26/2018-underwent placement of IVC filter in IR.  She was started on therapeutic Lovenox.   She proceeded with cycle 3 carboplatin AUC 6 and paclitaxel 175 mg/m with Neulasta support on 09/26/2018.   Ca 125- 521  10/19/2018-CT  C/A/P 1. Redemonstrated large cystic and solid mass measuring similarly in size. 2. Improved left hydronephrosis. 3.  Improved  ascites with redemonstration of a moderate right pleural effusion.  Redemonstrated large cystic solid pelvic mass, dorsal to the uterus and inseparable from the ovaries, measuring approximately 22.8 x 21.9 x 12.3 cm previously measuring 21.6 x 21.2 x 14.8 cm.  Slightly improved ascites.  She underwent palliative thoracentesis on 10/26/2018, 10/11/18, 10/02/18, 09/19/18, 09/10/18, 08/30/18, 08/22/18, 08/16/18, 08/10/18. Pleural fluid was suspicious for malignancy on 10/02/18 with rare atypical PAX-8 and MOC-31 positive cells present.   10/29/18- patient underwent exploratory laparotomy, total abdominal hysterectomy, bilateral salpingo-oophorectomy, omentectomy, and right ureterolysis at [facility]. EBL 500 ml and she received 2 units of PRBCs during her admission postoperatively. Removal of uterus and very large right ovarian metastasis that was adherent to the pelvic structures. No evidence of carcinomatosis. Parts of the mass that were adherent to the pelvic peritoneum after it was removed were also resected and had cancer.    Pathology-  DIAGNOSIS A. Mesenteric nodule, excision:  - Benign inclusion with transitional type epithelium. Negative for malignancy.    B. Right pelvic peritoneum, excision: - Mesothelial-lined fibromuscular and adipose tissue. Negative for malignancy.    C. Appendices epiploica, excision: - Metastatic adenocarcinoma.    D. Omentum, omentectomy:  - Omentum. Negative for malignancy.    E. Uterus, bilateral ovaries and fallopian tubes, hysterectomy and bilateral salpingo-oophorectomy:  - Endometrioid adenocarcinoma of the endometrium. See note FIGO grade: 3 of 3. Tumor size: 3.5 cm Depth of invasion: 3 mm in a 37 mm thick myometrium Cervical involvement: Not identified  Adnexal involvement: Metastatic carcinoma involves bilateral ovaries. Lymphovascular invasion: Not identified   MSI-stable  -Abdominal fluid-negative.  No evidence of malignancy.   She completed 3 additional  cycles of adjuvant chemotherapy with carbo-Taxol on 11/21/2018-01/02/2019.  01/21/2019-CT C/A/P 1. Interval resection of dominant abdominopelvic mass with resolution of abdominal ascites 2. Hysterectomy with asymmetric soft tissue at the left superior portion of the vaginal cuff. Cannot exclude residual or recurrent disease in this area. This could either be re-evaluated at CT follow-up of 3 months or more entirely characterized with PET 3. Otherwise, no evidence of metastatic disease in the chest, abdomen, or pelvis 4. Cholelithiasis 5.  Possible constipation  She initiated pelvic radiation with Dr. Baruch Gouty 01/22/2019 because fragments of ovarian mass that were peeled off the pelvic peritoneum after main mass removed had cancer. Finished radiation in 2/20 including pelvic ERT and vaginal bracytherapy.   02/07/19 IVC filter removed and she is takes Eliquis.  4/20 and 9/20  Follow up CT scans in showed stability of vaginal cuff and pelvic exams normal.   She had the R hip replacement 09/2020 and is very happy with the results. The IV portacath was removed.   CA 125 58.3 11/21/2018 6.0 01/02/2019 6.6  12/28/2020 6.2 02/24/21 6/6 09/01/21  3/22 CXR for decreased BS normal.  Genetic Testing-  11/19 Germline genetic testing with [facility] was negative for mutations in BRCA1/2 and other HR pathway genes.  Possible mosaic variant in NF1 (not clinically significant per genetic counselor) and VUS in POLE and RECQL4.  Tumor Testing- Microsatellite stable, TMB low (3) -Foundation One PD-L1 - TPS 0% PTEN, TP53, PIK3CA mutations  Family History: She reports her mother died of ovarian cancer at age 71 (diagnosed at age 46).  Screening: She has had a colonoscopy on 10/31/2019 which showed diverticulosis, otherwise normal, and recommendation to repeat in 10 years around 10/2029.  Mammogram 01/06/2020 reported as bi-rads category 1: negative.    Family History  Problem Relation Age of Onset   Ovarian cancer  Mother 87       deceased 44   Hypertension Father    Skin cancer Paternal Grandmother        deceased 103s   Cancer Paternal Grandfather        unk. primary; deceased 34s   Healthy Brother    Ovarian cancer Other        mat grandmother's sister   Breast cancer Neg Hx     Problem List: Patient Active Problem List   Diagnosis Date Noted   History of total hysterectomy with bilateral salpingo-oophorectomy (BSO) 03/02/2022   Osteoarthritis of right hip 03/02/2022   Corneal abrasion 03/02/2022   Malignant neoplasm of uterus ([place]) 03/02/2022   Hip pain 03/02/2022   Mitral valve regurgitation 03/02/2022   Tricuspid valve regurgitation 03/02/2022   Class 2 obesity without serious comorbidity with body mass index (BMI) of 35.0 to 35.9 in adult 11/10/2020   History of total hip arthroplasty 10/12/2020   Chronic anticoagulation 01/02/2019   Encounter for antineoplastic chemotherapy 01/02/2019   Neuropathy due to chemotherapeutic drug ([place]) 01/02/2019   S/P insertion of IVC (inferior vena caval) filter 09/26/2018   Bilateral pulmonary embolism ([place]) 09/22/2018   DVT (deep venous thrombosis) ([place]) 09/22/2018   Anxiety associated with cancer diagnosis ([place]) 09/07/2018   Recurrent pleural effusion on right 09/07/2018   Chemotherapy-induced peripheral neuropathy ([place]) 09/07/2018   Iron deficiency anemia due to chronic blood loss 08/10/2018   Goals of care, counseling/discussion 08/10/2018   Endometrial adenocarcinoma ([place]) 08/10/2018   Arthritis 12/01/2015   Secondary pulmonary hypertension 11/24/2015   Allergic rhinitis 03/02/2010   Varicose veins of lower extremities with inflammation 01/06/2010   Chronic venous insufficiency 01/06/2010   Nonrheumatic tricuspid valve disorder 12/30/2009   Fibroid 12/09/2009   Family history of malignant neoplasm of ovary 12/02/2009    Past Medical History: Past Medical History:  Diagnosis Date   Allergy    Anemia 07/20/2018   Endometrial  adenocarcinoma ([place]) 07/2018   with chemo and rad tx   Fibroid uterus 07/24/2018   5cm/2 inch    Ovarian mass 07/24/2018   22cm/10 inch   Pelvic mass    Personal history of chemotherapy 2019   Personal history of radiation therapy 2020    Past Surgical History: Past Surgical History:  Procedure Laterality Date   ABDOMINAL HYSTERECTOMY  10/2018   COLONOSCOPY WITH PROPOFOL N/A 10/31/2019   Procedure: COLONOSCOPY WITH PROPOFOL;  Surgeon: Virgel Manifold, MD;  Location: [facility] ENDOSCOPY;  Service: Endoscopy;  Laterality: N/A;   EYE SURGERY  2000   Lasik   IVC FILTER REMOVAL Right 02/07/2019   Procedure: IVC FILTER REMOVAL;  Surgeon: Algernon Huxley, MD;  Location: [facility];  Service: Cardiovascular;  Laterality: Right;   PORTA CATH INSERTION N/A 08/13/2018   Procedure: PORTA CATH INSERTION;  Surgeon: Algernon Huxley, MD;  Location: [facility] [facility];  Service: Cardiovascular;  Laterality: N/A;   PORTA CATH REMOVAL N/A 09/03/2020   Procedure: PORTA CATH REMOVAL;  Surgeon: Algernon Huxley, MD;  Location: [place] [facility];  Service: Cardiovascular;  Laterality: N/A;   Removal of IVC filter   02/07/2019   chemo completed    TOTAL HIP ARTHROPLASTY Right 10/08/2020    Past Gynecologic History:  G1P1 Age of Menarche: 76 Regular menstrual periods Denies abnormal pap smears Denies history of  STDs  OB History:  OB History  Gravida Para Term Preterm AB Living  _0 SAB IAB Ectopic Multiple Live Births          1    # Outcome Date GA Lbr Len/2nd Weight Sex Delivery Anes PTL Lv  1 Term 09/03/85 [redacted]w[redacted]d 9 lb 2.5 oz (4.153 kg) M Vag-Spont   LIV    Family History: Family History  Problem Relation Age of Onset   Ovarian cancer Mother 577      deceased 579  Hypertension Father    Skin cancer Paternal Grandmother        deceased 73s  Cancer Paternal Grandfather        unk. primary; deceased 634s  Healthy Brother    Ovarian cancer Other        mat grandmother's  sister   Breast cancer Neg Hx     Social History: Social History   Socioeconomic History   Marital status: Married    Spouse name: Robbie    Number of children: 1   Years of education: Not on file   Highest education level: Not on file  Occupational History   Not on file  Tobacco Use   Smoking status: Never   Smokeless tobacco: Never  Vaping Use   Vaping Use: Never used  Substance and Sexual Activity   Alcohol use: No   Drug use: No   Sexual activity: Not Currently    Birth control/protection: Post-menopausal  Other Topics Concern   Not on file  Social History Narrative   Not on file   Social Determinants of Health   Financial Resource Strain: Not on file  Food Insecurity: No Food Insecurity (02/07/2019)   Hunger Vital Sign    Worried About Running Out of Food in the Last Year: Never true    Ran Out of Food in the Last Year: Never true  Transportation Needs: No Transportation Needs (02/07/2019)   PRAPARE - THydrologist(Medical): No    Lack of Transportation (Non-Medical): No  Physical Activity: Unknown (02/07/2019)   Exercise Vital Sign    Days of Exercise per Week: 0 days    Minutes of Exercise per Session: Not on file  Stress: Not on file  Social Connections: Unknown (02/07/2019)   Social Connection and Isolation Panel [NHANES]    Frequency of Communication with Friends and Family: More than three times a week    Frequency of Social Gatherings with Friends and Family: Not on file    Attends Religious Services: Not on file    Active Member of Clubs or Organizations: Not on file    Attends CArchivistMeetings: Not on file    Marital Status: Married  Intimate Partner Violence: Not At Risk (02/07/2019)   Humiliation, Afraid, Rape, and Kick questionnaire    Fear of Current or Ex-Partner: No    Emotionally Abused: No    Physically Abused: No    Sexually Abused: No    Allergies: No Known Allergies  Current  Medications: Current Outpatient Medications  Medication Sig Dispense Refill   acetaminophen (TYLENOL) 325 MG tablet Take 650 mg by mouth every 6 (six) hours as needed. (Patient not taking: Reported on 09/14/2022)     aspirin EC 81 MG tablet Take 81 mg by mouth daily. Swallow whole. (Patient not taking: Reported on 09/01/2021)     cetirizine (ZYRTEC) 10 MG tablet  Take 10 mg by mouth daily. (Patient not taking: Reported on 02/24/2021)     EPINEPHrine 0.3 mg/0.3 mL IJ SOAJ injection Inject 0.3 mg into the muscle as needed for anaphylaxis. (Patient not taking: Reported on 02/24/2021) 2 each 0   No current facility-administered medications for this visit.   Review of Systems General:  no complaints Skin: no complaints Eyes: no complaints HEENT: no complaints Breasts: no complaints Pulmonary: no complaints Cardiac: no complaints Gastrointestinal: no complaints Genitourinary/Sexual: no complaints Ob/Gyn: no complaints Musculoskeletal: no complaints Hematology: no complaints Neurologic/Psych: no complaints    Objective:  Physical Examination:  Today's Vitals   09/14/22 1124  BP: (!) 135/90  Pulse: 69  Resp: 19  Temp: 97.8 F (36.6 C)  SpO2: 97%  Weight: 266 lb 9.6 oz (120.9 kg)   Body mass index is 39.95 kg/m.   ECOG Performance Status: 0 - Asymptomatic  GENERAL: Patient is a well appearing female in no acute distress HEENT:  Sclera clear. Anicteric NODES:  Negative axillary, supraclavicular, inguinal lymph node survery LUNGS:  Clear to auscultation bilaterally.   HEART:  Regular rate and rhythm.  ABDOMEN:  Soft, nontender. Incisions are well healed. No masses or ascites. Incisional hernia at upper midline incision. Easily reducible.  EXTREMITIES:  No peripheral edema. Atraumatic. No cyanosis SKIN:  Clear with no obvious rashes or skin changes.  NEURO:  Nonfocal. Well oriented.  Appropriate affect.  Pelvic: exam chaperoned by NP student, Lovena Le;  Vulva: normal appearing vulva  with no masses, tenderness or lesions; Vagina: foreshortened. Atrophic. No lesions, masses. Adnexa: smooth. Uterus, Cervix, Ovaries: surgically absent. Rectal: deferred  Assessment:  Karen Mann is a 61 y.o. female diagnosed with Stage IIIA grade 3 endometrioid endometrial adenocarcinoma 8/19 on endometrial biopsy.  She had pleural effusions that required multiple thoracenteses and were suspicious for cancer.  CA125 elevated at 622. Received 3 cycles of carbo/taxol chemotherapy and developed DVT and pulmonary emboli and these were treated with Lovonox and IVC filter.  Then underwent TAH, BSO, omentectomy 10/29/18 for removal of uterus and very large right ovarian metastasis that was adherent to the pelvic structures. No evidence of carcinomatosis. Parts of the mass that were adherent to the pelvic peritoneum after mass was removed had cancer and were excised.  Then had 3 additional cycles of carboplatin/taxol.  CT scan C/A/P 2/20 showed area of soft tissue asymmetry above left vaginal cuff, but I did not feel anything on pelvic exam.  External pelvic radiation and vaginal brachytherapy 2/20. Repeat CT scan A/P 4/20 and 9/20 stable.  No change in vaginal cuff area. She feels well and has no complaints. CA125 stable at 6.  On exam 3/22 decreased BS on the left and h/o pleural effusions. CXR normal. NED today.   Transitioned from Eliquis to ASA for PEs diagnosed 9/19 and IVC filter removed 2/20.  Tumor Testing- Microsatellite stable, TMB low (3). Foundation One PD-L1 - TPS 0%, PTEN, TP53, PIK3CA mutations.  Obesity- BMI 39.95  Hernia- Suspect incisional hernia given location. Easily reduces. Asymptomatic.   Dependent edema.   Screening for breast cancer- bi-rads category 1: negative.   Vaginal adhesions- secondary to brachytherapy. Also, foreshortening. No adhesions today. Not sexually active.   Germline genetic testing negative in 11/19. In vitae panel showed possible significant variant in NF1.  Genetic counselor discussed significance with patient.   Plan:   Problem List Items Addressed This Visit   None  Continue 6 month surveillance visits with pelvic exam. CA 125 is pending  today. Will continue to follow. CA 125 is a good marker for her cancer and I wouldn't recommend surveillance imaging unless she has rising tumor marker or concerning symptoms.  We reviewed survivorship topics   Obesity- BMI 39.95. Awaiting appointment at [facility]. Previously reviewed role of adiposity and impact on recurrent hormonally driven malignancies.   Hernia- should she become symptomatic, could consider surgical management. Monitor for now.   Dependent edema- encouraged frequent physical activity and compression. Stable.   Vaginal adhesions- consider candle exercises if recurrent symptoms. Not bothersome today. Stable.   Breast screening- mammogram from 04/18/22 was reported as bi-rads category 1: negative. Plan to repeat in April 2024.   Disposition:  6 mo- lab (ca 125), [place] for endometrial cancer surveillance- la  Verlon Au, NP  CC:  Virginia Crews, MD

## 2022-09-16 LAB — CA 125: Cancer Antigen (CA) 125: 5.6 U/mL (ref 0.0–38.1)

## 2022-09-26 ENCOUNTER — Ambulatory Visit (INDEPENDENT_AMBULATORY_CARE_PROVIDER_SITE_OTHER): Payer: 59 | Admitting: Internal Medicine

## 2022-09-26 VITALS — BP 137/91 | HR 64 | Ht 67.0 in | Wt 263.8 lb

## 2022-09-26 DIAGNOSIS — Z0289 Encounter for other administrative examinations: Secondary | ICD-10-CM

## 2022-09-26 DIAGNOSIS — Z6841 Body Mass Index (BMI) 40.0 and over, adult: Secondary | ICD-10-CM

## 2022-09-26 NOTE — Progress Notes (Signed)
Office: [phone]  /  Fax: [phone]  Initial Visit  Karen Mann was seen in clinic today to evaluate for obesity. She is interested in losing weight to improve overall health and reduce the risk of weight related complications.  She has a history of uterine cancer and is status post hysterectomy and oophorectomy and remains in remission.  She reports before her cancer diagnosis in 2019 she had lost 100 pounds because of exercise as well as eating healthy.  She also was doing intermittent fasting sometimes up to 48 hours continuously.  Since her treatment she has had progressive weight gain.  She also encountered high levels of stress.  She thinks weight gain also related to aging, hormonal changes and physical inactivity.  She denies any obesity related complications at present.  She presents today to review program treatment options, initial physical assessment, and evaluation.      Past medical history includes:   Past Medical History:  Diagnosis Date   Allergy    Anemia 07/20/2018   Endometrial adenocarcinoma ([place]) 07/2018   with chemo and rad tx   Fibroid uterus 07/24/2018   5cm/2 inch    Ovarian mass 07/24/2018   22cm/10 inch   Pelvic mass    Personal history of chemotherapy 2019   Personal history of radiation therapy 2020     Objective:   BP (!) 137/91   Pulse 64   Ht '5\' 7"'$  (1.702 m)   Wt 263 lb 12.8 oz (119.7 kg)   LMP 08/13/2018 Comment: patient continues to bleed  SpO2 97%   BMI 41.32 kg/m  She was weighed on the bioimpedance scale:  Body mass index is 41.32 kg/m.  General:  Alert, oriented and cooperative. Patient is in no acute distress.  Respiratory: Normal respiratory effort, no problems with respiration noted  Extremities: Normal range of motion.    Mental Status: Normal mood and affect. Normal behavior. Normal judgment and thought content.   Assessment and Plan:  1. Class 3 severe obesity without serious comorbidity with body mass index (BMI)  of 40.0 to 44.9 in adult, unspecified obesity type [facility])   Patient counseled on weight and contributing factors.  We also discussed the benefits of weight loss therapy and losing 10 to 15% of body weight.  She would benefit from a reduced calorie high-protein meal plan.  She also inquired about intermittent fasting and would like to incorporate this into any sort of nutritional plan.  We first need to assess nutritional requirements as well as for cardiometabolic derangements.  This will be completed at a future office visit.     Obesity Treatment Plan:  She will work on garnering support from family and friends to begin weight loss journey. Work on eliminating or reducing the presence of highly processed, calorie dense foods in the home. Complete provided nutritional and psychosocial assessment questionnaire.    ANJELA CASSARA will follow up in the next 1-2 weeks to review the above steps and continue evaluation and treatment.  Obesity Education Performed Today:  She was weighed on the bioimpedance scale and results were discussed and documented in the synopsis.  We discussed obesity as a disease and the importance of a more detailed evaluation of all the factors contributing to the disease.  We discussed the importance of long term lifestyle changes which include nutrition, exercise and behavioral modifications as well as the importance of customizing this to her specific health and social needs.  We discussed the benefits of reaching a  healthier weight to alleviate the symptoms of existing conditions and reduce the risks of the biomechanical, metabolic and psychological effects of obesity.  We discussed the goals of this program is to improve her overall health and not simply achieve a specific BMI.  Frequent visits are very important to patient success. I plan to see her every 2 weeks for the first 3 months and then evaluate the visit frequency after that time. I explained obesity is a  life-long chronic disease and long term treatments would be required. Medications to help her follow his eating plan may be offered as appropriate but are not required. All medication decisions will be made together after the initial workup is done and benefits and side effects are discussed in depth.  The clinic rules were reviewed including the late policy, cancellation policy, no show and program fees.  KELICIA YOUTZ appears to be in the action stage of change and states they are ready to start intensive lifestyle modifications and behavioral modifications.  30 minutes was spent today on this visit including the above counseling, pre-visit chart review, and post-visit documentation.  Thomes Dinning, MD

## 2022-10-07 ENCOUNTER — Encounter: Payer: Self-pay | Admitting: Family Medicine

## 2022-10-07 NOTE — Progress Notes (Deleted)
Complete physical exam   Patient: Karen Mann   DOB: 02-21-1961   61 y.o. Female  MRN: [id number] Visit Date: 10/07/2022  Today's healthcare provider: Lavon Paganini, MD   No chief complaint on file.  Subjective    Karen Mann is a 61 y.o. female who presents today for a complete physical exam.  HPI    Past Medical History:  Diagnosis Date   Allergy    Anemia 07/20/2018   Endometrial adenocarcinoma ([place]) 07/2018   with chemo and rad tx   Fibroid uterus 07/24/2018   5cm/2 inch    Ovarian mass 07/24/2018   22cm/10 inch   Pelvic mass    Personal history of chemotherapy 2019   Personal history of radiation therapy 2020   Past Surgical History:  Procedure Laterality Date   ABDOMINAL HYSTERECTOMY  10/2018   COLONOSCOPY WITH PROPOFOL N/A 10/31/2019   Procedure: COLONOSCOPY WITH PROPOFOL;  Surgeon: Virgel Manifold, MD;  Location: [facility];  Service: Endoscopy;  Laterality: N/A;   EYE SURGERY  2000   Lasik   IVC FILTER REMOVAL Right 02/07/2019   Procedure: IVC FILTER REMOVAL;  Surgeon: Algernon Huxley, MD;  Location: [facility];  Service: Cardiovascular;  Laterality: Right;   PORTA CATH INSERTION N/A 08/13/2018   Procedure: PORTA CATH INSERTION;  Surgeon: Algernon Huxley, MD;  Location: [facility];  Service: Cardiovascular;  Laterality: N/A;   PORTA CATH REMOVAL N/A 09/03/2020   Procedure: PORTA CATH REMOVAL;  Surgeon: Algernon Huxley, MD;  Location: [facility];  Service: Cardiovascular;  Laterality: N/A;   Removal of IVC filter   02/07/2019   chemo completed    TOTAL HIP ARTHROPLASTY Right 10/08/2020   Social History   Socioeconomic History   Marital status: Married    Spouse name: Robbie    Number of children: 1   Years of education: Not on file   Highest education level: Not on file  Occupational History   Not on file  Tobacco Use   Smoking status: Never   Smokeless tobacco: Never  Vaping Use   Vaping Use: Never used   Substance and Sexual Activity   Alcohol use: No   Drug use: No   Sexual activity: Not Currently    Birth control/protection: Post-menopausal  Other Topics Concern   Not on file  Social History Narrative   Not on file   Social Determinants of Health   Financial Resource Strain: Not on file  Food Insecurity: No Food Insecurity (02/07/2019)   Hunger Vital Sign    Worried About Running Out of Food in the Last Year: Never true    Ran Out of Food in the Last Year: Never true  Transportation Needs: No Transportation Needs (02/07/2019)   PRAPARE - Hydrologist (Medical): No    Lack of Transportation (Non-Medical): No  Physical Activity: Unknown (02/07/2019)   Exercise Vital Sign    Days of Exercise per Week: 0 days    Minutes of Exercise per Session: Not on file  Stress: Not on file  Social Connections: Unknown (02/07/2019)   Social Connection and Isolation Panel [NHANES]    Frequency of Communication with Friends and Family: More than three times a week    Frequency of Social Gatherings with Friends and Family: Not on file    Attends Religious Services: Not on file    Active Member of Clubs or Organizations: Not on file  Attends Archivist Meetings: Not on file    Marital Status: Married  Intimate Partner Violence: Not At Risk (02/07/2019)   Humiliation, Afraid, Rape, and Kick questionnaire    Fear of Current or Ex-Partner: No    Emotionally Abused: No    Physically Abused: No    Sexually Abused: No   Family Status  Relation Name Status   Mother  Deceased at age 42       ovarian cancer   Father  Alive   Mat Aunt 28 Alive   MGM  Deceased   MGF  Deceased   PGM  Deceased   PGF  Deceased   Brother 61 Alive   Son 109 Alive   Other  Deceased   Neg Hx  (Not Specified)   Family History  Problem Relation Age of Onset   Ovarian cancer Mother 62       deceased 75   Hypertension Father    Skin cancer Paternal Grandmother        deceased  37s   Cancer Paternal Grandfather        unk. primary; deceased 33s   Healthy Brother    Ovarian cancer Other        mat grandmother's sister   Breast cancer Neg Hx    No Known Allergies  Patient Care Team: Bacigalupo, Dionne Bucy, MD as PCP - General (Family Medicine) Clent Jacks, RN as Registered Nurse   Medications: Outpatient Medications Prior to Visit  Medication Sig   acetaminophen (TYLENOL) 325 MG tablet Take 650 mg by mouth every 6 (six) hours as needed.   aspirin EC 81 MG tablet Take 81 mg by mouth daily. Swallow whole. (Patient not taking: Reported on 09/01/2021)   cetirizine (ZYRTEC) 10 MG tablet Take 10 mg by mouth daily.   EPINEPHrine 0.3 mg/0.3 mL IJ SOAJ injection Inject 0.3 mg into the muscle as needed for anaphylaxis.   No facility-administered medications prior to visit.    Review of Systems  All other systems reviewed and are negative.   {Labs  Heme  Chem  Endocrine  Serology  Results Review (optional):23779}  Objective    LMP 08/13/2018 Comment: patient continues to bleed {Show previous vital signs (optional):23777}   Physical Exam  ***  Last depression screening scores    11/10/2020    1:23 PM 10/07/2019    1:33 PM 04/18/2019    2:30 PM  PHQ 2/9 Scores  PHQ - 2 Score 0 0 0  PHQ- 9 Score 2 3    Last fall risk screening    11/10/2020    1:23 PM  [place] in the past year? 0  Number falls in past yr: 0  Injury with Fall? 0  Risk for fall due to : No Fall Risks  Follow up Falls evaluation completed   Last Audit-C alcohol use screening    11/10/2020    1:23 PM  Alcohol Use Disorder Test (AUDIT)  1. How often do you have a drink containing alcohol? 0  2. How many drinks containing alcohol do you have on a typical day when you are drinking? 0  3. How often do you have six or more drinks on one occasion? 0  AUDIT-C Score 0  Alcohol Brief Interventions/Follow-up AUDIT Score <7 follow-up not indicated   A score of 3 or  more in women, and 4 or more in men indicates increased risk for alcohol abuse, EXCEPT if all of the points  are from question 1   No results found for any visits on 10/07/22.  Assessment & Plan    Routine Health Maintenance and Physical Exam  Exercise Activities and Dietary recommendations  Goals      Exercise 150 minutes per week (moderate activity)        Immunization History  Administered Date(s) Administered   Influenza,inj,quad, With Preservative 10/22/2020   Influenza-Unspecified 10/08/2020   PFIZER(Purple Top)SARS-COV-2 Vaccination 03/22/2020, 04/15/2020, 04/27/2020, 11/08/2020, 11/10/2020    Health Maintenance  Topic Date Due   TETANUS/TDAP  Never done   Zoster Vaccines- Shingrix (1 of 2) Never done   COVID-19 Vaccine (6 - Pfizer risk series) 01/05/2021   INFLUENZA VACCINE  07/26/2022   MAMMOGRAM  04/18/2024   COLONOSCOPY (Pts 45-61yr Insurance coverage will need to be confirmed)  10/30/2029   Hepatitis C Screening  Completed   HIV Screening  Completed   HPV VACCINES  Aged Out    Discussed health benefits of physical activity, and encouraged her to engage in regular exercise appropriate for her age and condition.  ***  No follow-ups on file.     {provider attestation***:1}   ALavon Paganini MD  [phone](phone) [phone](fax)  [place]

## 2022-10-10 ENCOUNTER — Ambulatory Visit (INDEPENDENT_AMBULATORY_CARE_PROVIDER_SITE_OTHER): Payer: 59 | Admitting: Internal Medicine

## 2022-10-10 ENCOUNTER — Encounter (INDEPENDENT_AMBULATORY_CARE_PROVIDER_SITE_OTHER): Payer: Self-pay | Admitting: Internal Medicine

## 2022-10-10 VITALS — BP 143/89 | HR 67 | Temp 97.4°F | Ht 67.0 in | Wt 263.8 lb

## 2022-10-10 DIAGNOSIS — R5383 Other fatigue: Secondary | ICD-10-CM

## 2022-10-10 DIAGNOSIS — R030 Elevated blood-pressure reading, without diagnosis of hypertension: Secondary | ICD-10-CM

## 2022-10-10 DIAGNOSIS — R0602 Shortness of breath: Secondary | ICD-10-CM

## 2022-10-10 DIAGNOSIS — Z1331 Encounter for screening for depression: Secondary | ICD-10-CM | POA: Diagnosis not present

## 2022-10-10 DIAGNOSIS — F3289 Other specified depressive episodes: Secondary | ICD-10-CM

## 2022-10-10 DIAGNOSIS — Z6841 Body Mass Index (BMI) 40.0 and over, adult: Secondary | ICD-10-CM | POA: Diagnosis not present

## 2022-10-10 DIAGNOSIS — F32A Depression, unspecified: Secondary | ICD-10-CM | POA: Insufficient documentation

## 2022-10-11 LAB — INSULIN, RANDOM: INSULIN: 11.5 u[IU]/mL (ref 2.6–24.9)

## 2022-10-11 LAB — LIPID PANEL WITH LDL/HDL RATIO
Cholesterol, Total: 201 mg/dL — ABNORMAL HIGH (ref 100–199)
HDL: 60 mg/dL (ref >39)
LDL Chol Calc (NIH): 119 mg/dL — ABNORMAL HIGH (ref 0–99)
LDL/HDL Ratio: 2 ratio (ref 0.0–3.2)
Triglycerides: 125 mg/dL (ref 0–149)
VLDL Cholesterol Cal: 22 mg/dL (ref 5–40)

## 2022-10-12 NOTE — Progress Notes (Signed)
Chief Complaint:   Karen Mann (MR# [id number]) is a 61 y.o. female who presents for evaluation and treatment of Karen and related comorbidities. Current BMI is Body mass index is 41.32 kg/m. Karen Mann has been struggling with her weight for many years and has been unsuccessful in either losing weight, maintaining weight loss, or reaching her healthy weight goal.  Karen Mann is currently in the action stage of change and ready to dedicate time achieving and maintaining a healthier weight. Karen Mann is interested in becoming our patient and working on intensive lifestyle modifications including (but not limited to) diet and exercise for weight loss.  Karen Mann's habits were reviewed today and are as follows: Her family eats meals together, she thinks her family will eat healthier with her, she struggles with family and or coworkers weight loss sabotage, her desired weight loss is 94 lbs, she started gaining weight in 2020, her heaviest weight ever was 309 pounds, she is a picky eater and doesn't like to eat healthier foods, she has significant food cravings issues, she snacks frequently in the evenings, she skips meals frequently, she is frequently drinking liquids with calories, she frequently makes poor food choices, she frequently eats larger portions than normal, and she struggles with emotional eating.  Depression Screen Karen Mann's Food and Mood (modified PHQ-9) score was 19.     10/10/2022    9:10 AM  Depression screen PHQ 2/9  Decreased Interest 3  Down, Depressed, Hopeless 3  PHQ - 2 Score 6  Altered sleeping 2  Tired, decreased energy 3  Change in appetite 3  Feeling bad or failure about yourself  1  Trouble concentrating 1  Moving slowly or fidgety/restless 3  Suicidal thoughts 0  PHQ-9 Score 19  Difficult doing work/chores Somewhat difficult   Subjective:   1. Other fatigue Karen Mann admits to daytime somnolence and admits to waking up still tired. Patient has a history of  symptoms of daytime fatigue and morning fatigue. Karen Mann generally gets 6 or 7 hours of sleep per night, and states that she has nightime awakenings. Snoring is present. Apneic episodes are not present. Epworth Sleepiness Score is 3.  EKG-left ventricular hypertrophy due to right ventricular enlargement.  History of DVT and PE.  2. SOB (shortness of breath) Karen Mann notes increasing shortness of breath with exercising and seems to be worsening over time with weight gain. She notes getting out of breath sooner with activity than she used to. This has not gotten worse recently. Karen Mann denies shortness of breath at rest or orthopnea.  3. Elevated blood pressure reading Reviewed goals of care with the patient.  Karen Mann's previous echocardiogram showed no left ventricular hypertrophy.  Her blood pressure goal is <130/80. I discussed labs with the patient today.   4. Other depression-emotional eating Karen Mann's triggers were identified.  Her mood and food score is 19.  Assessment/Plan:   1. Other fatigue Karen Mann does feel that her weight is causing her energy to be lower than it should be. Fatigue may be related to Karen, depression or many other causes. Labs will be ordered, and in the meanwhile, Karen Mann will focus on self care including making healthy food choices, increasing physical activity and focusing on stress reduction.  - EKG 12-Lead - CBC with Differential/Platelet; Future - Comprehensive metabolic panel; Future - Hemoglobin A1c; Future - Insulin, random - Lipid Panel With LDL/HDL Ratio - TSH; Future - VITAMIN D 25 Hydroxy (Vit-D Deficiency, Fractures); Future - Vitamin B12  2. SOB (  shortness of breath) Karen Mann does feel that she gets out of breath more easily that she used to when she exercises. Karen Mann's shortness of breath appears to be Karen related and exercise induced. She has agreed to work on weight loss and gradually increase exercise to treat her exercise induced shortness of breath. Will  continue to monitor closely.  3. Elevated blood pressure reading Karen Mann is to check her blood pressure twice a day at home and keep a log.  She is to bring in her log to her next office visit.  4. Other depression-emotional eating Karen Mann was counseled on behavioral modification.  She will work on her high-protein and low carbohydrate diet, as this should help with cravings.  We will check insulin levels for resistance.  5. Depression screening Karen Mann had a positive depression screening. Depression is commonly associated with Karen and often results in emotional eating behaviors. We will monitor this closely and work on CBT to help improve the non-hunger eating patterns. Referral to Psychology may be required if no improvement is seen as she continues in our clinic.  6. Class 3 severe Karen without serious comorbidity with body mass index (BMI) of 40.0 to 44.9 in adult, unspecified Karen type (HCC) Karen Mann is currently in the action stage of change and her goal is to continue with weight loss efforts. I recommend Karen Mann begin the structured treatment plan as follows:  She has agreed to the Category 3 Plan.  100 and 200 calorie snack options were given.   Exercise goals: For substantial health benefits, adults should do at least 150 minutes (2 hours and 30 minutes) a week of moderate-intensity, or 75 minutes (1 hour and 15 minutes) a week of vigorous-intensity aerobic physical activity, or an equivalent combination of moderate- and vigorous-intensity aerobic activity. Aerobic activity should be performed in episodes of at least 10 minutes, and preferably, it should be spread throughout the week. Begin walking program. Increase NEAT.   Behavioral modification strategies: increasing lean protein intake, decreasing simple carbohydrates, increasing vegetables, increasing water intake, no skipping meals, meal planning and cooking strategies, keeping healthy foods in the home, better snacking choices,  avoiding temptations, and planning for success.  She was informed of the importance of frequent follow-up visits to maximize her success with intensive lifestyle modifications for her multiple health conditions. She was informed we would discuss her lab results at her next visit unless there is a critical issue that needs to be addressed sooner. Karen Mann agreed to keep her next visit at the agreed upon time to discuss these results.  Objective:   Blood pressure (!) 143/89, pulse 67, temperature (!) 97.4 F (36.3 C), height '5\' 7"'$  (1.702 m), weight 263 lb 12.8 oz (119.7 kg), last menstrual period 08/13/2018, SpO2 98 %. Body mass index is 41.32 kg/m.  EKG: Normal sinus rhythm, rate 70 BPM.  Indirect Calorimeter completed today shows a VO2 of 309 and a REE of 2131.  Her calculated basal metabolic rate is 1093 thus her basal metabolic rate is better than expected.  General: Cooperative, alert, well developed, in no acute distress. HEENT: Conjunctivae and lids unremarkable. Cardiovascular: Regular rhythm.  Lungs: Normal work of breathing. Neurologic: No focal deficits.   Lab Results  Component Value Date   CREATININE 0.75 12/08/2020   BUN 11 12/08/2020   NA 139 12/08/2020   K 3.8 12/08/2020   CL 103 12/08/2020   CO2 21 (L) 12/08/2020   Lab Results  Component Value Date   ALT 15 12/08/2020  AST 18 12/08/2020   ALKPHOS 70 12/08/2020   BILITOT 0.8 12/08/2020   No results found for: "HGBA1C" Lab Results  Component Value Date   INSULIN 11.5 10/10/2022   Lab Results  Component Value Date   TSH 1.140 10/07/2019   Lab Results  Component Value Date   CHOL 201 (H) 10/10/2022   HDL 60 10/10/2022   LDLCALC 119 (H) 10/10/2022   TRIG 125 10/10/2022   CHOLHDL 3.1 12/08/2020   Lab Results  Component Value Date   WBC 5.0 12/08/2020   HGB 12.8 12/08/2020   HCT 38.9 12/08/2020   MCV 87.0 12/08/2020   PLT 259 12/08/2020   Lab Results  Component Value Date   IRON 25 (L) 09/05/2018    TIBC 245 (L) 09/05/2018   FERRITIN 323 (H) 09/05/2018   Attestation Statements:   Reviewed by clinician on day of visit: allergies, medications, problem list, medical history, surgical history, family history, social history, and previous encounter notes.  Time spent on visit including pre-visit chart review and post-visit charting and care was 40 minutes.   Wilhemena Durie, am acting as transcriptionist for Thomes Dinning, MD.  I have reviewed the above documentation for accuracy and completeness, and I agree with the above. -Thomes Dinning, MD

## 2022-10-24 ENCOUNTER — Ambulatory Visit (INDEPENDENT_AMBULATORY_CARE_PROVIDER_SITE_OTHER): Payer: 59 | Admitting: Internal Medicine

## 2022-10-24 ENCOUNTER — Encounter (INDEPENDENT_AMBULATORY_CARE_PROVIDER_SITE_OTHER): Payer: Self-pay | Admitting: Internal Medicine

## 2022-10-24 ENCOUNTER — Encounter (INDEPENDENT_AMBULATORY_CARE_PROVIDER_SITE_OTHER): Payer: Self-pay

## 2022-10-24 VITALS — BP 130/84 | HR 65 | Temp 98.0°F | Wt 260.8 lb

## 2022-10-24 DIAGNOSIS — Z6841 Body Mass Index (BMI) 40.0 and over, adult: Secondary | ICD-10-CM | POA: Diagnosis not present

## 2022-10-24 DIAGNOSIS — R5383 Other fatigue: Secondary | ICD-10-CM | POA: Diagnosis not present

## 2022-10-24 DIAGNOSIS — E559 Vitamin D deficiency, unspecified: Secondary | ICD-10-CM | POA: Diagnosis not present

## 2022-10-25 ENCOUNTER — Other Ambulatory Visit (INDEPENDENT_AMBULATORY_CARE_PROVIDER_SITE_OTHER): Payer: Self-pay | Admitting: Internal Medicine

## 2022-10-25 DIAGNOSIS — E559 Vitamin D deficiency, unspecified: Secondary | ICD-10-CM

## 2022-10-25 LAB — CMP14+EGFR
ALT: 18 IU/L (ref 0–32)
AST: 18 IU/L (ref 0–40)
Albumin/Globulin Ratio: 1.9 (ref 1.2–2.2)
Albumin: 4.6 g/dL (ref 3.8–4.9)
Alkaline Phosphatase: 93 IU/L (ref 44–121)
BUN/Creatinine Ratio: 14 (ref 12–28)
BUN: 13 mg/dL (ref 8–27)
Bilirubin Total: 0.4 mg/dL (ref 0.0–1.2)
CO2: 21 mmol/L (ref 20–29)
Calcium: 9.5 mg/dL (ref 8.7–10.3)
Chloride: 105 mmol/L (ref 96–106)
Creatinine, Ser: 0.96 mg/dL (ref 0.57–1.00)
Globulin, Total: 2.4 g/dL (ref 1.5–4.5)
Glucose: 95 mg/dL (ref 70–99)
Potassium: 4.2 mmol/L (ref 3.5–5.2)
Sodium: 140 mmol/L (ref 134–144)
Total Protein: 7 g/dL (ref 6.0–8.5)
eGFR: 68 mL/min/{1.73_m2} (ref >59)

## 2022-10-25 LAB — CBC WITH DIFFERENTIAL
Basophils Absolute: 0.1 10*3/uL (ref 0.0–0.2)
Basos: 1 %
EOS (ABSOLUTE): 0.2 10*3/uL (ref 0.0–0.4)
Eos: 4 %
Hematocrit: 42.4 % (ref 34.0–46.6)
Hemoglobin: 14 g/dL (ref 11.1–15.9)
Immature Grans (Abs): 0 10*3/uL (ref 0.0–0.1)
Immature Granulocytes: 0 %
Lymphocytes Absolute: 2.2 10*3/uL (ref 0.7–3.1)
Lymphs: 33 %
MCH: 28.7 pg (ref 26.6–33.0)
MCHC: 33 g/dL (ref 31.5–35.7)
MCV: 87 fL (ref 79–97)
Monocytes Absolute: 0.3 10*3/uL (ref 0.1–0.9)
Monocytes: 5 %
Neutrophils Absolute: 3.8 10*3/uL (ref 1.4–7.0)
Neutrophils: 57 %
RBC: 4.88 x10E6/uL (ref 3.77–5.28)
RDW: 13 % (ref 11.7–15.4)
WBC: 6.6 10*3/uL (ref 3.4–10.8)

## 2022-10-25 LAB — VITAMIN D 25 HYDROXY (VIT D DEFICIENCY, FRACTURES): Vit D, 25-Hydroxy: 15.2 ng/mL — ABNORMAL LOW (ref 30.0–100.0)

## 2022-10-25 LAB — TSH: TSH: 1.36 u[IU]/mL (ref 0.450–4.500)

## 2022-10-25 LAB — HEMOGLOBIN A1C
Est. average glucose Bld gHb Est-mCnc: 108 mg/dL
Hgb A1c MFr Bld: 5.4 % (ref 4.8–5.6)

## 2022-10-25 MED ORDER — VITAMIN D (ERGOCALCIFEROL) 1.25 MG (50000 UNIT) PO CAPS: 50000.0000 [IU] | ORAL_CAPSULE | ORAL | 0 refills | Status: AC

## 2022-11-01 NOTE — Progress Notes (Signed)
Chief Complaint:   OBESITY Karen Mann is here to discuss her progress with her obesity treatment plan along with follow-up of her obesity related diagnoses. Cullen is on the Category 3 Plan and states she is following her eating plan approximately 50% of the time. Wenda states she is walking periodically throughout the day 7 times per week.  Today's visit was #: 2 Starting weight: 263 lbs Starting date: 10/10/2022 Today's weight: 260 lbs Today's date: 10/24/2022 Total lbs lost to date: 3 Total lbs lost since last in-office visit: 3  Interim History: Karen Mann is doing well and she had been traveling so she had a few days where she had a lapse.  She notes doing better with making healthier choices and only eating chocolate and a small measurement.  She notes adequate satiety and satiation.  She has increased NEAT at the office.  She is thinking of using the treadmill at home.  She has been journaling and staying under her calories most days.   Subjective:   1. Other fatigue [name] notes fatigue, and labs will be ordered today.   2. Vitamin D deficiency [name]'s last vitamin D level was 15.2.  Her diagnosis is associated with adiposity and may result and leptin resistance and fatigue.  Assessment/Plan:   1. Other fatigue We will check labs today, and we will follow-up at Hydie's next office visit.  - CBC With Differential - Hemoglobin A1c - CMP14+EGFR - TSH  2. Vitamin D deficiency We will check labs today. Karen Mann agreed to start prescription Vitamin D 50,000 IU every week for 90 days.  Prescription was sent to the pharmacy.    - VITAMIN D 25 Hydroxy (Vit-D Deficiency, Fractures)  3. Class 3 severe obesity without serious comorbidity with body mass index (BMI) of 40.0 to 44.9 in adult, unspecified obesity type ([place]) Karen Mann is currently in the action stage of change. As such, her goal is to continue with weight loss efforts. She has agreed to the Category 3 Plan.   We will check  vitamin D and A1c today as the patient is at high risk for diabetes mellitus and vitamin D deficiency.  Exercise goals: Patient will consider starting on the treadmill.  Behavioral modification strategies: decreasing eating out, meal planning and cooking strategies, keeping healthy foods in the home, dealing with family or coworker sabotage, celebration eating strategies, and avoiding temptations.  Karen Mann has agreed to follow-up with our clinic in 2 to 3 weeks. She was informed of the importance of frequent follow-up visits to maximize her success with intensive lifestyle modifications for her multiple health conditions.   Karen Mann was informed we would discuss her lab results at her next visit unless there is a critical issue that needs to be addressed sooner. Karen Mann agreed to keep her next visit at the agreed upon time to discuss these results.  Objective:   Blood pressure 130/84, pulse 65, temperature 98 F (36.7 C), weight 260 lb 12.8 oz (118.3 kg), last menstrual period 08/13/2018, SpO2 99 %. Body mass index is 40.85 kg/m.  General: Cooperative, alert, well developed, in no acute distress. HEENT: Conjunctivae and lids unremarkable. Cardiovascular: Regular rhythm.  Lungs: Normal work of breathing. Neurologic: No focal deficits.   Lab Results  Component Value Date   CREATININE 0.96 10/24/2022   BUN 13 10/24/2022   NA 140 10/24/2022   K 4.2 10/24/2022   CL 105 10/24/2022   CO2 21 10/24/2022   Lab Results  Component Value Date   ALT  18 10/24/2022   AST 18 10/24/2022   ALKPHOS 93 10/24/2022   BILITOT 0.4 10/24/2022   Lab Results  Component Value Date   HGBA1C 5.4 10/24/2022   Lab Results  Component Value Date   INSULIN 11.5 10/10/2022   Lab Results  Component Value Date   TSH 1.360 10/24/2022   Lab Results  Component Value Date   CHOL 201 (H) 10/10/2022   HDL 60 10/10/2022   LDLCALC 119 (H) 10/10/2022   TRIG 125 10/10/2022   CHOLHDL 3.1 12/08/2020   Lab Results   Component Value Date   VD25OH 15.2 (L) 10/24/2022   Lab Results  Component Value Date   WBC 6.6 10/24/2022   HGB 14.0 10/24/2022   HCT 42.4 10/24/2022   MCV 87 10/24/2022   PLT 259 12/08/2020   Lab Results  Component Value Date   IRON 25 (L) 09/05/2018   TIBC 245 (L) 09/05/2018   FERRITIN 323 (H) 09/05/2018   Attestation Statements:   Reviewed by clinician on day of visit: allergies, medications, problem list, medical history, surgical history, family history, social history, and previous encounter notes.  Time spent on visit including pre-visit chart review and post-visit care and charting was 40 minutes.   Wilhemena Durie, am acting as transcriptionist for Thomes Dinning, MD.  I have reviewed the above documentation for accuracy and completeness, and I agree with the above. -Thomes Dinning, MD

## 2022-11-10 ENCOUNTER — Ambulatory Visit (INDEPENDENT_AMBULATORY_CARE_PROVIDER_SITE_OTHER): Payer: 59 | Admitting: Internal Medicine

## 2022-11-10 VITALS — BP 141/84 | HR 85 | Temp 98.5°F | Ht 67.0 in | Wt 257.0 lb

## 2022-11-10 DIAGNOSIS — Z6835 Body mass index (BMI) 35.0-35.9, adult: Secondary | ICD-10-CM

## 2022-11-10 DIAGNOSIS — E559 Vitamin D deficiency, unspecified: Secondary | ICD-10-CM

## 2022-11-10 DIAGNOSIS — E669 Obesity, unspecified: Secondary | ICD-10-CM

## 2022-11-10 DIAGNOSIS — Z6841 Body Mass Index (BMI) 40.0 and over, adult: Secondary | ICD-10-CM | POA: Diagnosis not present

## 2022-11-10 DIAGNOSIS — E7849 Other hyperlipidemia: Secondary | ICD-10-CM

## 2022-11-14 NOTE — Progress Notes (Deleted)
The 10-year ASCVD risk score (Arnett DK, et al., 2019) is: 3.8%   Values used to calculate the score:     Age: 61 years     Sex: Female     Is Non-Hispanic African American: No     Diabetic: No     Tobacco smoker: No     Systolic Blood Pressure: 093 mmHg     Is BP treated: No     HDL Cholesterol: 60 mg/dL     Total Cholesterol: 201 mg/dL

## 2022-11-21 DIAGNOSIS — E7849 Other hyperlipidemia: Secondary | ICD-10-CM | POA: Insufficient documentation

## 2022-11-27 NOTE — Progress Notes (Unsigned)
Chief Complaint:   OBESITY Karen Mann is here to discuss her progress with her obesity treatment plan along with follow-up of her obesity related diagnoses. Karen Mann is on the Category 3 Plan and states she is following her eating plan approximately 75% of the time. Karen Mann states she is not currently exercising.  Today's visit was #: 3 Starting weight: 263 lbs Starting date: 10/10/2022 Today's weight: 257 lbs Today's date: 11/10/2022 Total lbs lost to date: 6 Total lbs lost since last in-office visit: 3  Interim History: Karen Mann is doing well. Pt reports adequate satiety and satiation. She notes increased levels of energy since starting Vit D.  Subjective:   1. Vitamin D deficiency Discussed labs with patient today. Vit D level 15.2, associated with adiposity and leptin resistance.  2. Other hyperlipidemia Discussed labs with patient today. LDL 119. A1c normal. ASCVD risk 3.8%. No CVD.  The 10-year ASCVD risk score (Arnett DK, et al., 2019) is: 3.8%   Values used to calculate the score:     Age: 61 years     Sex: Female     Is Non-Hispanic African American: No     Diabetic: No     Tobacco smoker: No     Systolic Blood Pressure: 768 mmHg     Is BP treated: No     HDL Cholesterol: 60 mg/dL     Total Cholesterol: 201 mg/dL  Assessment/Plan:   1. Vitamin D deficiency Continue Vit D 50,000 IU once weekly for 3-4 months, then recheck levels at that time.  2. Other hyperlipidemia Continue ***MNT??***. No need for statin therapy at present risk category.  3. Obesity with current BMI 40.4 Karen Mann is currently in the action stage of change. As such, her goal is to continue with weight loss efforts. She has agreed to the Category 3 Plan.   Exercise goals:  Start strength training program.  Behavioral modification strategies: increasing lean protein intake, decreasing simple carbohydrates, increasing water intake, no skipping meals, meal planning and cooking strategies, holiday  eating strategies , and planning for success.  Karen Mann has agreed to follow-up with our clinic in 2-3 weeks. She was informed of the importance of frequent follow-up visits to maximize her success with intensive lifestyle modifications for her multiple health conditions.   Objective:   Blood pressure (!) 141/84, pulse 85, temperature 98.5 F (36.9 C), height '5\' 7"'$  (1.702 m), weight 257 lb (116.6 kg), last menstrual period 08/13/2018, SpO2 99 %. Body mass index is 40.25 kg/m.  General: Cooperative, alert, well developed, in no acute distress. HEENT: Conjunctivae and lids unremarkable. Cardiovascular: Regular rhythm.  Lungs: Normal work of breathing. Neurologic: No focal deficits.   Lab Results  Component Value Date   CREATININE 0.96 10/24/2022   BUN 13 10/24/2022   NA 140 10/24/2022   K 4.2 10/24/2022   CL 105 10/24/2022   CO2 21 10/24/2022   Lab Results  Component Value Date   ALT 18 10/24/2022   AST 18 10/24/2022   ALKPHOS 93 10/24/2022   BILITOT 0.4 10/24/2022   Lab Results  Component Value Date   HGBA1C 5.4 10/24/2022   Lab Results  Component Value Date   INSULIN 11.5 10/10/2022   Lab Results  Component Value Date   TSH 1.360 10/24/2022   Lab Results  Component Value Date   CHOL 201 (H) 10/10/2022   HDL 60 10/10/2022   LDLCALC 119 (H) 10/10/2022   TRIG 125 10/10/2022   CHOLHDL 3.1 12/08/2020  Lab Results  Component Value Date   VD25OH 15.2 (L) 10/24/2022   Lab Results  Component Value Date   WBC 6.6 10/24/2022   HGB 14.0 10/24/2022   HCT 42.4 10/24/2022   MCV 87 10/24/2022   PLT 259 12/08/2020   Lab Results  Component Value Date   IRON 25 (L) 09/05/2018   TIBC 245 (L) 09/05/2018   FERRITIN 323 (H) 09/05/2018    Attestation Statements:   Reviewed by clinician on day of visit: allergies, medications, problem list, medical history, surgical history, family history, social history, and previous encounter notes.  Time spent on visit including  pre-visit chart review and post-visit care and charting was 20 minutes.   I, Kathlene November, BS, CMA, am acting as transcriptionist for Thomes Dinning, MD.  I have reviewed the above documentation for accuracy and completeness, and I agree with the above. -  ***

## 2022-12-08 ENCOUNTER — Ambulatory Visit (INDEPENDENT_AMBULATORY_CARE_PROVIDER_SITE_OTHER): Payer: 59 | Admitting: Internal Medicine

## 2023-02-09 ENCOUNTER — Encounter: Payer: Self-pay | Admitting: Oncology

## 2023-03-15 ENCOUNTER — Inpatient Hospital Stay: Payer: Self-pay

## 2023-03-15 ENCOUNTER — Inpatient Hospital Stay: Payer: Self-pay | Attending: Obstetrics and Gynecology
